# Patient Record
Sex: Male | Born: 1940 | Race: White | Hispanic: No | Marital: Married | Smoking: Former smoker
Health system: Southern US, Community
[De-identification: ages and names within clinical notes are randomized; demographics above are authoritative.]

## PROBLEM LIST (undated history)

## (undated) DIAGNOSIS — R569 Unspecified convulsions: Secondary | ICD-10-CM

## (undated) DIAGNOSIS — K635 Polyp of colon: Secondary | ICD-10-CM

## (undated) DIAGNOSIS — H409 Unspecified glaucoma: Secondary | ICD-10-CM

## (undated) DIAGNOSIS — M1990 Unspecified osteoarthritis, unspecified site: Secondary | ICD-10-CM

## (undated) DIAGNOSIS — T7840XA Allergy, unspecified, initial encounter: Secondary | ICD-10-CM

## (undated) DIAGNOSIS — C801 Malignant (primary) neoplasm, unspecified: Secondary | ICD-10-CM

## (undated) DIAGNOSIS — I10 Essential (primary) hypertension: Secondary | ICD-10-CM

## (undated) DIAGNOSIS — H269 Unspecified cataract: Secondary | ICD-10-CM

## (undated) DIAGNOSIS — K219 Gastro-esophageal reflux disease without esophagitis: Secondary | ICD-10-CM

## (undated) DIAGNOSIS — E785 Hyperlipidemia, unspecified: Secondary | ICD-10-CM

## (undated) DIAGNOSIS — M189 Osteoarthritis of first carpometacarpal joint, unspecified: Secondary | ICD-10-CM

## (undated) HISTORY — DX: Hyperlipidemia, unspecified: E78.5

## (undated) HISTORY — PX: COLONOSCOPY: SHX174

## (undated) HISTORY — DX: Unspecified glaucoma: H40.9

## (undated) HISTORY — PX: CHOLECYSTECTOMY: SHX55

## (undated) HISTORY — DX: Unspecified convulsions: R56.9

## (undated) HISTORY — DX: Polyp of colon: K63.5

## (undated) HISTORY — DX: Essential (primary) hypertension: I10

## (undated) HISTORY — DX: Unspecified osteoarthritis, unspecified site: M19.90

## (undated) HISTORY — DX: Gastro-esophageal reflux disease without esophagitis: K21.9

## (undated) HISTORY — PX: POLYPECTOMY: SHX149

## (undated) HISTORY — DX: Unspecified cataract: H26.9

## (undated) HISTORY — DX: Allergy, unspecified, initial encounter: T78.40XA

---

## 1999-10-30 ENCOUNTER — Ambulatory Visit (HOSPITAL_BASED_OUTPATIENT_CLINIC_OR_DEPARTMENT_OTHER): Admission: RE | Admit: 1999-10-30 | Discharge: 1999-10-30 | Payer: Self-pay | Admitting: Otolaryngology

## 2001-02-08 ENCOUNTER — Ambulatory Visit (HOSPITAL_COMMUNITY): Admission: RE | Admit: 2001-02-08 | Discharge: 2001-02-08 | Payer: Self-pay | Admitting: Gastroenterology

## 2001-03-16 HISTORY — PX: KNEE ARTHROSCOPY: SUR90

## 2011-11-17 ENCOUNTER — Encounter: Payer: Self-pay | Admitting: Gastroenterology

## 2012-01-05 ENCOUNTER — Ambulatory Visit (AMBULATORY_SURGERY_CENTER): Payer: Medicare Other | Admitting: *Deleted

## 2012-01-05 VITALS — Ht 69.0 in | Wt 180.0 lb

## 2012-01-05 DIAGNOSIS — Z1211 Encounter for screening for malignant neoplasm of colon: Secondary | ICD-10-CM

## 2012-01-05 MED ORDER — MOVIPREP 100 G PO SOLR: ORAL | Status: DC

## 2012-01-19 ENCOUNTER — Encounter: Payer: Self-pay | Admitting: Gastroenterology

## 2012-01-19 ENCOUNTER — Ambulatory Visit (AMBULATORY_SURGERY_CENTER): Payer: Medicare Other | Admitting: Gastroenterology

## 2012-01-19 VITALS — BP 138/72 | HR 54 | Temp 96.8°F | Resp 14 | Ht 69.0 in | Wt 180.0 lb

## 2012-01-19 DIAGNOSIS — Z1211 Encounter for screening for malignant neoplasm of colon: Secondary | ICD-10-CM

## 2012-01-19 DIAGNOSIS — Z86010 Personal history of colonic polyps: Secondary | ICD-10-CM

## 2012-01-19 MED ORDER — SODIUM CHLORIDE 0.9 % IV SOLN: 500.0000 mL | INTRAVENOUS | Status: DC

## 2012-01-19 NOTE — Op Note (Signed)
[facility] [address] [place], [address]   COLONOSCOPY PROCEDURE REPORT  PATIENT: Brian, Castillo.  MR#: [id number] BIRTHDATE: [birth year] , 71  yrs. old GENDER: Male ENDOSCOPIST: Meryl Dare, MD, [facility] REFERRED GN:FAOZ Timothy Lasso, M.D. 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 MEDICATIONS: MAC sedation, administered by CRNA and propofol (Diprivan) 120mg  IV DESCRIPTION OF PROCEDURE:   After the risks benefits and alternatives of the procedure were thoroughly explained, informed consent was obtained.  A digital rectal exam revealed no abnormalities of the rectum.   The LB [id number]  endoscope was introduced through the anus and advanced to the cecum, which was identified by both the appendix and ileocecal valve. No adverse events experienced.   The quality of the prep was good, using MoviPrep  The instrument was then slowly withdrawn as the colon was fully examined.   COLON FINDINGS: A normal appearing cecum, ileocecal valve, and appendiceal orifice were identified.  The ascending, hepatic flexure, transverse, splenic flexure, descending, sigmoid colon and rectum appeared unremarkable.  No polyps or cancers were seen. Retroflexed views revealed small internal hemorrhoids. The time to cecum=3 minutes 01 seconds.  Withdrawal time=11 minutes 30 seconds. The scope was withdrawn and the procedure completed.  COMPLICATIONS: There were no complications.  ENDOSCOPIC IMPRESSION: 1.  Normal colon 2.  Small internal hemorrhoids  RECOMMENDATIONS: 1.  Repeat Colonoscopy in 5 years.   eSigned:  Meryl Dare, MD, [facility] 01/19/2012 10:15 AM

## 2012-01-19 NOTE — Progress Notes (Signed)
Patient did not experience any of the following events: a burn prior to discharge; a fall within the facility; wrong site/side/patient/procedure/implant event; or a hospital transfer or hospital admission upon discharge from the facility. (G8907) Patient did not have preoperative order for IV antibiotic SSI prophylaxis. (G8918)  

## 2012-01-19 NOTE — Progress Notes (Signed)
NAC noted-VSS-Toleratted procedure well. Arrousable.

## 2012-01-19 NOTE — Patient Instructions (Addendum)
YOU HAD AN ENDOSCOPIC PROCEDURE TODAY AT THE [facility]: Refer to the procedure report that was given to you for any specific questions about what was found during the examination.  If the procedure report does not answer your questions, please call your gastroenterologist to clarify.  If you requested that your care partner not be given the details of your procedure findings, then the procedure report has been included in a sealed envelope for you to review at your convenience later.  YOU SHOULD EXPECT: Some feelings of bloating in the abdomen. Passage of more gas than usual.  Walking can help get rid of the air that was put into your GI tract during the procedure and reduce the bloating. If you had a lower endoscopy (such as a colonoscopy or flexible sigmoidoscopy) you may notice spotting of blood in your stool or on the toilet paper. If you underwent a bowel prep for your procedure, then you may not have a normal bowel movement for a few days.  DIET: Your first meal following the procedure should be a light meal and then it is ok to progress to your normal diet.  A half-sandwich or bowl of soup is an example of a good first meal.  Heavy or fried foods are harder to digest and may make you feel nauseous or bloated.  Likewise meals heavy in dairy and vegetables can cause extra gas to form and this can also increase the bloating.  Drink plenty of fluids but you should avoid alcoholic beverages for 24 hours.  ACTIVITY: Your care partner should take you home directly after the procedure.  You should plan to take it easy, moving slowly for the rest of the day.  You can resume normal activity the day after the procedure however you should NOT DRIVE or use heavy machinery for 24 hours (because of the sedation medicines used during the test).    SYMPTOMS TO REPORT IMMEDIATELY: A gastroenterologist can be reached at any hour.  During normal business hours, 8:30 AM to 5:00 PM Monday through Friday,  call [phone].  After hours and on weekends, please call the GI answering service at [phone] who will take a message and have the physician on call contact you.   Following lower endoscopy (colonoscopy or flexible sigmoidoscopy):  Excessive amounts of blood in the stool  Significant tenderness or worsening of abdominal pains  Swelling of the abdomen that is new, acute  Fever of 100F or higher  Following upper endoscopy (EGD)  Vomiting of blood or coffee ground material  New chest pain or pain under the shoulder blades  Painful or persistently difficult swallowing  New shortness of breath  Fever of 100F or higher  Black, tarry-looking stools  FOLLOW UP: If any biopsies were taken you will be contacted by phone or by letter within the next 1-3 weeks.  Call your gastroenterologist if you have not heard about the biopsies in 3 weeks.  Our staff will call the home number listed on your records the next business day following your procedure to check on you and address any questions or concerns that you may have at that time regarding the information given to you following your procedure. This is a courtesy call and so if there is no answer at the home number and we have not heard from you through the emergency physician on call, we will assume that you have returned to your regular daily activities without incident.  SIGNATURES/CONFIDENTIALITY: You and/or your care   partner have signed paperwork which will be entered into your electronic medical record.  These signatures attest to the fact that that the information above on your After Visit Summary has been reviewed and is understood.  Full responsibility of the confidentiality of this discharge information lies with you and/or your care-partner.  

## 2012-01-20 ENCOUNTER — Telehealth: Payer: Self-pay | Admitting: *Deleted

## 2012-01-20 NOTE — Telephone Encounter (Signed)
  Follow up Call-  Call back number 01/19/2012  Post procedure Call Back phone  # [phone]  Permission to leave phone message Yes     Patient questions:  Do you have a fever, pain , or abdominal swelling? no Pain Score  0 *  Have you tolerated food without any problems? yes  Have you been able to return to your normal activities? yes  Do you have any questions about your discharge instructions: Diet   no Medications  no Follow up visit  no  Do you have questions or concerns about your Care? no  Actions: * If pain score is 4 or above: No action needed, pain <4.

## 2014-08-22 ENCOUNTER — Encounter (HOSPITAL_BASED_OUTPATIENT_CLINIC_OR_DEPARTMENT_OTHER)
Admission: RE | Admit: 2014-08-22 | Discharge: 2014-08-22 | Disposition: A | Discharge status: Home / Self Care | Payer: Medicare Other | Source: Ambulatory Visit | Attending: Orthopedic Surgery | Admitting: Orthopedic Surgery

## 2014-08-22 ENCOUNTER — Encounter (HOSPITAL_BASED_OUTPATIENT_CLINIC_OR_DEPARTMENT_OTHER): Payer: Self-pay | Admitting: *Deleted

## 2014-08-22 DIAGNOSIS — M19049 Primary osteoarthritis, unspecified hand: Secondary | ICD-10-CM | POA: Insufficient documentation

## 2014-08-22 DIAGNOSIS — Z01818 Encounter for other preprocedural examination: Secondary | ICD-10-CM | POA: Insufficient documentation

## 2014-08-22 LAB — BASIC METABOLIC PANEL
ANION GAP: 4 — AB (ref 5–15)
BUN: 17 mg/dL (ref 6–20)
CHLORIDE: 105 mmol/L (ref 101–111)
CO2: 28 mmol/L (ref 22–32)
Calcium: 8.8 mg/dL — ABNORMAL LOW (ref 8.9–10.3)
Creatinine, Ser: 0.83 mg/dL (ref 0.61–1.24)
GFR calc Af Amer: >60 mL/min (ref >60)
GFR calc non Af Amer: >60 mL/min (ref >60)
GLUCOSE: 93 mg/dL (ref 65–99)
Potassium: 4.5 mmol/L (ref 3.5–5.1)
SODIUM: 137 mmol/L (ref 135–145)

## 2014-08-24 ENCOUNTER — Ambulatory Visit (HOSPITAL_BASED_OUTPATIENT_CLINIC_OR_DEPARTMENT_OTHER): Payer: Medicare Other | Admitting: Anesthesiology

## 2014-08-24 ENCOUNTER — Ambulatory Visit (HOSPITAL_BASED_OUTPATIENT_CLINIC_OR_DEPARTMENT_OTHER)
Admission: RE | Admit: 2014-08-24 | Discharge: 2014-08-25 | Disposition: A | Discharge status: Home / Self Care | Payer: Medicare Other | Source: Ambulatory Visit | Attending: Orthopedic Surgery | Admitting: Orthopedic Surgery

## 2014-08-24 ENCOUNTER — Encounter (HOSPITAL_BASED_OUTPATIENT_CLINIC_OR_DEPARTMENT_OTHER)
Admission: RE | Disposition: A | Discharge status: Home / Self Care | Payer: Self-pay | Source: Ambulatory Visit | Attending: Orthopedic Surgery

## 2014-08-24 ENCOUNTER — Encounter (HOSPITAL_BASED_OUTPATIENT_CLINIC_OR_DEPARTMENT_OTHER): Payer: Self-pay

## 2014-08-24 DIAGNOSIS — Z79899 Other long term (current) drug therapy: Secondary | ICD-10-CM | POA: Insufficient documentation

## 2014-08-24 DIAGNOSIS — M1811 Unilateral primary osteoarthritis of first carpometacarpal joint, right hand: Secondary | ICD-10-CM | POA: Diagnosis not present

## 2014-08-24 DIAGNOSIS — E785 Hyperlipidemia, unspecified: Secondary | ICD-10-CM | POA: Insufficient documentation

## 2014-08-24 DIAGNOSIS — I10 Essential (primary) hypertension: Secondary | ICD-10-CM | POA: Insufficient documentation

## 2014-08-24 DIAGNOSIS — Z888 Allergy status to other drugs, medicaments and biological substances status: Secondary | ICD-10-CM | POA: Insufficient documentation

## 2014-08-24 DIAGNOSIS — H409 Unspecified glaucoma: Secondary | ICD-10-CM | POA: Diagnosis not present

## 2014-08-24 DIAGNOSIS — Z7982 Long term (current) use of aspirin: Secondary | ICD-10-CM | POA: Insufficient documentation

## 2014-08-24 DIAGNOSIS — Z87891 Personal history of nicotine dependence: Secondary | ICD-10-CM | POA: Insufficient documentation

## 2014-08-24 DIAGNOSIS — M1810 Unilateral primary osteoarthritis of first carpometacarpal joint, unspecified hand: Secondary | ICD-10-CM | POA: Diagnosis present

## 2014-08-24 HISTORY — DX: Osteoarthritis of first carpometacarpal joint, unspecified: M18.9

## 2014-08-24 HISTORY — PX: CARPOMETACARPEL SUSPENSION PLASTY: SHX5005

## 2014-08-24 LAB — POCT HEMOGLOBIN-HEMACUE: HEMOGLOBIN: 14.1 g/dL (ref 13.0–17.0)

## 2014-08-24 SURGERY — CARPOMETACARPEL (CMC) SUSPENSION PLASTY
Anesthesia: Regional | Site: Thumb | Laterality: Right

## 2014-08-24 MED ORDER — CEFAZOLIN SODIUM-DEXTROSE 2-3 GM-% IV SOLR
2.0000 g | Freq: Three times a day (TID) | INTRAVENOUS | Status: DC
  Administered 2014-08-24: 2 g via INTRAVENOUS

## 2014-08-24 MED ORDER — OXYCODONE HCL 5 MG PO TABS: 10.0000 mg | ORAL_TABLET | ORAL | Status: DC | PRN

## 2014-08-24 MED ORDER — FENTANYL CITRATE (PF) 100 MCG/2ML IJ SOLN
INTRAMUSCULAR | Status: AC
  Filled 2014-08-24: qty 2

## 2014-08-24 MED ORDER — ALPRAZOLAM 0.5 MG PO TABS
0.5000 mg | ORAL_TABLET | Freq: Four times a day (QID) | ORAL | Status: DC | PRN
  Filled 2014-08-24: qty 2

## 2014-08-24 MED ORDER — LACTATED RINGERS IV SOLN
INTRAVENOUS | Status: DC
  Administered 2014-08-24 (×2): via INTRAVENOUS

## 2014-08-24 MED ORDER — METHOCARBAMOL 500 MG PO TABS
500.0000 mg | ORAL_TABLET | Freq: Four times a day (QID) | ORAL | Status: DC | PRN
  Administered 2014-08-25: 500 mg via ORAL
  Filled 2014-08-24: qty 1

## 2014-08-24 MED ORDER — VITAMIN C 500 MG PO TABS: 1000.0000 mg | ORAL_TABLET | Freq: Every day | ORAL | Status: DC

## 2014-08-24 MED ORDER — MIDAZOLAM HCL 2 MG/2ML IJ SOLN
INTRAMUSCULAR | Status: AC
  Filled 2014-08-24: qty 2

## 2014-08-24 MED ORDER — MORPHINE SULFATE 2 MG/ML IJ SOLN
1.0000 mg | INTRAMUSCULAR | Status: DC | PRN
  Administered 2014-08-25 (×2): 1 mg via INTRAVENOUS
  Filled 2014-08-24: qty 1

## 2014-08-24 MED ORDER — CEFAZOLIN SODIUM 1-5 GM-% IV SOLN
1.0000 g | Freq: Three times a day (TID) | INTRAVENOUS | Status: DC
  Administered 2014-08-24 – 2014-08-25 (×2): 1 g via INTRAVENOUS
  Filled 2014-08-24 (×2): qty 50

## 2014-08-24 MED ORDER — GLYCOPYRROLATE 0.2 MG/ML IJ SOLN: 0.2000 mg | Freq: Once | INTRAMUSCULAR | Status: DC | PRN

## 2014-08-24 MED ORDER — MIDAZOLAM HCL 2 MG/2ML IJ SOLN
1.0000 mg | INTRAMUSCULAR | Status: DC | PRN
  Administered 2014-08-24: 2 mg via INTRAVENOUS

## 2014-08-24 MED ORDER — PROPOFOL 10 MG/ML IV BOLUS
INTRAVENOUS | Status: AC
Start: 2014-08-24 — End: 2014-08-24
  Filled 2014-08-24: qty 20

## 2014-08-24 MED ORDER — FENTANYL CITRATE (PF) 100 MCG/2ML IJ SOLN
50.0000 ug | INTRAMUSCULAR | Status: DC | PRN
  Administered 2014-08-24: 100 ug via INTRAVENOUS
  Administered 2014-08-24: 25 ug via INTRAVENOUS

## 2014-08-24 MED ORDER — BUPIVACAINE HCL (PF) 0.25 % IJ SOLN
INTRAMUSCULAR | Status: AC
Start: 2014-08-24 — End: 2014-08-24
  Filled 2014-08-24: qty 90

## 2014-08-24 MED ORDER — METHOCARBAMOL 1000 MG/10ML IJ SOLN: 500.0000 mg | Freq: Four times a day (QID) | INTRAVENOUS | Status: DC | PRN

## 2014-08-24 MED ORDER — CEFAZOLIN SODIUM-DEXTROSE 2-3 GM-% IV SOLR
INTRAVENOUS | Status: AC
Start: 2014-08-24 — End: 2014-08-24
  Filled 2014-08-24: qty 50

## 2014-08-24 MED ORDER — LACTATED RINGERS IV SOLN
INTRAVENOUS | Status: DC
  Administered 2014-08-24 (×2): via INTRAVENOUS

## 2014-08-24 MED ORDER — PHENYTOIN SODIUM EXTENDED 100 MG PO CAPS: 200.0000 mg | ORAL_CAPSULE | Freq: Two times a day (BID) | ORAL | Status: DC

## 2014-08-24 MED ORDER — OXYCODONE HCL 5 MG PO TABS
5.0000 mg | ORAL_TABLET | ORAL | Status: DC | PRN
  Administered 2014-08-25: 5 mg via ORAL
  Administered 2014-08-25: 10 mg via ORAL
  Administered 2014-08-25: 5 mg via ORAL
  Filled 2014-08-24 (×2): qty 2
  Filled 2014-08-24: qty 1

## 2014-08-24 MED ORDER — PROPOFOL INFUSION 10 MG/ML OPTIME
INTRAVENOUS | Status: DC | PRN
  Administered 2014-08-24: 75 ug/kg/min via INTRAVENOUS

## 2014-08-24 MED ORDER — FAMOTIDINE 20 MG PO TABS: 20.0000 mg | ORAL_TABLET | Freq: Two times a day (BID) | ORAL | Status: DC | PRN

## 2014-08-24 MED ORDER — BUPIVACAINE-EPINEPHRINE (PF) 0.5% -1:200000 IJ SOLN
INTRAMUSCULAR | Status: DC | PRN
  Administered 2014-08-24: 30 mL via PERINEURAL

## 2014-08-24 MED ORDER — METHOCARBAMOL 500 MG PO TABS: 500.0000 mg | ORAL_TABLET | Freq: Four times a day (QID) | ORAL | Status: DC

## 2014-08-24 MED ORDER — LIDOCAINE HCL (CARDIAC) 20 MG/ML IV SOLN
INTRAVENOUS | Status: DC | PRN
  Administered 2014-08-24: 50 mg via INTRAVENOUS

## 2014-08-24 MED ORDER — LATANOPROST 0.005 % OP SOLN: 1.0000 [drp] | Freq: Every day | OPHTHALMIC | Status: DC

## 2014-08-24 MED ORDER — FENTANYL CITRATE (PF) 100 MCG/2ML IJ SOLN
INTRAMUSCULAR | Status: AC
Start: 2014-08-24 — End: 2014-08-24
  Filled 2014-08-24: qty 4

## 2014-08-24 SURGICAL SUPPLY — 72 items
ANCH SUT SWLK 15.8X3.5 (Anchor) ×1 IMPLANT
ANCHOR SUTBIO SWIVELK 3.5X15.8 (Anchor) ×1 IMPLANT
BANDAGE ELASTIC 3 VELCRO ST LF (GAUZE/BANDAGES/DRESSINGS) ×2 IMPLANT
BIT DRILL CANN 2.7X625 NONSTRL (BIT) ×1 IMPLANT
BIT DRILL CANN 3.5X160 QC (BIT) ×1 IMPLANT
BLADE CLIPPER SURG (BLADE) ×2 IMPLANT
BLADE SURG 15 STRL LF DISP TIS (BLADE) ×3 IMPLANT
BLADE SURG 15 STRL SS (BLADE) ×6
BNDG CONFORM 3 STRL LF (GAUZE/BANDAGES/DRESSINGS) ×2 IMPLANT
BNDG GAUZE ELAST 4 BULKY (GAUZE/BANDAGES/DRESSINGS) ×2 IMPLANT
BRUSH SCRUB EZ PLAIN DRY (MISCELLANEOUS) ×2 IMPLANT
CANISTER SUCT 1200ML W/VALVE (MISCELLANEOUS) ×2 IMPLANT
CORDS BIPOLAR (ELECTRODE) ×2 IMPLANT
COVER BACK TABLE 60X90IN (DRAPES) ×2 IMPLANT
COVER MAYO STAND STRL (DRAPES) ×2 IMPLANT
CUFF TOURNIQUET SINGLE 18IN (TOURNIQUET CUFF) ×1 IMPLANT
DECANTER SPIKE VIAL GLASS SM (MISCELLANEOUS) IMPLANT
DRAIN TLS ROUND 10FR (DRAIN) IMPLANT
DRAPE EXTREMITY T 121X128X90 (DRAPE) ×2 IMPLANT
DRAPE OEC MINIVIEW 54X84 (DRAPES) IMPLANT
DRAPE SURG 17X23 STRL (DRAPES) ×2 IMPLANT
DRSG EMULSION OIL 3X3 NADH (GAUZE/BANDAGES/DRESSINGS) ×2 IMPLANT
FIBERLOOP 2 0 (SUTURE) ×2 IMPLANT
GAUZE SPONGE 4X4 12PLY STRL (GAUZE/BANDAGES/DRESSINGS) ×2 IMPLANT
GAUZE SPONGE 4X4 16PLY XRAY LF (GAUZE/BANDAGES/DRESSINGS) IMPLANT
GLOVE BIO SURGEON STRL SZ 6.5 (GLOVE) ×2 IMPLANT
GLOVE BIOGEL M STRL SZ7.5 (GLOVE) ×2 IMPLANT
GLOVE BIOGEL PI IND STRL 7.0 (GLOVE) IMPLANT
GLOVE BIOGEL PI INDICATOR 7.0 (GLOVE) ×3
GLOVE SS BIOGEL STRL SZ 8 (GLOVE) ×1 IMPLANT
GLOVE SUPERSENSE BIOGEL SZ 8 (GLOVE) ×1
GOWN STRL REUS W/ TWL LRG LVL3 (GOWN DISPOSABLE) ×1 IMPLANT
GOWN STRL REUS W/ TWL XL LVL3 (GOWN DISPOSABLE) ×1 IMPLANT
GOWN STRL REUS W/TWL LRG LVL3 (GOWN DISPOSABLE) ×4
GOWN STRL REUS W/TWL XL LVL3 (GOWN DISPOSABLE) ×2
GUIDEWIRE THREADED 150MM (WIRE) ×2 IMPLANT
NDL HYPO 25X1 1.5 SAFETY (NEEDLE) ×1 IMPLANT
NEEDLE HYPO 22GX1.5 SAFETY (NEEDLE) IMPLANT
NEEDLE HYPO 25X1 1.5 SAFETY (NEEDLE) ×2 IMPLANT
NS IRRIG 1000ML POUR BTL (IV SOLUTION) ×2 IMPLANT
PACK BASIN DAY SURGERY FS (CUSTOM PROCEDURE TRAY) ×2 IMPLANT
PAD CAST 3X4 CTTN HI CHSV (CAST SUPPLIES) ×2 IMPLANT
PADDING CAST ABS 3INX4YD NS (CAST SUPPLIES) ×1
PADDING CAST ABS 4INX4YD NS (CAST SUPPLIES) ×1
PADDING CAST ABS COTTON 3X4 (CAST SUPPLIES) ×1 IMPLANT
PADDING CAST ABS COTTON 4X4 ST (CAST SUPPLIES) ×1 IMPLANT
PADDING CAST COTTON 3X4 STRL (CAST SUPPLIES) ×4
PASSER SUT SWANSON 36MM LOOP (INSTRUMENTS) ×1 IMPLANT
SHEET MEDIUM DRAPE 40X70 STRL (DRAPES) ×1 IMPLANT
SPLINT FIBERGLASS 3X35 (CAST SUPPLIES) ×1 IMPLANT
SPLINT PLASTER CAST XFAST 3X15 (CAST SUPPLIES) IMPLANT
SPLINT PLASTER XTRA FASTSET 3X (CAST SUPPLIES)
SPONGE SURGIFOAM ABS GEL 12-7 (HEMOSTASIS) IMPLANT
STOCKINETTE 4X48 STRL (DRAPES) ×2 IMPLANT
STOCKINETTE SYNTHETIC 3 UNSTER (CAST SUPPLIES) ×2 IMPLANT
SUCTION FRAZIER TIP 10 FR DISP (SUCTIONS) ×2 IMPLANT
SUT BONE WAX W31G (SUTURE) IMPLANT
SUT FIBERWIRE 3-0 18 TAPR NDL (SUTURE) ×4
SUT FIBERWIRE 4-0 18 TAPR NDL (SUTURE)
SUT PROLENE 4 0 PS 2 18 (SUTURE) ×4 IMPLANT
SUT VIC AB 4-0 P-3 18XBRD (SUTURE) IMPLANT
SUT VIC AB 4-0 P3 18 (SUTURE)
SUTURE FIBERWR 3-0 18 TAPR NDL (SUTURE) ×2 IMPLANT
SUTURE FIBERWR 4-0 18 TAPR NDL (SUTURE) IMPLANT
SYR BULB 3OZ (MISCELLANEOUS) ×2 IMPLANT
SYR CONTROL 10ML LL (SYRINGE) ×3 IMPLANT
TAPE SURG TRANSPORE 1 IN (GAUZE/BANDAGES/DRESSINGS) ×1 IMPLANT
TAPE SURGICAL TRANSPORE 1 IN (GAUZE/BANDAGES/DRESSINGS) ×1
TOWEL OR 17X24 6PK STRL BLUE (TOWEL DISPOSABLE) ×4 IMPLANT
TOWEL OR NON WOVEN STRL DISP B (DISPOSABLE) ×2 IMPLANT
TUBE CONNECTING 20X1/4 (TUBING) ×2 IMPLANT
UNDERPAD 30X30 (UNDERPADS AND DIAPERS) ×2 IMPLANT

## 2014-08-24 NOTE — Anesthesia Postprocedure Evaluation (Signed)
Anesthesia Post Note  Patient: Brian Castillo  Procedure(s) Performed: Procedure(s) (LRB): RIGHT CMC ARTHOPLASTY WITH DOUBLE TENDON TRANSFER AND REPAIR  RECONSTRUCTION  (Right)  Anesthesia type: regional  Patient location: PACU  Post pain: Pain level controlled  Post assessment: Patient's Cardiovascular Status Stable  Last Vitals:  Filed Vitals:   08/24/14 1030  BP: 138/64  Pulse: 52  Temp: 36.2 C  Resp: 14    Post vital signs: Reviewed and stable  Level of consciousness: awake  Complications: No apparent anesthesia complications

## 2014-08-24 NOTE — Op Note (Signed)
See dictation #[id number] Amedeo Plenty MD

## 2014-08-24 NOTE — Discharge Instructions (Signed)
Keep bandage clean and dry.  Call for any problems.  No smoking.  Criteria for driving a car: you should be off your pain medicine for 7-8 hours, able to drive one handed(confident), thinking clearly and feeling able in your judgement to drive. Continue elevation as it will decrease swelling.  If instructed by MD move your fingers within the confines of the bandage/splint.  Use ice if instructed by your MD. Call immediately for any sudden loss of feeling in your hand/arm or change in functional abilities of the extremity.We recommend that you to take vitamin C 1000 mg a day to promote healing. We also recommend that if you require  pain medicine that you take a stool softener to prevent constipation as most pain medicines will have constipation side effects. We recommend either Peri-Colace or Senokot and recommend that you also consider adding MiraLAX to prevent the constipation affects from pain medicine if you are required to use them. These medicines are over the counter and maybe purchased at a local pharmacy. A cup of yogurt and a probiotic can also be helpful during the recovery process as the medicines can disrupt your intestinal environment.

## 2014-08-24 NOTE — Progress Notes (Signed)
Assisted Dr. Ossey with right, ultrasound guided, supraclavicular block. Side rails up, monitors on throughout procedure. See vital signs in flow sheet. Tolerated Procedure well. 

## 2014-08-24 NOTE — Transfer of Care (Signed)
Immediate Anesthesia Transfer of Care Note  Patient: Brian Castillo  Procedure(s) Performed: Procedure(s): RIGHT CMC ARTHOPLASTY WITH DOUBLE TENDON TRANSFER AND REPAIR  RECONSTRUCTION  (Right)  Patient Location: PACU  Anesthesia Type:MAC combined with regional for post-op pain  Level of Consciousness: awake, alert  and oriented  Airway & Oxygen Therapy: Patient Spontanous Breathing and Patient connected to face mask oxygen  Post-op Assessment: Report given to RN and Post -op Vital signs reviewed and stable  Post vital signs: Reviewed and stable  Last Vitals:  Filed Vitals:   08/24/14 0735  BP: 120/54  Pulse: 57  Temp:   Resp: 15    Complications: No apparent anesthesia complications

## 2014-08-24 NOTE — H&P (Signed)
Brian Castillo is an 74 y.o. male.   Chief Complaint: right thumb pain HPI: Patient presents for right thumb reconstruction.  Patient notes no other complaints today.  Medical history is reviewed.  He is been seen consented and all questions answered. The graft he denies neck back chest or abdominal pain.    Past Medical History  Diagnosis Date  . Seizures     last seizure was in 1988  . Hypertension   . Hyperlipidemia   . Glaucoma   . Arthritis   . 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 Cholecystectomy    . Knee arthroscopy  2003    bilateral    History reviewed. No pertinent family history. Social History:  reports that he has quit smoking. He has never used smokeless tobacco. He reports that he drinks about 8.4 oz of alcohol per week. He reports that he does not use illicit drugs.  Allergies:  Allergies  Allergen Reactions  . Lisinopril Hives    Medications Prior to Admission  Medication Sig Dispense Refill  . acetaminophen (TYLENOL) 325 MG tablet Take 650 mg by mouth every 6 (six) hours as needed for moderate pain.    [name] amLODipine (NORVASC) 5 MG tablet Take 1 tablet by mouth Daily.    [name] aspirin 81 MG tablet Take 81 mg by mouth daily.    . calcium carbonate (OS-CAL) 600 MG TABS Take 600 mg by mouth daily.    [name] latanoprost (XALATAN) 0.005 % ophthalmic solution Place 1 drop into both eyes at bedtime.    . metoprolol succinate (TOPROL-XL) 25 MG 24 hr tablet Take 1 tablet by mouth Daily.    . phenytoin (DILANTIN) 200 MG ER capsule Take 200 mg by mouth 2 (two) times daily.    . pravastatin (PRAVACHOL) 80 MG tablet Take 1 tablet by mouth Daily.      Results for orders placed or performed during the hospital encounter of 08/24/14 (from the past 48 hour(s))  Basic metabolic panel     Status: Abnormal   Collection Time: 08/22/14 11:30 AM  Result Value Ref Range   Sodium 137 135 - 145 mmol/L   Potassium  4.5 3.5 - 5.1 mmol/L   Chloride 105 101 - 111 mmol/L   CO2 28 22 - 32 mmol/L   Glucose, Bld 93 65 - 99 mg/dL   BUN 17 6 - 20 mg/dL   Creatinine, Ser 0.83 0.61 - 1.24 mg/dL   Calcium 8.8 (L) 8.9 - 10.3 mg/dL   GFR calc non Af Amer >60 >60 mL/min   GFR calc Af Amer >60 >60 mL/min    Comment: (NOTE) The eGFR has been calculated using the CKD EPI equation. This calculation has not been validated in all clinical situations. eGFR's persistently <60 mL/min signify possible Chronic Kidney Disease.    Anion gap 4 (L) 5 - 15   No results found.  Review of Systems  Respiratory: Negative.   Gastrointestinal: Negative.   Genitourinary: Negative.   Neurological: Negative.   Psychiatric/Behavioral: Negative.     Blood pressure 122/51, pulse 57, temperature 97.8 F (36.6 C), temperature source Oral, resp. rate 15, height 5' 9"  (1.753 m), weight 78.472 kg (173 lb), SpO2 98 %. Physical Exam right thumb basal thumb joint arthritis with advanced degenerative change and loss of motion. He has chronic pain and deformity here with dorsolateral escape The patient is alert and  oriented in no acute distress. The patient complains of pain in the affected upper extremity.  The patient is noted to have a normal HEENT exam. Lung fields show equal chest expansion and no shortness of breath. Abdomen exam is nontender without distention. Lower extremity examination does not show any fracture dislocation or blood clot symptoms. Pelvis is stable and the neck and back are stable and nontender. Assessment/Plan We will plan for right thumb basilar thumb reconstruction with double tendon transfer and tenodesis as well as repair is necessary. I discussed with him risk and benefits. We are planning surgery for your upper extremity. The risk and benefits of surgery to include risk of bleeding, infection, anesthesia,  damage to normal structures and failure of the surgery to accomplish its intended goals of relieving  symptoms and restoring function have been discussed in detail. With this in mind we plan to proceed. I have specifically discussed with the patient the pre-and postoperative regime and the dos and don'ts and risk and benefits in great detail. Risk and benefits of surgery also include risk of dystrophy(CRPS), chronic nerve pain, failure of the healing process to go onto completion and other inherent risks of surgery The relavent the pathophysiology of the disease/injury process, as well as the alternatives for treatment and postoperative course of action has been discussed in great detail with the patient who desires to proceed.  We will do everything in our power to help you (the patient) restore function to the upper extremity. It is a pleasure to see this patient today.  Paulene Floor 08/24/2014, 7:38 AM

## 2014-08-24 NOTE — Anesthesia Procedure Notes (Signed)
Anesthesia Regional Block:  Supraclavicular block  Pre-Anesthetic Checklist: ,, timeout performed, Correct Patient, Correct Site, Correct Laterality, Correct Procedure, Correct Position, site marked, Risks and benefits discussed,  Surgical consent,  Pre-op evaluation,  At surgeon's request and post-op pain management  Laterality: Right  Prep: chloraprep       Needles:  Injection technique: Single-shot  Needle Type: Echogenic Stimulator Needle     Needle Length: 9cm 9 cm Needle Gauge: 21 and 21 G    Additional Needles:  Procedures: ultrasound guided (picture in chart) and nerve stimulator Supraclavicular block  Nerve Stimulator or Paresthesia:  Response: 0.4 mA,   Additional Responses:   Narrative:  Start time: 08/24/2014 7:20 AM End time: 08/24/2014 7:30 AM Injection made incrementally with aspirations every 5 mL.  Performed by: Personally  Anesthesiologist: Lillia Abed  Additional Notes: Monitors applied. Patient sedated. Sterile prep and drape,hand hygiene and sterile gloves were used. Relevant anatomy identified.Needle position confirmed.Local anesthetic injected incrementally after negative aspiration. Local anesthetic spread visualized around nerve(s). Vascular puncture avoided. No complications. Image printed for medical record.The patient tolerated the procedure well.

## 2014-08-24 NOTE — Anesthesia Preprocedure Evaluation (Signed)
Anesthesia Evaluation  Patient identified by MRN, date of birth, ID band Patient awake    Reviewed: Allergy & Precautions, NPO status , Patient's Chart, lab work & pertinent test results  Airway Mallampati: I  TM Distance: >3 FB Neck ROM: Full    Dental   Pulmonary former smoker,    Pulmonary exam normal       Cardiovascular hypertension, Pt. on medications Normal cardiovascular exam    Neuro/Psych    GI/Hepatic   Endo/Other    Renal/GU      Musculoskeletal   Abdominal   Peds  Hematology   Anesthesia Other Findings   Reproductive/Obstetrics                             Anesthesia Physical Anesthesia Plan  ASA: II  Anesthesia Plan: Regional   Post-op Pain Management:    Induction: Intravenous  Airway Management Planned: Simple Face Mask  Additional Equipment:   Intra-op Plan:   Post-operative Plan:   Informed Consent: I have reviewed the patients History and Physical, chart, labs and discussed the procedure including the risks, benefits and alternatives for the proposed anesthesia with the patient or authorized representative who has indicated his/her understanding and acceptance.     Plan Discussed with: CRNA and Surgeon  Anesthesia Plan Comments:         Anesthesia Quick Evaluation

## 2014-08-25 DIAGNOSIS — M1811 Unilateral primary osteoarthritis of first carpometacarpal joint, right hand: Secondary | ICD-10-CM | POA: Diagnosis not present

## 2014-08-25 NOTE — Discharge Summary (Signed)
  Patient has been seen and examined. Patient has pain appropriate to his injury/process. Patient denies new complaints at this present time. I have discussed the care pathway with nursing staff. Patient is appropriate and alert.  We reviewed vital signs and intake output which are stable.  The upper extremity is neurovascularly intact. Refill is normal. There is no signs of compartment syndrome. There is no signs of dystrophy. There is normal sensation.  I have spent a  great deal of time discussing range of motion edema control and other techniques to decrease edema and promote flexion extension of the fingers. Patient understands the importance of elevation range of motion massage and other measures to lessen pain and prevent swelling.  We have also discussed immobilization to appropriate areas involved.  We have discussed with the patient shoulder range of motion to prevent adhesive capsulitis.  The remainder of the examination is normal today without complicating feature.    Patient will be discharged home. Will plan to see the patient back in the office as per discharge instructions (please see discharge instructions).  Patient had an uneventful hospital course. At the time of discharge patient is stable awake alert and oriented in no acute distress. Regular diet will be continued and has been tolerated. Patient will notify should have problems occur. There is no signs of DVT infection or other complication at this juncture.  All questions have been incurred and answered.  Please see discharge med list   final diagnosis status post right thumb basilar joint reconstruction   Notnamed Croucher MD

## 2014-08-27 NOTE — Op Note (Signed)
NAMESHARROD, Brian Castillo NO.:  [id number]  MEDICAL RECORD NO.:  [id number]  LOCATION:                               FACILITY:  [facility]  PHYSICIAN:  Brian Castillo. 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  POSTOPERATIVE DIAGNOSIS:  Right thumb carpometacarpal degenerative joint disease with failure of conservative management and end-stage collapse.  PROCEDURES: 1. Right thumb arthroplasty (removal of the trapezium at the basilar     thumb joint secondary to chronic long-standing carpometacarpal     arthritis), right basilar thumb joint. 2. Abductor pollicis longus, 6/2EZ proper portion tendon transfer to     the first metacarpal, flexor carpi radialis and back to itself and     the first metacarpal secured with FiberWire and a Bio-Tenodesis     screw (Zancolli tendon transfer). 3. Abductor pollicis longus, digastric portion tendon transfer to the     flexor carpi radialis, abductor pollicis longus proper and back     upon themselves with multiple figure-of-eight throws (Welby tendon     transfer) right basilar thumb joint. 4. Abductor pollicis longus tenodesis (shortening of wrist extensor at     wrist forearm level to prevent dorsolateral escape), right     wrist/basilar thumb level.  SURGEON:  Brian Castillo. Brian Castillo, M.D.  ASSISTANT:  None.  COMPLICATIONS:  None.  ANESTHESIA:  Block anesthesia with IV sedation.  TOURNIQUET TIME:  Less than an hour.  INDICATIONS:  Brian Castillo presents with the above-mentioned diagnosis. I have counseled him in regard to risks and benefits of surgery including risk of infection, bleeding, anesthesia, damage to normal structures, and failure of surgery to accomplish its intended  goals of relieving symptoms and restoring function.  With this in mind, he desires to proceed.  OPERATION IN DETAIL:  The patient was seen by myself and Anesthesia, taken to the operative theater and underwent a smooth induction of IV sedation.  Preoperative antibiotics in form of Ancef was given 2 g.  Pre and postop check was complete.  Time-out called.  Correct extremity to be operated on was identified.  Following this, he was prepped and draped in usual sterile fashion.  Betadine scrub and paint.  Arm was elevated, tourniquet was insufflated to 250 mmHg and a dorsal radial incision was made about the thumb, identified the superficial radial nerve, swept this out of harm's way.  I identified the radial artery and carefully retracted it and kept it in mind at all times during the case. EPB and APL sheaths were incised.  Capsule was incised.  Piecemeal excision of the trapezium ensued followed by tenolysis, tenosynovectomy of the FCR tendon.  Following this, drill hole dorsal to palmar was made to the metacarpal, exiting intra-articularly in line with the palmar beak ligament.  The patient tolerated this well and there were no complicating features.  This completed the arthroplasty portion of the procedure.  I should note that there were some mild changes against the trapezoid  and scaphoid joint and I did place a drill hole for chondroplasty in this region.  The patient tolerated this well.  I did not choose to perform a partial resection of the trapezoid as there was still some significant cartilaginous integrity on the trapezoid.  At this time, I then turned attention towards the distal dorsal third of the forearm, made a counter incision, dissected down, and harvested an APL digastric portion and 1/3rd proper portion of the APL.  These were retrieved distally.  Fiber loop was placed around the APL proper, it was then placed against the metacarpal through the drill hole dorsal  to palmar around the FCR 2.5 three times and then back to itself and into the metacarpal drill hole, again secured with Bio-Tenodesis screw and FiberWire of the 3.0 variety.  This completed the Zancolli tendon transfer.  Following this, I performed APL digastric portion tendon transfer to the FCR back upon the APL proper and with multiple figure-of-eight loops, weaved the tendon apparatus.  I inset this with a FiberWire stitch of the 3.0 variety and this completed the Welby tendon transfer.  Following this, we performed APL tenodesis.  This was a shortening of wrist extensor to wrist forearm level to prevent dorsolateral escape which the patient tolerated quite nicely.  Once this was complete, we irrigated copiously and I performed a complex capsular closure.  The thumb looked excellently suspended.  I was quite pleased this and the findings.  There were no complicating features.  Tourniquet was deflated.  Irrigation applied during multiple points and times during the procedure and all looked quite well.  He was closed with Prolene. Hemostasis was excellent.  Dressed with Adaptic, Xeroform, and our standard postop thumb spica splint for Zancolli arthroplasty type procedure.  He will be monitored overnight, discharged home in the morning.  Should any problems occur, I will be immediately available.  These notes have been discussed.  We will rehab him according to standard basilar thumb joint replacement protocol.     Brian Castillo. Brian Castillo, M.D.   ______________________________ Brian Castillo. Brian Castillo, M.D.    [facility]  D:  08/24/2014  T:  08/24/2014  Job:  [id number]

## 2014-08-28 ENCOUNTER — Encounter (HOSPITAL_BASED_OUTPATIENT_CLINIC_OR_DEPARTMENT_OTHER): Payer: Self-pay | Admitting: Orthopedic Surgery

## 2015-01-28 ENCOUNTER — Encounter (HOSPITAL_COMMUNITY): Payer: Self-pay

## 2015-01-28 ENCOUNTER — Emergency Department (HOSPITAL_COMMUNITY)
Admission: EM | Admit: 2015-01-28 | Discharge: 2015-01-28 | Disposition: A | Discharge status: Home / Self Care | Payer: Medicare Other | Source: Home / Self Care

## 2015-01-28 ENCOUNTER — Emergency Department (INDEPENDENT_AMBULATORY_CARE_PROVIDER_SITE_OTHER): Payer: Medicare Other

## 2015-01-28 DIAGNOSIS — S91209A Unspecified open wound of unspecified toe(s) with damage to nail, initial encounter: Secondary | ICD-10-CM | POA: Diagnosis not present

## 2015-01-28 MED ORDER — LIDOCAINE HCL 2 % IJ SOLN
INTRAMUSCULAR | Status: AC
  Filled 2015-01-28: qty 20

## 2015-01-28 NOTE — Discharge Instructions (Signed)
Fingernail or Toenail Removal Fingernail or toenail removal is a surgical procedure to take off a nail from your finger or your toe. You may need to have a fingernail or toenail removed if it has an abnormal shape (deformity) or if it is severely injured. A fingernail or toenail may also be removed due to a bacterial infection, a severe ingrown toenail, or a fungal infection that has failed treatment with antifungal medicines. LET [facility] CARE PROVIDER KNOW ABOUT:  Any allergies you have.  All medicines you are taking, including vitamins, herbs, eye drops, creams, and over-the-counter medicines.  Previous problems you or members of your family have had with the use of anesthetics.  Any blood disorders you have.  Previous surgeries you have had.  Any medical conditions you may have. RISKS AND COMPLICATIONS Generally, this is a safe procedure. However, problems may occur, including:  Pain.  Bleeding.  Infection.  Regrowth of a deformed nail. BEFORE THE PROCEDURE  Ask your health care provider about changing or stopping your regular medicines. This is especially important if you are taking diabetes medicines or blood thinners.  Follow instructions from your health care provider about eating or drinking restrictions.  Plan to have someone take you home after the procedure. PROCEDURE  An IV tube will be inserted into one of your veins.  You will be given one or more of the following:  A medicine that helps you relax (sedative).  A medicine that numbs the area (local anesthetic).  After your toe or finger is numb, your health care provider will insert a blunt instrument under your nail to lift it up.  In some cases, your health care provider may also make a cut (incision) in your nail.  After your nail is lifted away from your toe or finger, your health care provider will detach it from your nail bed.  A germ-killing bandage (antiseptic dressing) will be put on your toe  or finger. The procedure may vary among health care providers and hospitals. AFTER THE PROCEDURE  Your blood pressure, heart rate, breathing rate, and blood oxygen level will be monitored often until the medicines you were given have worn off.  It is common to have some pain after nail removal. You will be given pain medicine as needed.  You may be given a prescription for pain medicine and antibiotic medicine.  If you had a toenail removed, you will be given a surgical shoe to wear while you recover.  If you had a fingernail removed, you may be given a finger splint to wear while you recover.   This information is not intended to replace advice given to you by your health care provider. Make sure you discuss any questions you have with your health care provider.   Document Released: 11/29/2002 Document Revised: 07/17/2014 Document Reviewed: 02/28/2014 Elsevier Interactive Patient Education [employer].

## 2015-01-28 NOTE — ED Notes (Signed)
Advised to keep foot elevated as much as poss, keep nail bed clean and covered as long as still moist or draining ,OTC med of choice for pain, begin soaks warm soapy/salt water in 2 days TID . Call or return if problems. Covered w non adherent dressing, sterile wrap

## 2015-01-28 NOTE — ED Provider Notes (Addendum)
CSN: [id number]     Arrival date & time 01/28/15  1323 History   None    Chief Complaint  Patient presents with  . Nail Problem   (Consider location/radiation/quality/duration/timing/severity/associated sxs/prior Treatment) HPI History obtained from patient:   LOCATION: Left great toe SEVERITY: Minimal pain DURATION: About one week ago CONTEXT: Dropped 2x4 wood board onto foot QUALITY: MODIFYING FACTORS: Was seen by primary care provider today and advised him to come to urgent care for toenail removal as they could not give him appointment with podiatry ASSOCIATED SYMPTOMS: None TIMING: Constant OCCUPATION: Retired IT  Past Medical History  Diagnosis Date  . Seizures ([place])     last seizure was in 1988  . Hypertension   . Hyperlipidemia   . Glaucoma   . Arthritis   . 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 Cholecystectomy    . Knee arthroscopy  2003    bilateral  . Carpometacarpel suspension plasty Right 08/24/2014    Procedure: RIGHT CMC ARTHOPLASTY WITH DOUBLE TENDON TRANSFER AND REPAIR  RECONSTRUCTION ;  Surgeon: Roseanne Kaufman, MD;  Location: [place];  Service: Orthopedics;  Laterality: Right;   History reviewed. No pertinent family history. Social History  Substance Use Topics  . Smoking status: Former Research scientist (life sciences)  . Smokeless tobacco: Never Used  . Alcohol Use: 8.4 oz/week    14 Glasses of wine per week     Comment: social    Review of Systems ROS +'ve left great toe injury  Denies: HEADACHE, NAUSEA, ABDOMINAL PAIN, CHEST PAIN, CONGESTION, DYSURIA, SHORTNESS OF BREATH  Allergies  Lisinopril  Home Medications   Prior to Admission medications   Medication Sig Start Date End Date Taking? Authorizing Provider  acetaminophen (TYLENOL) 325 MG tablet Take 650 mg by mouth every 6 (six) hours as needed for moderate pain.    Historical Provider, MD  amLODipine (NORVASC) 5 MG tablet Take  1 tablet by mouth Daily. 11/28/11   Historical Provider, MD  aspirin 81 MG tablet Take 81 mg by mouth daily.    Historical Provider, MD  calcium carbonate (OS-CAL) 600 MG TABS Take 600 mg by mouth daily.    Historical Provider, MD  latanoprost (XALATAN) 0.005 % ophthalmic solution Place 1 drop into both eyes at bedtime.    Historical Provider, MD  methocarbamol (ROBAXIN) 500 MG tablet Take 1 tablet (500 mg total) by mouth 4 (four) times daily. 08/24/14   Roseanne Kaufman, MD  metoprolol succinate (TOPROL-XL) 25 MG 24 hr tablet Take 1 tablet by mouth Daily. 11/28/11   Historical Provider, MD  oxyCODONE (OXY IR/ROXICODONE) 5 MG immediate release tablet Take 2 tablets (10 mg total) by mouth every 4 (four) hours as needed for severe pain. 08/24/14   Roseanne Kaufman, MD  phenytoin (DILANTIN) 200 MG ER capsule Take 200 mg by mouth 2 (two) times daily.    Historical Provider, MD  pravastatin (PRAVACHOL) 80 MG tablet Take 1 tablet by mouth Daily. 11/28/11   Historical Provider, MD   Meds Ordered and Administered this Visit  Medications - No data to display  BP 177/67 mmHg  Pulse 59  Temp(Src) 98.1 F (36.7 C) (Oral)  Resp 20  SpO2 98% No data found.   Physical Exam  Constitutional: He is oriented to person, place, and time. He appears well-developed and well-nourished.  HENT:  Head: Normocephalic and atraumatic.  Pulmonary/Chest: Effort normal.  Musculoskeletal: He exhibits  tenderness.  Neurological: He is alert and oriented to person, place, and time.  Skin: Skin is warm and dry.  Psychiatric: He has a normal mood and affect. His behavior is normal. Judgment and thought content normal.  Nursing note and vitals reviewed.   ED Course  .Nail Removal Date/Time: 01/28/2015 5:13 PM Performed by: Konrad Felix Authorized by: Linde Gillis C Consent: Verbal consent obtained. Risks and benefits: risks, benefits and alternatives were discussed Consent given by: patient Patient identity  confirmed: verbally with patient and arm band Time out: Immediately prior to procedure a "time out" was called to verify the correct patient, procedure, equipment, support staff and site/side marked as required. Anesthesia: local infiltration Local anesthetic: lidocaine 2% without epinephrine and bupivacaine 0.5% without epinephrine Anesthetic total: 3 ml Patient sedated: no Preparation: skin prepped with alcohol Amount removed: complete Wedge excision of skin of nail fold: no Nail bed sutured: no Nail matrix removed: none Removed nail replaced and anchored: no Dressing: antibiotic ointment Patient tolerance: Patient tolerated the procedure well with no immediate complications   (including critical care time)  Labs Review Labs Reviewed - No data to display  Imaging Review Dg Toe Great Left  01/28/2015  CLINICAL DATA:  Initial encounter for Pt dropped a 2x4 on top of his great toe about 2 weeks ago, toe is still red and swollen, pain EXAM: LEFT GREAT TOE COMPARISON:  None. FINDINGS: Mild soft tissue swelling, without acute fracture or dislocation. IMPRESSION: No acute osseous abnormality. Electronically Signed   By: Abigail Miyamoto M.D.   On: 01/28/2015 15:51     Visual Acuity Review  Right Eye Distance:   Left Eye Distance:   Bilateral Distance:    Right Eye Near:   Left Eye Near:    Bilateral Near:         MDM   1. Nail avulsion of toe, initial encounter    Review of x-ray with patient no fracture noted. Toenail was removed under sterile conditions without difficulty. Instructions were Provided to the patient discharged home in stable condition I also explained to patient and there is no indication for antibiotics at this time.  THIS NOTE WAS GENERATED USING A VOICE RECOGNITION SOFTWARE PROGRAM. ALL REASONABLE EFFORTS  WERE MADE TO PROOFREAD THIS DOCUMENT FOR ACCURACY.     Konrad Felix, PA 01/28/15 [place], [place] 02/11/15 1321

## 2015-01-28 NOTE — ED Notes (Signed)
States he dropped board on great toe left foot 1 week ago, now nail discolored and draining

## 2015-12-26 ENCOUNTER — Other Ambulatory Visit: Payer: Self-pay | Admitting: Orthopedic Surgery

## 2016-03-06 ENCOUNTER — Encounter (HOSPITAL_COMMUNITY): Payer: Self-pay | Admitting: Emergency Medicine

## 2016-03-06 ENCOUNTER — Ambulatory Visit (HOSPITAL_COMMUNITY)
Admission: EM | Admit: 2016-03-06 | Discharge: 2016-03-06 | Disposition: A | Discharge status: Home / Self Care | Payer: Medicare Other | Attending: Family Medicine | Admitting: Family Medicine

## 2016-03-06 DIAGNOSIS — N139 Obstructive and reflux uropathy, unspecified: Secondary | ICD-10-CM

## 2016-03-06 LAB — POCT URINALYSIS DIP (DEVICE)
BILIRUBIN URINE: NEGATIVE
GLUCOSE, UA: NEGATIVE mg/dL
Hgb urine dipstick: NEGATIVE
Ketones, ur: NEGATIVE mg/dL
LEUKOCYTES UA: NEGATIVE
NITRITE: NEGATIVE
Protein, ur: NEGATIVE mg/dL
Specific Gravity, Urine: 1.015 (ref 1.005–1.030)
Urobilinogen, UA: 0.2 mg/dL (ref 0.0–1.0)
pH: 6 (ref 5.0–8.0)

## 2016-03-06 MED ORDER — TAMSULOSIN HCL 0.4 MG PO CAPS: 0.4000 mg | ORAL_CAPSULE | Freq: Every day | ORAL | 1 refills | Status: DC

## 2016-03-06 MED ORDER — CIPROFLOXACIN HCL 500 MG PO TABS: 500.0000 mg | ORAL_TABLET | Freq: Two times a day (BID) | ORAL | 0 refills | Status: DC

## 2016-03-06 NOTE — ED Provider Notes (Signed)
[name]    CSN: [id number] Arrival date & time: 03/06/16  1116     History   Chief Complaint Chief Complaint  Patient presents with  . Urinary Tract Infection    HPI Brian Castillo is a 75 y.o. male.   PT spoke to his Dr. This morning about possible urinary retention and UTI. PT reports burning with urination for 2 days associated right flank pain. PT reports he is urinating less than normal and it is harder to expell urine. Dr. Virgina Jock wanted him to have a bladder scan.  Patient has seen Dr. Alinda Money in the past for urological problems, bladder infection.   Fax notes: Dr Louis Meckel  Attn: Mickel Baas  [phone] For appt 3 pm today        Past Medical History:  Diagnosis Date  . Arthritis   . Glaucoma   . Hyperlipidemia   . Hypertension   . 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 Seizures ([place])    last seizure was in 1988    Patient Active Problem List   Diagnosis Date Noted  . 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/24/2014    Past Surgical History:  Procedure Laterality Date  . CARPOMETACARPEL SUSPENSION PLASTY Right 08/24/2014   Procedure: RIGHT CMC ARTHOPLASTY WITH DOUBLE TENDON TRANSFER AND REPAIR  RECONSTRUCTION ;  Surgeon: Roseanne Kaufman, MD;  Location: [place];  Service: Orthopedics;  Laterality: Right;  . CHOLECYSTECTOMY    . KNEE ARTHROSCOPY  2003   bilateral       Home Medications    Prior to Admission medications   Medication Sig Start Date End Date Taking? Authorizing Provider  acetaminophen (TYLENOL) 325 MG tablet Take 650 mg by mouth every 6 (six) hours as needed for moderate pain.    Historical Provider, MD  amLODipine (NORVASC) 5 MG tablet Take 1 tablet by mouth Daily. 11/28/11   Historical Provider, MD  aspirin 81 MG tablet Take 81 mg by mouth daily.    Historical Provider, MD  calcium carbonate (OS-CAL) 600 MG TABS Take 600 mg by mouth daily.    Historical Provider, MD  ciprofloxacin (CIPRO) 500 MG  tablet Take 1 tablet (500 mg total) by mouth 2 (two) times daily. 03/06/16   Robyn Haber, MD  latanoprost (XALATAN) 0.005 % ophthalmic solution Place 1 drop into both eyes at bedtime.    Historical Provider, MD  methocarbamol (ROBAXIN) 500 MG tablet Take 1 tablet (500 mg total) by mouth 4 (four) times daily. 08/24/14   Roseanne Kaufman, MD  metoprolol succinate (TOPROL-XL) 25 MG 24 hr tablet Take 1 tablet by mouth Daily. 11/28/11   Historical Provider, MD  oxyCODONE (OXY IR/ROXICODONE) 5 MG immediate release tablet Take 2 tablets (10 mg total) by mouth every 4 (four) hours as needed for severe pain. 08/24/14   Roseanne Kaufman, MD  phenytoin (DILANTIN) 200 MG ER capsule Take 200 mg by mouth 2 (two) times daily.    Historical Provider, MD  pravastatin (PRAVACHOL) 80 MG tablet Take 1 tablet by mouth Daily. 11/28/11   Historical Provider, MD  tamsulosin (FLOMAX) 0.4 MG CAPS capsule Take 1 capsule (0.4 mg total) by mouth daily. 03/06/16   Robyn Haber, MD    Family History No family history on file.  Social History Social History  Substance Use Topics  . Smoking status: Former Research scientist (life sciences)  . Smokeless tobacco: Never Used     Comment: quit 1984  . Alcohol use 8.4 oz/week  14 Glasses of wine per week     Allergies   Lisinopril   Review of Systems Review of Systems  Constitutional: Negative.   HENT: Negative.   Respiratory: Negative.   Cardiovascular: Negative.   Gastrointestinal: Positive for abdominal pain.  Genitourinary: Positive for decreased urine volume and dysuria.  Musculoskeletal: Negative.   Neurological: Negative.      Physical Exam Triage Vital Signs ED Triage Vitals  Enc Vitals Group     BP 03/06/16 1225 147/71     Pulse Rate 03/06/16 1225 (!) 52     Resp 03/06/16 1225 16     Temp 03/06/16 1225 98.1 F (36.7 C)     Temp Source 03/06/16 1225 Oral     SpO2 03/06/16 1225 100 %     Weight 03/06/16 1225 178 lb (80.7 kg)     Height 03/06/16 1225 5\' 9"  (1.753 m)      Head Circumference --      Peak Flow --      Pain Score 03/06/16 1227 4     Pain Loc --      Pain Edu? --      Excl. in [place]? --    No data found.   Updated Vital Signs BP 147/71   Pulse (!) 52   Temp 98.1 F (36.7 C) (Oral)   Resp 16   Ht 5\' 9"  (1.753 m)   Wt 178 lb (80.7 kg)   SpO2 100%   BMI 26.29 kg/m    Physical Exam  Constitutional: He appears well-developed and well-nourished.  HENT:  Right Ear: External ear normal.  Left Ear: External ear normal.  Mouth/Throat: Oropharynx is clear and moist.  Eyes: Conjunctivae and EOM are normal. Pupils are equal, round, and reactive to light.  Neck: Normal range of motion. Neck supple.  Abdominal: Soft. There is tenderness. There is guarding.  Nursing note and vitals reviewed.    UC Treatments / Results  Labs (all labs ordered are listed, but only abnormal results are displayed) Labs Reviewed - No data to display  EKG  EKG Interpretation None       Radiology No results found.  Procedures Procedures (including critical care time)  Medications Ordered in UC Medications - No data to display   Initial Impression / Assessment and Plan / UC Course  I have reviewed the triage vital signs and the nursing notes.  Pertinent labs & imaging results that were available during my care of the patient were reviewed by me and considered in my medical decision making (see chart for details).  Clinical Course     Final Clinical Impressions(s) / UC Diagnoses   Final diagnoses:  Urinary obstruction    New Prescriptions New Prescriptions   CIPROFLOXACIN (CIPRO) 500 MG TABLET    Take 1 tablet (500 mg total) by mouth 2 (two) times daily.   TAMSULOSIN (FLOMAX) 0.4 MG CAPS CAPSULE    Take 1 capsule (0.4 mg total) by mouth daily.     Robyn Haber, MD 03/06/16 1250

## 2016-03-06 NOTE — ED Triage Notes (Signed)
PT spoke to his Dr. This morning about possible urinary retention and UTI. PT reports burning with urination for 2 days. PT also reports right flank pain. PT reports he is urinating less than normal and it is harder to expell urine. PT's MD wanted him to have a bladder scan

## 2016-03-06 NOTE — Discharge Instructions (Signed)
You have an appointment today at 3 PM with Dr. Louis Meckel at [facility] urology next [facility] on [address].

## 2016-03-06 NOTE — ED Notes (Signed)
EDP at bedside  

## 2017-01-27 ENCOUNTER — Encounter: Payer: Self-pay | Admitting: Gastroenterology

## 2017-03-18 ENCOUNTER — Ambulatory Visit (AMBULATORY_SURGERY_CENTER): Payer: Self-pay | Admitting: *Deleted

## 2017-03-18 ENCOUNTER — Other Ambulatory Visit: Payer: Self-pay

## 2017-03-18 VITALS — Ht 68.0 in | Wt 178.0 lb

## 2017-03-18 DIAGNOSIS — Z86010 Personal history of colonic polyps: Secondary | ICD-10-CM

## 2017-03-18 MED ORDER — NA SULFATE-K SULFATE-MG SULF 17.5-3.13-1.6 GM/177ML PO SOLN
1.0000 | Freq: Once | ORAL | 0 refills | Status: DC
Start: 2017-03-18 — End: 2017-03-18

## 2017-03-18 MED ORDER — PEG-KCL-NACL-NASULF-NA ASC-C 140 G PO SOLR: 1.0000 | ORAL | 0 refills | Status: DC

## 2017-03-18 MED ORDER — NA SULFATE-K SULFATE-MG SULF 17.5-3.13-1.6 GM/177ML PO SOLN: 1.0000 | Freq: Once | ORAL | 0 refills | Status: AC

## 2017-03-18 NOTE — Progress Notes (Signed)
No egg or soy allergy known to patient  No issues with past sedation with any surgeries  or procedures, no intubation problems  No diet pills per patient No home 02 use per patient  No blood thinners per patient  Pt denies issues with constipation  No A fib or A flutter  EMMI video sent to pt's e mail - pt declined  [facility] - [facility] is [facility] pt ok with that and placed order for suprep

## 2017-03-24 ENCOUNTER — Ambulatory Visit (AMBULATORY_SURGERY_CENTER): Payer: Medicare Other | Admitting: Gastroenterology

## 2017-03-24 ENCOUNTER — Encounter: Payer: Self-pay | Admitting: Gastroenterology

## 2017-03-24 ENCOUNTER — Other Ambulatory Visit: Payer: Self-pay

## 2017-03-24 VITALS — BP 154/77 | HR 53 | Temp 96.8°F | Resp 16 | Ht 69.0 in | Wt 176.0 lb

## 2017-03-24 DIAGNOSIS — Z86010 Personal history of colonic polyps: Secondary | ICD-10-CM | POA: Diagnosis not present

## 2017-03-24 DIAGNOSIS — D122 Benign neoplasm of ascending colon: Secondary | ICD-10-CM | POA: Diagnosis not present

## 2017-03-24 MED ORDER — SODIUM CHLORIDE 0.9 % IV SOLN: 500.0000 mL | INTRAVENOUS | Status: DC

## 2017-03-24 NOTE — Progress Notes (Signed)
Report to PACU, RN, vss, BBS= Clear.  

## 2017-03-24 NOTE — Op Note (Signed)
[place] Patient Name: Brian Castillo Procedure Date: 03/24/2017 9:07 AM MRN: [id number] Endoscopist: Ladene Artist , MD Age: 77 Referring MD:  Date of Birth: 16-Aug-1940 Gender: Male Account #: [id number] Procedure:                Colonoscopy Indications:              Surveillance: Personal history of adenomatous                            polyps on last colonoscopy 5 years ago Medicines:                Monitored Anesthesia Care Procedure:                Pre-Anesthesia Assessment:                           - Prior to the procedure, a History and Physical                            was performed, and patient medications and                            allergies were reviewed. The patient's tolerance of                            previous anesthesia was also reviewed. The risks                            and benefits of the procedure and the sedation                            options and risks were discussed with the patient.                            All questions were answered, and informed consent                            was obtained. Prior Anticoagulants: The patient has                            taken no previous anticoagulant or antiplatelet                            agents. ASA Grade Assessment: II - A patient with                            mild systemic disease. After reviewing the risks                            and benefits, the patient was deemed in                            satisfactory condition to undergo the procedure.  After obtaining informed consent, the colonoscope                            was passed under direct vision. Throughout the                            procedure, the patient's blood pressure, pulse, and                            oxygen saturations were monitored continuously. The                            Colonoscope was introduced through the anus and                            advanced to the the cecum,  identified by                            appendiceal orifice and ileocecal valve. The                            ileocecal valve, appendiceal orifice, and rectum                            were photographed. The quality of the bowel                            preparation was excellent. The colonoscopy was                            performed without difficulty. The patient tolerated                            the procedure well. Scope In: 9:15:29 AM Scope Out: 9:28:57 AM Scope Withdrawal Time: 0 hours 9 minutes 43 seconds  Total Procedure Duration: 0 hours 13 minutes 28 seconds  Findings:                 The perianal and digital rectal examinations were                            normal.                           A 4 mm polyp was found in the ascending colon. The                            polyp was sessile. The polyp was removed with a                            cold biopsy forceps. Resection and retrieval were                            complete.  Internal hemorrhoids were found during                            retroflexion. The hemorrhoids were small and Grade                            I (internal hemorrhoids that do not prolapse).                           The exam was otherwise without abnormality on                            direct and retroflexion views. Complications:            No immediate complications. Estimated blood loss:                            None. Estimated Blood Loss:     Estimated blood loss: none. Impression:               - One 4 mm polyp in the ascending colon, removed                            with a cold biopsy forceps. Resected and retrieved.                           - Internal hemorrhoids.                           - The examination was otherwise normal on direct                            and retroflexion views. Recommendation:           - Patient has a contact number available for                            emergencies. The  signs and symptoms of potential                            delayed complications were discussed with the                            patient. Return to normal activities tomorrow.                            Written discharge instructions were provided to the                            patient.                           - Resume previous diet.                           - Continue present medications.                           -  Await pathology results.                           - No repeat colonoscopy due to age. Ladene Artist, MD 03/24/2017 9:36:21 AM This report has been signed electronically.

## 2017-03-24 NOTE — Progress Notes (Signed)
Pt. Arrived to recovery abdomen firm to touch,encouraged pt. To expel air,large amount of air expel during stay in recovery and abdomen changed to soft,upon sitting pt. Up he stated" it feels better but still hurts some" ambulated pt. To bathroom with spouse to allow to expel more air. Dr. Fuller Plan made aware. Pt. Expel more air and pt. Stated "I feel better I don't have much more to come out ,informed doctor of pt.  Statement order to d/c.

## 2017-03-24 NOTE — Progress Notes (Signed)
Called to room to assist during endoscopic procedure.  Patient ID and intended procedure confirmed with present staff. Received instructions for my participation in the procedure from the performing physician.  

## 2017-03-24 NOTE — Patient Instructions (Signed)
YOU HAD AN ENDOSCOPIC PROCEDURE TODAY AT THE [facility]:   Refer to the procedure report that was given to you for any specific questions about what was found during the examination.  If the procedure report does not answer your questions, please call your gastroenterologist to clarify.  If you requested that your care partner not be given the details of your procedure findings, then the procedure report has been included in a sealed envelope for you to review at your convenience later.  YOU SHOULD EXPECT: Some feelings of bloating in the abdomen. Passage of more gas than usual.  Walking can help get rid of the air that was put into your GI tract during the procedure and reduce the bloating. If you had a lower endoscopy (such as a colonoscopy or flexible sigmoidoscopy) you may notice spotting of blood in your stool or on the toilet paper. If you underwent a bowel prep for your procedure, you may not have a normal bowel movement for a few days.  Please Note:  You might notice some irritation and congestion in your nose or some drainage.  This is from the oxygen used during your procedure.  There is no need for concern and it should clear up in a day or so.  SYMPTOMS TO REPORT IMMEDIATELY:   Following lower endoscopy (colonoscopy or flexible sigmoidoscopy):  Excessive amounts of blood in the stool  Significant tenderness or worsening of abdominal pains  Swelling of the abdomen that is new, acute  Fever of 100F or higher    For urgent or emergent issues, a gastroenterologist can be reached at any hour by calling [phone].   DIET:  We do recommend a small meal at first, but then you may proceed to your regular diet.  Drink plenty of fluids but you should avoid alcoholic beverages for 24 hours.  ACTIVITY:  You should plan to take it easy for the rest of today and you should NOT DRIVE or use heavy machinery until tomorrow (because of the sedation medicines used during the test).     FOLLOW UP: Our staff will call the number listed on your records the next business day following your procedure to check on you and address any questions or concerns that you may have regarding the information given to you following your procedure. If we do not reach you, we will leave a message.  However, if you are feeling well and you are not experiencing any problems, there is no need to return our call.  We will assume that you have returned to your regular daily activities without incident.  If any biopsies were taken you will be contacted by phone or by letter within the next 1-3 weeks.  Please call us at [phone] if you have not heard about the biopsies in 3 weeks.    SIGNATURES/CONFIDENTIALITY: You and/or your care partner have signed paperwork which will be entered into your electronic medical record.  These signatures attest to the fact that that the information above on your After Visit Summary has been reviewed and is understood.  Full responsibility of the confidentiality of this discharge information lies with you and/or your care-partner.   Resume medications. Information given on polyps and hemorrhoids. 

## 2017-03-24 NOTE — Progress Notes (Signed)
Pt's states no medical or surgical changes since previsit or office visit. 

## 2017-03-25 ENCOUNTER — Telehealth: Payer: Self-pay | Admitting: *Deleted

## 2017-03-25 NOTE — Telephone Encounter (Signed)
  Follow up Call-  Call back number 03/24/2017  Post procedure Call Back phone  # [phone]  Permission to leave phone message Yes  Some recent data might be hidden     Patient questions:  Do you have a fever, pain , or abdominal swelling? No. Pain Score  0 *  Have you tolerated food without any problems? Yes.    Have you been able to return to your normal activities? Yes.    Do you have any questions about your discharge instructions: Diet   No. Medications  No. Follow up visit  No.  Do you have questions or concerns about your Care? No.  Actions: * If pain score is 4 or above: No action needed, pain <4.

## 2017-04-06 ENCOUNTER — Encounter: Payer: Self-pay | Admitting: Gastroenterology

## 2019-03-25 ENCOUNTER — Ambulatory Visit: Payer: Medicare Other | Attending: Internal Medicine

## 2019-03-25 DIAGNOSIS — Z23 Encounter for immunization: Secondary | ICD-10-CM

## 2019-03-25 NOTE — Progress Notes (Signed)
   Covid-19 Vaccination Clinic  Name:  Brian Castillo    MRN: [id number] DOB: 04-16-1940  03/25/2019  [name] was observed post Covid-19 immunization for 30 minutes based on pre-vaccination screening without incidence. He was provided with Vaccine Information Sheet and instruction to access the V-Safe system.   [name] was instructed to call 911 with any severe reactions post vaccine: [name] Difficulty breathing  . Swelling of your face and throat  . A fast heartbeat  . A bad rash all over your body  . Dizziness and weakness    Immunizations Administered    Name Date Dose VIS Date Route   Pfizer COVID-19 Vaccine 03/25/2019  1:18 PM 0.3 mL 02/24/2019 Intramuscular   Manufacturer: [employer]   Lot: [id number]   [place]: [id number]

## 2019-04-15 ENCOUNTER — Ambulatory Visit: Payer: Medicare Other | Attending: Internal Medicine

## 2019-04-15 DIAGNOSIS — Z23 Encounter for immunization: Secondary | ICD-10-CM | POA: Insufficient documentation

## 2019-04-15 NOTE — Progress Notes (Signed)
   Covid-19 Vaccination Clinic  Name:  IHAB WOLTZ    MRN: [id number] DOB: 1941/01/25  04/15/2019  Mr. Tschirhart was observed post Covid-19 immunization for 15 minutes without incidence. He was provided with Vaccine Information Sheet and instruction to access the V-Safe system.   Mr. Bushy was instructed to call 911 with any severe reactions post vaccine: [name] Difficulty breathing  . Swelling of your face and throat  . A fast heartbeat  . A bad rash all over your body  . Dizziness and weakness    Immunizations Administered    Name Date Dose VIS Date Route   Pfizer COVID-19 Vaccine 04/15/2019 10:52 AM 0.3 mL 02/24/2019 Intramuscular   Manufacturer: [place]   Lot: [id number]   [place]: [id number]

## 2019-12-12 ENCOUNTER — Other Ambulatory Visit (HOSPITAL_COMMUNITY): Payer: Self-pay | Admitting: Nurse Practitioner

## 2019-12-12 ENCOUNTER — Encounter: Payer: Self-pay | Admitting: Nurse Practitioner

## 2019-12-12 DIAGNOSIS — U071 COVID-19: Secondary | ICD-10-CM

## 2019-12-12 NOTE — Progress Notes (Signed)
I connected by phone with Sharen Hint on 12/12/2019 at 8:49 PM to discuss the potential use of an new treatment for mild to moderate COVID-19 viral infection in non-hospitalized patients.  This patient is a 79 y.o. male that meets the FDA criteria for Emergency Use Authorization of casirivimab\imdevimab.  Has a (+) direct SARS-CoV-2 viral test result  Has mild or moderate COVID-19   Is ? 81 years of age and weighs ? 40 kg  Is NOT hospitalized due to COVID-19  Is NOT requiring oxygen therapy or requiring an increase in baseline oxygen flow rate due to COVID-19  Is within 10 days of symptom onset  Has at least one of the high risk factor(s) for progression to severe COVID-19 and/or hospitalization as defined in EUA.  Specific high risk criteria : Older age (>/= 81 yo) and Cardiovascular disease or hypertension   Onset 9/25.    I have spoken and communicated the following to the patient or parent/caregiver:  1. FDA has authorized the emergency use of casirivimab\imdevimab for the treatment of mild to moderate COVID-19 in adults and pediatric patients with positive results of direct SARS-CoV-2 viral testing who are 58 years of age and older weighing at least 40 kg, and who are at high risk for progressing to severe COVID-19 and/or hospitalization.  2. The significant known and potential risks and benefits of casirivimab\imdevimab, and the extent to which such potential risks and benefits are unknown.  3. Information on available alternative treatments and the risks and benefits of those alternatives, including clinical trials.  4. Patients treated with casirivimab\imdevimab should continue to self-isolate and use infection control measures (e.g., wear mask, isolate, social distance, avoid sharing personal items, clean and disinfect "high touch" surfaces, and frequent handwashing) according to CDC guidelines.   5. The patient or parent/caregiver has the option to accept or refuse  casirivimab\imdevimab .  After reviewing this information with the patient, the patient has agreed to receive one of the available covid 19 monoclonal antibodies and will be provided an appropriate fact sheet prior to infusion.Beckey Rutter, [place], AGNP-C [phone] ([place])

## 2019-12-13 ENCOUNTER — Ambulatory Visit (HOSPITAL_COMMUNITY)
Admission: RE | Admit: 2019-12-13 | Discharge: 2019-12-13 | Disposition: A | Discharge status: Home / Self Care | Payer: Medicare Other | Source: Ambulatory Visit | Attending: Pulmonary Disease | Admitting: Pulmonary Disease

## 2019-12-13 ENCOUNTER — Encounter (HOSPITAL_COMMUNITY): Payer: Self-pay

## 2019-12-13 DIAGNOSIS — U071 COVID-19: Secondary | ICD-10-CM | POA: Diagnosis present

## 2019-12-13 DIAGNOSIS — Z23 Encounter for immunization: Secondary | ICD-10-CM | POA: Diagnosis not present

## 2019-12-13 MED ORDER — SODIUM CHLORIDE 0.9 % IV SOLN: INTRAVENOUS | Status: DC | PRN

## 2019-12-13 MED ORDER — DIPHENHYDRAMINE HCL 50 MG/ML IJ SOLN: 50.0000 mg | Freq: Once | INTRAMUSCULAR | Status: DC | PRN

## 2019-12-13 MED ORDER — SODIUM CHLORIDE 0.9 % IV SOLN
1200.0000 mg | Freq: Once | INTRAVENOUS | Status: AC
  Administered 2019-12-13: 1200 mg via INTRAVENOUS

## 2019-12-13 MED ORDER — ALBUTEROL SULFATE HFA 108 (90 BASE) MCG/ACT IN AERS: 2.0000 | INHALATION_SPRAY | Freq: Once | RESPIRATORY_TRACT | Status: DC | PRN

## 2019-12-13 MED ORDER — EPINEPHRINE 0.3 MG/0.3ML IJ SOAJ: 0.3000 mg | Freq: Once | INTRAMUSCULAR | Status: DC | PRN

## 2019-12-13 MED ORDER — FAMOTIDINE IN NACL 20-0.9 MG/50ML-% IV SOLN: 20.0000 mg | Freq: Once | INTRAVENOUS | Status: DC | PRN

## 2019-12-13 MED ORDER — METHYLPREDNISOLONE SODIUM SUCC 125 MG IJ SOLR: 125.0000 mg | Freq: Once | INTRAMUSCULAR | Status: DC | PRN

## 2019-12-13 NOTE — Progress Notes (Addendum)
Patient ID: Brian Castillo, male   DOB: 07-Jun-1940, 79 y.o.   MRN: [id number]   Diagnosis: [id number]  Physician: MD Joya Gaskins Procedure: Covid Infusion Clinic Med: casirivimab\imdevimab infusion - Provided patient with casirivimab\imdevimab fact sheet for patients, parents and caregivers prior to infusion.  Complications: No immediate complications noted.  Discharge: Discharged home   Lynne Logan 12/13/2019

## 2019-12-13 NOTE — Discharge Instructions (Signed)

## 2020-01-30 ENCOUNTER — Ambulatory Visit
Admission: RE | Admit: 2020-01-30 | Discharge: 2020-01-30 | Disposition: A | Discharge status: Home / Self Care | Payer: Medicare Other | Source: Ambulatory Visit | Attending: Internal Medicine | Admitting: Internal Medicine

## 2020-01-30 ENCOUNTER — Other Ambulatory Visit: Payer: Self-pay | Admitting: Internal Medicine

## 2020-01-30 DIAGNOSIS — R590 Localized enlarged lymph nodes: Secondary | ICD-10-CM

## 2020-01-30 MED ORDER — IOPAMIDOL (ISOVUE-300) INJECTION 61%
75.0000 mL | Freq: Once | INTRAVENOUS | Status: AC | PRN
  Administered 2020-01-30: 75 mL via INTRAVENOUS

## 2020-02-01 ENCOUNTER — Other Ambulatory Visit (HOSPITAL_COMMUNITY): Payer: Self-pay | Admitting: Internal Medicine

## 2020-02-01 ENCOUNTER — Encounter (HOSPITAL_COMMUNITY): Payer: Self-pay | Admitting: Radiology

## 2020-02-01 DIAGNOSIS — C8591 Non-Hodgkin lymphoma, unspecified, lymph nodes of head, face, and neck: Secondary | ICD-10-CM

## 2020-02-01 NOTE — Progress Notes (Signed)
Loic J. Conrad Zajkowski" Male, 79 y.o., 24-May-1940 MRN:  [id number] Phone:  [phone] (H) PCP:  Shon Baton, MD Coverage:  [facility] ([facility] 2) 02/06/2020 at 1:00 PM  Message Received: 2 days ago Donn Pierini D     Previous Messages   ----- Message -----  From: Markus Daft, MD  Sent: 01/30/2020  1:20 PM EST  To: Lenore Cordia   Dr. Virgina Jock will be ordering an US guided neck lymph node biopsy. I have reviewed the case and it is approved. Please schedule ASAP.   Thanks,  Markus Daft

## 2020-02-05 ENCOUNTER — Other Ambulatory Visit: Payer: Self-pay | Admitting: Radiology

## 2020-02-06 ENCOUNTER — Other Ambulatory Visit: Payer: Self-pay

## 2020-02-06 ENCOUNTER — Ambulatory Visit (HOSPITAL_COMMUNITY)
Admission: RE | Admit: 2020-02-06 | Discharge: 2020-02-06 | Disposition: A | Discharge status: Home / Self Care | Payer: Medicare Other | Source: Ambulatory Visit | Attending: Internal Medicine | Admitting: Internal Medicine

## 2020-02-06 DIAGNOSIS — Z7982 Long term (current) use of aspirin: Secondary | ICD-10-CM | POA: Insufficient documentation

## 2020-02-06 DIAGNOSIS — C8591 Non-Hodgkin lymphoma, unspecified, lymph nodes of head, face, and neck: Secondary | ICD-10-CM

## 2020-02-06 DIAGNOSIS — Z79899 Other long term (current) drug therapy: Secondary | ICD-10-CM | POA: Insufficient documentation

## 2020-02-06 DIAGNOSIS — E785 Hyperlipidemia, unspecified: Secondary | ICD-10-CM | POA: Diagnosis not present

## 2020-02-06 DIAGNOSIS — C8511 Unspecified B-cell lymphoma, lymph nodes of head, face, and neck: Secondary | ICD-10-CM | POA: Insufficient documentation

## 2020-02-06 DIAGNOSIS — Z87891 Personal history of nicotine dependence: Secondary | ICD-10-CM | POA: Insufficient documentation

## 2020-02-06 DIAGNOSIS — Z888 Allergy status to other drugs, medicaments and biological substances status: Secondary | ICD-10-CM | POA: Insufficient documentation

## 2020-02-06 DIAGNOSIS — R590 Localized enlarged lymph nodes: Secondary | ICD-10-CM | POA: Diagnosis present

## 2020-02-06 DIAGNOSIS — I10 Essential (primary) hypertension: Secondary | ICD-10-CM | POA: Insufficient documentation

## 2020-02-06 LAB — CBC
HCT: 42.1 % (ref 39.0–52.0)
Hemoglobin: 14 g/dL (ref 13.0–17.0)
MCH: 32.4 pg (ref 26.0–34.0)
MCHC: 33.3 g/dL (ref 30.0–36.0)
MCV: 97.5 fL (ref 80.0–100.0)
Platelets: 223 10*3/uL (ref 150–400)
RBC: 4.32 MIL/uL (ref 4.22–5.81)
RDW: 12.5 % (ref 11.5–15.5)
WBC: 7.8 10*3/uL (ref 4.0–10.5)
nRBC: 0 % (ref 0.0–0.2)

## 2020-02-06 LAB — PROTIME-INR
INR: 1 (ref 0.8–1.2)
Prothrombin Time: 12.6 seconds (ref 11.4–15.2)

## 2020-02-06 MED ORDER — MIDAZOLAM HCL 2 MG/2ML IJ SOLN
INTRAMUSCULAR | Status: AC | PRN
  Administered 2020-02-06 (×2): 0.5 mg via INTRAVENOUS

## 2020-02-06 MED ORDER — MIDAZOLAM HCL 2 MG/2ML IJ SOLN
INTRAMUSCULAR | Status: AC
  Filled 2020-02-06: qty 2

## 2020-02-06 MED ORDER — FENTANYL CITRATE (PF) 100 MCG/2ML IJ SOLN
INTRAMUSCULAR | Status: AC | PRN
  Administered 2020-02-06: 25 ug via INTRAVENOUS

## 2020-02-06 MED ORDER — FENTANYL CITRATE (PF) 100 MCG/2ML IJ SOLN
INTRAMUSCULAR | Status: DC
Start: 2020-02-06 — End: 2020-02-07
  Filled 2020-02-06: qty 2

## 2020-02-06 MED ORDER — LIDOCAINE HCL (PF) 1 % IJ SOLN
INTRAMUSCULAR | Status: AC
  Filled 2020-02-06: qty 5

## 2020-02-06 MED ORDER — SODIUM CHLORIDE 0.9 % IV SOLN: INTRAVENOUS | Status: DC

## 2020-02-06 NOTE — H&P (Signed)
Chief Complaint: Submandibular lymphadenopathy  Referring Physician(s): Russo,John  Supervising Physician: Markus Daft  Patient Status: [facility] - Out-pt  History of Present Illness: Brian Castillo is a 79 y.o. male 79 y.o. male outpatient. History of HTN, HLD.  Found to have submandibular adenopathy while at the dentists office. Team is requesting a submandibular biopsy for further evaluation.  Patient denies any issues with airway, shob.or pain. Return precautions and treatment recommendations and follow-up discussed with the patient who is agreeable with the plan.   Past Medical History:  Diagnosis Date  . Allergy    mild  . Arthritis   . Cataract    forming  . Colon polyps   . GERD (gastroesophageal reflux disease)    15 -20 years ago -none currently  . Glaucoma   . Hyperlipidemia   . Hypertension   . 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 Seizures ([place])    last seizure was in 1988    Past Surgical History:  Procedure Laterality Date  . CARPOMETACARPEL SUSPENSION PLASTY Right 08/24/2014   Procedure: RIGHT CMC ARTHOPLASTY WITH DOUBLE TENDON TRANSFER AND REPAIR  RECONSTRUCTION ;  Surgeon: Roseanne Kaufman, MD;  Location: [facility];  Service: Orthopedics;  Laterality: Right;  . CHOLECYSTECTOMY    . COLONOSCOPY    . KNEE ARTHROSCOPY  2003   bilateral  . POLYPECTOMY      Allergies: Lisinopril  Medications: Prior to Admission medications   Medication Sig Start Date End Date Taking? Authorizing Provider  amLODipine (NORVASC) 5 MG tablet Take 5 mg by mouth Daily.  11/28/11  Yes [provider]  aspirin 81 MG tablet Take 81 mg by mouth daily.   Yes [provider]  Calcium Carb-Cholecalciferol (CALCIUM 600 + D PO) Take 1 tablet by mouth daily.   Yes [provider]  cholecalciferol (VITAMIN D) 1000 units tablet Take 1,000 Units by mouth daily.   Yes [provider]  Latanoprostene Bunod (VYZULTA) 0.024 %  SOLN Place 1 drop into both eyes at bedtime.   Yes [provider]  metoprolol succinate (TOPROL-XL) 25 MG 24 hr tablet Take 25 mg by mouth Daily.  11/28/11  Yes [provider]  phenytoin (DILANTIN) 100 MG ER capsule Take 200 mg by mouth 2 (two) times daily.   Yes [provider]  pravastatin (PRAVACHOL) 80 MG tablet Take 80 mg by mouth Daily.  11/28/11  Yes [provider]  timolol (BETIMOL) 0.5 % ophthalmic solution Place 1 drop into both eyes 2 (two) times daily.   Yes [provider]     Family History  Problem Relation Age of Onset  . Colon cancer Neg Hx   . Colon polyps Neg Hx     Social History   Socioeconomic History  . Marital status: Married    Spouse name: Not on file  . Number of children: Not on file  . Years of education: Not on file  . Highest education level: Not on file  Occupational History  . Not on file  Tobacco Use  . Smoking status: Former Research scientist (life sciences)  . Smokeless tobacco: Never Used  . Tobacco comment: quit 1984  Substance and Sexual Activity  . Alcohol use: Yes    Alcohol/week: 14.0 standard drinks    Types: 14 Glasses of wine per week  . Drug use: No  . Sexual activity: Not on file  Other Topics Concern  . Not on file  Social History Narrative  .  Not on file   Social Determinants of Health   Financial Resource Strain:   . Difficulty of Paying Living Expenses: Not on file  Food Insecurity:   . Worried About Charity fundraiser in the Last Year: Not on file  . Ran Out of Food in the Last Year: Not on file  Transportation Needs:   . Lack of Transportation (Medical): Not on file  . Lack of Transportation (Non-Medical): Not on file  Physical Activity:   . Days of Exercise per Week: Not on file  . Minutes of Exercise per Session: Not on file  Stress:   . Feeling of Stress : Not on file  Social Connections:   . Frequency of Communication with Friends and Family: Not on file  . Frequency of Social  Gatherings with Friends and Family: Not on file  . Attends Religious Services: Not on file  . Active Member of Clubs or Organizations: Not on file  . Attends Archivist Meetings: Not on file  . Marital Status: Not on file     Review of Systems: A 12 point ROS discussed and pertinent positives are indicated in the HPI above.  All other systems are negative.  Review of Systems  Constitutional: Negative for fever.  HENT: Negative for congestion.   Respiratory: Negative for cough and shortness of breath.   Cardiovascular: Negative for chest pain.  Gastrointestinal: Negative for abdominal pain.  Neurological: Negative for headaches.  Psychiatric/Behavioral: Negative for behavioral problems and confusion.    Vital Signs: BP (!) 157/56   Pulse (!) 54   Temp 97.9 F (36.6 C) (Oral)   Ht 5\' 8"  (1.727 m)   Wt 172 lb (78 kg)   SpO2 100%   BMI 26.15 kg/m   Physical Exam Vitals and nursing note reviewed.  Constitutional:      Appearance: He is well-developed.  HENT:     Head: Normocephalic.  Neck:     Comments: Right sided swelling. Cardiovascular:     Rate and Rhythm: Normal rate and regular rhythm.     Heart sounds: Normal heart sounds.  Pulmonary:     Effort: Pulmonary effort is normal.     Breath sounds: Normal breath sounds.  Musculoskeletal:        General: Normal range of motion.     Cervical back: Normal range of motion.  Skin:    General: Skin is dry.  Neurological:     Mental Status: He is alert and oriented to person, place, and time.     Imaging: CT SOFT TISSUE NECK W CONTRAST  Result Date: 01/30/2020 CLINICAL DATA:  Submandibular adenopathy noted at dentist appointment last week EXAM: CT NECK WITH CONTRAST TECHNIQUE: Multidetector CT imaging of the neck was performed using the standard protocol following the bolus administration of intravenous contrast. Creatinine was obtained on site at [name] at [address] Results:  Creatinine 0.9 mg/dL. CONTRAST:  96mL ISOVUE-300 IOPAMIDOL (ISOVUE-300) INJECTION 61% COMPARISON:  None. FINDINGS: Pharynx and larynx: No evidence of mass or swelling. Palatine tonsilliths. No thickening of Waldeyer's ring Salivary glands: No inflammation, mass, or stone. Thyroid: Subcentimeter right thyroid nodule. No followup recommended (ref: J Am Coll Radiol. 2015 Feb;12(2): 143-50). Lymph nodes: Homogeneously rounded and enlarged lymph nodes in the bilateral neck - right submandibular, bilateral jugular, and bilateral posterior triangle. The largest node is in the region of palpable marker in the right submandibular space, measuring 2.6 cm long axis. Vascular: Moderate atheromatous changes in the  neck. Limited intracranial: Avidly enhancing mass which appears dural based in the low right posterior fossa, close but not clearly a originating from the right vertebral artery or PICA, 9 mm in diameter. Visualized orbits: Negative Mastoids and visualized paranasal sinuses: Clear Skeleton: Ordinary degenerative changes. No acute or aggressive finding Upper chest: Negative Other: These results will be called to the ordering clinician or representative by the Radiologist Assistant, and communication documented in the PACS or [employer]. IMPRESSION: 1. Numerous enlarged lymph nodes, especially in the right submandibular space - which is the palpable abnormality. Lymphoma/lymphoproliferative process is favored in the absence of known granulomatous disease. Consider biopsy. 2. 9 mm mass in the low posterior fossa. 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 Especially given location, recommend imaging follow-up. Electronically Signed   By: Monte Fantasia M.D.   On: 01/30/2020 11:49    Labs:  CBC: No results for input(s): WBC, HGB, HCT, PLT in the last 8760 hours.  COAGS: No results for input(s): INR, APTT in the last  8760 hours.  BMP: No results for input(s): NA, K, CL, CO2, GLUCOSE, BUN, CALCIUM, CREATININE, GFRNONAA, GFRAA in the last 8760 hours.  Invalid input(s): CMP   Assessment and Plan:  79 y.o. male outpatient. History of HTN, HLD.  Found to have submandibular adenopathy while at the dentists office. Team is requesting a submandibular biopsy for further evaluation. No recent labs. All medications are within acceptable parameters. NKDA. Patient npo since.   Risks and benefits of submandibular was discussed with the patient and/or patient's family including, but not limited to bleeding, infection, damage to adjacent structures or low yield requiring additional tests.  All of the questions were answered and there is agreement to proceed.  Consent signed and in chart. Thank you for this interesting consult.  I greatly enjoyed meeting Brian Castillo and look forward to participating in their care.  A copy of this report was sent to the requesting provider on this date.  Electronically Signed: Jacqualine Mau, NP 02/06/2020, 11:26 AM   I spent a total of  30 Minutes   in face to face in clinical consultation, greater than 50% of which was counseling/coordinating care for submandibular biopsy

## 2020-02-06 NOTE — Procedures (Signed)
Interventional Radiology Procedure:   Indications:  Cervical lymphadenopathy  Procedure: US guided biopsy of right submandibular lymph node  Findings: Enlarged right submandibular node, 6 cores obtained.  Complications: None     EBL: less than 10 ml  Plan: Discharge to home    [place]. Anselm Pancoast, MD  Pager: [phone]

## 2020-02-06 NOTE — Discharge Instructions (Signed)

## 2020-02-09 LAB — SURGICAL PATHOLOGY

## 2020-02-12 ENCOUNTER — Inpatient Hospital Stay: Payer: Medicare Other

## 2020-02-12 ENCOUNTER — Inpatient Hospital Stay: Payer: Medicare Other | Attending: Hematology and Oncology | Admitting: Hematology and Oncology

## 2020-02-12 ENCOUNTER — Telehealth: Payer: Self-pay | Admitting: Hematology and Oncology

## 2020-02-12 ENCOUNTER — Encounter: Payer: Self-pay | Admitting: Hematology and Oncology

## 2020-02-12 ENCOUNTER — Other Ambulatory Visit: Payer: Self-pay

## 2020-02-12 VITALS — BP 137/66 | HR 53 | Temp 97.5°F | Resp 17 | Ht 68.0 in | Wt 174.9 lb

## 2020-02-12 DIAGNOSIS — C8511 Unspecified B-cell lymphoma, lymph nodes of head, face, and neck: Secondary | ICD-10-CM | POA: Insufficient documentation

## 2020-02-12 DIAGNOSIS — Z7189 Other specified counseling: Secondary | ICD-10-CM

## 2020-02-12 DIAGNOSIS — M1990 Unspecified osteoarthritis, unspecified site: Secondary | ICD-10-CM | POA: Diagnosis not present

## 2020-02-12 DIAGNOSIS — E041 Nontoxic single thyroid nodule: Secondary | ICD-10-CM | POA: Diagnosis not present

## 2020-02-12 DIAGNOSIS — Z79899 Other long term (current) drug therapy: Secondary | ICD-10-CM | POA: Insufficient documentation

## 2020-02-12 DIAGNOSIS — Z86010 Personal history of colonic polyps: Secondary | ICD-10-CM | POA: Diagnosis not present

## 2020-02-12 DIAGNOSIS — Z7982 Long term (current) use of aspirin: Secondary | ICD-10-CM | POA: Diagnosis not present

## 2020-02-12 DIAGNOSIS — K219 Gastro-esophageal reflux disease without esophagitis: Secondary | ICD-10-CM | POA: Diagnosis not present

## 2020-02-12 DIAGNOSIS — C8251 Diffuse follicle center lymphoma, lymph nodes of head, face, and neck: Secondary | ICD-10-CM | POA: Diagnosis not present

## 2020-02-12 DIAGNOSIS — Z87891 Personal history of nicotine dependence: Secondary | ICD-10-CM | POA: Insufficient documentation

## 2020-02-12 DIAGNOSIS — E785 Hyperlipidemia, unspecified: Secondary | ICD-10-CM | POA: Insufficient documentation

## 2020-02-12 DIAGNOSIS — R634 Abnormal weight loss: Secondary | ICD-10-CM | POA: Diagnosis not present

## 2020-02-12 DIAGNOSIS — I10 Essential (primary) hypertension: Secondary | ICD-10-CM | POA: Diagnosis not present

## 2020-02-12 LAB — CBC WITH DIFFERENTIAL (CANCER CENTER ONLY)
Abs Immature Granulocytes: 0.02 10*3/uL (ref 0.00–0.07)
Basophils Absolute: 0 10*3/uL (ref 0.0–0.1)
Basophils Relative: 1 %
Eosinophils Absolute: 0.3 10*3/uL (ref 0.0–0.5)
Eosinophils Relative: 5 %
HCT: 38.3 % — ABNORMAL LOW (ref 39.0–52.0)
Hemoglobin: 13.1 g/dL (ref 13.0–17.0)
Immature Granulocytes: 0 %
Lymphocytes Relative: 21 %
Lymphs Abs: 1.3 10*3/uL (ref 0.7–4.0)
MCH: 31.9 pg (ref 26.0–34.0)
MCHC: 34.2 g/dL (ref 30.0–36.0)
MCV: 93.2 fL (ref 80.0–100.0)
Monocytes Absolute: 0.3 10*3/uL (ref 0.1–1.0)
Monocytes Relative: 5 %
Neutro Abs: 4.2 10*3/uL (ref 1.7–7.7)
Neutrophils Relative %: 68 %
Platelet Count: 164 10*3/uL (ref 150–400)
RBC: 4.11 MIL/uL — ABNORMAL LOW (ref 4.22–5.81)
RDW: 12.7 % (ref 11.5–15.5)
WBC Count: 6.3 10*3/uL (ref 4.0–10.5)
nRBC: 0 % (ref 0.0–0.2)

## 2020-02-12 LAB — CMP (CANCER CENTER ONLY)
ALT: 17 U/L (ref 0–44)
AST: 18 U/L (ref 15–41)
Albumin: 3.9 g/dL (ref 3.5–5.0)
Alkaline Phosphatase: 114 U/L (ref 38–126)
Anion gap: 10 (ref 5–15)
BUN: 21 mg/dL (ref 8–23)
CO2: 25 mmol/L (ref 22–32)
Calcium: 9.1 mg/dL (ref 8.9–10.3)
Chloride: 105 mmol/L (ref 98–111)
Creatinine: 0.84 mg/dL (ref 0.61–1.24)
GFR, Estimated: >60 mL/min (ref >60)
Glucose, Bld: 107 mg/dL — ABNORMAL HIGH (ref 70–99)
Potassium: 4.2 mmol/L (ref 3.5–5.1)
Sodium: 140 mmol/L (ref 135–145)
Total Bilirubin: 0.3 mg/dL (ref 0.3–1.2)
Total Protein: 7.1 g/dL (ref 6.5–8.1)

## 2020-02-12 LAB — URIC ACID: Uric Acid, Serum: 6.4 mg/dL (ref 3.7–8.6)

## 2020-02-12 LAB — HEPATITIS B CORE ANTIBODY, TOTAL: Hep B Core Total Ab: NONREACTIVE

## 2020-02-12 LAB — LACTATE DEHYDROGENASE: LDH: 210 U/L — ABNORMAL HIGH (ref 98–192)

## 2020-02-12 LAB — HEPATITIS C ANTIBODY: HCV Ab: NONREACTIVE

## 2020-02-12 LAB — HEPATITIS B SURFACE ANTIGEN: Hepatitis B Surface Ag: NONREACTIVE

## 2020-02-12 LAB — HEPATITIS B SURFACE ANTIBODY,QUALITATIVE: Hep B S Ab: REACTIVE — AB

## 2020-02-12 NOTE — Progress Notes (Signed)
[place] [phone]   Fax:([phone]) [place] NOTE  Patient Care Team: Shon Baton, MD as PCP - General (Internal Medicine)  Hematological/Oncological History # Non Hodgkin B Cell Lymphoma, Favor Follicular lymphoma 1) 86/76/7209: CT neck shows numerous enlarged lymph nodes, especially in the right submandibular space - which is the palpable abnormality. 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 Lorenso Courier   CHIEF COMPLAINTS/PURPOSE OF CONSULTATION:  "Non-Hodgkin Lymphoma "  HISTORY OF PRESENTING ILLNESS:  Brian Castillo 79 y.o. 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  On review of the previous records Brian Castillo developed an enlarged lymph node in his neck which failed to respond to antibiotic therapy as prescribed by his primary care provider.  After third resolve on 01/30/2020 the patient underwent a CT scan of the neck which showed numerous enlarged lymph nodes especially the right submandibular space.  On 02/06/2020 the patient underwent ultrasound guided core biopsy of the cervical lymph node which revealed non-Hodgkin B-cell lymphoma, most consistent with follicular lymphoma.  Due to concern for this finding the patient was referred to hematology for further evaluation and management.  On exam today Brian Castillo reports that the lymph node in his neck was first discovered approximately 2.5 weeks ago while at the dentist.  He reports that the dentist felt noted and told him to report to his PCP.  His PCP then prescribed him amoxicillin and when it failed to resolve scheduled a CT scan details of which are listed above.  He reports that he did not notice a lymph node and so was brought to his attention by dentistry.  He notes that he has been  losing weight unintentionally, approximately 6 pounds since September and October.  He notes that he was doing a lot of moving and packing in order to move into an assisted living facility called [facility].  On further discussion he notes that he has a history of seizures but last had a seizure in 1988.  He is currently well controlled Dilantin.  He also had surgery bilaterally on his thumbs and has gallbladder removed in 1970.  His family history is relatively unremarkable as he is unaware of his father side and his mother only had heart disease.  He notes that his half-sister is obese and that he has to nonbiological children.  He was a previous smoker and smoked in the 1950s until approximately 1984.  He currently drinks about 2 glasses of wine per day.  When asked about other symptoms he notes that he is otherwise been well with good levels of energy and denies any fevers, chills, sweats, nausea, vomiting or diarrhea.  A full 10 point ROS is listed below.  MEDICAL HISTORY:  Past Medical History:  Diagnosis Date  . Allergy    mild  . Arthritis   . Cataract    forming  . Colon polyps   . GERD (gastroesophageal reflux disease)    15 -20 years ago -none currently  . Glaucoma   . Hyperlipidemia   . Hypertension   . 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 Seizures ([place])    last seizure was in 1988    SURGICAL HISTORY: Past Surgical History:  Procedure Laterality Date  . CARPOMETACARPEL SUSPENSION PLASTY Right 08/24/2014   Procedure:  RIGHT CMC ARTHOPLASTY WITH DOUBLE TENDON TRANSFER AND REPAIR  RECONSTRUCTION ;  Surgeon: Roseanne Kaufman, MD;  Location: [facility];  Service: Orthopedics;  Laterality: Right;  . CHOLECYSTECTOMY    . COLONOSCOPY    . KNEE ARTHROSCOPY  2003   bilateral  . POLYPECTOMY      SOCIAL HISTORY: Social History   Socioeconomic History  . Marital status: Married    Spouse name: Not on file  . Number of children: Not on file  . Years of  education: Not on file  . Highest education level: Not on file  Occupational History  . Not on file  Tobacco Use  . Smoking status: Former Research scientist (life sciences)  . Smokeless tobacco: Never Used  . Tobacco comment: quit 1984  Substance and Sexual Activity  . Alcohol use: Yes    Alcohol/week: 14.0 standard drinks    Types: 14 Glasses of wine per week  . Drug use: No  . Sexual activity: Not on file  Other Topics Concern  . Not on file  Social History Narrative  . Not on file   Social Determinants of Health   Financial Resource Strain:   . Difficulty of Paying Living Expenses: Not on file  Food Insecurity:   . Worried About Charity fundraiser in the Last Year: Not on file  . Ran Out of Food in the Last Year: Not on file  Transportation Needs:   . Lack of Transportation (Medical): Not on file  . Lack of Transportation (Non-Medical): Not on file  Physical Activity:   . Days of Exercise per Week: Not on file  . Minutes of Exercise per Session: Not on file  Stress:   . Feeling of Stress : Not on file  Social Connections:   . Frequency of Communication with Friends and Family: Not on file  . Frequency of Social Gatherings with Friends and Family: Not on file  . Attends Religious Services: Not on file  . Active Member of Clubs or Organizations: Not on file  . Attends Archivist Meetings: Not on file  . Marital Status: Not on file  Intimate Partner Violence:   . Fear of Current or Ex-Partner: Not on file  . Emotionally Abused: Not on file  . Physically Abused: Not on file  . Sexually Abused: Not on file    FAMILY HISTORY: Family History  Problem Relation Age of Onset  . Colon cancer Neg Hx   . Colon polyps Neg Hx     ALLERGIES:  is allergic to lisinopril.  MEDICATIONS:  Current Outpatient Medications  Medication Sig Dispense Refill  . amLODipine (NORVASC) 5 MG tablet Take 5 mg by mouth Daily.     [name] aspirin 81 MG tablet Take 81 mg by mouth daily.    . Calcium  Carb-Cholecalciferol (CALCIUM 600 + D PO) Take 1 tablet by mouth daily.    . cholecalciferol (VITAMIN D) 1000 units tablet Take 1,000 Units by mouth daily.    . Latanoprostene Bunod (VYZULTA) 0.024 % SOLN Place 1 drop into both eyes at bedtime.    . metoprolol succinate (TOPROL-XL) 25 MG 24 hr tablet Take 25 mg by mouth Daily.     . phenytoin (DILANTIN) 100 MG ER capsule Take 200 mg by mouth 2 (two) times daily.    . pravastatin (PRAVACHOL) 80 MG tablet Take 80 mg by mouth Daily.     . timolol (BETIMOL) 0.5 % ophthalmic solution Place 1 drop into both eyes 2 (two)  times daily.     No current facility-administered medications for this visit.    REVIEW OF SYSTEMS:   Constitutional: ( - ) fevers, ( - )  chills , ( - ) night sweats Eyes: ( - ) blurriness of vision, ( - ) double vision, ( - ) watery eyes Ears, nose, mouth, throat, and face: ( - ) mucositis, ( - ) sore throat Respiratory: ( - ) cough, ( - ) dyspnea, ( - ) wheezes Cardiovascular: ( - ) palpitation, ( - ) chest discomfort, ( - ) lower extremity swelling Gastrointestinal:  ( - ) nausea, ( - ) heartburn, ( - ) change in bowel habits Skin: ( - ) abnormal skin rashes Lymphatics: ( - ) new lymphadenopathy, ( - ) easy bruising Neurological: ( - ) numbness, ( - ) tingling, ( - ) new weaknesses Behavioral/Psych: ( - ) mood change, ( - ) new changes  All other systems were reviewed with the patient and are negative.  PHYSICAL EXAMINATION: ECOG PERFORMANCE STATUS: 0 - Asymptomatic  Vitals:   02/12/20 1259  BP: 137/66  Pulse: (!) 53  Resp: 17  Temp: (!) 97.5 F (36.4 C)  SpO2: 100%   Filed Weights   02/12/20 1259  Weight: 174 lb 14.4 oz (79.3 kg)    GENERAL: well appearing elderly Caucasian male in NAD  SKIN: skin color, texture, turgor are normal, no rashes or significant lesions EYES: conjunctiva are pink and non-injected, sclera clear NECK: supple, non-tender LYMPH:  Single large right sided cervical lymph node.  Otherwise no palpable lymphadenopathy in the cervical, axillary or supraclavicular lymph nodes.  LUNGS: clear to auscultation and percussion with normal breathing effort HEART: regular rate & rhythm and no murmurs and no lower extremity edema ABDOMEN: soft, non-tender, non-distended, normal bowel sounds. No HSM.  Musculoskeletal: no cyanosis of digits and no clubbing  PSYCH: alert & oriented x 3, fluent speech NEURO: no focal motor/sensory deficits  LABORATORY DATA:  I have reviewed the data as listed CBC Latest Ref Rng & Units 02/12/2020 02/06/2020 08/24/2014  WBC 4.0 - 10.5 K/uL 6.3 7.8 -  Hemoglobin 13.0 - 17.0 g/dL 13.1 14.0 14.1  Hematocrit 39 - 52 % 38.3(L) 42.1 -  Platelets 150 - 400 K/uL 164 223 -    CMP Latest Ref Rng & Units 02/12/2020 08/22/2014  Glucose 70 - 99 mg/dL 107(H) 93  BUN 8 - 23 mg/dL 21 17  Creatinine 0.61 - 1.24 mg/dL 0.84 0.83  Sodium 135 - 145 mmol/L 140 137  Potassium 3.5 - 5.1 mmol/L 4.2 4.5  Chloride 98 - 111 mmol/L 105 105  CO2 22 - 32 mmol/L 25 28  Calcium 8.9 - 10.3 mg/dL 9.1 8.8(L)  Total Protein 6.5 - 8.1 g/dL 7.1 -  Total Bilirubin 0.3 - 1.2 mg/dL 0.3 -  Alkaline Phos 38 - 126 U/L 114 -  AST 15 - 41 U/L 18 -  ALT 0 - 44 U/L 17 -     PATHOLOGY: SURGICAL PATHOLOGY  CASE: [id number]  PATIENT: Brian Castillo  Surgical Pathology Report   Clinical History: enlarged cervical lymph nodes, cervical  lymphadenopathy, concern for lymphoproliferative process (cm)   FINAL MICROSCOPIC DIAGNOSIS:   A. LYMPH NODE, RIGHT SUBMANDIBULAR, NEEDLE CORE BIOPSY:  -Non-Hodgkin B-cell lymphoma  -See comment   COMMENT:   The sections show small needle core biopsy fragments of lymph nodal  tissue displaying effacement of the architecture predominantly by a  vaguely nodular lymphoproliferative process. The nodular areas appear  to represent atypical appearing lymphoid follicles characterized by lack  of mantle zones or polarity and a homogenous composition  of lymphocytes  with relative abundance of medium to large lymphoid cells displaying  vesicular chromatin and small nucleoli. Coalescence of atypical  follicles is seen imparting an apparently diffuse pattern in some areas.  Flow cytometric analysis was performed [facility] [phone]) andshows a  monoclonal, kappa-restricted B-cell population expressing B-cell  antigens including CD20 associated with CD10 expression. In addition,  immunohistochemical stains for CD10, CD20, CD79a, CD21, BCL-2, BCL6,  CD3, CD5, cyclin D1, and Ki-67 were performed with appropriate controls.  There is predominance of B cells as seen with CD20 and CD79a in the form  of nodular and focally diffuse areas. This is associated with CD10,  BCL6 and BCL2 positivity. CD21 highlights numerous variably sized  irregular or disrupted follicular dendritic networks. No significant  cyclin D1 positivity is identified. Ki-67 generally shows increased  expression ranging from 20% to more than 50% especially in some  follicular areas. There is an admixed T-cell population to a lesser  extent as seen with CD3 and CD5 and there is no apparent co-expression  of CD5 in B-cell areas. The overall findings are consistent with  non-Hodgkin B-cell lymphoma, follicular type. Despite small biopsy  material, thechanges favor a high-grade process. Clinical correlation  is recommended. The results were discussed with Dr. Brigitte Pulse on 02/09/2020.   GROSS DESCRIPTION:   Received in saline are 6 cores of soft white tissue measuring 0.7 to 1.3  cm in length and each less than 0.1 cm in diameter. A portion of the  specimen is placed in RPMI for flow cytometry. The remainder is  entirely submitted in 1 cassette. [facility] 02/06/2020)   Final Diagnosis performed by Susanne Greenhouse, MD.  Electronically signed  02/09/2020  Technical component performed at [facility]  RADIOGRAPHIC STUDIES: I have personally reviewed the  radiological images as listed and agreed with the findings in the report: enlarlged right cervical lymph nodes, largest approximately 1 inch in diameter.   CT SOFT TISSUE NECK W CONTRAST  Result Date: 01/30/2020 CLINICAL DATA:  Submandibular adenopathy noted at dentist appointment last week EXAM: CT NECK WITH CONTRAST TECHNIQUE: Multidetector CT imaging of the neck was performed using the standard protocol following the bolus administration of intravenous contrast. Creatinine was obtained on site at [place] at [address] Results: Creatinine 0.9 mg/dL. CONTRAST:  11m ISOVUE-300 IOPAMIDOL (ISOVUE-300) INJECTION 61% COMPARISON:  None. FINDINGS: Pharynx and larynx: No evidence of mass or swelling. Palatine tonsilliths. No thickening of Waldeyer's ring Salivary glands: No inflammation, mass, or stone. Thyroid: Subcentimeter right thyroid nodule. No followup recommended (ref: J Am Coll Radiol. 2015 Feb;12(2): 143-50). Lymph nodes: Homogeneously rounded and enlarged lymph nodes in the bilateral neck - right submandibular, bilateral jugular, and bilateral posterior triangle. The largest node is in the region of palpable marker in the right submandibular space, measuring 2.6 cm long axis. Vascular: Moderate atheromatous changes in the neck. Limited intracranial: Avidly enhancing mass which appears dural based in the low right posterior fossa, close but not clearly a originating from the right vertebral artery or PICA, 9 mm in diameter. Visualized orbits: Negative Mastoids and visualized paranasal sinuses: Clear Skeleton: Ordinary degenerative changes. No acute or aggressive finding Upper chest: Negative Other: These results will be called to the ordering clinician or representative by the Radiologist Assistant, and communication documented in the PACS or [employer] IMPRESSION: 1. Numerous enlarged lymph nodes,  especially in the right submandibular space - which is the palpable abnormality.  Lymphoma/lymphoproliferative process is favored in the absence of known granulomatous disease. Consider biopsy. 2. 9 mm mass in the low posterior fossa. 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 Especially given location, recommend imaging follow-up. Electronically Signed   By: Monte Fantasia M.D.   On: 01/30/2020 11:49   [place] CORE BIOPSY (LYMPH NODES)  Result Date: 02/06/2020 INDICATION: 79 year old with enlarged cervical lymph nodes and concern for lymphoproliferative process. Tissue diagnosis is needed. EXAM: ULTRASOUND-GUIDED LYMPH NODE BIOPSY MEDICATIONS: None. ANESTHESIA/SEDATION: Moderate (conscious) sedation was employed during this procedure. A total of Versed 1.0 mg and Fentanyl 25 mcg was administered intravenously. Moderate Sedation Time: 14 minutes. The patient's level of consciousness and vital signs were monitored continuously by radiology nursing throughout the procedure under my direct supervision. FLUOROSCOPY TIME:  None COMPLICATIONS: None immediate. PROCEDURE: Informed written consent was obtained from the patient after a thorough discussion of the procedural risks, benefits and alternatives. All questions were addressed. A timeout was performed prior to the initiation of the procedure. Ultrasound was used to identify enlarged right submandibular lymph nodes. Largest right submandibular lymph node was targeted for biopsy. The right submandibular region was prepped with chlorhexidine and sterile field was created. Skin was anesthetized with 1% lidocaine. A small incision was made. Using ultrasound guidance, 18 gauge core biopsy needle was directed into the lymph node and core biopsy was obtained. Total of 6 core biopsies were obtained. Specimens placed in saline. Bandage placed over the puncture site. FINDINGS: Enlarged rounded lymph node in the right submandibular region measuring up to 2.2 cm. This  corresponds with the recent CT findings. Core biopsy needle confirmed within the lymph node. Adequate specimens obtained and placed in saline. IMPRESSION: Successful ultrasound-guided core biopsy of the large right submandibular lymph node. Electronically Signed   By: Markus Daft M.D.   On: 02/06/2020 17:03    ASSESSMENT & PLAN Brian Castillo 79 y.o. 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  After review the labs, review the records, schedule the patient the findings most consistent with a B-cell lymphoma, most likely follicular lymphoma.  The biopsy obtained was a core biopsy and therefore the pathology report could not say with certainty of the subtype of lymphoma, though based on the tumor markers and pathological review follicular lymphoma appears most likely.  In order to complete the patient staging we will order a PET CT scan and that would help determine the course of action moving forward which could include ISRT versus chemotherapy/immunotherapy.  Labs today will include baseline CMP, CBC hepatitis C and B work-up, as well as LDH and beta-2 microglobulin for staging/prognostic purposes.  We will have the patient return in approximately 3 to 4 weeks time with interval PET CT scan.  # Non Hodgkin B Cell Lymphoma, Favor Follicular lymphoma. Staging in Process --will order a PET CT scan to determine the extent of disease for appropriate staging. --baseline labs today to include Hep B and C serologies --will order LDH and beta 2 microglobulin for prognostic evaluation --baseline CMP, CBC, uric acid --discussed the nature of lymphoma and the possible treatment options we may be consideringing including radiation and chemotherapy/immunotherapy --RTC in approximately 3-4 weeks time to review PET CT scan.   Orders Placed This Encounter  Procedures  .  NM PET Image Initial  (PI) Skull Base To Thigh    Standing Status:   Future    Standing Expiration Date:   02/11/2021    Order Specific Question:   If indicated for the ordered procedure, I authorize the administration of a radiopharmaceutical per Radiology protocol    Answer:   Yes    Order Specific Question:   Preferred imaging location?    Answer:   [facility]  . CBC with Differential (Cancer Center Only)    Standing Status:   Future    Number of Occurrences:   1    Standing Expiration Date:   02/11/2021  . CMP ([facility] only)    Standing Status:   Future    Number of Occurrences:   1    Standing Expiration Date:   02/11/2021  . Lactate dehydrogenase (LDH)    Standing Status:   Future    Number of Occurrences:   1    Standing Expiration Date:   02/11/2021  . Hepatitis B surface antigen    Standing Status:   Future    Number of Occurrences:   1    Standing Expiration Date:   02/11/2021  . Hepatitis B surface antibody    Standing Status:   Future    Number of Occurrences:   1    Standing Expiration Date:   02/11/2021  . Hepatitis B core antibody, total    Standing Status:   Future    Number of Occurrences:   1    Standing Expiration Date:   02/11/2021  . Beta 2 microglobulin, serum    Standing Status:   Future    Number of Occurrences:   1    Standing Expiration Date:   02/11/2021  . Uric acid    Standing Status:   Future    Number of Occurrences:   1    Standing Expiration Date:   02/11/2021  . Hepatitis C antibody    Standing Status:   Future    Number of Occurrences:   1    Standing Expiration Date:   02/11/2021    All questions were answered. The patient knows to call the clinic with any problems, questions or concerns.  A total of more than 60 minutes were spent on this encounter and over half of that time was spent on counseling and coordination of care as outlined above.   Ledell Peoples, MD Department of Hematology/Oncology [place] at [facility] Phone: [phone] Pager: [phone] Email: [email or web address]  02/12/2020 4:05 PM

## 2020-02-12 NOTE — Telephone Encounter (Signed)
Received a STAT referral from Dr. Virgina Jock at [facility] for [name]. [name] has been cld and scheduled to see Dr. Lorenso Courier today at 1pm. Pt aware to arrive 30 minutes early.

## 2020-02-13 ENCOUNTER — Telehealth: Payer: Self-pay | Admitting: Hematology and Oncology

## 2020-02-13 LAB — BETA 2 MICROGLOBULIN, SERUM: Beta-2 Microglobulin: 1.8 mg/L (ref 0.6–2.4)

## 2020-02-13 NOTE — Telephone Encounter (Signed)
Scheduled per los. Called and spoke with patient. Confirmed appt 

## 2020-02-14 ENCOUNTER — Telehealth: Payer: Self-pay | Admitting: Hematology and Oncology

## 2020-02-14 NOTE — Telephone Encounter (Signed)
Moved 12/20 appt to later time to accommodate provider schedule. Called and left msg. Mailed new printout

## 2020-02-22 ENCOUNTER — Ambulatory Visit (HOSPITAL_COMMUNITY)
Admission: RE | Admit: 2020-02-22 | Discharge: 2020-02-22 | Disposition: A | Discharge status: Home / Self Care | Payer: Medicare Other | Source: Ambulatory Visit | Attending: Hematology and Oncology | Admitting: Hematology and Oncology

## 2020-02-22 ENCOUNTER — Other Ambulatory Visit: Payer: Self-pay

## 2020-02-22 DIAGNOSIS — R590 Localized enlarged lymph nodes: Secondary | ICD-10-CM | POA: Insufficient documentation

## 2020-02-22 DIAGNOSIS — I700 Atherosclerosis of aorta: Secondary | ICD-10-CM | POA: Diagnosis not present

## 2020-02-22 DIAGNOSIS — C8251 Diffuse follicle center lymphoma, lymph nodes of head, face, and neck: Secondary | ICD-10-CM

## 2020-02-22 DIAGNOSIS — I2510 Atherosclerotic heart disease of native coronary artery without angina pectoris: Secondary | ICD-10-CM | POA: Diagnosis not present

## 2020-02-22 LAB — GLUCOSE, CAPILLARY: Glucose-Capillary: 108 mg/dL — ABNORMAL HIGH (ref 70–99)

## 2020-02-22 MED ORDER — FLUDEOXYGLUCOSE F - 18 (FDG) INJECTION
8.6800 | Freq: Once | INTRAVENOUS | Status: AC
  Administered 2020-02-22: 8.68 via INTRAVENOUS

## 2020-02-23 ENCOUNTER — Telehealth: Payer: Self-pay | Admitting: Hematology and Oncology

## 2020-02-23 NOTE — Telephone Encounter (Signed)
Rescheduled appt per 12/10 sch msg-  Pt is aware of appt date and time

## 2020-02-26 ENCOUNTER — Inpatient Hospital Stay: Payer: Medicare Other | Attending: Hematology and Oncology | Admitting: Hematology and Oncology

## 2020-02-26 ENCOUNTER — Other Ambulatory Visit: Payer: Self-pay

## 2020-02-26 ENCOUNTER — Encounter: Payer: Self-pay | Admitting: Hematology and Oncology

## 2020-02-26 VITALS — BP 144/59 | HR 57 | Temp 96.5°F | Resp 17 | Ht 68.0 in | Wt 178.6 lb

## 2020-02-26 DIAGNOSIS — Z8669 Personal history of other diseases of the nervous system and sense organs: Secondary | ICD-10-CM | POA: Diagnosis not present

## 2020-02-26 DIAGNOSIS — Z87891 Personal history of nicotine dependence: Secondary | ICD-10-CM | POA: Diagnosis not present

## 2020-02-26 DIAGNOSIS — M129 Arthropathy, unspecified: Secondary | ICD-10-CM | POA: Insufficient documentation

## 2020-02-26 DIAGNOSIS — Z800 Family history of malignant neoplasm of digestive organs: Secondary | ICD-10-CM | POA: Insufficient documentation

## 2020-02-26 DIAGNOSIS — Z86010 Personal history of colonic polyps: Secondary | ICD-10-CM | POA: Diagnosis not present

## 2020-02-26 DIAGNOSIS — Z7189 Other specified counseling: Secondary | ICD-10-CM

## 2020-02-26 DIAGNOSIS — Z7982 Long term (current) use of aspirin: Secondary | ICD-10-CM | POA: Diagnosis not present

## 2020-02-26 DIAGNOSIS — M5136 Other intervertebral disc degeneration, lumbar region: Secondary | ICD-10-CM | POA: Insufficient documentation

## 2020-02-26 DIAGNOSIS — C8511 Unspecified B-cell lymphoma, lymph nodes of head, face, and neck: Secondary | ICD-10-CM | POA: Diagnosis present

## 2020-02-26 DIAGNOSIS — E785 Hyperlipidemia, unspecified: Secondary | ICD-10-CM | POA: Diagnosis not present

## 2020-02-26 DIAGNOSIS — Z79899 Other long term (current) drug therapy: Secondary | ICD-10-CM | POA: Diagnosis not present

## 2020-02-26 DIAGNOSIS — I2510 Atherosclerotic heart disease of native coronary artery without angina pectoris: Secondary | ICD-10-CM | POA: Diagnosis not present

## 2020-02-26 DIAGNOSIS — C8251 Diffuse follicle center lymphoma, lymph nodes of head, face, and neck: Secondary | ICD-10-CM | POA: Diagnosis not present

## 2020-02-26 DIAGNOSIS — K219 Gastro-esophageal reflux disease without esophagitis: Secondary | ICD-10-CM | POA: Insufficient documentation

## 2020-02-26 DIAGNOSIS — M1990 Unspecified osteoarthritis, unspecified site: Secondary | ICD-10-CM | POA: Insufficient documentation

## 2020-02-26 DIAGNOSIS — I10 Essential (primary) hypertension: Secondary | ICD-10-CM | POA: Diagnosis not present

## 2020-02-26 NOTE — Progress Notes (Signed)
Brian Castillo [phone]   [phone]  PROGRESS NOTE  Patient Care Team: Shon Baton, MD as PCP - General (Internal Medicine)  Hematological/Oncological History #Follicular lymphoma Stage III 1) 01/30/2020: CT neck shows numerous enlarged lymph nodes, especially in the right submandibular space - which is the palpable abnormality. 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 Lorenso Courier  4) 02/22/2020: PET CT scan showed Deauville 4 and Deauville 5 adenopathy bilaterally in the neck. There is also a Deauville 4 left supraclavicular lymph node and a Deauville 4 right gastric lymph node  Interval History:  Brian Castillo 79 y.o. 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 The patient's last visit was on 02/12/2020 at which time he establish care. In the interim since the last visit he had a PET CT scan which we discussed today.  On exam today Brian Castillo notes that he is stable from prior. He notes the mass in his neck is approximately the same size.  He denies have any fevers, chills, sweats, nausea, vomiting or diarrhea.  A full 10 point ROS is listed below.  The bulk of our discussion today focused on the results of the PET CT scan and the treatment options moving forward.  The details of this discussion are listed below.  MEDICAL HISTORY:  Past Medical History:  Diagnosis Date  . Allergy    mild  . Arthritis   . Cataract    forming  . Colon polyps   . GERD (gastroesophageal reflux disease)    15 -20 years ago -none currently  . Glaucoma   . Hyperlipidemia   . Hypertension   . 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 Seizures ([place])    last seizure was in 1988    SURGICAL HISTORY: Past Surgical History:  Procedure Laterality Date  . CARPOMETACARPEL SUSPENSION PLASTY Right 08/24/2014   Procedure:  RIGHT CMC ARTHOPLASTY WITH DOUBLE TENDON TRANSFER AND REPAIR  RECONSTRUCTION ;  Surgeon: Roseanne Kaufman, MD;  Location: [facility];  Service: Orthopedics;  Laterality: Right;  . CHOLECYSTECTOMY    . COLONOSCOPY    . KNEE ARTHROSCOPY  2003   bilateral  . POLYPECTOMY      SOCIAL HISTORY: Social History   Socioeconomic History  . Marital status: Married    Spouse name: Not on file  . Number of children: Not on file  . Years of education: Not on file  . Highest education level: Not on file  Occupational History  . Not on file  Tobacco Use  . Smoking status: Former Research scientist (life sciences)  . Smokeless tobacco: Never Used  . Tobacco comment: quit 1984  Substance and Sexual Activity  . Alcohol use: Yes    Alcohol/week: 14.0 standard drinks    Types: 14 Glasses of wine per week  . Drug use: No  . Sexual activity: Not on file  Other Topics Concern  . Not on file  Social History Narrative  . Not on file   Social Determinants of Health   Financial Resource Strain: Not on file  Food Insecurity: Not on file  Transportation Needs: Not on file  Physical Activity: Not on file  Stress: Not on file  Social Connections: Not on file  Intimate Partner Violence: Not on file    FAMILY HISTORY: Family History  Problem Relation Age of Onset  .  Colon cancer Neg Hx   . Colon polyps Neg Hx     ALLERGIES:  is allergic to lisinopril.  MEDICATIONS:  Current Outpatient Medications  Medication Sig Dispense Refill  . amLODipine (NORVASC) 5 MG tablet Take 5 mg by mouth Daily.     [name] aspirin 81 MG tablet Take 81 mg by mouth daily.    . Calcium Carb-Cholecalciferol (CALCIUM 600 + D PO) Take 1 tablet by mouth daily.    . cholecalciferol (VITAMIN D) 1000 units tablet Take 1,000 Units by mouth daily.    . Latanoprostene Bunod (VYZULTA) 0.024 % SOLN Place 1 drop into both eyes at bedtime.    . metoprolol succinate (TOPROL-XL) 25 MG 24 hr tablet Take 25 mg by mouth Daily.     . phenytoin (DILANTIN)  100 MG ER capsule Take 200 mg by mouth 2 (two) times daily.    . pravastatin (PRAVACHOL) 80 MG tablet Take 80 mg by mouth Daily.     . timolol (BETIMOL) 0.5 % ophthalmic solution Place 1 drop into both eyes 2 (two) times daily.     No current facility-administered medications for this visit.    REVIEW OF SYSTEMS:   Constitutional: ( - ) fevers, ( - )  chills , ( - ) night sweats Eyes: ( - ) blurriness of vision, ( - ) double vision, ( - ) watery eyes Ears, nose, mouth, throat, and face: ( - ) mucositis, ( - ) sore throat Respiratory: ( - ) cough, ( - ) dyspnea, ( - ) wheezes Cardiovascular: ( - ) palpitation, ( - ) chest discomfort, ( - ) lower extremity swelling Gastrointestinal:  ( - ) nausea, ( - ) heartburn, ( - ) change in bowel habits Skin: ( - ) abnormal skin rashes Lymphatics: ( - ) new lymphadenopathy, ( - ) easy bruising Neurological: ( - ) numbness, ( - ) tingling, ( - ) new weaknesses Behavioral/Psych: ( - ) mood change, ( - ) new changes  All other systems were reviewed with the patient and are negative.  PHYSICAL EXAMINATION: ECOG PERFORMANCE STATUS: 1 - Symptomatic but completely ambulatory  Vitals:   02/26/20 1603  BP: (!) 144/59  Pulse: (!) 57  Resp: 17  Temp: (!) 96.5 F (35.8 C)  SpO2: 100%   Filed Weights   02/26/20 1603  Weight: 178 lb 9.6 oz (81 kg)    GENERAL: well appearing elderly Caucasian male in NAD  SKIN: skin color, texture, turgor are normal, no rashes or significant lesions EYES: conjunctiva are pink and non-injected, sclera clear NECK: supple, non-tender LYMPH:  Single large right sided cervical lymph node. Otherwise no palpable lymphadenopathy in the cervical, axillary or supraclavicular lymph nodes.  LUNGS: clear to auscultation and percussion with normal breathing effort HEART: regular rate & rhythm and no murmurs and no lower extremity edema Musculoskeletal: no cyanosis of digits and no clubbing  PSYCH: alert & oriented x 3, fluent  speech NEURO: no focal motor/sensory deficits  LABORATORY DATA:  I have reviewed the data as listed CBC Latest Ref Rng & Units 02/12/2020 02/06/2020 08/24/2014  WBC 4.0 - 10.5 K/uL 6.3 7.8 -  Hemoglobin 13.0 - 17.0 g/dL 13.1 14.0 14.1  Hematocrit 39.0 - 52.0 % 38.3(L) 42.1 -  Platelets 150 - 400 K/uL 164 223 -    CMP Latest Ref Rng & Units 02/12/2020 08/22/2014  Glucose 70 - 99 mg/dL 107(H) 93  BUN 8 - 23 mg/dL 21 17  Creatinine 0.61 - 1.24  mg/dL 0.84 0.83  Sodium 135 - 145 mmol/L 140 137  Potassium 3.5 - 5.1 mmol/L 4.2 4.5  Chloride 98 - 111 mmol/L 105 105  CO2 22 - 32 mmol/L 25 28  Calcium 8.9 - 10.3 mg/dL 9.1 8.8(L)  Total Protein 6.5 - 8.1 g/dL 7.1 -  Total Bilirubin 0.3 - 1.2 mg/dL 0.3 -  Alkaline Phos 38 - 126 U/L 114 -  AST 15 - 41 U/L 18 -  ALT 0 - 44 U/L 17 -   RADIOGRAPHIC STUDIES: I have personally reviewed the radiological images as listed and agreed with the findings in the report: CT scan of neck shows bilateral lymph nodes with largest node on right side of neck. PET CT scan shows same bilateral cervical lymphadenopathy with gastric lymph node nearby.   CT SOFT TISSUE NECK W CONTRAST  Result Date: 01/30/2020 CLINICAL DATA:  Submandibular adenopathy noted at dentist appointment last week EXAM: CT NECK WITH CONTRAST TECHNIQUE: Multidetector CT imaging of the neck was performed using the standard protocol following the bolus administration of intravenous contrast. Creatinine was obtained on site at [facility] at [address] Results: Creatinine 0.9 mg/dL. CONTRAST:  31mL ISOVUE-300 IOPAMIDOL (ISOVUE-300) INJECTION 61% COMPARISON:  None. FINDINGS: Pharynx and larynx: No evidence of mass or swelling. Palatine tonsilliths. No thickening of Waldeyer's ring Salivary glands: No inflammation, mass, or stone. Thyroid: Subcentimeter right thyroid nodule. No followup recommended (ref: J Am Coll Radiol. 2015 Feb;12(2): 143-50). Lymph nodes: Homogeneously rounded and  enlarged lymph nodes in the bilateral neck - right submandibular, bilateral jugular, and bilateral posterior triangle. The largest node is in the region of palpable marker in the right submandibular space, measuring 2.6 cm long axis. Vascular: Moderate atheromatous changes in the neck. Limited intracranial: Avidly enhancing mass which appears dural based in the low right posterior fossa, close but not clearly a originating from the right vertebral artery or PICA, 9 mm in diameter. Visualized orbits: Negative Mastoids and visualized paranasal sinuses: Clear Skeleton: Ordinary degenerative changes. No acute or aggressive finding Upper chest: Negative Other: These results will be called to the ordering clinician or representative by the Radiologist Assistant, and communication documented in the PACS or [employer]. IMPRESSION: 1. Numerous enlarged lymph nodes, especially in the right submandibular space - which is the palpable abnormality. Lymphoma/lymphoproliferative process is favored in the absence of known granulomatous disease. Consider biopsy. 2. 9 mm mass in the low posterior fossa. 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 Especially given location, recommend imaging follow-up. Electronically Signed   By: Monte Fantasia M.D.   On: 01/30/2020 11:49   NM PET Image Initial (PI) Skull Base To Thigh  Result Date: 02/23/2020 CLINICAL DATA:  Initial treatment strategy for lymphoma. EXAM: NUCLEAR MEDICINE PET SKULL BASE TO THIGH TECHNIQUE: 8.7 mCi F-18 FDG was injected intravenously. Full-ring PET imaging was performed from the skull base to thigh after the radiotracer. CT data was obtained and used for attenuation correction and anatomic localization. Fasting blood glucose: 108 mg/dl COMPARISON:  CT neck 01/30/2020 FINDINGS: Mediastinal blood pool activity: SUV max 2.3 Liver activity: SUV max 4.2 NECK: Bilateral  hypermetabolic adenopathy in the neck including right level Ib lymph node as well as bilateral level IIa, level IIb, left level III, left level IV, and left level V lymph nodes. The dominant right level Ib lymph node measures 2.3 cm in short axis on image 35 of series 4 and has  a maximum SUV of 14.2 comment over L5. A left level II lymph node on image 34 series 4 measures 0.9 cm in short axis and has a maximum SUV of 10.3, Deauville 5. An index left level IV lymph node measures 0.7 cm in short axis on image 46 of series 4 with maximum SUV 6.8, Deauville 4. Sh relatively symmetric palatine tonsillar activity with associated punctate calcifications, maximum SUV 5.7 on the left and 4.6 on the right. Incidental CT findings: Bilateral common carotid atherosclerotic calcification. The small suspected meningioma seen right lower posterior fossa on prior contrast enhanced CT is not well appreciated on today's PET-CT. CHEST: Left supraclavicular node 1.2 cm in short axis on image 49 series 4, maximum SUV 8.1, Deauville 4. Incidental CT findings: Coronary, aortic arch, and branch vessel atherosclerotic vascular disease. Mild peripheral bandlike opacities at the lung bases, left greater than right, with some nodular components on the left but without substantial hypermetabolic activity. A left lower lobe nodule on image 44 of series 8 measures 0.7 by 0.5 cm but is not appreciably hypermetabolic. Tree-in-bud nodularity in the right lower lobe on image 42-423 of series 8, likely from atypical infectious bronchiolitis. ABDOMEN/PELVIS: 0.8 cm right gastric lymph node on image 106 of series 4 has maximum SUV of 4.4, Deauville 4. Unusual vertical extension of very high activity in the right kidney upper pole with maximum SUV of 38.1, most likely explanation would be a calyceal diverticulum containing excreted FDG given the very high activity; this could be confirmed with renal protocol CT or MRI that includes delayed phase imaging.  Incidental CT findings: Aortoiliac atherosclerotic vascular disease. Cholecystectomy. Suspected peripelvic renal cysts. Low-grade mesenteric stranding could be from sclerosing mesenteritis or lymphoma. Densely calcified ileocolic and right lower mesenteric lymph nodes are not hypermetabolic and likely from remote inflammation. Mild prostatomegaly. SKELETON: No significant abnormal hypermetabolic activity in this region. Incidental CT findings: Grade 1 degenerative anterolisthesis at L4-5. Mild cervical, thoracic, and lumbar spondylosis. IMPRESSION: 1. Deauville 4 and Deauville 5 adenopathy bilaterally in the neck. There is also a Deauville 4 left supraclavicular lymph node and a Deauville 4 right gastric lymph node. 2. Stranding in the central mesentery could be from sclerosing mesenteritis but can also be encountered in the setting of lymphoma. 3. Very high activity extending vertically in the upper pole of the right kidney, the most likely cause would be a calyceal diverticulum containing excreted FDG. This could be confirmed with renal protocol CT or MRI if clinically warranted. 4. Densely calcified lymph nodes in the right ileocolic and central pelvic mesenteric region, probably from remote inflammation. These are not hypermetabolic. 5. Other imaging findings of potential clinical significance: Aortic Atherosclerosis (ICD10-I70.0). Coronary atherosclerosis atypical infectious bronchiolitis in the right lower lobe. Electronically Signed   By: Van Clines M.D.   On: 02/23/2020 09:33   [place] CORE BIOPSY (LYMPH NODES)  Result Date: 02/06/2020 INDICATION: 79 year old with enlarged cervical lymph nodes and concern for lymphoproliferative process. Tissue diagnosis is needed. EXAM: ULTRASOUND-GUIDED LYMPH NODE BIOPSY MEDICATIONS: None. ANESTHESIA/SEDATION: Moderate (conscious) sedation was employed during this procedure. A total of Versed 1.0 mg and Fentanyl 25 mcg was administered intravenously. Moderate  Sedation Time: 14 minutes. The patient's level of consciousness and vital signs were monitored continuously by radiology nursing throughout the procedure under my direct supervision. FLUOROSCOPY TIME:  None COMPLICATIONS: None immediate. PROCEDURE: Informed written consent was obtained from the patient after a thorough discussion of the procedural risks, benefits and alternatives. All questions were addressed. A  timeout was performed prior to the initiation of the procedure. Ultrasound was used to identify enlarged right submandibular lymph nodes. Largest right submandibular lymph node was targeted for biopsy. The right submandibular region was prepped with chlorhexidine and sterile field was created. Skin was anesthetized with 1% lidocaine. A small incision was made. Using ultrasound guidance, 18 gauge core biopsy needle was directed into the lymph node and core biopsy was obtained. Total of 6 core biopsies were obtained. Specimens placed in saline. Bandage placed over the puncture site. FINDINGS: Enlarged rounded lymph node in the right submandibular region measuring up to 2.2 cm. This corresponds with the recent CT findings. Core biopsy needle confirmed within the lymph node. Adequate specimens obtained and placed in saline. IMPRESSION: Successful ultrasound-guided core biopsy of the large right submandibular lymph node. Electronically Signed   By: Markus Daft M.D.   On: 02/06/2020 17:03    ASSESSMENT & PLAN Brian Castillo 79 y.o. 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 The patient's last visit was on 02/12/2020 at which time he establish care.  After review the labs, the records, discussion with the patient the findings are most consistent with a stage III follicular lymphoma based on the PET CT scan.  There are lymph nodes on both sides of diaphragm and therefore stage III would be the most appropriate designation.  Despite this stage III the  patient does not have any high risk features such as bulky disease, B symptoms, cytopenias, or other signs or symptoms that would require [place] to proceed with treatment.  The appropriate course at this time would be for close observation.  I would like to have the patient return to our clinic in 3 months time in order to evaluate with a repeat CT scan to determine the rate of growth and decide if treatment is appropriate.  Today our discussion focused on the nature of follicular lymphoma and the treatment options moving forward.  The observations at this time would be observation, immunotherapy, or combination of chemotherapy and immunotherapy.  Given the lack of symptoms, cytopenias, and bulky disease I do believe would be appropriate to proceed with observation alone.  We discussed rituximab monotherapy with weekly dosing x4 doses followed by every 2 months dosing x4 as a treatment option as well as bendamustine and rituximab on days 1 and 2 of a 28-day cycle x6.  The patient voiced his understanding of these options and was agreeable to proceeding with observation at this time.  #Follicular lymphoma, Stage III --PET CT findings are consistent with a Stage III follicular lymphoma with no clear indication to treat.  --recommend having the patient return in 3 months time with a repeat CT scan to evaluate the rate of growth of these lymph nodes and assure there is rapid increase in size or spread --if treatment is required consider monotherapy rituximab vs R-Bendamustine. --RTC in 3 months with repeat CT Scan.    No orders of the defined types were placed in this encounter.  All questions were answered. The patient knows to call the clinic with any problems, questions or concerns.  A total of more than 30 minutes were spent on this encounter and over half of that time was spent on counseling and coordination of care as outlined above.   Ledell Peoples, MD Department of Hematology/Oncology [place] at [facility] Phone: [phone] Pager: [phone] Email: [email or web address]  02/26/2020 5:20 PM

## 2020-02-27 ENCOUNTER — Telehealth: Payer: Self-pay | Admitting: Hematology and Oncology

## 2020-02-27 NOTE — Telephone Encounter (Signed)
Scheduled per los. Called and spoke with patient. Confirmed appt 

## 2020-03-04 ENCOUNTER — Inpatient Hospital Stay: Payer: Medicare Other | Admitting: Hematology and Oncology

## 2020-03-04 ENCOUNTER — Ambulatory Visit: Payer: Medicare Other | Admitting: Hematology and Oncology

## 2020-04-24 ENCOUNTER — Telehealth: Payer: Self-pay | Admitting: *Deleted

## 2020-04-24 ENCOUNTER — Inpatient Hospital Stay: Payer: Medicare Other

## 2020-04-24 ENCOUNTER — Inpatient Hospital Stay: Payer: Medicare Other | Attending: Hematology and Oncology | Admitting: Hematology and Oncology

## 2020-04-24 ENCOUNTER — Other Ambulatory Visit: Payer: Self-pay | Admitting: *Deleted

## 2020-04-24 ENCOUNTER — Other Ambulatory Visit: Payer: Self-pay

## 2020-04-24 VITALS — BP 135/63 | HR 67 | Temp 97.1°F | Resp 16 | Ht 68.0 in | Wt 178.8 lb

## 2020-04-24 DIAGNOSIS — K219 Gastro-esophageal reflux disease without esophagitis: Secondary | ICD-10-CM | POA: Insufficient documentation

## 2020-04-24 DIAGNOSIS — Z7982 Long term (current) use of aspirin: Secondary | ICD-10-CM | POA: Insufficient documentation

## 2020-04-24 DIAGNOSIS — C8511 Unspecified B-cell lymphoma, lymph nodes of head, face, and neck: Secondary | ICD-10-CM | POA: Insufficient documentation

## 2020-04-24 DIAGNOSIS — I10 Essential (primary) hypertension: Secondary | ICD-10-CM | POA: Diagnosis not present

## 2020-04-24 DIAGNOSIS — Z79899 Other long term (current) drug therapy: Secondary | ICD-10-CM | POA: Diagnosis not present

## 2020-04-24 DIAGNOSIS — E785 Hyperlipidemia, unspecified: Secondary | ICD-10-CM | POA: Insufficient documentation

## 2020-04-24 DIAGNOSIS — C8251 Diffuse follicle center lymphoma, lymph nodes of head, face, and neck: Secondary | ICD-10-CM

## 2020-04-24 DIAGNOSIS — Z7189 Other specified counseling: Secondary | ICD-10-CM | POA: Diagnosis not present

## 2020-04-24 DIAGNOSIS — M1990 Unspecified osteoarthritis, unspecified site: Secondary | ICD-10-CM | POA: Diagnosis not present

## 2020-04-24 DIAGNOSIS — M129 Arthropathy, unspecified: Secondary | ICD-10-CM | POA: Insufficient documentation

## 2020-04-24 DIAGNOSIS — Z86010 Personal history of colonic polyps: Secondary | ICD-10-CM | POA: Insufficient documentation

## 2020-04-24 LAB — CBC WITH DIFFERENTIAL (CANCER CENTER ONLY)
Abs Immature Granulocytes: 0.02 10*3/uL (ref 0.00–0.07)
Basophils Absolute: 0.1 10*3/uL (ref 0.0–0.1)
Basophils Relative: 1 %
Eosinophils Absolute: 0.3 10*3/uL (ref 0.0–0.5)
Eosinophils Relative: 3 %
HCT: 39.8 % (ref 39.0–52.0)
Hemoglobin: 13.5 g/dL (ref 13.0–17.0)
Immature Granulocytes: 0 %
Lymphocytes Relative: 16 %
Lymphs Abs: 1.5 10*3/uL (ref 0.7–4.0)
MCH: 32.3 pg (ref 26.0–34.0)
MCHC: 33.9 g/dL (ref 30.0–36.0)
MCV: 95.2 fL (ref 80.0–100.0)
Monocytes Absolute: 0.6 10*3/uL (ref 0.1–1.0)
Monocytes Relative: 7 %
Neutro Abs: 7 10*3/uL (ref 1.7–7.7)
Neutrophils Relative %: 73 %
Platelet Count: 161 10*3/uL (ref 150–400)
RBC: 4.18 MIL/uL — ABNORMAL LOW (ref 4.22–5.81)
RDW: 13.1 % (ref 11.5–15.5)
WBC Count: 9.5 10*3/uL (ref 4.0–10.5)
nRBC: 0 % (ref 0.0–0.2)

## 2020-04-24 LAB — CMP (CANCER CENTER ONLY)
ALT: 16 U/L (ref 0–44)
AST: 17 U/L (ref 15–41)
Albumin: 4.2 g/dL (ref 3.5–5.0)
Alkaline Phosphatase: 119 U/L (ref 38–126)
Anion gap: 7 (ref 5–15)
BUN: 21 mg/dL (ref 8–23)
CO2: 26 mmol/L (ref 22–32)
Calcium: 8.9 mg/dL (ref 8.9–10.3)
Chloride: 106 mmol/L (ref 98–111)
Creatinine: 0.81 mg/dL (ref 0.61–1.24)
GFR, Estimated: >60 mL/min (ref >60)
Glucose, Bld: 118 mg/dL — ABNORMAL HIGH (ref 70–99)
Potassium: 4.5 mmol/L (ref 3.5–5.1)
Sodium: 139 mmol/L (ref 135–145)
Total Bilirubin: 0.3 mg/dL (ref 0.3–1.2)
Total Protein: 7.2 g/dL (ref 6.5–8.1)

## 2020-04-24 LAB — LACTATE DEHYDROGENASE: LDH: 190 U/L (ref 98–192)

## 2020-04-24 NOTE — Telephone Encounter (Signed)
Received call from pt this morning. He states the swelling under his chin has doubled in size over the last 3-4 days.  It is causing pain when he swallows and pressure in his ears.  He denies fever,  Chills, night sweats, nasal congestion. Discussed with Dr. Lorenso Courier. Informed patient that we can see him today for labs@ 3:30 pm and then Dr. Lorenso Courier @ 4pm. Pt is very agreeable to this plan. High priority scheduling message sent. Orders for labs placed.

## 2020-04-27 ENCOUNTER — Encounter: Payer: Self-pay | Admitting: Hematology and Oncology

## 2020-04-27 NOTE — Progress Notes (Signed)
[place] [phone]   [phone]  PROGRESS NOTE  Patient Care Team: Shon Baton, MD as PCP - General (Internal Medicine)  Hematological/Oncological History #Follicular lymphoma Stage III 1) 01/30/2020: CT neck shows numerous enlarged lymph nodes, especially in the right submandibular space - which is the palpable abnormality. 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 Lorenso Courier  4) 02/22/2020: PET CT scan showed Deauville 4 and Deauville 5 adenopathy bilaterally in the neck. There is also a Deauville 4 left supraclavicular lymph node and a Deauville 4 right gastric lymph node 5) 04/24/2020: presents with rapid enlargement of his right submandibular lymph node.   Interval History:  Brian Castillo 80 y.o. 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 The patient's last visit was on 02/26/2020. In the interim since the last visit he has had rapid expansion of his submandibular lymph node.  He presents now for an urgent visit there is previously unscheduled due to this enlarging lymph node.  On exam today Brian Castillo notes over the last week his submandibular lymph node has nearly doubled in size and is becoming tender.  He notes that it is tender to palpation and does have some overlying erythema.  He is having some mild difficulty when swallowing, but is not having any obstruction of his breathing.  He has not been having any issues with fevers, chills, sweats, nausea, vomiting or diarrhea.  None of his other lymph nodes appear enlarged at this time.  A full 10 point ROS is listed below.  MEDICAL HISTORY:  Past Medical History:  Diagnosis Date  . Allergy    mild  . Arthritis   . Cataract    forming  . Colon polyps   . GERD (gastroesophageal reflux disease)    15 -20 years ago -none currently  . Glaucoma   .  Hyperlipidemia   . Hypertension   . 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 Seizures ([place])    last seizure was in 1988    SURGICAL HISTORY: Past Surgical History:  Procedure Laterality Date  . CARPOMETACARPEL SUSPENSION PLASTY Right 08/24/2014   Procedure: RIGHT CMC ARTHOPLASTY WITH DOUBLE TENDON TRANSFER AND REPAIR  RECONSTRUCTION ;  Surgeon: Roseanne Kaufman, MD;  Location: [facility];  Service: Orthopedics;  Laterality: Right;  . CHOLECYSTECTOMY    . COLONOSCOPY    . KNEE ARTHROSCOPY  2003   bilateral  . POLYPECTOMY      SOCIAL HISTORY: Social History   Socioeconomic History  . Marital status: Married    Spouse name: Not on file  . Number of children: Not on file  . Years of education: Not on file  . Highest education level: Not on file  Occupational History  . Not on file  Tobacco Use  . Smoking status: Former Research scientist (life sciences)  . Smokeless tobacco: Never Used  . Tobacco comment: quit 1984  Substance and Sexual Activity  . Alcohol use: Yes    Alcohol/week: 14.0 standard drinks    Types: 14 Glasses of wine per week  . Drug use: No  . Sexual activity: Not on file  Other Topics Concern  . Not on file  Social History Narrative  . Not on file   Social Determinants of Health   Financial Resource Strain: Not on file  Food Insecurity: Not on file  Transportation Needs:  Not on file  Physical Activity: Not on file  Stress: Not on file  Social Connections: Not on file  Intimate Partner Violence: Not on file    FAMILY HISTORY: Family History  Problem Relation Age of Onset  . Colon cancer Neg Hx   . Colon polyps Neg Hx     ALLERGIES:  is allergic to lisinopril.  MEDICATIONS:  Current Outpatient Medications  Medication Sig Dispense Refill  . amLODipine (NORVASC) 5 MG tablet Take 5 mg by mouth Daily.     [name] aspirin 81 MG tablet Take 81 mg by mouth daily.    . Calcium Carb-Cholecalciferol (CALCIUM 600 + D PO) Take 1 tablet by mouth daily.    .  cholecalciferol (VITAMIN D) 1000 units tablet Take 1,000 Units by mouth daily.    . Latanoprostene Bunod (VYZULTA) 0.024 % SOLN Place 1 drop into both eyes at bedtime.    . metoprolol succinate (TOPROL-XL) 25 MG 24 hr tablet Take 25 mg by mouth Daily.     . phenytoin (DILANTIN) 100 MG ER capsule Take 200 mg by mouth 2 (two) times daily.    . pravastatin (PRAVACHOL) 80 MG tablet Take 80 mg by mouth Daily.     . timolol (BETIMOL) 0.5 % ophthalmic solution Place 1 drop into both eyes 2 (two) times daily.     No current facility-administered medications for this visit.    REVIEW OF SYSTEMS:   Constitutional: ( - ) fevers, ( - )  chills , ( - ) night sweats Eyes: ( - ) blurriness of vision, ( - ) double vision, ( - ) watery eyes Ears, nose, mouth, throat, and face: ( - ) mucositis, ( - ) sore throat Respiratory: ( - ) cough, ( - ) dyspnea, ( - ) wheezes Cardiovascular: ( - ) palpitation, ( - ) chest discomfort, ( - ) lower extremity swelling Gastrointestinal:  ( - ) nausea, ( - ) heartburn, ( - ) change in bowel habits Skin: ( - ) abnormal skin rashes Lymphatics: ( - ) new lymphadenopathy, ( - ) easy bruising Neurological: ( - ) numbness, ( - ) tingling, ( - ) new weaknesses Behavioral/Psych: ( - ) mood change, ( - ) new changes  All other systems were reviewed with the patient and are negative.  PHYSICAL EXAMINATION: ECOG PERFORMANCE STATUS: 1 - Symptomatic but completely ambulatory  Vitals:   04/24/20 1541  BP: 135/63  Pulse: 67  Resp: 16  Temp: (!) 97.1 F (36.2 C)  SpO2: 100%   Filed Weights   04/24/20 1541  Weight: 178 lb 12.8 oz (81.1 kg)    GENERAL: well appearing elderly Caucasian male in NAD  SKIN: skin color, texture, turgor are normal, no rashes or significant lesions EYES: conjunctiva are pink and non-injected, sclera clear NECK: supple, non-tender LYMPH:  Single large right sided cervical lymph node, now considerably more enlarged. Otherwise no palpable  lymphadenopathy in the cervical, axillary or supraclavicular lymph nodes.  LUNGS: clear to auscultation and percussion with normal breathing effort HEART: regular rate & rhythm and no murmurs and no lower extremity edema Musculoskeletal: no cyanosis of digits and no clubbing  PSYCH: alert & oriented x 3, fluent speech NEURO: no focal motor/sensory deficits  LABORATORY DATA:  I have reviewed the data as listed CBC Latest Ref Rng & Units 04/24/2020 02/12/2020 02/06/2020  WBC 4.0 - 10.5 K/uL 9.5 6.3 7.8  Hemoglobin 13.0 - 17.0 g/dL 13.5 13.1 14.0  Hematocrit 39.0 - 52.0 %  39.8 38.3(L) 42.1  Platelets 150 - 400 K/uL 161 164 223    CMP Latest Ref Rng & Units 04/24/2020 02/12/2020 08/22/2014  Glucose 70 - 99 mg/dL 118(H) 107(H) 93  BUN 8 - 23 mg/dL 21 21 17   Creatinine 0.61 - 1.24 mg/dL 0.81 0.84 0.83  Sodium 135 - 145 mmol/L 139 140 137  Potassium 3.5 - 5.1 mmol/L 4.5 4.2 4.5  Chloride 98 - 111 mmol/L 106 105 105  CO2 22 - 32 mmol/L 26 25 28   Calcium 8.9 - 10.3 mg/dL 8.9 9.1 8.8(L)  Total Protein 6.5 - 8.1 g/dL 7.2 7.1 -  Total Bilirubin 0.3 - 1.2 mg/dL 0.3 0.3 -  Alkaline Phos 38 - 126 U/L 119 114 -  AST 15 - 41 U/L 17 18 -  ALT 0 - 44 U/L 16 17 -   RADIOGRAPHIC STUDIES: I have personally reviewed the radiological images as listed and agreed with the findings in the report: CT scan of neck shows bilateral lymph nodes with largest node on right side of neck. PET CT scan shows same bilateral cervical lymphadenopathy with gastric lymph node nearby.   No results found.  ASSESSMENT & PLAN Brian Castillo 80 y.o. 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 The patient's last visit was on 02/12/2020 at which time he establish care.  After review the labs, the records, discussion with the patient the findings are most consistent with a stage III follicular lymphoma based on the PET CT scan.  There are lymph nodes on both sides of  diaphragm and therefore stage III would be the most appropriate designation.  Despite this stage III the patient does not have any high risk features such as bulky disease, B symptoms, cytopenias, or other signs or symptoms that would require [place] to proceed with treatment.  The appropriate course at this time would be for close observation.  I would like to have the patient return to our clinic in 3 months time in order to evaluate with a repeat CT scan to determine the rate of growth and decide if treatment is appropriate.  Previously we discussed the nature of follicular lymphoma and the treatment options moving forward.  The observations at this time would be observation, immunotherapy, or combination of chemotherapy and immunotherapy.  Given the lack of symptoms, cytopenias, and bulky disease I do believe would be appropriate to proceed with observation alone.  We discussed rituximab monotherapy with weekly dosing x4 doses followed by every 2 months dosing x4 as a treatment option as well as bendamustine and rituximab on days 1 and 2 of a 28-day cycle x6.  The patient voiced his understanding of these options and was agreeable to proceeding with observation at this time.  During today's visit the patient is found to have a rapidly enlarging right submandibular lymph node.  Due to concern for this enlarging lymph node we will order a CT scan of the chest abdomen pelvis to assess for progression elsewhere.  We may need to have an excisional biopsy of that submandibular lymph node to assure that it has not converted into a more aggressive form of lymphoma.  #Follicular lymphoma, Stage III --prior PET CT findings are consistent with a Stage III follicular lymphoma with no clear indication to treat.  --Patient presents today with a single enlarging submandibular lymph node.  We will order a CT scan of the neck in order to assess for progression of this lesion.  Additionally we will get his regularly scheduled  follow-up CT chest abdomen pelvis ASAP as well --Pending the results of the CT scan we will consider referral to ENT for excision in order to assure this is not converted into a more aggressive lymphoma.  We will also need to consider systemic therapy. --if treatment is required consider monotherapy rituximab vs R-Bendamustine. --RTC in 3 months with repeat CT Scan.    No orders of the defined types were placed in this encounter.  All questions were answered. The patient knows to call the clinic with any problems, questions or concerns.  A total of more than 30 minutes were spent on this encounter and over half of that time was spent on counseling and coordination of care as outlined above.   Ledell Peoples, MD Department of Hematology/Oncology [place] at [facility] Phone: [phone] Pager: [phone] Email: [email or web address]  04/27/2020 4:08 PM

## 2020-04-29 ENCOUNTER — Other Ambulatory Visit: Payer: Self-pay

## 2020-04-29 ENCOUNTER — Ambulatory Visit (HOSPITAL_COMMUNITY)
Admission: RE | Admit: 2020-04-29 | Discharge: 2020-04-29 | Disposition: A | Discharge status: Home / Self Care | Payer: Medicare Other | Source: Ambulatory Visit | Attending: Hematology and Oncology | Admitting: Hematology and Oncology

## 2020-04-29 DIAGNOSIS — R918 Other nonspecific abnormal finding of lung field: Secondary | ICD-10-CM | POA: Diagnosis not present

## 2020-04-29 DIAGNOSIS — R590 Localized enlarged lymph nodes: Secondary | ICD-10-CM | POA: Insufficient documentation

## 2020-04-29 DIAGNOSIS — I6522 Occlusion and stenosis of left carotid artery: Secondary | ICD-10-CM | POA: Diagnosis not present

## 2020-04-29 DIAGNOSIS — C8251 Diffuse follicle center lymphoma, lymph nodes of head, face, and neck: Secondary | ICD-10-CM

## 2020-04-29 DIAGNOSIS — I700 Atherosclerosis of aorta: Secondary | ICD-10-CM | POA: Diagnosis not present

## 2020-04-29 MED ORDER — IOHEXOL 300 MG/ML  SOLN
100.0000 mL | Freq: Once | INTRAMUSCULAR | Status: AC | PRN
  Administered 2020-04-29: 100 mL via INTRAVENOUS

## 2020-04-30 ENCOUNTER — Telehealth: Payer: Self-pay | Admitting: Hematology and Oncology

## 2020-04-30 NOTE — Telephone Encounter (Signed)
Called Mr. Marchuk to discuss the results of his CT scan of the chest and the neck.  Spoke with the patient's wife instead.  Reported to her that the findings show that only the right cervical lymph node is enlarged and that there is a necrotic center with some fluid noted.  Spoke with ENT regarding his case and they wanted to see the patient in clinic for consideration of aspiration and partial excision of this lymph node.  I think this is a good idea moving forward and will make the connect to ENT on [address] with [facility].  Ledell Peoples, MD Department of Hematology/Oncology [place] at [facility] Phone: [phone] Pager: [phone] Email: [email or web address]

## 2020-05-01 ENCOUNTER — Other Ambulatory Visit: Payer: Self-pay | Admitting: *Deleted

## 2020-05-01 DIAGNOSIS — C8251 Diffuse follicle center lymphoma, lymph nodes of head, face, and neck: Secondary | ICD-10-CM

## 2020-05-03 ENCOUNTER — Telehealth: Payer: Self-pay | Admitting: *Deleted

## 2020-05-03 ENCOUNTER — Encounter: Payer: Self-pay | Admitting: *Deleted

## 2020-05-03 NOTE — Telephone Encounter (Signed)
[name] patient regarding his referral to Dr. Redmond Baseman, ENT. Spoke with patient and he states that he has an appt on 05/07/20 @ 1:45 pm

## 2020-05-09 ENCOUNTER — Telehealth: Payer: Self-pay | Admitting: *Deleted

## 2020-05-09 ENCOUNTER — Encounter: Payer: Self-pay | Admitting: *Deleted

## 2020-05-09 NOTE — Telephone Encounter (Signed)
[name] patient to see how his appt with Dr. Redmond Baseman went.  Spoke with  Arbie Cookey, pt's wife. She states Dr. Redmond Baseman was not able to obtain any fluid from the enlarged lymph node in his right neck.  Pt is now scheduled for an excisional lymph node biopsy on 05/20/20  Dr. Lorenso Courier made aware.

## 2020-05-20 ENCOUNTER — Other Ambulatory Visit (HOSPITAL_COMMUNITY)
Admission: RE | Admit: 2020-05-20 | Discharge: 2020-05-20 | Disposition: A | Discharge status: Home / Self Care | Payer: Medicare Other | Source: Ambulatory Visit | Attending: Otolaryngology | Admitting: Otolaryngology

## 2020-05-21 ENCOUNTER — Telehealth: Payer: Self-pay

## 2020-05-21 NOTE — Telephone Encounter (Signed)
Patient called office stating he had a biopsy completed on 05/20/20 and is scheduled for a follow-up appointment with Dr. Redmond Baseman on 05/27/20 at 4pm, the same time he is scheduled to be seen by Dr. Lorenso Courier.   Patient wants to know if he needs to reschedule his appointments on 05/27/20 with Dr. Lorenso Courier.

## 2020-05-22 ENCOUNTER — Telehealth: Payer: Self-pay | Admitting: *Deleted

## 2020-05-22 ENCOUNTER — Telehealth: Payer: Self-pay | Admitting: Hematology and Oncology

## 2020-05-22 LAB — SURGICAL PATHOLOGY

## 2020-05-22 NOTE — Telephone Encounter (Signed)
Rescheduled upcoming appointment per 3/9 schedule message. Patient is aware of changes.

## 2020-05-22 NOTE — Telephone Encounter (Signed)
[name] patient regarding appt change request. Pt has a follow up appt with Dr. Redmond Baseman at the same time of Dr. Libby Maw appt on 05/27/20.  Call made to inform patient of new appt day and time on 05/29/20. He voiced understanding. He states he is doing well after ENT surgery on 05/20/20

## 2020-05-27 ENCOUNTER — Inpatient Hospital Stay: Payer: Medicare Other

## 2020-05-27 ENCOUNTER — Inpatient Hospital Stay: Payer: Medicare Other | Admitting: Hematology and Oncology

## 2020-05-29 ENCOUNTER — Inpatient Hospital Stay: Payer: Medicare Other | Attending: Hematology and Oncology

## 2020-05-29 ENCOUNTER — Inpatient Hospital Stay: Payer: Medicare Other | Admitting: Hematology and Oncology

## 2020-05-29 ENCOUNTER — Other Ambulatory Visit: Payer: Self-pay | Admitting: Hematology and Oncology

## 2020-05-29 ENCOUNTER — Encounter: Payer: Self-pay | Admitting: Hematology and Oncology

## 2020-05-29 ENCOUNTER — Other Ambulatory Visit: Payer: Self-pay

## 2020-05-29 VITALS — BP 134/64 | HR 52 | Temp 96.5°F | Resp 18 | Ht 68.0 in | Wt 177.5 lb

## 2020-05-29 DIAGNOSIS — Z87891 Personal history of nicotine dependence: Secondary | ICD-10-CM | POA: Diagnosis not present

## 2020-05-29 DIAGNOSIS — Z86010 Personal history of colonic polyps: Secondary | ICD-10-CM | POA: Insufficient documentation

## 2020-05-29 DIAGNOSIS — C8511 Unspecified B-cell lymphoma, lymph nodes of head, face, and neck: Secondary | ICD-10-CM | POA: Diagnosis present

## 2020-05-29 DIAGNOSIS — C8251 Diffuse follicle center lymphoma, lymph nodes of head, face, and neck: Secondary | ICD-10-CM | POA: Diagnosis not present

## 2020-05-29 DIAGNOSIS — M129 Arthropathy, unspecified: Secondary | ICD-10-CM | POA: Diagnosis not present

## 2020-05-29 DIAGNOSIS — E785 Hyperlipidemia, unspecified: Secondary | ICD-10-CM | POA: Insufficient documentation

## 2020-05-29 DIAGNOSIS — I10 Essential (primary) hypertension: Secondary | ICD-10-CM | POA: Diagnosis not present

## 2020-05-29 DIAGNOSIS — M1990 Unspecified osteoarthritis, unspecified site: Secondary | ICD-10-CM | POA: Insufficient documentation

## 2020-05-29 DIAGNOSIS — K219 Gastro-esophageal reflux disease without esophagitis: Secondary | ICD-10-CM | POA: Diagnosis not present

## 2020-05-29 DIAGNOSIS — Z79899 Other long term (current) drug therapy: Secondary | ICD-10-CM | POA: Diagnosis not present

## 2020-05-29 LAB — CMP (CANCER CENTER ONLY)
ALT: 16 U/L (ref 0–44)
AST: 17 U/L (ref 15–41)
Albumin: 4 g/dL (ref 3.5–5.0)
Alkaline Phosphatase: 121 U/L (ref 38–126)
Anion gap: 6 (ref 5–15)
BUN: 21 mg/dL (ref 8–23)
CO2: 26 mmol/L (ref 22–32)
Calcium: 8.8 mg/dL — ABNORMAL LOW (ref 8.9–10.3)
Chloride: 104 mmol/L (ref 98–111)
Creatinine: 0.79 mg/dL (ref 0.61–1.24)
GFR, Estimated: >60 mL/min (ref >60)
Glucose, Bld: 106 mg/dL — ABNORMAL HIGH (ref 70–99)
Potassium: 4.8 mmol/L (ref 3.5–5.1)
Sodium: 136 mmol/L (ref 135–145)
Total Bilirubin: 0.4 mg/dL (ref 0.3–1.2)
Total Protein: 7 g/dL (ref 6.5–8.1)

## 2020-05-29 LAB — CBC WITH DIFFERENTIAL (CANCER CENTER ONLY)
Abs Immature Granulocytes: 0.01 10*3/uL (ref 0.00–0.07)
Basophils Absolute: 0 10*3/uL (ref 0.0–0.1)
Basophils Relative: 1 %
Eosinophils Absolute: 0.3 10*3/uL (ref 0.0–0.5)
Eosinophils Relative: 4 %
HCT: 38.3 % — ABNORMAL LOW (ref 39.0–52.0)
Hemoglobin: 13.4 g/dL (ref 13.0–17.0)
Immature Granulocytes: 0 %
Lymphocytes Relative: 19 %
Lymphs Abs: 1.3 10*3/uL (ref 0.7–4.0)
MCH: 32.7 pg (ref 26.0–34.0)
MCHC: 35 g/dL (ref 30.0–36.0)
MCV: 93.4 fL (ref 80.0–100.0)
Monocytes Absolute: 0.5 10*3/uL (ref 0.1–1.0)
Monocytes Relative: 7 %
Neutro Abs: 4.4 10*3/uL (ref 1.7–7.7)
Neutrophils Relative %: 69 %
Platelet Count: 148 10*3/uL — ABNORMAL LOW (ref 150–400)
RBC: 4.1 MIL/uL — ABNORMAL LOW (ref 4.22–5.81)
RDW: 12.6 % (ref 11.5–15.5)
WBC Count: 6.5 10*3/uL (ref 4.0–10.5)
nRBC: 0 % (ref 0.0–0.2)

## 2020-05-29 LAB — LACTATE DEHYDROGENASE: LDH: 179 U/L (ref 98–192)

## 2020-05-29 MED ORDER — ACYCLOVIR 400 MG PO TABS: 400.0000 mg | ORAL_TABLET | Freq: Two times a day (BID) | ORAL | 5 refills | Status: DC

## 2020-05-29 MED ORDER — ONDANSETRON HCL 8 MG PO TABS: 8.0000 mg | ORAL_TABLET | Freq: Three times a day (TID) | ORAL | 0 refills | Status: DC | PRN

## 2020-05-29 MED ORDER — DEXAMETHASONE 4 MG PO TABS: 8.0000 mg | ORAL_TABLET | ORAL | 5 refills | Status: DC

## 2020-05-29 MED ORDER — PROCHLORPERAZINE MALEATE 10 MG PO TABS: 10.0000 mg | ORAL_TABLET | Freq: Four times a day (QID) | ORAL | 0 refills | Status: DC | PRN

## 2020-05-29 MED ORDER — ALLOPURINOL 300 MG PO TABS: 300.0000 mg | ORAL_TABLET | Freq: Every day | ORAL | 1 refills | Status: DC

## 2020-05-29 NOTE — Progress Notes (Signed)
START ON PATHWAY REGIMEN - Lymphoma and CLL     A cycle is every 28 days:     Rituximab-xxxx      Bendamustine   **Always confirm dose/schedule in your pharmacy ordering system**  Patient Characteristics: Follicular Lymphoma, Grades 1, 2, and 3A, First Line, Stage III / IV, Symptomatic or Bulky Disease Disease Type: Follicular Lymphoma, Grade 1, 2, or 3A Disease Type: Not Applicable Disease Type: Not Applicable Line of Therapy: First Line Disease Characteristics: Symptomatic or Bulky Disease Intent of Therapy: Curative Intent, Discussed with Patient 

## 2020-05-29 NOTE — Progress Notes (Signed)
[facility] [phone]   [phone]  PROGRESS NOTE  Patient Care Team: Shon Baton, MD as PCP - General (Internal Medicine)  Hematological/Oncological History #Follicular lymphoma Stage III 1) 01/30/2020: CT neck shows numerous enlarged lymph nodes, especially in the right submandibular space - which is the palpable abnormality. 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 Lorenso Courier  4) 02/22/2020: PET CT scan showed Deauville 4 and Deauville 5 adenopathy bilaterally in the neck. There is also a Deauville 4 left supraclavicular lymph node and a Deauville 4 right gastric lymph node 5) 04/24/2020: presents with rapid enlargement of his right submandibular lymph node.  6) 05/20/2020: partial excision of right submandibular lymph node with attempted fluid drainage. Pathology consistent with follicular lymphoma, no clear evidence of transformation.   Interval History:  DAILY CRATE 80 y.o. 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 The patient's last visit was on 04/24/2020. In the interim since the last visit he has had a surgical excision of his rapidly enlarging submandibular lymph node.   On exam today Mr. Rivere notes he has been well in the interim since her last visit.  He is accompanied by his wife today.  He reports that the excision with a biopsy was obtained has healed quite well.  He has had no drainage or tenderness.  There is some erythema and he does feel the lump rubbing on the color of his shirt.  He notes that it is warm and does occasionally itch but does not interfere with his day-to-day life.  He has not been having any issues with fevers, chills, sweats, nausea, vomiting or diarrhea.  None of his other lymph nodes appear enlarged at this time.  A full 10 point ROS is listed below.  MEDICAL HISTORY:  Past  Medical History:  Diagnosis Date  . Allergy    mild  . Arthritis   . Cataract    forming  . Colon polyps   . GERD (gastroesophageal reflux disease)    15 -20 years ago -none currently  . Glaucoma   . Hyperlipidemia   . Hypertension   . 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 Seizures ([place])    last seizure was in 1988    SURGICAL HISTORY: Past Surgical History:  Procedure Laterality Date  . CARPOMETACARPEL SUSPENSION PLASTY Right 08/24/2014   Procedure: RIGHT CMC ARTHOPLASTY WITH DOUBLE TENDON TRANSFER AND REPAIR  RECONSTRUCTION ;  Surgeon: Roseanne Kaufman, MD;  Location: [facility];  Service: Orthopedics;  Laterality: Right;  . CHOLECYSTECTOMY    . COLONOSCOPY    . KNEE ARTHROSCOPY  2003   bilateral  . POLYPECTOMY      SOCIAL HISTORY: Social History   Socioeconomic History  . Marital status: Married    Spouse name: Not on file  . Number of children: Not on file  . Years of education: Not on file  . Highest education level: Not on file  Occupational History  . Not on file  Tobacco Use  . Smoking status: Former Research scientist (life sciences)  . Smokeless tobacco: Never Used  . Tobacco comment: quit 1984  Substance and Sexual Activity  . Alcohol use: Yes    Alcohol/week: 14.0 standard drinks    Types: 14 Glasses of wine per week  . Drug use: No  . Sexual activity: Not on file  Other  Topics Concern  . Not on file  Social History Narrative  . Not on file   Social Determinants of Health   Financial Resource Strain: Not on file  Food Insecurity: Not on file  Transportation Needs: Not on file  Physical Activity: Not on file  Stress: Not on file  Social Connections: Not on file  Intimate Partner Violence: Not on file    FAMILY HISTORY: Family History  Problem Relation Age of Onset  . Colon cancer Neg Hx   . Colon polyps Neg Hx     ALLERGIES:  is allergic to brimonidine tartrate-timolol, lisinopril, and netarsudil dimesylate.  MEDICATIONS:  Current  Outpatient Medications  Medication Sig Dispense Refill  . cephALEXin (KEFLEX) 500 MG capsule Take by mouth.    [name] amLODipine (NORVASC) 5 MG tablet Take 5 mg by mouth Daily.     [name] aspirin 81 MG tablet Take 81 mg by mouth daily.    . Calcium Carb-Cholecalciferol (CALCIUM 600 + D PO) Take 1 tablet by mouth daily.    . cholecalciferol (VITAMIN D) 1000 units tablet Take 1,000 Units by mouth daily.    . Latanoprostene Bunod (VYZULTA) 0.024 % SOLN Place 1 drop into both eyes at bedtime.    . metoprolol succinate (TOPROL-XL) 25 MG 24 hr tablet Take 25 mg by mouth Daily.     . phenytoin (DILANTIN) 100 MG ER capsule Take 200 mg by mouth 2 (two) times daily.    . pravastatin (PRAVACHOL) 80 MG tablet Take 80 mg by mouth Daily.     . timolol (BETIMOL) 0.5 % ophthalmic solution Place 1 drop into both eyes 2 (two) times daily.     No current facility-administered medications for this visit.    REVIEW OF SYSTEMS:   Constitutional: ( - ) fevers, ( - )  chills , ( - ) night sweats Eyes: ( - ) blurriness of vision, ( - ) double vision, ( - ) watery eyes Ears, nose, mouth, throat, and face: ( - ) mucositis, ( - ) sore throat Respiratory: ( - ) cough, ( - ) dyspnea, ( - ) wheezes Cardiovascular: ( - ) palpitation, ( - ) chest discomfort, ( - ) lower extremity swelling Gastrointestinal:  ( - ) nausea, ( - ) heartburn, ( - ) change in bowel habits Skin: ( - ) abnormal skin rashes Lymphatics: ( - ) new lymphadenopathy, ( - ) easy bruising Neurological: ( - ) numbness, ( - ) tingling, ( - ) new weaknesses Behavioral/Psych: ( - ) mood change, ( - ) new changes  All other systems were reviewed with the patient and are negative.  PHYSICAL EXAMINATION: ECOG PERFORMANCE STATUS: 1 - Symptomatic but completely ambulatory  Vitals:   05/29/20 1347  BP: 134/64  Pulse: (!) 52  Resp: 18  Temp: (!) 96.5 F (35.8 C)  SpO2: 99%   Filed Weights   05/29/20 1347  Weight: 177 lb 8 oz (80.5 kg)    GENERAL: well  appearing elderly Caucasian male in NAD  SKIN: skin color, texture, turgor are normal, no rashes or significant lesions EYES: conjunctiva are pink and non-injected, sclera clear NECK: supple, non-tender LYMPH:  Single large right sided cervical lymph node, continuing to enlarge. Otherwise no marked palpable lymphadenopathy in the cervical, axillary or supraclavicular lymph nodes.  LUNGS: clear to auscultation and percussion with normal breathing effort HEART: regular rate & rhythm and no murmurs and no lower extremity edema Musculoskeletal: no cyanosis of digits and no clubbing  PSYCH: alert & oriented x 3, fluent speech NEURO: no focal motor/sensory deficits  LABORATORY DATA:  I have reviewed the data as listed CBC Latest Ref Rng & Units 05/29/2020 04/24/2020 02/12/2020  WBC 4.0 - 10.5 K/uL 6.5 9.5 6.3  Hemoglobin 13.0 - 17.0 g/dL 13.4 13.5 13.1  Hematocrit 39.0 - 52.0 % 38.3(L) 39.8 38.3(L)  Platelets 150 - 400 K/uL 148(L) 161 164    CMP Latest Ref Rng & Units 04/24/2020 02/12/2020 08/22/2014  Glucose 70 - 99 mg/dL 118(H) 107(H) 93  BUN 8 - 23 mg/dL 21 21 17   Creatinine 0.61 - 1.24 mg/dL 0.81 0.84 0.83  Sodium 135 - 145 mmol/L 139 140 137  Potassium 3.5 - 5.1 mmol/L 4.5 4.2 4.5  Chloride 98 - 111 mmol/L 106 105 105  CO2 22 - 32 mmol/L 26 25 28   Calcium 8.9 - 10.3 mg/dL 8.9 9.1 8.8(L)  Total Protein 6.5 - 8.1 g/dL 7.2 7.1 -  Total Bilirubin 0.3 - 1.2 mg/dL 0.3 0.3 -  Alkaline Phos 38 - 126 U/L 119 114 -  AST 15 - 41 U/L 17 18 -  ALT 0 - 44 U/L 16 17 -   RADIOGRAPHIC STUDIES: I have personally reviewed the radiological images as listed and agreed with the findings in the report: stable lymphadenopathy with exception of the rapidly enlarging submandibular lymph node.   CT Soft Tissue Neck W Contrast  Result Date: 04/30/2020 CLINICAL DATA:  Follicular lymphoma, follow-up EXAM: CT NECK WITH CONTRAST TECHNIQUE: Multidetector CT imaging of the neck was performed using the standard  protocol following the bolus administration of intravenous contrast. CONTRAST:  147mL OMNIPAQUE IOHEXOL 300 MG/ML  SOLN COMPARISON:  01/30/2020 FINDINGS: Pharynx and larynx: No new mass or swelling. Numerous palatine tonsillar calcifications again seen. Salivary glands: Mass effect on the right submandibular gland by enlarged node. Parotid and submandibular glands are otherwise unremarkable. Thyroid: Stable subcentimeter right thyroid nodule. No follow-up recommended. Lymph nodes: Previously enlarged right submandibular node has significantly increased in size now measuring 3.9 x 3.4 cm (previously 2.6 x 2 cm). Majority of the node now demonstrates low-attenuation with soft tissue attenuation at the periphery. Mild infiltration of adjacent subcutaneous fat anteroinferiorly. Bilateral cervical and supraclavicular nodes are again identified. These are primarily nonenlarged with a rounded configuration. Overall nodes are similar in size with some nodes demonstrating slight increases or decreases. A right level 3 node on series 2, image 70 measures 7 mm (previously 12 mm). A left level 5 node on series 2, image 65 measures 8 mm (previously 7 mm). A left level 3 node on image 73 measures 11 x 10 mm (previously 11 x 9 mm). Vascular: Major neck vessels are patent. There is mixed plaque at the left greater than right ICA origins. On the left, there is potentially hemodynamically significant stenosis. Limited intracranial: Stable dural-based enhancement along the clivus on the right measuring 9 mm. This likely reflects a meningioma. Otherwise no abnormal enhancement. Visualized orbits: Unremarkable. Mastoids and visualized paranasal sinuses: Mild mucosal thickening. Mastoid air cells are clear. Skeleton: Stable appearance of degenerative changes of the cervical spine. Upper chest: Dictated separately. Other: None. IMPRESSION: Significant increase in size of right submandibular node with new central cystic change or necrosis.  Considerations include spontaneous necrosis and superimposed infection given infiltration of adjacent fat inferiorly. Remainder of cervical and supraclavicular adenopathy is mixed with stability or slight increases/decreases in size. Mixed plaque at the left ICA origin with potential hemodynamically significant stenosis. Stable subcentimeter dural based enhancement  along the right aspect of the clivus likely reflecting a meningioma. Electronically Signed   By: Macy Mis M.D.   On: 04/30/2020 09:31   CT CHEST ABDOMEN PELVIS W CONTRAST  Result Date: 04/30/2020 CLINICAL DATA:  Follicular lymphoma restaging EXAM: CT CHEST, ABDOMEN, AND PELVIS WITH CONTRAST TECHNIQUE: Multidetector CT imaging of the chest, abdomen and pelvis was performed following the standard protocol during bolus administration of intravenous contrast. CONTRAST:  162mL OMNIPAQUE IOHEXOL 300 MG/ML  SOLN COMPARISON:  PET-CT February 22, 2020 FINDINGS: CT CHEST FINDINGS Cardiovascular: Normal size heart. No pericardial effusion. Coronary artery calcifications. Aortic atherosclerosis. No thoracic aortic aneurysm. No central pulmonary embolus. Mediastinum/Nodes: Unchanged size of the index supraclavicular lymph node which measures 1.4 x 1.1 cm, when remeasured for consistency (series 2, image 7). No new or enlarging thoracic lymph nodes. Trachea esophagus are relatively unremarkable. 1.0 cm hypoattenuating right thyroid nodule (series 2, image 51). Not clinically significant; no follow-up imaging recommended (ref: J Am Coll Radiol. 2015 Feb;12(2): 143-50). Lungs/Pleura: There are scattered small bilateral pulmonary nodules which appear similar to prior for instance left lower lobe pulmonary nodule measures 6 x 5 mm previously 7 x 5 mm (series 2, image 37), another left lower lobe pulmonary nodule measures 3 mm, unchanged (series 2, image 37), a right lower lobe pulmonary nodule measures 5 mm, not definitely seen on prior imaging (series 2, image  33). The scattered nodules are superimposed on a background of tree in bud opacities in the lower lobes and dependent portions of the upper lobes. Similar mild left greater than right basilar bandlike opacities. Mild diffuse bronchial wall thickening. Musculoskeletal: Thoracic spondylosis. CT ABDOMEN PELVIS FINDINGS Hepatobiliary: No suspicious hepatic lesions. Cholecystectomy. No biliary ductal dilatation. Pancreas: Unremarkable Spleen: Normal size spleen. Adrenals/Urinary Tract: Bilateral adrenal glands unremarkable. No hydronephrosis. No enhancing renal lesions. Urinary bladder is unremarkable for degree of distension. Stomach/Bowel: Stomach is within normal limits. Terminal ileum and appendix appears normal. No evidence of bowel wall thickening, distention, or inflammatory changes. Vascular/Lymphatic: Similar mesenteric stranding and nodularity. No significant change in the prominent upper abdominal lymph nodes the indexed right gastric lymph node measures 1.1 x 0.8 cm, unchanged (series 2, image 54). No new or enlarging abdominal or pelvic lymph nodes. Reproductive: Mild prostatomegaly. Other: Calcified nodularity/lymph nodes in the right lower quadrant, likely sequela prior inflammation. Musculoskeletal: Spondylosis. Grade 1 degenerative anterolisthesis of L4 on L5. There are few scattered tiny stellate high density sclerotic lesions involving the right ilium, right acetabulum and L2 vertebral body (coronal image 79, 82 and 104), most consistent with bone islands. The age no acute osseous abnormality. IMPRESSION: 1. No significant change in size of the supraclavicular and upper abdominal lymph nodes. No new or enlarging thoracic, abdominal or pelvic lymph nodes. 2. Similar appearance of mesenteric stranding and nodularity, which is nonspecific and may represent sclerosing mesenteritis versus lymphoma, recommend continued attention on follow-up imaging. 3. Tree in bud nodular opacities in the lower lobes and  dependent portions of the upper lobes likely atypical infectious bronchiolitis. 4. Scattered bilateral pulmonary nodules the largest of which measures 6 mm, including a new 5 mm right lower lobe pulmonary nodule, favor infectious or inflammatory etiology. However, attention on short interval follow-up dedicated chest CT in 3 months could be useful to determine stability/resolution. 5. Aortic atherosclerosis (ICD10-I70.0) Electronically Signed   By: Dahlia Bailiff MD   On: 04/30/2020 09:26    ASSESSMENT & PLAN Mate J Marchena 80 y.o. male with medical history significant for Stage III  follicular lymphoma presents for a follow up visit. The patient's last visit was on 02/12/2020 at which time he establish care.  After review the labs, the records, discussion with the patient the findings are most consistent with a stage III follicular lymphoma based on the PET CT scan.  There are lymph nodes on both sides of diaphragm and therefore stage III would be the most appropriate designation.    Previously we discussed the nature of follicular lymphoma and the treatment options moving forward.  The options at this time would be observation, immunotherapy, or combination of chemotherapy and immunotherapy.  Given the lack of symptoms, cytopenias, and bulky disease at the time of presentation we decided to proceed with observation alone. Unfortunately due to rapid progression we need to consider treatment moving forward.  Today we discussed rituximab monotherapy with weekly dosing x4 doses followed by every 2 months dosing x4 as a treatment option as well as bendamustine and rituximab on days 1 and 2 of a 28-day cycle x6.  We discussed the side effects, risks and benefits, and possible outcomes of treatment.  The patient voiced his understanding of these options and wished to proceed with R-Benda.   Over the last month the patient was found to have a rapidly enlarging right submandibular lymph node.  Due to concern for  this enlarging lymph node we ordered a CT scan of the chest abdomen pelvis to assess for progression elsewhere, but that was the only lymph node that was progressing. Biopsy with ENT showed no progression into a DLBCL or other aggressive variant.  #Follicular lymphoma, Stage III --prior PET CT findings are consistent with a Stage III follicular lymphoma. Due to rapidly enlarging submandibular lymph node will proceed with treatment.  --imaging shows stable disease save for a single enlarging submandibular lymph node.  Biopsy has not shown transformation into a more aggressive lymphoma.  --after discussion with the patient, he is agreeable to starting Bendamustine/Rituximab as first line treatment.  --can consider radiation therapy to single lymph node if systemic therapy fails to decrease size.  --RTC in 3 months with repeat CT Scan.    #Supportive Care --chemotherapy education to be scheduled  --zofran 8mg  q8H PRN and compazine 10mg  PO q6H for nausea --acyclovir 400mg  PO BID for VCZ prophylaxis -- allopurinol 300mg  PO daily for TLS prophylaxis -- no port required for this regimen.  -- no pain medication required at this time.    No orders of the defined types were placed in this encounter.  All questions were answered. The patient knows to call the clinic with any problems, questions or concerns.  A total of more than 30 minutes were spent on this encounter and over half of that time was spent on counseling and coordination of care as outlined above.   Ledell Peoples, MD Department of Hematology/Oncology [place] [facility] Phone: [phone] Pager: [phone] Email: [email or web address]  05/29/2020 1:51 PM

## 2020-06-05 ENCOUNTER — Inpatient Hospital Stay: Payer: Medicare Other

## 2020-06-05 ENCOUNTER — Other Ambulatory Visit: Payer: Self-pay

## 2020-06-06 ENCOUNTER — Telehealth: Payer: Self-pay | Admitting: Hematology and Oncology

## 2020-06-06 ENCOUNTER — Telehealth: Payer: Self-pay | Admitting: *Deleted

## 2020-06-06 NOTE — Progress Notes (Signed)
Pharmacist Chemotherapy Monitoring - Initial Assessment    Anticipated start date: 06/12/20   Regimen:  . Are orders appropriate based on the patient's diagnosis, regimen, and cycle? Yes . Does the plan date match the patient's scheduled date? Yes . Is the sequencing of drugs appropriate? Yes . Are the premedications appropriate for the patient's regimen? Yes . Prior Authorization for treatment is: Approved o If applicable, is the correct biosimilar selected based on the patient's insurance? yes  Organ Function and Labs: [name] Are dose adjustments needed based on the patient's renal function, hepatic function, or hematologic function? No . Are appropriate labs ordered prior to the start of patient's treatment? Yes . Other organ system assessment, if indicated: N/A . The following baseline labs, if indicated, have been ordered: rituximab: baseline Hepatitis B labs  Dose Assessment: . Are the drug doses appropriate? Yes . Are the following correct: o Drug concentrations Yes o IV fluid compatible with drug Yes o Administration routes Yes o Timing of therapy Yes . If applicable, does the patient have documented access for treatment and/or plans for port-a-cath placement? no . If applicable, have lifetime cumulative doses been properly documented and assessed? not applicable Lifetime Dose Tracking  No doses have been documented on this patient for the following tracked chemicals: Doxorubicin, Epirubicin, Idarubicin, Daunorubicin, Mitoxantrone, Bleomycin, Oxaliplatin, Carboplatin, Liposomal Doxorubicin  o   Toxicity Monitoring/Prevention: . The patient has the following take home antiemetics prescribed: Ondansetron, Prochlorperazine and Dexamethasone . The patient has the following take home medications prescribed: Tumor Lysis Syndrome prophylaxis and VZV prophylaxis . Medication allergies and previous infusion related reactions, if applicable, have been reviewed and addressed. Yes . The  patient's current medication list has been assessed for drug-drug interactions with their chemotherapy regimen. Dilantin + Allopurinol significant drug-drug interactions were identified on review.  I have informed Dr. Lorenso Courier.  Pt may need close monitoring of Dilantin levels while on Allopurinol.  Order Review: . Are the treatment plan orders signed? No . Is the patient scheduled to see a provider prior to their treatment? Yes  I verify that I have reviewed each item in the above checklist and answered each question accordingly.  Tora Kindred, RPH, 06/06/2020  2:17 PM

## 2020-06-06 NOTE — Telephone Encounter (Signed)
Left message with follow-up appointments per 3/16 los. Gave option to call back to reschedule if needed. 

## 2020-06-06 NOTE — Telephone Encounter (Signed)
Received call from patient to clarify upcoming appts. Reviewed appts with him. He voiced understanding

## 2020-06-11 ENCOUNTER — Encounter: Payer: Self-pay | Admitting: Hematology and Oncology

## 2020-06-11 NOTE — Progress Notes (Signed)
Pt returned my call so I informed him of the [employer], went over what it covers and gave him the income requirement.  Pt stated he exceeds that amount so he doesn't qualify for the grant at this time.  I will request the registration staff give him my card for any questions or concerns he may have in the future.

## 2020-06-11 NOTE — Progress Notes (Signed)
Called pt to introduce myself as his Financial Resource Specialist and to discuss the [employer] grant.  Unfortunately there aren't any foundations offering copay assistance for his Dx and the type of ins he has.  I left a msg requesting he return my call if he's interested in applying for the grant. °

## 2020-06-11 NOTE — Progress Notes (Signed)
[place] [phone]   [phone]  PROGRESS NOTE  Patient Care Team: Shon Baton, MD as PCP - General (Internal Medicine)  Hematological/Oncological History #Follicular lymphoma Stage III 1) 01/30/2020: CT neck shows numerous enlarged lymph nodes, especially in the right submandibular space - which is the palpable abnormality. 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 Lorenso Courier  4) 02/22/2020: PET CT scan showed Deauville 4 and Deauville 5 adenopathy bilaterally in the neck. There is also a Deauville 4 left supraclavicular lymph node and a Deauville 4 right gastric lymph node 5) 04/24/2020: presents with rapid enlargement of his right submandibular lymph node.  6) 05/20/2020: partial excision of right submandibular lymph node with attempted fluid drainage. Pathology consistent with follicular lymphoma, no clear evidence of transformation.  7) 06/12/2020: Cycle 1 Day 1 of R-Benda.   Interval History:  Brian Castillo 80 y.o. 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 The patient's last visit was on 05/29/2020. In the interim since the last visit he has completed chemotherapy education and is prepared to start treatment.   On exam today Brian Castillo notes he has seen some increase in the size of the lymph node in his neck.  He notes that his little uncomfortable still but otherwise stable.  He notes he had a light breakfast and is otherwise been well in the interim since her last visit.  He is willing and able to proceed with treatment at this time.  He has not been having any issues with fevers, chills, sweats, nausea, vomiting or diarrhea.  None of his other lymph nodes appear enlarged at this time.  A full 10 point ROS is listed below.  MEDICAL HISTORY:  Past Medical History:  Diagnosis Date  . Allergy    mild  .  Arthritis   . Cataract    forming  . Colon polyps   . GERD (gastroesophageal reflux disease)    15 -20 years ago -none currently  . Glaucoma   . Hyperlipidemia   . Hypertension   . 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 Seizures ([place])    last seizure was in 1988    SURGICAL HISTORY: Past Surgical History:  Procedure Laterality Date  . CARPOMETACARPEL SUSPENSION PLASTY Right 08/24/2014   Procedure: RIGHT CMC ARTHOPLASTY WITH DOUBLE TENDON TRANSFER AND REPAIR  RECONSTRUCTION ;  Surgeon: Roseanne Kaufman, MD;  Location: [facility];  Service: Orthopedics;  Laterality: Right;  . CHOLECYSTECTOMY    . COLONOSCOPY    . KNEE ARTHROSCOPY  2003   bilateral  . POLYPECTOMY      SOCIAL HISTORY: Social History   Socioeconomic History  . Marital status: Married    Spouse name: Not on file  . Number of children: Not on file  . Years of education: Not on file  . Highest education level: Not on file  Occupational History  . Not on file  Tobacco Use  . Smoking status: Former Research scientist (life sciences)  . Smokeless tobacco: Never Used  . Tobacco comment: quit 1984  Substance and Sexual Activity  . Alcohol use: Yes    Alcohol/week: 14.0 standard drinks    Types: 14 Glasses of wine per week  . Drug use: No  . Sexual activity: Not on file  Other Topics Concern  . Not on file  Social History Narrative  .  Not on file   Social Determinants of Health   Financial Resource Strain: Not on file  Food Insecurity: Not on file  Transportation Needs: Not on file  Physical Activity: Not on file  Stress: Not on file  Social Connections: Not on file  Intimate Partner Violence: Not on file    FAMILY HISTORY: Family History  Problem Relation Age of Onset  . Colon cancer Neg Hx   . Colon polyps Neg Hx     ALLERGIES:  is allergic to brimonidine tartrate-timolol, lisinopril, and netarsudil dimesylate.  MEDICATIONS:  Current Outpatient Medications  Medication Sig Dispense Refill  .  acyclovir (ZOVIRAX) 400 MG tablet Take 1 tablet (400 mg total) by mouth 2 (two) times daily. 60 tablet 5  . allopurinol (ZYLOPRIM) 300 MG tablet Take 1 tablet (300 mg total) by mouth daily. 90 tablet 1  . amLODipine (NORVASC) 5 MG tablet Take 5 mg by mouth Daily.     [name] aspirin 81 MG tablet Take 81 mg by mouth daily.    . Calcium Carb-Cholecalciferol (CALCIUM 600 + D PO) Take 1 tablet by mouth daily.    . cholecalciferol (VITAMIN D) 1000 units tablet Take 1,000 Units by mouth daily.    [name] dexamethasone (DECADRON) 4 MG tablet Take 2 tablets (8 mg total) by mouth as directed. Take 2 pills (8mg ) twice daily on Days 3 and 4 of treatment 8 tablet 5  . Latanoprostene Bunod (VYZULTA) 0.024 % SOLN Place 1 drop into both eyes at bedtime.    . metoprolol succinate (TOPROL-XL) 25 MG 24 hr tablet Take 25 mg by mouth Daily.     . ondansetron (ZOFRAN) 8 MG tablet Take 1 tablet (8 mg total) by mouth every 8 (eight) hours as needed for nausea or vomiting. 30 tablet 0  . phenytoin (DILANTIN) 100 MG ER capsule Take 200 mg by mouth 2 (two) times daily.    . pravastatin (PRAVACHOL) 80 MG tablet Take 80 mg by mouth Daily.     . prochlorperazine (COMPAZINE) 10 MG tablet Take 1 tablet (10 mg total) by mouth every 6 (six) hours as needed for nausea or vomiting. 30 tablet 0  . timolol (BETIMOL) 0.5 % ophthalmic solution Place 1 drop into both eyes 2 (two) times daily.     No current facility-administered medications for this visit.   Facility-Administered Medications Ordered in Other Visits  Medication Dose Route Frequency Provider Last Rate Last Admin  . bendamustine (BENDEKA) 175 mg in sodium chloride 0.9 % 50 mL (3.0702 mg/mL) chemo infusion  90 mg/m2 (Treatment Plan Recorded) Intravenous Once Orson Slick, MD      . riTUXimab-pvvr (RUXIENCE) 700 mg in sodium chloride 0.9 % 250 mL (2.1875 mg/mL) infusion  375 mg/m2 (Treatment Plan Recorded) Intravenous Once Orson Slick, MD        REVIEW OF SYSTEMS:    Constitutional: ( - ) fevers, ( - )  chills , ( - ) night sweats Eyes: ( - ) blurriness of vision, ( - ) double vision, ( - ) watery eyes Ears, nose, mouth, throat, and face: ( - ) mucositis, ( - ) sore throat Respiratory: ( - ) cough, ( - ) dyspnea, ( - ) wheezes Cardiovascular: ( - ) palpitation, ( - ) chest discomfort, ( - ) lower extremity swelling Gastrointestinal:  ( - ) nausea, ( - ) heartburn, ( - ) change in bowel habits Skin: ( - ) abnormal skin rashes Lymphatics: ( - ) new  lymphadenopathy, ( - ) easy bruising Neurological: ( - ) numbness, ( - ) tingling, ( - ) new weaknesses Behavioral/Psych: ( - ) mood change, ( - ) new changes  All other systems were reviewed with the patient and are negative.  PHYSICAL EXAMINATION: ECOG PERFORMANCE STATUS: 1 - Symptomatic but completely ambulatory  There were no vitals filed for this visit. There were no vitals filed for this visit.  GENERAL: well appearing elderly Caucasian male in NAD  SKIN: skin color, texture, turgor are normal, no rashes or significant lesions EYES: conjunctiva are pink and non-injected, sclera clear NECK: supple, non-tender LYMPH:  Single large right sided cervical lymph node, continuing to enlarge. Otherwise no marked palpable lymphadenopathy in the cervical, axillary or supraclavicular lymph nodes.  LUNGS: clear to auscultation and percussion with normal breathing effort HEART: regular rate & rhythm and no murmurs and no lower extremity edema Musculoskeletal: no cyanosis of digits and no clubbing  PSYCH: alert & oriented x 3, fluent speech NEURO: no focal motor/sensory deficits  LABORATORY DATA:  I have reviewed the data as listed CBC Latest Ref Rng & Units 06/12/2020 05/29/2020 04/24/2020  WBC 4.0 - 10.5 K/uL 6.2 6.5 9.5  Hemoglobin 13.0 - 17.0 g/dL 13.3 13.4 13.5  Hematocrit 39.0 - 52.0 % 39.0 38.3(L) 39.8  Platelets 150 - 400 K/uL 156 148(L) 161    CMP Latest Ref Rng & Units 06/12/2020 05/29/2020 04/24/2020   Glucose 70 - 99 mg/dL 100(H) 106(H) 118(H)  BUN 8 - 23 mg/dL 18 21 21   Creatinine 0.61 - 1.24 mg/dL 0.78 0.79 0.81  Sodium 135 - 145 mmol/L 142 136 139  Potassium 3.5 - 5.1 mmol/L 4.1 4.8 4.5  Chloride 98 - 111 mmol/L 108 104 106  CO2 22 - 32 mmol/L 24 26 26   Calcium 8.9 - 10.3 mg/dL 8.6(L) 8.8(L) 8.9  Total Protein 6.5 - 8.1 g/dL 6.7 7.0 7.2  Total Bilirubin 0.3 - 1.2 mg/dL 0.3 0.4 0.3  Alkaline Phos 38 - 126 U/L 114 121 119  AST 15 - 41 U/L 14(L) 17 17  ALT 0 - 44 U/L 13 16 16    RADIOGRAPHIC STUDIES: I have personally reviewed the radiological images as listed and agreed with the findings in the report: stable lymphadenopathy with exception of the rapidly enlarging submandibular lymph node.   No results found.  ASSESSMENT & PLAN Brian Castillo 80 y.o. 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 The patient's last visit was on 02/12/2020 at which time he establish care.  After review the labs, the records, discussion with the patient the findings are most consistent with a stage III follicular lymphoma based on the PET CT scan.  There are lymph nodes on both sides of diaphragm and therefore stage III would be the most appropriate designation.    Previously we discussed the nature of follicular lymphoma and the treatment options moving forward.  The options at this time would be observation, immunotherapy, or combination of chemotherapy and immunotherapy.  Given the lack of symptoms, cytopenias, and bulky disease at the time of presentation we decided to proceed with observation alone. Unfortunately due to rapid progression we need to consider treatment moving forward.  Previously we discussed rituximab monotherapy with weekly dosing x4 doses followed by every 2 months dosing x4 as a treatment option as well as bendamustine and rituximab on days 1 and 2 of a 28-day cycle x6.  We discussed the side effects, risks  and benefits, and  possible outcomes of treatment.  The patient voiced his understanding of these options and wished to proceed with R-Benda.   Over the last month the patient was found to have a rapidly enlarging right submandibular lymph node.  Due to concern for this enlarging lymph node we ordered a CT scan of the chest abdomen pelvis to assess for progression elsewhere, but that was the only lymph node that was progressing. Biopsy with ENT showed no progression into a DLBCL or other aggressive variant.  #Follicular lymphoma, Stage III --prior PET CT findings are consistent with a Stage III follicular lymphoma. Due to rapidly enlarging submandibular lymph node will proceed with treatment.  --imaging shows stable disease save for a single enlarging submandibular lymph node.  Biopsy has not shown transformation into a more aggressive lymphoma.  --after discussion with the patient, he is agreeable to starting Bendamustine/Rituximab as first line treatment.  --today is Cycle 1 Day 1 of R-Benda chemotherapy --RTC in 2 weeks time with interval weekly lab visits.  #Medication Interaction --Allopurinol increases the levels of Dilantin. --We will work with the patient's primary care provider Dr. Shon Baton in order to come up with a plan in order to monitor his Dilantin levels and adjust accordingly.  #Supportive Care --chemotherapy education completed --zofran 8mg  q8H PRN and compazine 10mg  PO q6H for nausea --acyclovir 400mg  PO BID for VCZ prophylaxis -- allopurinol 300mg  PO daily for TLS prophylaxis -- no port required for this regimen.  -- no pain medication required at this time.    No orders of the defined types were placed in this encounter.  All questions were answered. The patient knows to call the clinic with any problems, questions or concerns.  A total of more than 30 minutes were spent on this encounter and over half of that time was spent on counseling and coordination of care as outlined above.    Ledell Peoples, MD Department of Hematology/Oncology [place] [facility] Phone: [phone] Pager: [phone] Email: [email or web address]  06/12/2020 9:33 AM

## 2020-06-12 ENCOUNTER — Inpatient Hospital Stay: Payer: Medicare Other

## 2020-06-12 ENCOUNTER — Encounter: Payer: Self-pay | Admitting: Hematology and Oncology

## 2020-06-12 ENCOUNTER — Other Ambulatory Visit: Payer: Medicare Other

## 2020-06-12 ENCOUNTER — Other Ambulatory Visit: Payer: Self-pay

## 2020-06-12 ENCOUNTER — Inpatient Hospital Stay: Payer: Medicare Other | Admitting: Hematology and Oncology

## 2020-06-12 ENCOUNTER — Other Ambulatory Visit: Payer: Self-pay | Admitting: Hematology and Oncology

## 2020-06-12 ENCOUNTER — Other Ambulatory Visit: Payer: Self-pay | Admitting: *Deleted

## 2020-06-12 VITALS — BP 120/52 | HR 58 | Temp 98.1°F | Resp 16 | Wt 178.5 lb

## 2020-06-12 DIAGNOSIS — Z5111 Encounter for antineoplastic chemotherapy: Secondary | ICD-10-CM | POA: Diagnosis not present

## 2020-06-12 DIAGNOSIS — Z5112 Encounter for antineoplastic immunotherapy: Secondary | ICD-10-CM

## 2020-06-12 DIAGNOSIS — C8511 Unspecified B-cell lymphoma, lymph nodes of head, face, and neck: Secondary | ICD-10-CM | POA: Diagnosis not present

## 2020-06-12 DIAGNOSIS — C8251 Diffuse follicle center lymphoma, lymph nodes of head, face, and neck: Secondary | ICD-10-CM

## 2020-06-12 LAB — CBC WITH DIFFERENTIAL (CANCER CENTER ONLY)
Abs Immature Granulocytes: 0.01 10*3/uL (ref 0.00–0.07)
Basophils Absolute: 0 10*3/uL (ref 0.0–0.1)
Basophils Relative: 1 %
Eosinophils Absolute: 0.3 10*3/uL (ref 0.0–0.5)
Eosinophils Relative: 6 %
HCT: 39 % (ref 39.0–52.0)
Hemoglobin: 13.3 g/dL (ref 13.0–17.0)
Immature Granulocytes: 0 %
Lymphocytes Relative: 24 %
Lymphs Abs: 1.5 10*3/uL (ref 0.7–4.0)
MCH: 31.7 pg (ref 26.0–34.0)
MCHC: 34.1 g/dL (ref 30.0–36.0)
MCV: 93.1 fL (ref 80.0–100.0)
Monocytes Absolute: 0.6 10*3/uL (ref 0.1–1.0)
Monocytes Relative: 9 %
Neutro Abs: 3.8 10*3/uL (ref 1.7–7.7)
Neutrophils Relative %: 60 %
Platelet Count: 156 10*3/uL (ref 150–400)
RBC: 4.19 MIL/uL — ABNORMAL LOW (ref 4.22–5.81)
RDW: 12.5 % (ref 11.5–15.5)
WBC Count: 6.2 10*3/uL (ref 4.0–10.5)
nRBC: 0 % (ref 0.0–0.2)

## 2020-06-12 LAB — CMP (CANCER CENTER ONLY)
ALT: 13 U/L (ref 0–44)
AST: 14 U/L — ABNORMAL LOW (ref 15–41)
Albumin: 3.9 g/dL (ref 3.5–5.0)
Alkaline Phosphatase: 114 U/L (ref 38–126)
Anion gap: 10 (ref 5–15)
BUN: 18 mg/dL (ref 8–23)
CO2: 24 mmol/L (ref 22–32)
Calcium: 8.6 mg/dL — ABNORMAL LOW (ref 8.9–10.3)
Chloride: 108 mmol/L (ref 98–111)
Creatinine: 0.78 mg/dL (ref 0.61–1.24)
GFR, Estimated: >60 mL/min (ref >60)
Glucose, Bld: 100 mg/dL — ABNORMAL HIGH (ref 70–99)
Potassium: 4.1 mmol/L (ref 3.5–5.1)
Sodium: 142 mmol/L (ref 135–145)
Total Bilirubin: 0.3 mg/dL (ref 0.3–1.2)
Total Protein: 6.7 g/dL (ref 6.5–8.1)

## 2020-06-12 LAB — LACTATE DEHYDROGENASE: LDH: 176 U/L (ref 98–192)

## 2020-06-12 LAB — HEPATITIS B CORE ANTIBODY, TOTAL: Hep B Core Total Ab: NONREACTIVE

## 2020-06-12 LAB — HEPATITIS B SURFACE ANTIGEN: Hepatitis B Surface Ag: NONREACTIVE

## 2020-06-12 LAB — URIC ACID: Uric Acid, Serum: 5.7 mg/dL (ref 3.7–8.6)

## 2020-06-12 MED ORDER — SODIUM CHLORIDE 0.9 % IV SOLN
Freq: Once | INTRAVENOUS | Status: AC
Start: 2020-06-12 — End: 2020-06-12
  Filled 2020-06-12: qty 250

## 2020-06-12 MED ORDER — DIPHENHYDRAMINE HCL 25 MG PO CAPS
ORAL_CAPSULE | ORAL | Status: AC
  Filled 2020-06-12: qty 2

## 2020-06-12 MED ORDER — ACETAMINOPHEN 325 MG PO TABS
ORAL_TABLET | ORAL | Status: AC
  Filled 2020-06-12: qty 2

## 2020-06-12 MED ORDER — SODIUM CHLORIDE 0.9 % IV SOLN
10.0000 mg | Freq: Once | INTRAVENOUS | Status: AC
  Administered 2020-06-12: 10 mg via INTRAVENOUS
  Filled 2020-06-12: qty 10

## 2020-06-12 MED ORDER — PALONOSETRON HCL INJECTION 0.25 MG/5ML
0.2500 mg | Freq: Once | INTRAVENOUS | Status: AC
  Administered 2020-06-12: 0.25 mg via INTRAVENOUS

## 2020-06-12 MED ORDER — SODIUM CHLORIDE 0.9 % IV SOLN
90.0000 mg/m2 | Freq: Once | INTRAVENOUS | Status: AC
  Administered 2020-06-12: 175 mg via INTRAVENOUS
  Filled 2020-06-12: qty 7

## 2020-06-12 MED ORDER — DIPHENHYDRAMINE HCL 25 MG PO CAPS
50.0000 mg | ORAL_CAPSULE | Freq: Once | ORAL | Status: AC
  Administered 2020-06-12: 50 mg via ORAL

## 2020-06-12 MED ORDER — SODIUM CHLORIDE 0.9 % IV SOLN
375.0000 mg/m2 | Freq: Once | INTRAVENOUS | Status: AC
  Administered 2020-06-12: 700 mg via INTRAVENOUS
  Filled 2020-06-12: qty 50

## 2020-06-12 MED ORDER — ACETAMINOPHEN 325 MG PO TABS
650.0000 mg | ORAL_TABLET | Freq: Once | ORAL | Status: AC
  Administered 2020-06-12: 650 mg via ORAL

## 2020-06-12 MED ORDER — PALONOSETRON HCL INJECTION 0.25 MG/5ML
INTRAVENOUS | Status: AC
  Filled 2020-06-12: qty 5

## 2020-06-12 NOTE — Patient Instructions (Signed)
Rituximab Injection What is this medicine? RITUXIMAB (ri TUX i mab) is a monoclonal antibody. It is used to treat certain types of cancer like non-Hodgkin lymphoma and chronic lymphocytic leukemia. It is also used to treat rheumatoid arthritis, granulomatosis with polyangiitis, microscopic polyangiitis, and pemphigus vulgaris. This medicine may be used for other purposes; ask your health care provider or pharmacist if you have questions. COMMON BRAND NAME(S): RIABNI, Rituxan, RUXIENCE What should I tell my health care provider before I take this medicine? They need to know if you have any of these conditions:  chest pain  heart disease  infection especially a viral infection such as chickenpox, cold sores, hepatitis B, or herpes  immune system problems  irregular heartbeat or rhythm  kidney disease  low blood counts (white cells, platelets, or red cells)  lung disease  recent or upcoming vaccine  an unusual or allergic reaction to rituximab, other medicines, foods, dyes, or preservatives  pregnant or trying to get pregnant  breast-feeding How should I use this medicine? This medicine is injected into a vein. It is given by a health care provider in a hospital or clinic setting. A special MedGuide will be given to you before each treatment. Be sure to read this information carefully each time. Talk to your health care provider about the use of this medicine in children. While this drug may be prescribed for children as young as 2 years for selected conditions, precautions do apply. Overdosage: If you think you have taken too much of this medicine contact a poison control center or emergency room at once. NOTE: This medicine is only for you. Do not share this medicine with others. What if I miss a dose? Keep appointments for follow-up doses. It is important not to miss your dose. Call your health care provider if you are unable to keep an appointment. What may interact with this  medicine? Do not take this medicine with any of the following medicines:  live vaccines This medicine may also interact with the following medicines:  cisplatin This list may not describe all possible interactions. Give your health care provider a list of all the medicines, herbs, non-prescription drugs, or dietary supplements you use. Also tell them if you smoke, drink alcohol, or use illegal drugs. Some items may interact with your medicine. What should I watch for while using this medicine? Your condition will be monitored carefully while you are receiving this medicine. You may need blood work done while you are taking this medicine. This medicine can cause serious infusion reactions. To reduce the risk your health care provider may give you other medicines to take before receiving this one. Be sure to follow the directions from your health care provider. This medicine may increase your risk of getting an infection. Call your health care provider for advice if you get a fever, chills, sore throat, or other symptoms of a cold or flu. Do not treat yourself. Try to avoid being around people who are sick. Call your health care provider if you are around anyone with measles, chickenpox, or if you develop sores or blisters that do not heal properly. Avoid taking medicines that contain aspirin, acetaminophen, ibuprofen, naproxen, or ketoprofen unless instructed by your health care provider. These medicines may hide a fever. This medicine may cause serious skin reactions. They can happen weeks to months after starting the medicine. Contact your health care provider right away if you notice fevers or flu-like symptoms with a rash. The rash may be red   or purple and then turn into blisters or peeling of the skin. Or, you might notice a red rash with swelling of the face, lips or lymph nodes in your neck or under your arms. In some patients, this medicine may cause a serious brain infection that may cause  death. If you have any problems seeing, thinking, speaking, walking, or standing, tell your healthcare professional right away. If you cannot reach your healthcare professional, urgently seek other source of medical care. Do not become pregnant while taking this medicine or for at least 12 months after stopping it. Women should inform their health care provider if they wish to become pregnant or think they might be pregnant. There is potential for serious harm to an unborn child. Talk to your health care provider for more information. Women should use a reliable form of birth control while taking this medicine and for 12 months after stopping it. Do not breast-feed while taking this medicine or for at least 6 months after stopping it. What side effects may I notice from receiving this medicine? Side effects that you should report to your health care provider as soon as possible:  allergic reactions (skin rash, itching or hives; swelling of the face, lips, or tongue)  diarrhea  edema (sudden weight gain; swelling of the ankles, feet, hands or other unusual swelling; trouble breathing)  fast, irregular heartbeat  heart attack (trouble breathing; pain or tightness in the chest, neck, back or arms; unusually weak or tired)  infection (fever, chills, cough, sore throat, pain or trouble passing urine)  kidney injury (trouble passing urine or change in the amount of urine)  liver injury (dark yellow or brown urine; general ill feeling or flu-like symptoms; loss of appetite, right upper belly pain; unusually weak or tired, yellowing of the eyes or skin)  low blood pressure (dizziness; feeling faint or lightheaded, falls; unusually weak or tired)  low red blood cell counts (trouble breathing; feeling faint; lightheaded, falls; unusually weak or tired)  mouth sores  redness, blistering, peeling, or loosening of the skin, including inside the mouth  stomach pain  unusual bruising or  bleeding  wheezing (trouble breathing with loud or whistling sounds)  vomiting Side effects that usually do not require medical attention (report to your health care provider if they continue or are bothersome):  headache  joint pain  muscle cramps, pain  nausea This list may not describe all possible side effects. Call your doctor for medical advice about side effects. You may report side effects to FDA at 1-800-FDA-1088. Where should I keep my medicine? This medicine is given in a hospital or clinic. It will not be stored at home. NOTE: This sheet is a summary. It may not cover all possible information. If you have questions about this medicine, talk to your doctor, pharmacist, or health care provider.  2021 [employer] Standard (2019-12-14 21:35:50) Bendamustine Injection What is this medicine? BENDAMUSTINE (BEN da MUS teen) is a chemotherapy drug. It is used to treat chronic lymphocytic leukemia and non-Hodgkin lymphoma. This medicine may be used for other purposes; ask your health care provider or pharmacist if you have questions. COMMON BRAND NAME(S): Kristine Royal, Treanda What should I tell my health care provider before I take this medicine? They need to know if you have any of these conditions:  infection (especially a virus infection such as chickenpox, cold sores, or herpes)  kidney disease  liver disease  an unusual or allergic reaction to bendamustine, mannitol, other medicines, foods, dyes,  or preservatives  pregnant or trying to get pregnant  breast-feeding How should I use this medicine? This medicine is for infusion into a vein. It is given by a health care professional in a hospital or clinic setting. Talk to your pediatrician regarding the use of this medicine in children. Special care may be needed. Overdosage: If you think you have taken too much of this medicine contact a poison control center or emergency room at once. NOTE: This medicine is  only for you. Do not share this medicine with others. What if I miss a dose? It is important not to miss your dose. Call your doctor or health care professional if you are unable to keep an appointment. What may interact with this medicine? Do not take this medicine with any of the following medications:  clozapine This medicine may also interact with the following medications:  atazanavir  cimetidine  ciprofloxacin  enoxacin  fluvoxamine  medicines for seizures like carbamazepine and phenobarbital  mexiletine  rifampin  tacrine  thiabendazole  zileuton This list may not describe all possible interactions. Give your health care provider a list of all the medicines, herbs, non-prescription drugs, or dietary supplements you use. Also tell them if you smoke, drink alcohol, or use illegal drugs. Some items may interact with your medicine. What should I watch for while using this medicine? This drug may make you feel generally unwell. This is not uncommon, as chemotherapy can affect healthy cells as well as cancer cells. Report any side effects. Continue your course of treatment even though you feel ill unless your doctor tells you to stop. You may need blood work done while you are taking this medicine. Call your doctor or healthcare provider for advice if you get a fever, chills or sore throat, or other symptoms of a cold or flu. Do not treat yourself. This drug decreases your body's ability to fight infections. Try to avoid being around people who are sick. This medicine may cause serious skin reactions. They can happen weeks to months after starting the medicine. Contact your healthcare provider right away if you notice fevers or flu-like symptoms with a rash. The rash may be red or purple and then turn into blisters or peeling of the skin. Or, you might notice a red rash with swelling of the face, lips or lymph nodes in your neck or under your arms. In some patients, this medicine  may cause a serious brain infection that may cause death. If you have any problems seeing, thinking, speaking, walking, or standing, tell your health care provider right away. If you cannot reach your health care provider, urgently seek other source of medical care. This medicine may increase your risk to bruise or bleed. Call your doctor or healthcare provider if you notice any unusual bleeding. Talk to your doctor about your risk of cancer. You may be more at risk for certain types of cancers if you take this medicine. This medicine may increase your risk of skin cancer. Check your skin for changes to moles or for new growths while taking this medicine. Call your health care provider if you notice any of these skin changes. Do not become pregnant while taking this medicine or for at least 6 months after stopping it. Women should inform their doctor if they wish to become pregnant or think they might be pregnant. Men should not father a child while taking this medicine and for at least 3 months after stopping it. There is a potential for  serious side effects to an unborn child. Talk to your healthcare provider or pharmacist for more information. Do not breast-feed an infant while taking this medicine or for at least 1 week after stopping it. This medicine may make it more difficult to father a child. You should talk with your doctor or healthcare provider if you are concerned about your fertility. What side effects may I notice from receiving this medicine? Side effects that you should report to your doctor or health care professional as soon as possible:  allergic reactions like skin rash, itching or hives, swelling of the face, lips, or tongue  low blood counts - this medicine may decrease the number of white blood cells, red blood cells and platelets. You may be at increased risk for infections and bleeding.  rash, fever, and swollen lymph nodes  redness, blistering, peeling, or loosening of the  skin, including inside the mouth  signs of infection like fever or chills, cough, sore throat, pain or difficulty passing urine  signs of decreased platelets or bleeding like bruising, pinpoint red spots on the skin, black, tarry stools, blood in the urine  signs of decreased red blood cells like being unusually weak or tired, fainting spells, lightheadedness  signs and symptoms of kidney injury like trouble passing urine or change in the amount of urine  signs and symptoms of liver injury like dark yellow or brown urine; general ill feeling or flu-like symptoms; light-colored stools; loss of appetite; nausea; right upper belly pain; unusually weak or tired; yellowing of the eyes or skin Side effects that usually do not require medical attention (report to your doctor or health care professional if they continue or are bothersome):  constipation  decreased appetite  diarrhea  headache  mouth sores  nausea, vomiting  tiredness This list may not describe all possible side effects. Call your doctor for medical advice about side effects. You may report side effects to FDA at 1-800-FDA-1088. Where should I keep my medicine? This drug is given in a hospital or clinic and will not be stored at home. NOTE: This sheet is a summary. It may not cover all possible information. If you have questions about this medicine, talk to your doctor, pharmacist, or health care provider.  2021 [employer]/Gold Standard (2019-08-28 12:11:43)

## 2020-06-12 NOTE — Progress Notes (Signed)
Ok to treat with HR of 51 per Narda Rutherford, MD

## 2020-06-13 ENCOUNTER — Inpatient Hospital Stay: Payer: Medicare Other

## 2020-06-13 VITALS — BP 124/46 | HR 65 | Temp 98.8°F | Resp 16

## 2020-06-13 DIAGNOSIS — C8511 Unspecified B-cell lymphoma, lymph nodes of head, face, and neck: Secondary | ICD-10-CM | POA: Diagnosis not present

## 2020-06-13 DIAGNOSIS — C8251 Diffuse follicle center lymphoma, lymph nodes of head, face, and neck: Secondary | ICD-10-CM

## 2020-06-13 MED ORDER — SODIUM CHLORIDE 0.9 % IV SOLN
10.0000 mg | Freq: Once | INTRAVENOUS | Status: AC
  Administered 2020-06-13: 10 mg via INTRAVENOUS
  Filled 2020-06-13: qty 10

## 2020-06-13 MED ORDER — BENDAMUSTINE HCL CHEMO INJECTION 100 MG/4ML
90.0000 mg/m2 | Freq: Once | INTRAVENOUS | Status: AC
  Administered 2020-06-13: 175 mg via INTRAVENOUS
  Filled 2020-06-13: qty 7

## 2020-06-13 MED ORDER — SODIUM CHLORIDE 0.9 % IV SOLN
Freq: Once | INTRAVENOUS | Status: AC
  Filled 2020-06-13: qty 250

## 2020-06-13 NOTE — Patient Instructions (Signed)
Bendamustine Injection What is this medicine? BENDAMUSTINE (BEN da MUS teen) is a chemotherapy drug. It is used to treat chronic lymphocytic leukemia and non-Hodgkin lymphoma. This medicine may be used for other purposes; ask your health care provider or pharmacist if you have questions. COMMON BRAND NAME(S): Kristine Royal, Treanda What should I tell my health care provider before I take this medicine? They need to know if you have any of these conditions:  infection (especially a virus infection such as chickenpox, cold sores, or herpes)  kidney disease  liver disease  an unusual or allergic reaction to bendamustine, mannitol, other medicines, foods, dyes, or preservatives  pregnant or trying to get pregnant  breast-feeding How should I use this medicine? This medicine is for infusion into a vein. It is given by a health care professional in a hospital or clinic setting. Talk to your pediatrician regarding the use of this medicine in children. Special care may be needed. Overdosage: If you think you have taken too much of this medicine contact a poison control center or emergency room at once. NOTE: This medicine is only for you. Do not share this medicine with others. What if I miss a dose? It is important not to miss your dose. Call your doctor or health care professional if you are unable to keep an appointment. What may interact with this medicine? Do not take this medicine with any of the following medications:  clozapine This medicine may also interact with the following medications:  atazanavir  cimetidine  ciprofloxacin  enoxacin  fluvoxamine  medicines for seizures like carbamazepine and phenobarbital  mexiletine  rifampin  tacrine  thiabendazole  zileuton This list may not describe all possible interactions. Give your health care provider a list of all the medicines, herbs, non-prescription drugs, or dietary supplements you use. Also tell them if  you smoke, drink alcohol, or use illegal drugs. Some items may interact with your medicine. What should I watch for while using this medicine? This drug may make you feel generally unwell. This is not uncommon, as chemotherapy can affect healthy cells as well as cancer cells. Report any side effects. Continue your course of treatment even though you feel ill unless your doctor tells you to stop. You may need blood work done while you are taking this medicine. Call your doctor or healthcare provider for advice if you get a fever, chills or sore throat, or other symptoms of a cold or flu. Do not treat yourself. This drug decreases your body's ability to fight infections. Try to avoid being around people who are sick. This medicine may cause serious skin reactions. They can happen weeks to months after starting the medicine. Contact your healthcare provider right away if you notice fevers or flu-like symptoms with a rash. The rash may be red or purple and then turn into blisters or peeling of the skin. Or, you might notice a red rash with swelling of the face, lips or lymph nodes in your neck or under your arms. In some patients, this medicine may cause a serious brain infection that may cause death. If you have any problems seeing, thinking, speaking, walking, or standing, tell your health care provider right away. If you cannot reach your health care provider, urgently seek other source of medical care. This medicine may increase your risk to bruise or bleed. Call your doctor or healthcare provider if you notice any unusual bleeding. Talk to your doctor about your risk of cancer. You may be more at  risk for certain types of cancers if you take this medicine. This medicine may increase your risk of skin cancer. Check your skin for changes to moles or for new growths while taking this medicine. Call your health care provider if you notice any of these skin changes. Do not become pregnant while taking this  medicine or for at least 6 months after stopping it. Women should inform their doctor if they wish to become pregnant or think they might be pregnant. Men should not father a child while taking this medicine and for at least 3 months after stopping it. There is a potential for serious side effects to an unborn child. Talk to your healthcare provider or pharmacist for more information. Do not breast-feed an infant while taking this medicine or for at least 1 week after stopping it. This medicine may make it more difficult to father a child. You should talk with your doctor or healthcare provider if you are concerned about your fertility. What side effects may I notice from receiving this medicine? Side effects that you should report to your doctor or health care professional as soon as possible:  allergic reactions like skin rash, itching or hives, swelling of the face, lips, or tongue  low blood counts - this medicine may decrease the number of white blood cells, red blood cells and platelets. You may be at increased risk for infections and bleeding.  rash, fever, and swollen lymph nodes  redness, blistering, peeling, or loosening of the skin, including inside the mouth  signs of infection like fever or chills, cough, sore throat, pain or difficulty passing urine  signs of decreased platelets or bleeding like bruising, pinpoint red spots on the skin, black, tarry stools, blood in the urine  signs of decreased red blood cells like being unusually weak or tired, fainting spells, lightheadedness  signs and symptoms of kidney injury like trouble passing urine or change in the amount of urine  signs and symptoms of liver injury like dark yellow or brown urine; general ill feeling or flu-like symptoms; light-colored stools; loss of appetite; nausea; right upper belly pain; unusually weak or tired; yellowing of the eyes or skin Side effects that usually do not require medical attention (report to your  doctor or health care professional if they continue or are bothersome):  constipation  decreased appetite  diarrhea  headache  mouth sores  nausea, vomiting  tiredness This list may not describe all possible side effects. Call your doctor for medical advice about side effects. You may report side effects to FDA at 1-800-FDA-1088. Where should I keep my medicine? This drug is given in a hospital or clinic and will not be stored at home. NOTE: This sheet is a summary. It may not cover all possible information. If you have questions about this medicine, talk to your doctor, pharmacist, or health care provider.  2021 [employer] Standard (2019-08-28 12:11:43)

## 2020-06-14 ENCOUNTER — Telehealth: Payer: Self-pay | Admitting: Hematology and Oncology

## 2020-06-14 LAB — PHENYTOIN LEVEL, FREE AND TOTAL: Phenytoin, Total: 10 ug/mL (ref 10.0–20.0)

## 2020-06-14 NOTE — Telephone Encounter (Signed)
Scheduled per los. Called and spoke with patient. Confirmed appt 

## 2020-06-19 ENCOUNTER — Inpatient Hospital Stay: Payer: Medicare Other | Attending: Hematology and Oncology

## 2020-06-19 ENCOUNTER — Other Ambulatory Visit: Payer: Self-pay

## 2020-06-19 DIAGNOSIS — Z79899 Other long term (current) drug therapy: Secondary | ICD-10-CM | POA: Diagnosis not present

## 2020-06-19 DIAGNOSIS — C8251 Diffuse follicle center lymphoma, lymph nodes of head, face, and neck: Secondary | ICD-10-CM

## 2020-06-19 DIAGNOSIS — Z86010 Personal history of colonic polyps: Secondary | ICD-10-CM | POA: Insufficient documentation

## 2020-06-19 DIAGNOSIS — Z8719 Personal history of other diseases of the digestive system: Secondary | ICD-10-CM | POA: Diagnosis not present

## 2020-06-19 DIAGNOSIS — G40909 Epilepsy, unspecified, not intractable, without status epilepticus: Secondary | ICD-10-CM | POA: Diagnosis not present

## 2020-06-19 DIAGNOSIS — R5383 Other fatigue: Secondary | ICD-10-CM | POA: Insufficient documentation

## 2020-06-19 DIAGNOSIS — E785 Hyperlipidemia, unspecified: Secondary | ICD-10-CM | POA: Diagnosis not present

## 2020-06-19 DIAGNOSIS — M129 Arthropathy, unspecified: Secondary | ICD-10-CM | POA: Insufficient documentation

## 2020-06-19 DIAGNOSIS — M1990 Unspecified osteoarthritis, unspecified site: Secondary | ICD-10-CM | POA: Insufficient documentation

## 2020-06-19 DIAGNOSIS — Z7982 Long term (current) use of aspirin: Secondary | ICD-10-CM | POA: Diagnosis not present

## 2020-06-19 DIAGNOSIS — Z9221 Personal history of antineoplastic chemotherapy: Secondary | ICD-10-CM | POA: Insufficient documentation

## 2020-06-19 DIAGNOSIS — Z87891 Personal history of nicotine dependence: Secondary | ICD-10-CM | POA: Insufficient documentation

## 2020-06-19 DIAGNOSIS — C8511 Unspecified B-cell lymphoma, lymph nodes of head, face, and neck: Secondary | ICD-10-CM | POA: Insufficient documentation

## 2020-06-19 DIAGNOSIS — I10 Essential (primary) hypertension: Secondary | ICD-10-CM | POA: Diagnosis not present

## 2020-06-19 LAB — CMP (CANCER CENTER ONLY)
ALT: 14 U/L (ref 0–44)
AST: 14 U/L — ABNORMAL LOW (ref 15–41)
Albumin: 3.9 g/dL (ref 3.5–5.0)
Alkaline Phosphatase: 107 U/L (ref 38–126)
Anion gap: 11 (ref 5–15)
BUN: 19 mg/dL (ref 8–23)
CO2: 25 mmol/L (ref 22–32)
Calcium: 8.6 mg/dL — ABNORMAL LOW (ref 8.9–10.3)
Chloride: 105 mmol/L (ref 98–111)
Creatinine: 0.85 mg/dL (ref 0.61–1.24)
GFR, Estimated: >60 mL/min (ref >60)
Glucose, Bld: 109 mg/dL — ABNORMAL HIGH (ref 70–99)
Potassium: 4.3 mmol/L (ref 3.5–5.1)
Sodium: 141 mmol/L (ref 135–145)
Total Bilirubin: 0.4 mg/dL (ref 0.3–1.2)
Total Protein: 7 g/dL (ref 6.5–8.1)

## 2020-06-19 LAB — CBC WITH DIFFERENTIAL (CANCER CENTER ONLY)
Abs Immature Granulocytes: 0.05 10*3/uL (ref 0.00–0.07)
Basophils Absolute: 0 10*3/uL (ref 0.0–0.1)
Basophils Relative: 1 %
Eosinophils Absolute: 0.4 10*3/uL (ref 0.0–0.5)
Eosinophils Relative: 5 %
HCT: 37.1 % — ABNORMAL LOW (ref 39.0–52.0)
Hemoglobin: 13.1 g/dL (ref 13.0–17.0)
Immature Granulocytes: 1 %
Lymphocytes Relative: 2 %
Lymphs Abs: 0.2 10*3/uL — ABNORMAL LOW (ref 0.7–4.0)
MCH: 32.5 pg (ref 26.0–34.0)
MCHC: 35.3 g/dL (ref 30.0–36.0)
MCV: 92.1 fL (ref 80.0–100.0)
Monocytes Absolute: 0.6 10*3/uL (ref 0.1–1.0)
Monocytes Relative: 9 %
Neutro Abs: 6 10*3/uL (ref 1.7–7.7)
Neutrophils Relative %: 82 %
Platelet Count: 156 10*3/uL (ref 150–400)
RBC: 4.03 MIL/uL — ABNORMAL LOW (ref 4.22–5.81)
RDW: 12.3 % (ref 11.5–15.5)
WBC Count: 7.3 10*3/uL (ref 4.0–10.5)
nRBC: 0 % (ref 0.0–0.2)

## 2020-06-19 LAB — URIC ACID: Uric Acid, Serum: 4.9 mg/dL (ref 3.7–8.6)

## 2020-06-19 LAB — LACTATE DEHYDROGENASE: LDH: 165 U/L (ref 98–192)

## 2020-06-20 ENCOUNTER — Telehealth: Payer: Self-pay | Admitting: *Deleted

## 2020-06-20 LAB — PHENYTOIN LEVEL, FREE AND TOTAL
Phenytoin, Free: 0.7 ug/mL — ABNORMAL LOW (ref 1.0–2.0)
Phenytoin, Total: 10.7 ug/mL (ref 10.0–20.0)

## 2020-06-20 NOTE — Telephone Encounter (Signed)
[name] patient regarding labs from yesterday afternoon. Spoke with him and advised that his labs, including dilantin levels were are normal. He voiced understanding. He states he feels well after his first chemo and that his enlarged lymph node in his neck has significantly decreased in size-about half the size it was prior to starting chemo  He states it is no longer tender. He states he feels well overall.  He is aware of his upcoming appts.

## 2020-06-25 ENCOUNTER — Emergency Department (HOSPITAL_BASED_OUTPATIENT_CLINIC_OR_DEPARTMENT_OTHER): Payer: Medicare Other

## 2020-06-25 ENCOUNTER — Other Ambulatory Visit: Payer: Self-pay

## 2020-06-25 ENCOUNTER — Encounter (HOSPITAL_BASED_OUTPATIENT_CLINIC_OR_DEPARTMENT_OTHER): Payer: Self-pay | Admitting: Emergency Medicine

## 2020-06-25 ENCOUNTER — Emergency Department (HOSPITAL_BASED_OUTPATIENT_CLINIC_OR_DEPARTMENT_OTHER)
Admission: EM | Admit: 2020-06-25 | Discharge: 2020-06-25 | Disposition: A | Discharge status: Home / Self Care | Payer: Medicare Other | Attending: Emergency Medicine | Admitting: Emergency Medicine

## 2020-06-25 DIAGNOSIS — C9110 Chronic lymphocytic leukemia of B-cell type not having achieved remission: Secondary | ICD-10-CM

## 2020-06-25 DIAGNOSIS — Z20822 Contact with and (suspected) exposure to covid-19: Secondary | ICD-10-CM | POA: Insufficient documentation

## 2020-06-25 DIAGNOSIS — R5383 Other fatigue: Secondary | ICD-10-CM

## 2020-06-25 DIAGNOSIS — C9111 Chronic lymphocytic leukemia of B-cell type in remission: Secondary | ICD-10-CM | POA: Insufficient documentation

## 2020-06-25 DIAGNOSIS — R590 Localized enlarged lymph nodes: Secondary | ICD-10-CM | POA: Diagnosis not present

## 2020-06-25 DIAGNOSIS — R319 Hematuria, unspecified: Secondary | ICD-10-CM | POA: Diagnosis not present

## 2020-06-25 DIAGNOSIS — R509 Fever, unspecified: Secondary | ICD-10-CM

## 2020-06-25 DIAGNOSIS — I10 Essential (primary) hypertension: Secondary | ICD-10-CM | POA: Insufficient documentation

## 2020-06-25 DIAGNOSIS — Z79899 Other long term (current) drug therapy: Secondary | ICD-10-CM | POA: Insufficient documentation

## 2020-06-25 DIAGNOSIS — Z87891 Personal history of nicotine dependence: Secondary | ICD-10-CM | POA: Insufficient documentation

## 2020-06-25 DIAGNOSIS — Z7982 Long term (current) use of aspirin: Secondary | ICD-10-CM | POA: Diagnosis not present

## 2020-06-25 LAB — URINALYSIS, ROUTINE W REFLEX MICROSCOPIC
Bilirubin Urine: NEGATIVE
Glucose, UA: NEGATIVE mg/dL
Ketones, ur: NEGATIVE mg/dL
Leukocytes,Ua: NEGATIVE
Nitrite: NEGATIVE
Specific Gravity, Urine: 1.018 (ref 1.005–1.030)
pH: 5.5 (ref 5.0–8.0)

## 2020-06-25 LAB — COMPREHENSIVE METABOLIC PANEL
ALT: 13 U/L (ref 0–44)
AST: 14 U/L — ABNORMAL LOW (ref 15–41)
Albumin: 4.1 g/dL (ref 3.5–5.0)
Alkaline Phosphatase: 93 U/L (ref 38–126)
Anion gap: 8 (ref 5–15)
BUN: 12 mg/dL (ref 8–23)
CO2: 27 mmol/L (ref 22–32)
Calcium: 9.1 mg/dL (ref 8.9–10.3)
Chloride: 101 mmol/L (ref 98–111)
Creatinine, Ser: 0.81 mg/dL (ref 0.61–1.24)
GFR, Estimated: >60 mL/min (ref >60)
Glucose, Bld: 129 mg/dL — ABNORMAL HIGH (ref 70–99)
Potassium: 4 mmol/L (ref 3.5–5.1)
Sodium: 136 mmol/L (ref 135–145)
Total Bilirubin: 0.4 mg/dL (ref 0.3–1.2)
Total Protein: 7 g/dL (ref 6.5–8.1)

## 2020-06-25 LAB — CBC WITH DIFFERENTIAL/PLATELET
Abs Immature Granulocytes: 0.02 10*3/uL (ref 0.00–0.07)
Basophils Absolute: 0 10*3/uL (ref 0.0–0.1)
Basophils Relative: 0 %
Eosinophils Absolute: 0.3 10*3/uL (ref 0.0–0.5)
Eosinophils Relative: 4 %
HCT: 38.4 % — ABNORMAL LOW (ref 39.0–52.0)
Hemoglobin: 13.3 g/dL (ref 13.0–17.0)
Immature Granulocytes: 0 %
Lymphocytes Relative: 3 %
Lymphs Abs: 0.2 10*3/uL — ABNORMAL LOW (ref 0.7–4.0)
MCH: 32 pg (ref 26.0–34.0)
MCHC: 34.6 g/dL (ref 30.0–36.0)
MCV: 92.3 fL (ref 80.0–100.0)
Monocytes Absolute: 0.5 10*3/uL (ref 0.1–1.0)
Monocytes Relative: 6 %
Neutro Abs: 6.3 10*3/uL (ref 1.7–7.7)
Neutrophils Relative %: 87 %
Platelets: 146 10*3/uL — ABNORMAL LOW (ref 150–400)
RBC: 4.16 MIL/uL — ABNORMAL LOW (ref 4.22–5.81)
RDW: 12.6 % (ref 11.5–15.5)
WBC: 7.4 10*3/uL (ref 4.0–10.5)
nRBC: 0 % (ref 0.0–0.2)

## 2020-06-25 LAB — RESP PANEL BY RT-PCR (FLU A&B, COVID) ARPGX2
Influenza A by PCR: NEGATIVE
Influenza B by PCR: NEGATIVE
SARS Coronavirus 2 by RT PCR: NEGATIVE

## 2020-06-25 LAB — LACTIC ACID, PLASMA: Lactic Acid, Venous: 1.3 mmol/L (ref 0.5–1.9)

## 2020-06-25 MED ORDER — ACETAMINOPHEN 325 MG PO TABS
650.0000 mg | ORAL_TABLET | Freq: Once | ORAL | Status: AC
  Administered 2020-06-25: 650 mg via ORAL
  Filled 2020-06-25: qty 2

## 2020-06-25 NOTE — Discharge Instructions (Signed)
You were seen in the emergency department for a fever and generalized fatigue for 1 day.  You had lab work,urinalysis and chest x-ray that did not show any significant abnormalities.  Your blood cultures are pending at time of discharge.  Please contact Dr. Libby Maw office tomorrow for close follow-up.  Return to the emergency department if any worsening fevers or new or concerning symptoms.

## 2020-06-25 NOTE — ED Triage Notes (Addendum)
Cancer pt  Non hog lymph , Fever started today last tylenol at 12 noon demies dysuria and cough

## 2020-06-25 NOTE — ED Provider Notes (Signed)
Brian Castillo Provider Note   CSN: [id number] Arrival date & time: 06/25/20  1726     History Chief Complaint  Patient presents with  . Fever    Brian Castillo is a 80 y.o. male.  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  Oncologist is Dr. Lorenso Courier.  Today he woke up and felt fatigued.  Had a low-grade temperature.  T-max has gone up to 101.0.  No other localizing symptoms including no cough shortness of breath abdominal pain vomiting diarrhea or dysuria.  No rashes.  No sick contacts.  He is Covid vaccinated and boosted.  The history is provided by the patient.  Fever Max temp prior to arrival:  101 Temp source:  Oral Severity:  Moderate Onset quality:  Gradual Duration:  12 hours Timing:  Intermittent Progression:  Unchanged Chronicity:  New Relieved by:  None tried Worsened by:  Nothing Ineffective treatments:  None tried Associated symptoms: no chest pain, no chills, no congestion, no cough, no diarrhea, no dysuria, no headaches, no myalgias, no nausea, no rash, no rhinorrhea, no sore throat and no vomiting   Risk factors: no sick contacts        Past Medical History:  Diagnosis Date  . Allergy    mild  . Arthritis   . Cataract    forming  . Colon polyps   . GERD (gastroesophageal reflux disease)    15 -20 years ago -none currently  . Glaucoma   . Hyperlipidemia   . Hypertension   . 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 Seizures ([place])    last seizure was in 1988    Patient Active Problem List   Diagnosis Date Noted  . 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/24/2014    Past Surgical History:  Procedure Laterality Date  . CARPOMETACARPEL SUSPENSION PLASTY Right 08/24/2014   Procedure: RIGHT CMC ARTHOPLASTY WITH DOUBLE TENDON TRANSFER AND REPAIR  RECONSTRUCTION ;  Surgeon: Roseanne Kaufman, MD;   Location: [facility];  Service: Orthopedics;  Laterality: Right;  . CHOLECYSTECTOMY    . COLONOSCOPY    . KNEE ARTHROSCOPY  2003   bilateral  . POLYPECTOMY         Family History  Problem Relation Age of Onset  . Colon cancer Neg Hx   . Colon polyps Neg Hx     Social History   Tobacco Use  . Smoking status: Former Research scientist (life sciences)  . Smokeless tobacco: Never Used  . Tobacco comment: quit 1984  Substance Use Topics  . Alcohol use: Yes    Alcohol/week: 14.0 standard drinks    Types: 14 Glasses of wine per week  . Drug use: No    Home Medications Prior to Admission medications   Medication Sig Start Date End Date Taking? Authorizing Provider  acyclovir (ZOVIRAX) 400 MG tablet Take 1 tablet (400 mg total) by mouth 2 (two) times daily. 05/29/20   Orson Slick, MD  allopurinol (ZYLOPRIM) 300 MG tablet Take 1 tablet (300 mg total) by mouth daily. 05/29/20   Orson Slick, MD  amLODipine (NORVASC) 5 MG tablet Take 5 mg by mouth Daily.  11/28/11   [provider]  aspirin 81 MG tablet Take 81 mg by mouth daily.    [provider]  Calcium Carb-Cholecalciferol (CALCIUM 600 + D PO)  Take 1 tablet by mouth daily.    [provider]  cholecalciferol (VITAMIN D) 1000 units tablet Take 1,000 Units by mouth daily.    [provider]  dexamethasone (DECADRON) 4 MG tablet Take 2 tablets (8 mg total) by mouth as directed. Take 2 pills (8mg ) twice daily on Days 3 and 4 of treatment 05/29/20   Orson Slick, MD  Latanoprostene Bunod (VYZULTA) 0.024 % SOLN Place 1 drop into both eyes at bedtime.    [provider]  metoprolol succinate (TOPROL-XL) 25 MG 24 hr tablet Take 25 mg by mouth Daily.  11/28/11   [provider]  ondansetron (ZOFRAN) 8 MG tablet Take 1 tablet (8 mg total) by mouth every 8 (eight) hours as needed for nausea or vomiting. 05/29/20   Orson Slick, MD  phenytoin (DILANTIN) 100 MG ER capsule Take 200 mg by  mouth 2 (two) times daily.    [provider]  pravastatin (PRAVACHOL) 80 MG tablet Take 80 mg by mouth Daily.  11/28/11   [provider]  prochlorperazine (COMPAZINE) 10 MG tablet Take 1 tablet (10 mg total) by mouth every 6 (six) hours as needed for nausea or vomiting. 05/29/20   Orson Slick, MD  timolol (BETIMOL) 0.5 % ophthalmic solution Place 1 drop into both eyes 2 (two) times daily.    [provider]    Allergies    Brimonidine tartrate-timolol, Lisinopril, and Netarsudil dimesylate  Review of Systems   Review of Systems  Constitutional: Positive for fatigue and fever. Negative for chills.  HENT: Negative for congestion, rhinorrhea and sore throat.   Eyes: Negative for visual disturbance.  Respiratory: Negative for cough and shortness of breath.   Cardiovascular: Negative for chest pain.  Gastrointestinal: Negative for abdominal pain, diarrhea, nausea and vomiting.  Genitourinary: Negative for dysuria.  Musculoskeletal: Negative for myalgias.  Skin: Negative for rash.  Neurological: Negative for headaches.    Physical Exam Updated Vital Signs BP 131/70 (BP Location: Right Arm)   Pulse 79   Temp 100.3 F (37.9 C) (Oral)   Resp 18   Ht 5\' 8"  (1.727 m)   Wt 77.1 kg   SpO2 97%   BMI 25.85 kg/m   Physical Exam Vitals and nursing note reviewed.  Constitutional:      Appearance: Normal appearance. He is well-developed.  HENT:     Head: Normocephalic and atraumatic.  Eyes:     Conjunctiva/sclera: Conjunctivae normal.  Cardiovascular:     Rate and Rhythm: Normal rate and regular rhythm.     Heart sounds: No murmur heard.   Pulmonary:     Effort: Pulmonary effort is normal. No respiratory distress.     Breath sounds: Normal breath sounds.  Abdominal:     Palpations: Abdomen is soft.     Tenderness: There is no abdominal tenderness.  Musculoskeletal:        General: No deformity or signs of injury. Normal range of motion.      Cervical back: Neck supple.  Lymphadenopathy:     Cervical: Cervical adenopathy present.  Skin:    General: Skin is warm and dry.  Neurological:     General: No focal deficit present.     Mental Status: He is alert.     ED Results / Procedures / Treatments   Labs (all labs ordered are listed, but only abnormal results are displayed) Labs Reviewed  COMPREHENSIVE METABOLIC PANEL - Abnormal; Notable for the following components:  Result Value   Glucose, Bld 129 (*)    AST 14 (*)    All other components within normal limits  CBC WITH DIFFERENTIAL/PLATELET - Abnormal; Notable for the following components:   RBC 4.16 (*)    HCT 38.4 (*)    Platelets 146 (*)    Lymphs Abs 0.2 (*)    All other components within normal limits  URINALYSIS, ROUTINE W REFLEX MICROSCOPIC - Abnormal; Notable for the following components:   Hgb urine dipstick LARGE (*)    Protein, ur TRACE (*)    All other components within normal limits  CULTURE, BLOOD (ROUTINE X 2)  CULTURE, BLOOD (ROUTINE X 2)  RESP PANEL BY RT-PCR (FLU A&B, COVID) ARPGX2  URINE CULTURE  LACTIC ACID, PLASMA    EKG None  Radiology DG Chest Port 1 View  Result Date: 06/25/2020 CLINICAL DATA:  Fever EXAM: PORTABLE CHEST 1 VIEW COMPARISON:  None. FINDINGS: The heart size and mediastinal contours are within normal limits. Aortic atherosclerosis. Both lungs are clear. The visualized skeletal structures are unremarkable. IMPRESSION: No active disease. Electronically Signed   By: Donavan Foil M.D.   On: 06/25/2020 18:31    Procedures Procedures   Medications Ordered in ED Medications  acetaminophen (TYLENOL) tablet 650 mg (650 mg Oral Given 06/25/20 1846)    ED Course  I have reviewed the triage vital signs and the nursing notes.  Pertinent labs & imaging results that were available during my care of the patient were reviewed by me and considered in my medical decision making (see chart for details).  Clinical Course as of  06/26/20 0955  Tue Jun 25, 2020  1837 Chest x-ray interpreted by me as no acute infiltrates. [MB]  2037 Reviewed results with patient and wife.  He does have some microscopic hematuria but does not endorse any symptoms.  Not immune compromise currently so no indications for antibiotics.  Recommended close follow-up with Dr. Lorenso Courier and return if any worsening symptoms. [MB]    Clinical Course User Index [MB] Hayden Rasmussen, MD   MDM Rules/Calculators/A&P                         This patient complains of fever and fatigue; this involves an extensive number of treatment Options and is a complaint that carries with it a high risk of complications and Morbidity. The differential includes infection including pneumonia, UTI, bacteremia, sepsis  I ordered, reviewed and interpreted labs, which included CBC with normal white count normal hemoglobin, chemistries fairly unremarkable, normal LFTs, lactate normal, Covid and flu testing negative, urinalysis with some hematuria no symptoms I ordered medication Tylenol I ordered imaging studies which included chest x-ray and I independently    visualized and interpreted imaging which showed no acute infiltrates Additional history obtained from patient's wife Previous records obtained and reviewed in epic, no recent admissions  After the interventions stated above, I reevaluated the patient and found patient to be minimally symptomatic and hemodynamically stable.  No clear infectious source.  Although immune compromised not neutropenic.  No indications for empiric antibiotic.  Recommend close follow-up with oncology and follow-up with cultures.  Return instructions discussed   Final Clinical Impression(s) / ED Diagnoses Final diagnoses:  Fever, unspecified fever cause  Fatigue, unspecified type  CLL (chronic lymphocytic leukemia) (HCC)  Hematuria, unspecified type    Rx / DC Orders ED Discharge Orders    None       Hayden Rasmussen,  MD 06/26/20 9847

## 2020-06-26 ENCOUNTER — Inpatient Hospital Stay: Payer: Medicare Other

## 2020-06-26 ENCOUNTER — Telehealth: Payer: Self-pay | Admitting: *Deleted

## 2020-06-26 NOTE — Telephone Encounter (Signed)
Received call from patient. He states he had gone to the ED yesterday 2 [facility] due to fever of 101 and increased fatigue.  He was ultimately sent back home but was told to follow up with his oncologist. Advised that Dr. Lorenso Courier was made aware of his trip to the ED. Advised that he did the right thing by going there. Reviewed his labs with him and advised that they looked good, no evidence of infection. Advised that we will still see him as scheduled on Friday, 06/28/20.  He voices understanding and knows to call with any further questions or concerns, fever etc.

## 2020-06-27 LAB — URINE CULTURE: Culture: <10000 — AB

## 2020-06-28 ENCOUNTER — Other Ambulatory Visit: Payer: Self-pay

## 2020-06-28 ENCOUNTER — Inpatient Hospital Stay: Payer: Medicare Other | Admitting: Hematology and Oncology

## 2020-06-28 ENCOUNTER — Inpatient Hospital Stay: Payer: Medicare Other

## 2020-06-28 ENCOUNTER — Other Ambulatory Visit (HOSPITAL_COMMUNITY): Payer: Self-pay

## 2020-06-28 VITALS — BP 127/74 | HR 68 | Temp 97.7°F | Resp 18 | Ht 68.0 in | Wt 173.5 lb

## 2020-06-28 DIAGNOSIS — C8251 Diffuse follicle center lymphoma, lymph nodes of head, face, and neck: Secondary | ICD-10-CM

## 2020-06-28 DIAGNOSIS — C8511 Unspecified B-cell lymphoma, lymph nodes of head, face, and neck: Secondary | ICD-10-CM | POA: Diagnosis not present

## 2020-06-28 LAB — CMP (CANCER CENTER ONLY)
ALT: 13 U/L (ref 0–44)
AST: 15 U/L (ref 15–41)
Albumin: 3.7 g/dL (ref 3.5–5.0)
Alkaline Phosphatase: 107 U/L (ref 38–126)
Anion gap: 12 (ref 5–15)
BUN: 16 mg/dL (ref 8–23)
CO2: 24 mmol/L (ref 22–32)
Calcium: 8.9 mg/dL (ref 8.9–10.3)
Chloride: 103 mmol/L (ref 98–111)
Creatinine: 0.82 mg/dL (ref 0.61–1.24)
GFR, Estimated: >60 mL/min (ref >60)
Glucose, Bld: 134 mg/dL — ABNORMAL HIGH (ref 70–99)
Potassium: 3.9 mmol/L (ref 3.5–5.1)
Sodium: 139 mmol/L (ref 135–145)
Total Bilirubin: 0.3 mg/dL (ref 0.3–1.2)
Total Protein: 7.7 g/dL (ref 6.5–8.1)

## 2020-06-28 LAB — CBC WITH DIFFERENTIAL (CANCER CENTER ONLY)
Abs Immature Granulocytes: 0.02 10*3/uL (ref 0.00–0.07)
Basophils Absolute: 0.1 10*3/uL (ref 0.0–0.1)
Basophils Relative: 1 %
Eosinophils Absolute: 0.4 10*3/uL (ref 0.0–0.5)
Eosinophils Relative: 6 %
HCT: 36.7 % — ABNORMAL LOW (ref 39.0–52.0)
Hemoglobin: 12.9 g/dL — ABNORMAL LOW (ref 13.0–17.0)
Immature Granulocytes: 0 %
Lymphocytes Relative: 7 %
Lymphs Abs: 0.5 10*3/uL — ABNORMAL LOW (ref 0.7–4.0)
MCH: 32.6 pg (ref 26.0–34.0)
MCHC: 35.1 g/dL (ref 30.0–36.0)
MCV: 92.7 fL (ref 80.0–100.0)
Monocytes Absolute: 0.6 10*3/uL (ref 0.1–1.0)
Monocytes Relative: 8 %
Neutro Abs: 6 10*3/uL (ref 1.7–7.7)
Neutrophils Relative %: 78 %
Platelet Count: 153 10*3/uL (ref 150–400)
RBC: 3.96 MIL/uL — ABNORMAL LOW (ref 4.22–5.81)
RDW: 12.9 % (ref 11.5–15.5)
WBC Count: 7.6 10*3/uL (ref 4.0–10.5)
nRBC: 0 % (ref 0.0–0.2)

## 2020-06-28 LAB — LACTATE DEHYDROGENASE: LDH: 217 U/L — ABNORMAL HIGH (ref 98–192)

## 2020-06-28 LAB — URIC ACID: Uric Acid, Serum: 4.3 mg/dL (ref 3.7–8.6)

## 2020-06-28 NOTE — Progress Notes (Signed)
[facility] [phone]   [phone]  PROGRESS NOTE  Patient Care Team: Shon Baton, MD as PCP - General (Internal Medicine)  Hematological/Oncological History #Follicular lymphoma Stage III 1) 01/30/2020: CT neck shows numerous enlarged lymph nodes, especially in the right submandibular space - which is the palpable abnormality. 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 Lorenso Courier  4) 02/22/2020: PET CT scan showed Deauville 4 and Deauville 5 adenopathy bilaterally in the neck. There is also a Deauville 4 left supraclavicular lymph node and a Deauville 4 right gastric lymph node 5) 04/24/2020: presents with rapid enlargement of his right submandibular lymph node.  6) 05/20/2020: partial excision of right submandibular lymph node with attempted fluid drainage. Pathology consistent with follicular lymphoma, no clear evidence of transformation.  7) 06/12/2020: Cycle 1 Day 1 of R-Benda.   Interval History:  Brian Castillo 80 y.o. 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 The patient's last visit was on 06/12/2020 at the time he started treatment with rituximab and bendamustine. In the interim since the last visit the patient is started cycle 1 day 1 of R. Oneal Grout and now presents for an interval 2-week visit.  On exam today Brian Castillo notes he has had marked decrease in the size of his right sided cervical lymph node.  He reports that is down at least 50% since the start of treatment.  Unfortunately he has experienced a good amount of fatigue throughout the treatment with his chemotherapy and notes that he was pretty wiped out over the last 2 weeks.  He reports other than that he did have a fever of approximately 27 F for which he presented to the emergency department.  A full infectious work-up at that time revealed no clear  etiology and he was discharged home.  Since that time he has had no further episodes of fever.  Unfortunately he has also decreased in weight by about 5 pounds down to 173 from 178 in February.  Other than that he reports that he feels good and is willing and able to proceed with treatment with a next cycle is due in 2 weeks.  He has not been having any issues with fevers, chills, sweats, nausea, vomiting or diarrhea.  None of his other lymph nodes appear enlarged at this time.  A full 10 point ROS is listed below.  MEDICAL HISTORY:  Past Medical History:  Diagnosis Date  . Allergy    mild  . Arthritis   . Cataract    forming  . Colon polyps   . GERD (gastroesophageal reflux disease)    15 -20 years ago -none currently  . Glaucoma   . Hyperlipidemia   . Hypertension   . 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 Seizures ([place])    last seizure was in 1988    SURGICAL HISTORY: Past Surgical History:  Procedure Laterality Date  . CARPOMETACARPEL SUSPENSION PLASTY Right 08/24/2014   Procedure: RIGHT CMC ARTHOPLASTY WITH DOUBLE TENDON TRANSFER AND REPAIR  RECONSTRUCTION ;  Surgeon: Roseanne Kaufman, MD;  Location: [facility];  Service: Orthopedics;  Laterality: Right;  . CHOLECYSTECTOMY    . COLONOSCOPY    . KNEE ARTHROSCOPY  2003   bilateral  . POLYPECTOMY      SOCIAL HISTORY: Social History   Socioeconomic History  . Marital status: Married  Spouse name: Not on file  . Number of children: Not on file  . Years of education: Not on file  . Highest education level: Not on file  Occupational History  . Not on file  Tobacco Use  . Smoking status: Former Research scientist (life sciences)  . Smokeless tobacco: Never Used  . Tobacco comment: quit 1984  Substance and Sexual Activity  . Alcohol use: Yes    Alcohol/week: 14.0 standard drinks    Types: 14 Glasses of wine per week  . Drug use: No  . Sexual activity: Not on file  Other Topics Concern  . Not on file  Social History  Narrative  . Not on file   Social Determinants of Health   Financial Resource Strain: Not on file  Food Insecurity: Not on file  Transportation Needs: Not on file  Physical Activity: Not on file  Stress: Not on file  Social Connections: Not on file  Intimate Partner Violence: Not on file    FAMILY HISTORY: Family History  Problem Relation Age of Onset  . Colon cancer Neg Hx   . Colon polyps Neg Hx     ALLERGIES:  is allergic to brimonidine tartrate-timolol, lisinopril, and netarsudil dimesylate.  MEDICATIONS:  Current Outpatient Medications  Medication Sig Dispense Refill  . acyclovir (ZOVIRAX) 400 MG tablet Take 1 tablet (400 mg total) by mouth 2 (two) times daily. 60 tablet 5  . allopurinol (ZYLOPRIM) 300 MG tablet Take 1 tablet (300 mg total) by mouth daily. 90 tablet 1  . amLODipine (NORVASC) 5 MG tablet Take 5 mg by mouth Daily.     [name] aspirin 81 MG tablet Take 81 mg by mouth daily.    . Calcium Carb-Cholecalciferol (CALCIUM 600 + D PO) Take 1 tablet by mouth daily.    . cholecalciferol (VITAMIN D) 1000 units tablet Take 1,000 Units by mouth daily.    [name] dexamethasone (DECADRON) 4 MG tablet Take 2 tablets (8 mg total) by mouth as directed. Take 2 pills (8mg ) twice daily on Days 3 and 4 of treatment 8 tablet 5  . Latanoprostene Bunod (VYZULTA) 0.024 % SOLN Place 1 drop into both eyes at bedtime.    . metoprolol succinate (TOPROL-XL) 25 MG 24 hr tablet Take 25 mg by mouth Daily.     . ondansetron (ZOFRAN) 8 MG tablet Take 1 tablet (8 mg total) by mouth every 8 (eight) hours as needed for nausea or vomiting. 30 tablet 0  . phenytoin (DILANTIN) 100 MG ER capsule Take 200 mg by mouth 2 (two) times daily.    . pravastatin (PRAVACHOL) 80 MG tablet Take 80 mg by mouth Daily.     . prochlorperazine (COMPAZINE) 10 MG tablet Take 1 tablet (10 mg total) by mouth every 6 (six) hours as needed for nausea or vomiting. 30 tablet 0  . timolol (BETIMOL) 0.5 % ophthalmic solution Place 1 drop  into both eyes 2 (two) times daily.     No current facility-administered medications for this visit.    REVIEW OF SYSTEMS:   Constitutional: ( - ) fevers, ( - )  chills , ( - ) night sweats Eyes: ( - ) blurriness of vision, ( - ) double vision, ( - ) watery eyes Ears, nose, mouth, throat, and face: ( - ) mucositis, ( - ) sore throat Respiratory: ( - ) cough, ( - ) dyspnea, ( - ) wheezes Cardiovascular: ( - ) palpitation, ( - ) chest discomfort, ( - ) lower extremity swelling Gastrointestinal:  ( - )  nausea, ( - ) heartburn, ( - ) change in bowel habits Skin: ( - ) abnormal skin rashes Lymphatics: ( - ) new lymphadenopathy, ( - ) easy bruising Neurological: ( - ) numbness, ( - ) tingling, ( - ) new weaknesses Behavioral/Psych: ( - ) mood change, ( - ) new changes  All other systems were reviewed with the patient and are negative.  PHYSICAL EXAMINATION: ECOG PERFORMANCE STATUS: 1 - Symptomatic but completely ambulatory  Vitals:   06/28/20 1521  BP: 127/74  Pulse: 68  Resp: 18  Temp: 97.7 F (36.5 C)  SpO2: 100%   Filed Weights   06/28/20 1521  Weight: 173 lb 8 oz (78.7 kg)    GENERAL: well appearing elderly Caucasian male in NAD  SKIN: skin color, texture, turgor are normal, no rashes or significant lesions EYES: conjunctiva are pink and non-injected, sclera clear NECK: supple, non-tender LYMPH:  Single large right sided cervical lymph node, continuing to enlarge. Otherwise no marked palpable lymphadenopathy in the cervical, axillary or supraclavicular lymph nodes.  LUNGS: clear to auscultation and percussion with normal breathing effort HEART: regular rate & rhythm and no murmurs and no lower extremity edema Musculoskeletal: no cyanosis of digits and no clubbing  PSYCH: alert & oriented x 3, fluent speech NEURO: no focal motor/sensory deficits  LABORATORY DATA:  I have reviewed the data as listed CBC Latest Ref Rng & Units 06/28/2020 06/25/2020 06/19/2020  WBC 4.0 - 10.5  K/uL 7.6 7.4 7.3  Hemoglobin 13.0 - 17.0 g/dL 12.9(L) 13.3 13.1  Hematocrit 39.0 - 52.0 % 36.7(L) 38.4(L) 37.1(L)  Platelets 150 - 400 K/uL 153 146(L) 156    CMP Latest Ref Rng & Units 06/28/2020 06/25/2020 06/19/2020  Glucose 70 - 99 mg/dL 134(H) 129(H) 109(H)  BUN 8 - 23 mg/dL 16 12 19   Creatinine 0.61 - 1.24 mg/dL 0.82 0.81 0.85  Sodium 135 - 145 mmol/L 139 136 141  Potassium 3.5 - 5.1 mmol/L 3.9 4.0 4.3  Chloride 98 - 111 mmol/L 103 101 105  CO2 22 - 32 mmol/L 24 27 25   Calcium 8.9 - 10.3 mg/dL 8.9 9.1 8.6(L)  Total Protein 6.5 - 8.1 g/dL 7.7 7.0 7.0  Total Bilirubin 0.3 - 1.2 mg/dL 0.3 0.4 0.4  Alkaline Phos 38 - 126 U/L 107 93 107  AST 15 - 41 U/L 15 14(L) 14(L)  ALT 0 - 44 U/L 13 13 14    RADIOGRAPHIC STUDIES: I have personally reviewed the radiological images as listed and agreed with the findings in the report: stable lymphadenopathy with exception of the rapidly enlarging submandibular lymph node.   DG Chest Port 1 View  Result Date: 06/25/2020 CLINICAL DATA:  Fever EXAM: PORTABLE CHEST 1 VIEW COMPARISON:  None. FINDINGS: The heart size and mediastinal contours are within normal limits. Aortic atherosclerosis. Both lungs are clear. The visualized skeletal structures are unremarkable. IMPRESSION: No active disease. Electronically Signed   By: Donavan Foil M.D.   On: 06/25/2020 18:31    ASSESSMENT & PLAN Brian Castillo 80 y.o. 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 The patient's last visit was on 02/12/2020 at which time he establish care.  After review the labs, the records, discussion with the patient the findings are most consistent with a stage III follicular lymphoma based on the PET CT scan.  There are lymph nodes on both sides of diaphragm and therefore stage III would be the most appropriate designation.  Previously we discussed the nature of follicular lymphoma and the treatment options moving forward.   The options at this time would be observation, immunotherapy, or combination of chemotherapy and immunotherapy.  Given the lack of symptoms, cytopenias, and bulky disease at the time of presentation we decided to proceed with observation alone. Unfortunately due to rapid progression we need to consider treatment moving forward.  Additionally we discussed rituximab monotherapy with weekly dosing x4 doses followed by every 2 months dosing x4 as a treatment option as well as bendamustine and rituximab on days 1 and 2 of a 28-day cycle x6.  We discussed the side effects, risks and benefits, and possible outcomes of treatment.  The patient voiced his understanding of these options and wished to proceed with R-Benda.   Over the last month the patient was found to have a rapidly enlarging right submandibular lymph node.  Due to concern for this enlarging lymph node we ordered a CT scan of the chest abdomen pelvis to assess for progression elsewhere, but that was the only lymph node that was progressing. Biopsy with ENT showed no progression into a DLBCL or other aggressive variant.  #Follicular lymphoma, Stage III --prior PET CT findings are consistent with a Stage III follicular lymphoma. Due to rapidly enlarging submandibular lymph node will proceed with treatment.  --imaging shows stable disease save for a single enlarging submandibular lymph node.  Biopsy has not shown transformation into a more aggressive lymphoma.  --after discussion with the patient, he is agreeable to starting Bendamustine/Rituximab as first line treatment.  --started Cycle 1 Day 1 of R-Benda chemotherapy on 06/12/2020 --RTC in 2 weeks time for Cycle 2 with interval weekly lab visits.  #Medication Interaction --Allopurinol increases the levels of Dilantin. --We will work with the patient's primary care provider Dr. Shon Baton to monitor his Dilantin levels and adjust accordingly. For now we are following this weekly.   #Fever,  resolved --Single episode of fever up to 101 F occurred on 06/25/2020. --Patient was not neutropenic at that time and was evaluated emergency department with infectious work-up including chest x-ray, blood cultures, and urine culture.  Date at that time showed no evidence of infectious disease. --No further fevers noted since that time.  We will continue to monitor.  #Supportive Care --chemotherapy education completed --zofran 8mg  q8H PRN and compazine 10mg  PO q6H for nausea --acyclovir 400mg  PO BID for VCZ prophylaxis -- allopurinol 300mg  PO daily for TLS prophylaxis -- no port required for this regimen.  -- no pain medication required at this time.    No orders of the defined types were placed in this encounter.  All questions were answered. The patient knows to call the clinic with any problems, questions or concerns.  A total of more than 30 minutes were spent on this encounter and over half of that time was spent on counseling and coordination of care as outlined above.   Ledell Peoples, MD Department of Hematology/Oncology [facility] Phone: [phone] Pager: [phone] Email: [email or web address]  06/28/2020 3:47 PM

## 2020-06-29 ENCOUNTER — Encounter: Payer: Self-pay | Admitting: Hematology and Oncology

## 2020-06-30 LAB — CULTURE, BLOOD (ROUTINE X 2)
Culture: NO GROWTH
Culture: NO GROWTH
Special Requests: ADEQUATE
Special Requests: ADEQUATE

## 2020-07-01 ENCOUNTER — Telehealth: Payer: Self-pay | Admitting: Hematology and Oncology

## 2020-07-01 LAB — PHENYTOIN LEVEL, FREE AND TOTAL
Phenytoin, Free: NOT DETECTED ug/mL (ref 1.0–2.0)
Phenytoin, Total: 5.6 ug/mL — ABNORMAL LOW (ref 10.0–20.0)

## 2020-07-01 NOTE — Telephone Encounter (Signed)
Left message with follow-up appointments per 4/15 los. Gave option to call back to reschedule if needed.

## 2020-07-03 ENCOUNTER — Other Ambulatory Visit: Payer: Self-pay

## 2020-07-03 ENCOUNTER — Inpatient Hospital Stay: Payer: Medicare Other

## 2020-07-03 DIAGNOSIS — C8251 Diffuse follicle center lymphoma, lymph nodes of head, face, and neck: Secondary | ICD-10-CM

## 2020-07-03 DIAGNOSIS — C8511 Unspecified B-cell lymphoma, lymph nodes of head, face, and neck: Secondary | ICD-10-CM | POA: Diagnosis not present

## 2020-07-03 LAB — CBC WITH DIFFERENTIAL (CANCER CENTER ONLY)
Abs Immature Granulocytes: 0.01 10*3/uL (ref 0.00–0.07)
Basophils Absolute: 0.1 10*3/uL (ref 0.0–0.1)
Basophils Relative: 2 %
Eosinophils Absolute: 0.4 10*3/uL (ref 0.0–0.5)
Eosinophils Relative: 7 %
HCT: 37.4 % — ABNORMAL LOW (ref 39.0–52.0)
Hemoglobin: 12.9 g/dL — ABNORMAL LOW (ref 13.0–17.0)
Immature Granulocytes: 0 %
Lymphocytes Relative: 9 %
Lymphs Abs: 0.5 10*3/uL — ABNORMAL LOW (ref 0.7–4.0)
MCH: 32.1 pg (ref 26.0–34.0)
MCHC: 34.5 g/dL (ref 30.0–36.0)
MCV: 93 fL (ref 80.0–100.0)
Monocytes Absolute: 0.7 10*3/uL (ref 0.1–1.0)
Monocytes Relative: 12 %
Neutro Abs: 3.9 10*3/uL (ref 1.7–7.7)
Neutrophils Relative %: 70 %
Platelet Count: 189 10*3/uL (ref 150–400)
RBC: 4.02 MIL/uL — ABNORMAL LOW (ref 4.22–5.81)
RDW: 12.9 % (ref 11.5–15.5)
WBC Count: 5.6 10*3/uL (ref 4.0–10.5)
nRBC: 0 % (ref 0.0–0.2)

## 2020-07-03 LAB — CMP (CANCER CENTER ONLY)
ALT: 23 U/L (ref 0–44)
AST: 22 U/L (ref 15–41)
Albumin: 3.8 g/dL (ref 3.5–5.0)
Alkaline Phosphatase: 108 U/L (ref 38–126)
Anion gap: 9 (ref 5–15)
BUN: 17 mg/dL (ref 8–23)
CO2: 25 mmol/L (ref 22–32)
Calcium: 9.4 mg/dL (ref 8.9–10.3)
Chloride: 107 mmol/L (ref 98–111)
Creatinine: 0.83 mg/dL (ref 0.61–1.24)
GFR, Estimated: >60 mL/min (ref >60)
Glucose, Bld: 106 mg/dL — ABNORMAL HIGH (ref 70–99)
Potassium: 4.6 mmol/L (ref 3.5–5.1)
Sodium: 141 mmol/L (ref 135–145)
Total Bilirubin: 0.3 mg/dL (ref 0.3–1.2)
Total Protein: 7.8 g/dL (ref 6.5–8.1)

## 2020-07-03 LAB — URIC ACID: Uric Acid, Serum: 4 mg/dL (ref 3.7–8.6)

## 2020-07-03 LAB — LACTATE DEHYDROGENASE: LDH: 219 U/L — ABNORMAL HIGH (ref 98–192)

## 2020-07-04 LAB — PHENYTOIN LEVEL, FREE AND TOTAL
Phenytoin, Free: NOT DETECTED ug/mL (ref 1.0–2.0)
Phenytoin, Total: 7.2 ug/mL — ABNORMAL LOW (ref 10.0–20.0)

## 2020-07-10 ENCOUNTER — Inpatient Hospital Stay: Payer: Medicare Other

## 2020-07-10 ENCOUNTER — Other Ambulatory Visit: Payer: Self-pay

## 2020-07-10 ENCOUNTER — Inpatient Hospital Stay: Payer: Medicare Other | Admitting: Hematology and Oncology

## 2020-07-10 ENCOUNTER — Inpatient Hospital Stay: Payer: Medicare Other | Admitting: Dietician

## 2020-07-10 ENCOUNTER — Inpatient Hospital Stay: Payer: Medicare Other | Admitting: Physician Assistant

## 2020-07-10 VITALS — BP 137/76 | HR 63 | Temp 98.1°F | Resp 18

## 2020-07-10 VITALS — BP 122/61 | HR 69 | Temp 97.8°F | Resp 17 | Ht 68.0 in | Wt 176.5 lb

## 2020-07-10 DIAGNOSIS — C8251 Diffuse follicle center lymphoma, lymph nodes of head, face, and neck: Secondary | ICD-10-CM

## 2020-07-10 DIAGNOSIS — C8511 Unspecified B-cell lymphoma, lymph nodes of head, face, and neck: Secondary | ICD-10-CM | POA: Diagnosis not present

## 2020-07-10 LAB — CBC WITH DIFFERENTIAL (CANCER CENTER ONLY)
Abs Immature Granulocytes: 0.01 10*3/uL (ref 0.00–0.07)
Basophils Absolute: 0.1 10*3/uL (ref 0.0–0.1)
Basophils Relative: 1 %
Eosinophils Absolute: 0.3 10*3/uL (ref 0.0–0.5)
Eosinophils Relative: 6 %
HCT: 36.3 % — ABNORMAL LOW (ref 39.0–52.0)
Hemoglobin: 12.7 g/dL — ABNORMAL LOW (ref 13.0–17.0)
Immature Granulocytes: 0 %
Lymphocytes Relative: 9 %
Lymphs Abs: 0.5 10*3/uL — ABNORMAL LOW (ref 0.7–4.0)
MCH: 32.6 pg (ref 26.0–34.0)
MCHC: 35 g/dL (ref 30.0–36.0)
MCV: 93.1 fL (ref 80.0–100.0)
Monocytes Absolute: 0.4 10*3/uL (ref 0.1–1.0)
Monocytes Relative: 8 %
Neutro Abs: 3.8 10*3/uL (ref 1.7–7.7)
Neutrophils Relative %: 76 %
Platelet Count: 130 10*3/uL — ABNORMAL LOW (ref 150–400)
RBC: 3.9 MIL/uL — ABNORMAL LOW (ref 4.22–5.81)
RDW: 13 % (ref 11.5–15.5)
WBC Count: 5.1 10*3/uL (ref 4.0–10.5)
nRBC: 0 % (ref 0.0–0.2)

## 2020-07-10 LAB — CMP (CANCER CENTER ONLY)
ALT: 12 U/L (ref 0–44)
AST: 15 U/L (ref 15–41)
Albumin: 3.8 g/dL (ref 3.5–5.0)
Alkaline Phosphatase: 106 U/L (ref 38–126)
Anion gap: 10 (ref 5–15)
BUN: 16 mg/dL (ref 8–23)
CO2: 25 mmol/L (ref 22–32)
Calcium: 9 mg/dL (ref 8.9–10.3)
Chloride: 107 mmol/L (ref 98–111)
Creatinine: 0.75 mg/dL (ref 0.61–1.24)
GFR, Estimated: >60 mL/min (ref >60)
Glucose, Bld: 93 mg/dL (ref 70–99)
Potassium: 3.9 mmol/L (ref 3.5–5.1)
Sodium: 142 mmol/L (ref 135–145)
Total Bilirubin: 0.3 mg/dL (ref 0.3–1.2)
Total Protein: 7.5 g/dL (ref 6.5–8.1)

## 2020-07-10 LAB — LACTATE DEHYDROGENASE: LDH: 201 U/L — ABNORMAL HIGH (ref 98–192)

## 2020-07-10 LAB — URIC ACID: Uric Acid, Serum: 4.4 mg/dL (ref 3.7–8.6)

## 2020-07-10 MED ORDER — SODIUM CHLORIDE 0.9 % IV SOLN
Freq: Once | INTRAVENOUS | Status: AC
Start: 2020-07-10 — End: 2020-07-10
  Filled 2020-07-10: qty 250

## 2020-07-10 MED ORDER — DEXAMETHASONE SODIUM PHOSPHATE 100 MG/10ML IJ SOLN
10.0000 mg | Freq: Once | INTRAMUSCULAR | Status: AC
  Administered 2020-07-10: 10 mg via INTRAVENOUS
  Filled 2020-07-10: qty 10

## 2020-07-10 MED ORDER — SODIUM CHLORIDE 0.9 % IV SOLN
375.0000 mg/m2 | Freq: Once | INTRAVENOUS | Status: AC
  Administered 2020-07-10: 700 mg via INTRAVENOUS
  Filled 2020-07-10: qty 20

## 2020-07-10 MED ORDER — DIPHENHYDRAMINE HCL 25 MG PO CAPS
50.0000 mg | ORAL_CAPSULE | Freq: Once | ORAL | Status: AC
Start: 2020-07-10 — End: 2020-07-10
  Administered 2020-07-10: 50 mg via ORAL

## 2020-07-10 MED ORDER — PALONOSETRON HCL INJECTION 0.25 MG/5ML
0.2500 mg | Freq: Once | INTRAVENOUS | Status: AC
  Administered 2020-07-10: 0.25 mg via INTRAVENOUS

## 2020-07-10 MED ORDER — ACETAMINOPHEN 325 MG PO TABS
650.0000 mg | ORAL_TABLET | Freq: Once | ORAL | Status: AC
Start: 2020-07-10 — End: 2020-07-10
  Administered 2020-07-10: 650 mg via ORAL

## 2020-07-10 MED ORDER — PALONOSETRON HCL INJECTION 0.25 MG/5ML
INTRAVENOUS | Status: AC
  Filled 2020-07-10: qty 5

## 2020-07-10 MED ORDER — SODIUM CHLORIDE 0.9 % IV SOLN
90.0000 mg/m2 | Freq: Once | INTRAVENOUS | Status: AC
  Administered 2020-07-10: 175 mg via INTRAVENOUS
  Filled 2020-07-10: qty 7

## 2020-07-10 MED ORDER — ACETAMINOPHEN 325 MG PO TABS
ORAL_TABLET | ORAL | Status: AC
  Filled 2020-07-10: qty 2

## 2020-07-10 MED ORDER — RITUXIMAB-PVVR CHEMO 500 MG/50ML IV SOLN: 375.0000 mg/m2 | Freq: Once | INTRAVENOUS | Status: DC

## 2020-07-10 MED ORDER — DIPHENHYDRAMINE HCL 25 MG PO CAPS
ORAL_CAPSULE | ORAL | Status: AC
  Filled 2020-07-10: qty 2

## 2020-07-10 MED ORDER — SODIUM CHLORIDE 0.9 % IV SOLN
375.0000 mg/m2 | Freq: Once | INTRAVENOUS | Status: DC
  Filled 2020-07-10: qty 70

## 2020-07-10 NOTE — Progress Notes (Signed)
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 He is receiving R-Benda. Patient followed by Dr. Lorenso Courier  Past medical history of HLD, HTN  Met with patient during infusion. He reports good appetite and eating "too well" since moving into [facility]. He is eating 3 meals and occasionally snacks. Patient usually has scrambled eggs, toast, fruit cup, 1/2 banana, 2 cups of coffee with cream, and milk for breakfast, assorted sandwiches for lunch with fruit and salmon, shrimp, beef, pork, sometimes pizza for dinner, with starch, vegetable, and roll. He enjoys ice cream for dessert and occasionally snacks on assorted nuts. He drinks 6-7 glasses of water daily, occasionally has a glass of red wine. He likes milk chocolate flavor Ensure, but does not drink regularly. Patient denies nausea, vomiting, diarrhea, constipation. He reports decreased taste of foods and fatigue the 24-48 hours following treatments.   Medications: Ca 600+D, Decadron, Dilantin   Labs: reviewed   Anthropometrics: Weight 176.5 lbs today increased from 173.5 ln 4/15 and 170 lb on 4/12  Height: 5'8" Weight: 80.1 kg UBW: 171-178 lb (per pt) BMI: 26.84    NUTRITION DIAGNOSIS: Food and nutrition knowledge deficit related to cancer and associated treatments as evidenced by no prior need for nutrition related information   INTERVENTION:  Educated on the importance of maintaining nutrition/weights during treatment Encouraged adding high protein snacks in between meals, handout provided Discussed drinking high calorie, high protein supplement with poor appetite Ensure coupons provided RD contact information given    MONITORING, EVALUATION, GOAL: weight trends, intake   Next Visit: Wednesday May 25 in infusion

## 2020-07-10 NOTE — Progress Notes (Signed)
Rapid Infusion Rituximab Pharmacist Evaluation  Brian Castillo is a 80 y.o. male being treated with rituximab for lymphoma. This patient may be considered for RIR.   A pharmacist has verified the patient tolerated rituximab infusions per the [facility] standard infusion protocol without grade 3-4 infusion reactions. The treatment plan will be updated to reflect RIR if the patient qualifies per the checklist below:   Age > 4 years old Yes   Clinically significant cardiovascular disease No   Circulating lymphocyte count < 5000/uL prior to cycle two Yes  Lab Results  Component Value Date   LYMPHSABS 0.5 (L) 07/10/2020    Prior documented grade 3-4 infusion reaction to rituximab No   Prior documented grade 1-2 infusion reaction to rituximab (If YES, Pharmacist will confirm with Physician if patient is still a candidate for RIR) No   Previous rituximab infusion within the past 6 months Yes   Treatment Plan updated orders to reflect RIR Yes    Brian Castillo does meet the criteria for Rapid Infusion Rituximab. This patient is going to be switched to rapid infusion rituximab.    Kennith Center, Pharm.D., CPP 07/10/2020@12 :22 PM

## 2020-07-10 NOTE — Progress Notes (Signed)
Per Dr. Irene Limbo, keep as std rate for Cycle 2.  Pt had fever 2 weeks after cycle 1 BR, unknown origin. We will re-eval and consider rapid Ruxience w/ Cycle 3 if tolerated.  Kennith Center, Pharm.D., CPP 07/10/2020@12 :53 PM

## 2020-07-10 NOTE — Progress Notes (Signed)
[facility] [phone]   [phone]  PROGRESS NOTE  Patient Care Team: Shon Baton, MD as PCP - General (Internal Medicine)  Hematological/Oncological History #Follicular lymphoma Stage III 1) 01/30/2020: CT neck shows numerous enlarged lymph nodes, especially in the right submandibular space - which is the palpable abnormality. 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 Lorenso Courier  4) 02/22/2020: PET CT scan showed Deauville 4 and Deauville 5 adenopathy bilaterally in the neck. There is also a Deauville 4 left supraclavicular lymph node and a Deauville 4 right gastric lymph node 5) 04/24/2020: presents with rapid enlargement of his right submandibular lymph node.  6) 05/20/2020: partial excision of right submandibular lymph node with attempted fluid drainage. Pathology consistent with follicular lymphoma, no clear evidence of transformation.  7) 06/12/2020: Cycle 1 Day 1 of R-Benda. 8) 07/10/2020: Cycle 2 Day 1 of R-Benda.  Interval History:  Brian Castillo 80 y.o. 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 The patient's last visit was on 06/28/2020 after starting treatment with rituximab and bendamustine on 06/12/20.  On exam today Brian Castillo continues to do well and has tolerated the treatment. He reports improvement of energy levels and he continues to complete his ADLs on his own. He notes continued decrease in the size of his right sided cervical lymph node.  He denies any nausea, vomiting or abdominal pain. He has regular bowel movements without any hematochezia or melena. He denies any fevers, chills, night sweats, chest pain, shortness of breath or cough. None of his other lymph nodes appear enlarged at this time. He has no other complaints. A full 10 point ROS is listed below.  MEDICAL HISTORY:  Past Medical  History:  Diagnosis Date  . Allergy    mild  . Arthritis   . Cataract    forming  . Colon polyps   . GERD (gastroesophageal reflux disease)    15 -20 years ago -none currently  . Glaucoma   . Hyperlipidemia   . Hypertension   . 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 Seizures ([place])    last seizure was in 1988    SURGICAL HISTORY: Past Surgical History:  Procedure Laterality Date  . CARPOMETACARPEL SUSPENSION PLASTY Right 08/24/2014   Procedure: RIGHT CMC ARTHOPLASTY WITH DOUBLE TENDON TRANSFER AND REPAIR  RECONSTRUCTION ;  Surgeon: Roseanne Kaufman, MD;  Location: [facility];  Service: Orthopedics;  Laterality: Right;  . CHOLECYSTECTOMY    . COLONOSCOPY    . KNEE ARTHROSCOPY  2003   bilateral  . POLYPECTOMY      SOCIAL HISTORY: Social History   Socioeconomic History  . Marital status: Married    Spouse name: Not on file  . Number of children: Not on file  . Years of education: Not on file  . Highest education level: Not on file  Occupational History  . Not on file  Tobacco Use  . Smoking status: Former Research scientist (life sciences)  . Smokeless tobacco: Never Used  . Tobacco comment: quit 1984  Substance and Sexual Activity  . Alcohol use: Yes    Alcohol/week: 14.0 standard drinks    Types: 14 Glasses of wine per week  . Drug use: No  . Sexual activity: Not on file  Other Topics Concern  . Not on file  Social History Narrative  . Not on file  Social Determinants of Health   Financial Resource Strain: Not on file  Food Insecurity: Not on file  Transportation Needs: Not on file  Physical Activity: Not on file  Stress: Not on file  Social Connections: Not on file  Intimate Partner Violence: Not on file    FAMILY HISTORY: Family History  Problem Relation Age of Onset  . Colon cancer Neg Hx   . Colon polyps Neg Hx     ALLERGIES:  is allergic to brimonidine tartrate-timolol, lisinopril, and netarsudil dimesylate.  MEDICATIONS:  Current Outpatient  Medications  Medication Sig Dispense Refill  . acyclovir (ZOVIRAX) 400 MG tablet Take 1 tablet (400 mg total) by mouth 2 (two) times daily. 60 tablet 5  . allopurinol (ZYLOPRIM) 300 MG tablet Take 1 tablet (300 mg total) by mouth daily. 90 tablet 1  . amLODipine (NORVASC) 5 MG tablet Take 5 mg by mouth Daily.     [name] aspirin 81 MG tablet Take 81 mg by mouth daily.    . Calcium Carb-Cholecalciferol (CALCIUM 600 + D PO) Take 1 tablet by mouth daily.    . cholecalciferol (VITAMIN D) 1000 units tablet Take 1,000 Units by mouth daily.    [name] dexamethasone (DECADRON) 4 MG tablet Take 2 tablets (8 mg total) by mouth as directed. Take 2 pills (8mg ) twice daily on Days 3 and 4 of treatment 8 tablet 5  . Latanoprostene Bunod (VYZULTA) 0.024 % SOLN Place 1 drop into both eyes at bedtime.    . metoprolol succinate (TOPROL-XL) 25 MG 24 hr tablet Take 25 mg by mouth Daily.     . ondansetron (ZOFRAN) 8 MG tablet Take 1 tablet (8 mg total) by mouth every 8 (eight) hours as needed for nausea or vomiting. 30 tablet 0  . phenytoin (DILANTIN) 100 MG ER capsule Take 200 mg by mouth 2 (two) times daily.    . pravastatin (PRAVACHOL) 80 MG tablet Take 80 mg by mouth Daily.     . prochlorperazine (COMPAZINE) 10 MG tablet Take 1 tablet (10 mg total) by mouth every 6 (six) hours as needed for nausea or vomiting. 30 tablet 0  . timolol (BETIMOL) 0.5 % ophthalmic solution Place 1 drop into both eyes 2 (two) times daily.     No current facility-administered medications for this visit.    REVIEW OF SYSTEMS:   Constitutional: ( - ) fevers, ( - )  chills , ( - ) night sweats Eyes: ( - ) blurriness of vision, ( - ) double vision, ( - ) watery eyes Ears, nose, mouth, throat, and face: ( - ) mucositis, ( - ) sore throat Respiratory: ( - ) cough, ( - ) dyspnea, ( - ) wheezes Cardiovascular: ( - ) palpitation, ( - ) chest discomfort, ( - ) lower extremity swelling Gastrointestinal:  ( - ) nausea, ( - ) heartburn, ( - ) change in  bowel habits Skin: ( - ) abnormal skin rashes Lymphatics: ( - ) new lymphadenopathy, ( - ) easy bruising Neurological: ( - ) numbness, ( - ) tingling, ( - ) new weaknesses Behavioral/Psych: ( - ) mood change, ( - ) new changes  All other systems were reviewed with the patient and are negative.  PHYSICAL EXAMINATION: ECOG PERFORMANCE STATUS: 1 - Symptomatic but completely ambulatory  Vitals:   07/10/20 1020  BP: 122/61  Pulse: 69  Resp: 17  Temp: 97.8 F (36.6 C)  SpO2: 99%   Filed Weights   07/10/20 1020  Weight: 176  lb 8 oz (80.1 kg)    GENERAL: well appearing elderly Caucasian male in NAD  SKIN: skin color, texture, turgor are normal, no rashes or significant lesions EYES: conjunctiva are pink and non-injected, sclera clear NECK: supple, non-tender LYMPH:  Single palpable right sided submandibular lymph node. Otherwise no other marked palpable lymphadenopathy in the cervical, axillary or supraclavicular lymph nodes.  LUNGS: clear to auscultation and percussion with normal breathing effort HEART: regular rate & rhythm and no murmurs and no lower extremity edema Musculoskeletal: no cyanosis of digits and no clubbing  PSYCH: alert & oriented x 3, fluent speech NEURO: no focal motor/sensory deficits  LABORATORY DATA:  I have reviewed the data as listed CBC Latest Ref Rng & Units 07/10/2020 07/03/2020 06/28/2020  WBC 4.0 - 10.5 K/uL 5.1 5.6 7.6  Hemoglobin 13.0 - 17.0 g/dL 12.7(L) 12.9(L) 12.9(L)  Hematocrit 39.0 - 52.0 % 36.3(L) 37.4(L) 36.7(L)  Platelets 150 - 400 K/uL 130(L) 189 153    CMP Latest Ref Rng & Units 07/10/2020 07/03/2020 06/28/2020  Glucose 70 - 99 mg/dL 93 106(H) 134(H)  BUN 8 - 23 mg/dL 16 17 16   Creatinine 0.61 - 1.24 mg/dL 0.75 0.83 0.82  Sodium 135 - 145 mmol/L 142 141 139  Potassium 3.5 - 5.1 mmol/L 3.9 4.6 3.9  Chloride 98 - 111 mmol/L 107 107 103  CO2 22 - 32 mmol/L 25 25 24   Calcium 8.9 - 10.3 mg/dL 9.0 9.4 8.9  Total Protein 6.5 - 8.1 g/dL 7.5 7.8  7.7  Total Bilirubin 0.3 - 1.2 mg/dL 0.3 0.3 0.3  Alkaline Phos 38 - 126 U/L 106 108 107  AST 15 - 41 U/L 15 22 15   ALT 0 - 44 U/L 12 23 13    RADIOGRAPHIC STUDIES: I have personally reviewed the radiological images as listed and agreed with the findings in the report: stable lymphadenopathy with exception of the rapidly enlarging submandibular lymph node.   DG Chest Port 1 View  Result Date: 06/25/2020 CLINICAL DATA:  Fever EXAM: PORTABLE CHEST 1 VIEW COMPARISON:  None. FINDINGS: The heart size and mediastinal contours are within normal limits. Aortic atherosclerosis. Both lungs are clear. The visualized skeletal structures are unremarkable. IMPRESSION: No active disease. Electronically Signed   By: Donavan Foil M.D.   On: 06/25/2020 18:31    ASSESSMENT & PLAN Benney J Choinski 80 y.o. 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 The patient's last visit was on 02/12/2020 at which time he establish care.  After review the labs, the records, discussion with the patient the findings are most consistent with a stage III follicular lymphoma based on the PET CT scan.  There are lymph nodes on both sides of diaphragm and therefore stage III would be the most appropriate designation.    Previously we discussed the nature of follicular lymphoma and the treatment options moving forward.  The options at this time would be observation, immunotherapy, or combination of chemotherapy and immunotherapy.  Given the lack of symptoms, cytopenias, and bulky disease at the time of presentation we decided to proceed with observation alone. Unfortunately due to rapid progression we need to consider treatment moving forward.  Additionally we discussed rituximab monotherapy with weekly dosing x4 doses followed by every 2 months dosing x4 as a treatment option as well as bendamustine and rituximab on days 1 and 2 of a 28-day cycle x6.  We discussed the side  effects, risks and benefits, and possible outcomes  of treatment.  The patient voiced his understanding of these options and wished to proceed with R-Benda.   Over the last month the patient was found to have a rapidly enlarging right submandibular lymph node.  Due to concern for this enlarging lymph node we ordered a CT scan of the chest abdomen pelvis to assess for progression elsewhere, but that was the only lymph node that was progressing. Biopsy with ENT showed no progression into a DLBCL or other aggressive variant.  #Follicular lymphoma, Stage III --prior PET CT findings are consistent with a Stage III follicular lymphoma. Due to rapidly enlarging submandibular lymph node will proceed with treatment.  --imaging shows stable disease save for a single enlarging submandibular lymph node.  Biopsy has not shown transformation into a more aggressive lymphoma.  --after discussion with the patient, he is agreeable to starting Bendamustine/Rituximab as first line treatment.  --started Cycle 1 Day 1 of R-Benda chemotherapy on 06/12/2020 --Due for Cycle 2 Day of R-Benda. Labs were reviewed without any intervention. Patient will proceed with treatment as planned.  --RTC in 4 weeks for Cycle 3 with interval weekly lab visits.  #Medication Interaction --Allopurinol increases the levels of Dilantin. --We will work with the patient's primary care provider Dr. Shon Baton to monitor his Dilantin levels and adjust accordingly. For now we are following this weekly. Today's level is pending.   #Fever, resolved --Single episode of fever up to 101 F occurred on 06/25/2020. --Patient was not neutropenic at that time and was evaluated emergency department with infectious work-up including chest x-ray, blood cultures, and urine culture.  Date at that time showed no evidence of infectious disease. --No further fevers noted since that time.  We will continue to monitor.  #Supportive Care --chemotherapy education  completed --zofran 8mg  q8H PRN and compazine 10mg  PO q6H for nausea --acyclovir 400mg  PO BID for VCZ prophylaxis -- allopurinol 300mg  PO daily for TLS prophylaxis -- no port required for this regimen.  -- no pain medication required at this time.    No orders of the defined types were placed in this encounter.  All questions were answered. The patient knows to call the clinic with any problems, questions or concerns.  A total of more than 30 minutes were spent on this encounter and over half of that time was spent on counseling and coordination of care as outlined above.   Dede Query PA-C Department of Hematology/Oncology [place] at [facility] Phone: [phone]  07/10/2020 10:59 AM   Patient was seen with Dr. Irene Limbo.    ADDENDUM  .Patient was Personally and independently interviewed, examined and relevant elements of the history of present illness were reviewed in details and an assessment and plan was created. All elements of the patient's history of present illness , assessment and plan were discussed in details with Dede Query PA-C. The above documentation reflects our combined findings assessment and plan. No prohibitive toxicities from C1 of BR. Non infectious fever ? Tumor lysis vs alloupurinol. Would hold off on Rapid Rituxan this cycle. If no reactions or fevers can proceed to rapid Rituxan with C3. Patient questions answered in details. Allopurinol discontinued. Educated patient to f/u with PCP/neurology to monitor dilantin levels -which could drop off allopurinol and dose might need to be adjusted.  Sullivan Lone MD MS

## 2020-07-10 NOTE — Patient Instructions (Signed)
[facility]   Discharge Instructions: Thank you for choosing [facility] to provide your oncology and hematology care.   If you have a lab appointment with the [facility], please go directly to the [facility] and check in at the registration area.   Wear comfortable clothing and clothing appropriate for easy access to any Portacath or PICC line.   We strive to give you quality time with your provider. You may need to reschedule your appointment if you arrive late (15 or more minutes).  Arriving late affects you and other patients whose appointments are after yours.  Also, if you miss three or more appointments without notifying the office, you may be dismissed from the clinic at the provider's discretion.      For prescription refill requests, have your pharmacy contact our office and allow 72 hours for refills to be completed.    Today you received the following chemotherapy and/or immunotherapy agents: Rituximab (Ruxience) and Bendamustine (Bendeka)   To help prevent nausea and vomiting after your treatment, we encourage you to take your nausea medication as directed.  BELOW ARE SYMPTOMS THAT SHOULD BE REPORTED IMMEDIATELY: . *FEVER GREATER THAN 100.4 F (38 C) OR HIGHER . *CHILLS OR SWEATING . *NAUSEA AND VOMITING THAT IS NOT CONTROLLED WITH YOUR NAUSEA MEDICATION . *UNUSUAL SHORTNESS OF BREATH . *UNUSUAL BRUISING OR BLEEDING . *URINARY PROBLEMS (pain or burning when urinating, or frequent urination) . *BOWEL PROBLEMS (unusual diarrhea, constipation, pain near the anus) . TENDERNESS IN MOUTH AND THROAT WITH OR WITHOUT PRESENCE OF ULCERS (sore throat, sores in mouth, or a toothache) . UNUSUAL RASH, SWELLING OR PAIN  . UNUSUAL VAGINAL DISCHARGE OR ITCHING   Items with * indicate a potential emergency and should be followed up as soon as possible or go to the Emergency Department if any problems should occur.  Please show the  CHEMOTHERAPY ALERT CARD or IMMUNOTHERAPY ALERT CARD at check-in to the Emergency Department and triage nurse.  Should you have questions after your visit or need to cancel or reschedule your appointment, please contact [facility]  Dept: [phone]  and follow the prompts.  Office hours are 8:00 a.m. to 4:30 p.m. Monday - Friday. Please note that voicemails left after 4:00 p.m. may not be returned until the following business day.  We are closed weekends and major holidays. You have access to a nurse at all times for urgent questions. Please call the main number to the clinic Dept: [phone] and follow the prompts.   For any non-urgent questions, you may also contact your provider using MyChart. We now offer e-Visits for anyone 37 and older to request care online for non-urgent symptoms. For details visit [email or web address].   Also download the MyChart app! Go to the app store, search "MyChart", open the app, select [facility], and log in with your MyChart username and password.  Due to Covid, a mask is required upon entering the hospital/clinic. If you do not have a mask, one will be given to you upon arrival. For doctor visits, patients may have 1 support person aged 16 or older with them. For treatment visits, patients cannot have anyone with them due to current Covid guidelines and our immunocompromised population.

## 2020-07-11 ENCOUNTER — Inpatient Hospital Stay: Payer: Medicare Other

## 2020-07-11 ENCOUNTER — Other Ambulatory Visit: Payer: Self-pay

## 2020-07-11 VITALS — BP 123/54 | HR 59 | Temp 98.0°F | Resp 18

## 2020-07-11 DIAGNOSIS — C8251 Diffuse follicle center lymphoma, lymph nodes of head, face, and neck: Secondary | ICD-10-CM

## 2020-07-11 DIAGNOSIS — C8511 Unspecified B-cell lymphoma, lymph nodes of head, face, and neck: Secondary | ICD-10-CM | POA: Diagnosis not present

## 2020-07-11 LAB — PHENYTOIN LEVEL, FREE AND TOTAL
Phenytoin, Free: 0.9 ug/mL — ABNORMAL LOW (ref 1.0–2.0)
Phenytoin, Total: 11.1 ug/mL (ref 10.0–20.0)

## 2020-07-11 MED ORDER — SODIUM CHLORIDE 0.9 % IV SOLN
Freq: Once | INTRAVENOUS | Status: AC
  Filled 2020-07-11: qty 250

## 2020-07-11 MED ORDER — SODIUM CHLORIDE 0.9 % IV SOLN
90.0000 mg/m2 | Freq: Once | INTRAVENOUS | Status: AC
  Administered 2020-07-11: 175 mg via INTRAVENOUS
  Filled 2020-07-11: qty 7

## 2020-07-11 MED ORDER — SODIUM CHLORIDE 0.9 % IV SOLN
10.0000 mg | Freq: Once | INTRAVENOUS | Status: AC
Start: 2020-07-11 — End: 2020-07-11
  Administered 2020-07-11: 10 mg via INTRAVENOUS
  Filled 2020-07-11: qty 10

## 2020-07-11 NOTE — Patient Instructions (Signed)
[facility]  Discharge Instructions: Thank you for choosing [facility] to provide your oncology and hematology care.   If you have a lab appointment with the Cancer Center, please go directly to the Cancer Center and check in at the registration area.   Wear comfortable clothing and clothing appropriate for easy access to any Portacath or PICC line.   We strive to give you quality time with your provider. You may need to reschedule your appointment if you arrive late (15 or more minutes).  Arriving late affects you and other patients whose appointments are after yours.  Also, if you miss three or more appointments without notifying the office, you may be dismissed from the clinic at the provider's discretion.      For prescription refill requests, have your pharmacy contact our office and allow 72 hours for refills to be completed.    Today you received the following chemotherapy and/or immunotherapy agents: Bendeka   To help prevent nausea and vomiting after your treatment, we encourage you to take your nausea medication as directed.  BELOW ARE SYMPTOMS THAT SHOULD BE REPORTED IMMEDIATELY: *FEVER GREATER THAN 100.4 F (38 C) OR HIGHER *CHILLS OR SWEATING *NAUSEA AND VOMITING THAT IS NOT CONTROLLED WITH YOUR NAUSEA MEDICATION *UNUSUAL SHORTNESS OF BREATH *UNUSUAL BRUISING OR BLEEDING *URINARY PROBLEMS (pain or burning when urinating, or frequent urination) *BOWEL PROBLEMS (unusual diarrhea, constipation, pain near the anus) TENDERNESS IN MOUTH AND THROAT WITH OR WITHOUT PRESENCE OF ULCERS (sore throat, sores in mouth, or a toothache) UNUSUAL RASH, SWELLING OR PAIN  UNUSUAL VAGINAL DISCHARGE OR ITCHING   Items with * indicate a potential emergency and should be followed up as soon as possible or go to the Emergency Department if any problems should occur.  Please show the CHEMOTHERAPY ALERT CARD or IMMUNOTHERAPY ALERT CARD at check-in to the  Emergency Department and triage nurse.  Should you have questions after your visit or need to cancel or reschedule your appointment, please contact [facility]  Dept: [phone]  and follow the prompts.  Office hours are 8:00 a.m. to 4:30 p.m. Monday - Friday. Please note that voicemails left after 4:00 p.m. may not be returned until the following business day.  We are closed weekends and major holidays. You have access to a nurse at all times for urgent questions. Please call the main number to the clinic Dept: [phone] and follow the prompts.   For any non-urgent questions, you may also contact your provider using MyChart. We now offer e-Visits for anyone 18 and older to request care online for non-urgent symptoms. For details visit [email or web address].   Also download the MyChart app! Go to the app store, search "MyChart", open the app, select [facility], and log in with your MyChart username and password.  Due to Covid, a mask is required upon entering the hospital/clinic. If you do not have a mask, one will be given to you upon arrival. For doctor visits, patients may have 1 support person aged 18 or older with them. For treatment visits, patients cannot have anyone with them due to current Covid guidelines and our immunocompromised population.   

## 2020-07-12 ENCOUNTER — Telehealth: Payer: Self-pay | Admitting: *Deleted

## 2020-07-12 NOTE — Telephone Encounter (Signed)
[name] patient to see how he was doing after chemo yesterday. Spoke with him. He states he feels good today. Denies fever, chills  So far has tolerated 2nd day of chemo for this cycle.Brian Castillo He is aware to call with any fevers, chills, pain, N/V/D

## 2020-07-25 ENCOUNTER — Inpatient Hospital Stay: Payer: Medicare Other | Attending: Hematology and Oncology

## 2020-07-25 ENCOUNTER — Other Ambulatory Visit: Payer: Self-pay

## 2020-07-25 DIAGNOSIS — Z86010 Personal history of colonic polyps: Secondary | ICD-10-CM | POA: Diagnosis not present

## 2020-07-25 DIAGNOSIS — C8511 Unspecified B-cell lymphoma, lymph nodes of head, face, and neck: Secondary | ICD-10-CM | POA: Insufficient documentation

## 2020-07-25 DIAGNOSIS — K219 Gastro-esophageal reflux disease without esophagitis: Secondary | ICD-10-CM | POA: Diagnosis not present

## 2020-07-25 DIAGNOSIS — Z79899 Other long term (current) drug therapy: Secondary | ICD-10-CM | POA: Diagnosis not present

## 2020-07-25 DIAGNOSIS — E785 Hyperlipidemia, unspecified: Secondary | ICD-10-CM | POA: Diagnosis not present

## 2020-07-25 DIAGNOSIS — I10 Essential (primary) hypertension: Secondary | ICD-10-CM | POA: Insufficient documentation

## 2020-07-25 DIAGNOSIS — C8251 Diffuse follicle center lymphoma, lymph nodes of head, face, and neck: Secondary | ICD-10-CM

## 2020-07-25 DIAGNOSIS — M1990 Unspecified osteoarthritis, unspecified site: Secondary | ICD-10-CM | POA: Diagnosis not present

## 2020-07-25 DIAGNOSIS — Z5111 Encounter for antineoplastic chemotherapy: Secondary | ICD-10-CM | POA: Diagnosis not present

## 2020-07-25 DIAGNOSIS — Z87891 Personal history of nicotine dependence: Secondary | ICD-10-CM | POA: Diagnosis not present

## 2020-07-25 LAB — CBC WITH DIFFERENTIAL (CANCER CENTER ONLY)
Abs Immature Granulocytes: 0.02 10*3/uL (ref 0.00–0.07)
Basophils Absolute: 0 10*3/uL (ref 0.0–0.1)
Basophils Relative: 1 %
Eosinophils Absolute: 0.3 10*3/uL (ref 0.0–0.5)
Eosinophils Relative: 6 %
HCT: 33.1 % — ABNORMAL LOW (ref 39.0–52.0)
Hemoglobin: 11.6 g/dL — ABNORMAL LOW (ref 13.0–17.0)
Immature Granulocytes: 0 %
Lymphocytes Relative: 4 %
Lymphs Abs: 0.2 10*3/uL — ABNORMAL LOW (ref 0.7–4.0)
MCH: 33.1 pg (ref 26.0–34.0)
MCHC: 35 g/dL (ref 30.0–36.0)
MCV: 94.6 fL (ref 80.0–100.0)
Monocytes Absolute: 0.5 10*3/uL (ref 0.1–1.0)
Monocytes Relative: 10 %
Neutro Abs: 3.9 10*3/uL (ref 1.7–7.7)
Neutrophils Relative %: 79 %
Platelet Count: 192 10*3/uL (ref 150–400)
RBC: 3.5 MIL/uL — ABNORMAL LOW (ref 4.22–5.81)
RDW: 13.9 % (ref 11.5–15.5)
WBC Count: 5 10*3/uL (ref 4.0–10.5)
nRBC: 0 % (ref 0.0–0.2)

## 2020-07-25 LAB — URIC ACID: Uric Acid, Serum: 6.5 mg/dL (ref 3.7–8.6)

## 2020-07-25 LAB — CMP (CANCER CENTER ONLY)
ALT: 14 U/L (ref 0–44)
AST: 16 U/L (ref 15–41)
Albumin: 3.6 g/dL (ref 3.5–5.0)
Alkaline Phosphatase: 107 U/L (ref 38–126)
Anion gap: 8 (ref 5–15)
BUN: 16 mg/dL (ref 8–23)
CO2: 28 mmol/L (ref 22–32)
Calcium: 8.9 mg/dL (ref 8.9–10.3)
Chloride: 105 mmol/L (ref 98–111)
Creatinine: 0.81 mg/dL (ref 0.61–1.24)
GFR, Estimated: >60 mL/min (ref >60)
Glucose, Bld: 132 mg/dL — ABNORMAL HIGH (ref 70–99)
Potassium: 4.2 mmol/L (ref 3.5–5.1)
Sodium: 141 mmol/L (ref 135–145)
Total Bilirubin: 0.2 mg/dL — ABNORMAL LOW (ref 0.3–1.2)
Total Protein: 6.9 g/dL (ref 6.5–8.1)

## 2020-07-25 LAB — LACTATE DEHYDROGENASE: LDH: 171 U/L (ref 98–192)

## 2020-07-26 ENCOUNTER — Telehealth: Payer: Self-pay | Admitting: *Deleted

## 2020-07-26 LAB — PHENYTOIN LEVEL, FREE AND TOTAL
Phenytoin, Free: NOT DETECTED ug/mL (ref 1.0–2.0)
Phenytoin, Total: 7.3 ug/mL — ABNORMAL LOW (ref 10.0–20.0)

## 2020-07-26 NOTE — Telephone Encounter (Signed)
Received call from patient requesting his lab results. Reviewed lab results with him and advised that Dr. Lorenso Courier states he is doing well. Pt voiced understanding. He also says that he feels fatigued most of the time. Advised that fatigue is very common. Advised to pace himself, take rest breaks as needed. Encouraged him to get outs die as much as possible. He states he is not sleeping well, despite feeling so tired.  He used to sleep well at night.  Spoke with Dr. Lorenso Courier and he recommended pt start out with Melatonin 3 mg. Can use up to 6 mg 20 minutes before he goes to bed. Pt voiced understanding.

## 2020-07-30 ENCOUNTER — Telehealth: Payer: Self-pay | Admitting: Hematology and Oncology

## 2020-07-30 NOTE — Telephone Encounter (Signed)
Scheduled appts per 5/13 sch msg. Pt aware.  

## 2020-08-07 ENCOUNTER — Inpatient Hospital Stay: Payer: Medicare Other

## 2020-08-07 ENCOUNTER — Other Ambulatory Visit: Payer: Medicare Other

## 2020-08-07 ENCOUNTER — Inpatient Hospital Stay: Payer: Medicare Other | Admitting: Hematology and Oncology

## 2020-08-07 ENCOUNTER — Inpatient Hospital Stay: Payer: Medicare Other | Admitting: Dietician

## 2020-08-07 ENCOUNTER — Ambulatory Visit: Payer: Medicare Other | Admitting: Physician Assistant

## 2020-08-07 ENCOUNTER — Ambulatory Visit: Payer: Medicare Other

## 2020-08-07 ENCOUNTER — Other Ambulatory Visit: Payer: Self-pay

## 2020-08-07 VITALS — BP 121/56 | HR 59 | Temp 96.5°F | Resp 17 | Ht 68.0 in | Wt 175.4 lb

## 2020-08-07 VITALS — BP 124/53 | HR 58 | Temp 97.8°F | Resp 16

## 2020-08-07 DIAGNOSIS — C8251 Diffuse follicle center lymphoma, lymph nodes of head, face, and neck: Secondary | ICD-10-CM

## 2020-08-07 DIAGNOSIS — C8511 Unspecified B-cell lymphoma, lymph nodes of head, face, and neck: Secondary | ICD-10-CM | POA: Diagnosis not present

## 2020-08-07 LAB — CMP (CANCER CENTER ONLY)
ALT: 10 U/L (ref 0–44)
AST: 12 U/L — ABNORMAL LOW (ref 15–41)
Albumin: 3.7 g/dL (ref 3.5–5.0)
Alkaline Phosphatase: 110 U/L (ref 38–126)
Anion gap: 10 (ref 5–15)
BUN: 15 mg/dL (ref 8–23)
CO2: 25 mmol/L (ref 22–32)
Calcium: 9.2 mg/dL (ref 8.9–10.3)
Chloride: 106 mmol/L (ref 98–111)
Creatinine: 0.76 mg/dL (ref 0.61–1.24)
GFR, Estimated: >60 mL/min (ref >60)
Glucose, Bld: 98 mg/dL (ref 70–99)
Potassium: 4.2 mmol/L (ref 3.5–5.1)
Sodium: 141 mmol/L (ref 135–145)
Total Bilirubin: 0.3 mg/dL (ref 0.3–1.2)
Total Protein: 6.9 g/dL (ref 6.5–8.1)

## 2020-08-07 LAB — CBC WITH DIFFERENTIAL (CANCER CENTER ONLY)
Abs Immature Granulocytes: 0.02 10*3/uL (ref 0.00–0.07)
Basophils Absolute: 0.1 10*3/uL (ref 0.0–0.1)
Basophils Relative: 1 %
Eosinophils Absolute: 0.3 10*3/uL (ref 0.0–0.5)
Eosinophils Relative: 5 %
HCT: 33.2 % — ABNORMAL LOW (ref 39.0–52.0)
Hemoglobin: 12 g/dL — ABNORMAL LOW (ref 13.0–17.0)
Immature Granulocytes: 0 %
Lymphocytes Relative: 8 %
Lymphs Abs: 0.4 10*3/uL — ABNORMAL LOW (ref 0.7–4.0)
MCH: 33.1 pg (ref 26.0–34.0)
MCHC: 36.1 g/dL — ABNORMAL HIGH (ref 30.0–36.0)
MCV: 91.7 fL (ref 80.0–100.0)
Monocytes Absolute: 0.6 10*3/uL (ref 0.1–1.0)
Monocytes Relative: 11 %
Neutro Abs: 3.8 10*3/uL (ref 1.7–7.7)
Neutrophils Relative %: 75 %
Platelet Count: 160 10*3/uL (ref 150–400)
RBC: 3.62 MIL/uL — ABNORMAL LOW (ref 4.22–5.81)
RDW: 13.8 % (ref 11.5–15.5)
WBC Count: 5.1 10*3/uL (ref 4.0–10.5)
nRBC: 0 % (ref 0.0–0.2)

## 2020-08-07 LAB — LACTATE DEHYDROGENASE: LDH: 211 U/L — ABNORMAL HIGH (ref 98–192)

## 2020-08-07 LAB — URIC ACID: Uric Acid, Serum: 6.8 mg/dL (ref 3.7–8.6)

## 2020-08-07 MED ORDER — SODIUM CHLORIDE 0.9 % IV SOLN
90.0000 mg/m2 | Freq: Once | INTRAVENOUS | Status: AC
  Administered 2020-08-07: 175 mg via INTRAVENOUS
  Filled 2020-08-07: qty 7

## 2020-08-07 MED ORDER — ACETAMINOPHEN 325 MG PO TABS
650.0000 mg | ORAL_TABLET | Freq: Once | ORAL | Status: AC
  Administered 2020-08-07: 650 mg via ORAL

## 2020-08-07 MED ORDER — DIPHENHYDRAMINE HCL 25 MG PO CAPS
50.0000 mg | ORAL_CAPSULE | Freq: Once | ORAL | Status: AC
  Administered 2020-08-07: 50 mg via ORAL

## 2020-08-07 MED ORDER — ACETAMINOPHEN 325 MG PO TABS
ORAL_TABLET | ORAL | Status: AC
  Filled 2020-08-07: qty 2

## 2020-08-07 MED ORDER — SODIUM CHLORIDE 0.9 % IV SOLN
375.0000 mg/m2 | Freq: Once | INTRAVENOUS | Status: AC
  Administered 2020-08-07: 700 mg via INTRAVENOUS
  Filled 2020-08-07: qty 50

## 2020-08-07 MED ORDER — PALONOSETRON HCL INJECTION 0.25 MG/5ML
INTRAVENOUS | Status: AC
  Filled 2020-08-07: qty 5

## 2020-08-07 MED ORDER — SODIUM CHLORIDE 0.9 % IV SOLN
10.0000 mg | Freq: Once | INTRAVENOUS | Status: AC
  Administered 2020-08-07: 10 mg via INTRAVENOUS
  Filled 2020-08-07: qty 10

## 2020-08-07 MED ORDER — PALONOSETRON HCL INJECTION 0.25 MG/5ML
0.2500 mg | Freq: Once | INTRAVENOUS | Status: AC
  Administered 2020-08-07: 0.25 mg via INTRAVENOUS

## 2020-08-07 MED ORDER — DIPHENHYDRAMINE HCL 25 MG PO CAPS
ORAL_CAPSULE | ORAL | Status: AC
  Filled 2020-08-07: qty 2

## 2020-08-07 MED ORDER — SODIUM CHLORIDE 0.9 % IV SOLN
Freq: Once | INTRAVENOUS | Status: AC
  Filled 2020-08-07: qty 250

## 2020-08-07 NOTE — Progress Notes (Signed)
Nutrition Follow-up:  Patient with stage III follicular lymphoma. He is receiving R-Benda.   Met with patient in infusion. He reports good appetite and is eating well, denies nutrition impact symptoms. Patient reports an increase in size of right cervical lymph node over the past week. He denies impact to chewing or swallowing. Yesterday patient had 1/2 banana, 4 strawberries, toast, croissant, coffee, milk for breakfast, chicken quesadilla with tomato and bacon, tea for lunch, 1 slice of caprese pizza for dinner. Patient will occasionally eat snacks between meals, enjoys nuts and fresh strawberries. Patient had scrambled eggs this morning, he is getting tired of eating well done eggs and would like to have them over easy. Patient is not drinking nutrition supplement. He drinks 4-5 glasses of water each day. Patient usually drinks tea with lunch, sometimes dinner, drinks milk and coffee daily with breakfast.    Medications: reviewed  Labs: reviewed  Anthropometrics: Weight 175 lb 6.4 oz today stable   4/27 - 176.5 lb 4/15 - 173.5 lb 4/12 - 170 lb   NUTRITION DIAGNOSIS: Food and nutrition related knowledge deficit continues   INTERVENTION:  Reviewed importance of adequate nutrition to maintain strength/weights Reviewed food sources of protein Encouraged high calorie, high protein snacks in between meals, pt has handout Discussed food safety  Encouraged drinking Ensure Enlive or equivalent daily for added calories and protein Patient politely declined Ensure coupons today   MONITORING, EVALUATION, GOAL: weight trends, intake   NEXT VISIT: To be scheduled with treatment

## 2020-08-07 NOTE — Progress Notes (Signed)
[facility] [phone]   [phone]  PROGRESS NOTE  Patient Care Team: Shon Baton, MD as PCP - General (Internal Medicine)  Hematological/Oncological History #Follicular lymphoma Stage III 1) 01/30/2020: CT neck shows numerous enlarged lymph nodes, especially in the right submandibular space - which is the palpable abnormality. 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 Lorenso Courier  4) 02/22/2020: PET CT scan showed Deauville 4 and Deauville 5 adenopathy bilaterally in the neck. There is also a Deauville 4 left supraclavicular lymph node and a Deauville 4 right gastric lymph node 5) 04/24/2020: presents with rapid enlargement of his right submandibular lymph node.  6) 05/20/2020: partial excision of right submandibular lymph node with attempted fluid drainage. Pathology consistent with follicular lymphoma, no clear evidence of transformation.  7) 06/12/2020: Cycle 1 Day 1 of R-Benda. 8) 07/10/2020: Cycle 2 Day 1 of R-Benda. 9) 08/07/2020: Cycle 3 Day 1 of R-Benda. Noted to have enlargement of right cervical lymph node on exam.   Interval History:  Brian Castillo 80 y.o. 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 The patient's last visit was on 07/10/2020 with Dede Query. In the interim since the last visit he has completed Cycle 2 of treatment.  On exam today Brian Castillo unfortunately has had an increase in the size of his right cervical lymph node over the last week.  He notes that it did feel considerably smaller prior to last week but now it is enlarged and tender.  Reports that he has been tolerating chemotherapy otherwise well with no major side effects.  His weight has been stable.  He denies any nausea, vomiting or abdominal pain. He has regular bowel movements without any hematochezia or melena. He denies any fevers,  chills, night sweats, chest pain, shortness of breath or cough. None of his other lymph nodes appear enlarged at this time. He has no other complaints. A full 10 point ROS is listed below.  MEDICAL HISTORY:  Past Medical History:  Diagnosis Date  . Allergy    mild  . Arthritis   . Cataract    forming  . Colon polyps   . GERD (gastroesophageal reflux disease)    15 -20 years ago -none currently  . Glaucoma   . Hyperlipidemia   . Hypertension   . 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 Seizures ([place])    last seizure was in 1988    SURGICAL HISTORY: Past Surgical History:  Procedure Laterality Date  . CARPOMETACARPEL SUSPENSION PLASTY Right 08/24/2014   Procedure: RIGHT CMC ARTHOPLASTY WITH DOUBLE TENDON TRANSFER AND REPAIR  RECONSTRUCTION ;  Surgeon: Roseanne Kaufman, MD;  Location: [facility];  Service: Orthopedics;  Laterality: Right;  . CHOLECYSTECTOMY    . COLONOSCOPY    . KNEE ARTHROSCOPY  2003   bilateral  . POLYPECTOMY      SOCIAL HISTORY: Social History   Socioeconomic History  . Marital status: Married    Spouse name: Not on file  . Number of children: Not on file  . Years of education: Not on file  . Highest education level: Not on file  Occupational History  . Not on file  Tobacco Use  . Smoking status: Former Research scientist (life sciences)  . Smokeless tobacco: Never Used  . Tobacco comment: quit 1984  Substance and Sexual Activity  . Alcohol use: Yes  Alcohol/week: 14.0 standard drinks    Types: 14 Glasses of wine per week  . Drug use: No  . Sexual activity: Not on file  Other Topics Concern  . Not on file  Social History Narrative  . Not on file   Social Determinants of Health   Financial Resource Strain: Not on file  Food Insecurity: Not on file  Transportation Needs: Not on file  Physical Activity: Not on file  Stress: Not on file  Social Connections: Not on file  Intimate Partner Violence: Not on file    FAMILY HISTORY: Family  History  Problem Relation Age of Onset  . Colon cancer Neg Hx   . Colon polyps Neg Hx     ALLERGIES:  is allergic to brimonidine tartrate-timolol, lisinopril, and netarsudil dimesylate.  MEDICATIONS:  Current Outpatient Medications  Medication Sig Dispense Refill  . acyclovir (ZOVIRAX) 400 MG tablet Take 1 tablet (400 mg total) by mouth 2 (two) times daily. 60 tablet 5  . amLODipine (NORVASC) 5 MG tablet Take 5 mg by mouth Daily.     [name] aspirin 81 MG tablet Take 81 mg by mouth daily.    . Calcium Carb-Cholecalciferol (CALCIUM 600 + D PO) Take 1 tablet by mouth daily.    . cholecalciferol (VITAMIN D) 1000 units tablet Take 1,000 Units by mouth daily.    [name] dexamethasone (DECADRON) 4 MG tablet Take 2 tablets (8 mg total) by mouth as directed. Take 2 pills (8mg ) twice daily on Days 3 and 4 of treatment 8 tablet 5  . Latanoprostene Bunod (VYZULTA) 0.024 % SOLN Place 1 drop into both eyes at bedtime.    . metoprolol succinate (TOPROL-XL) 25 MG 24 hr tablet Take 25 mg by mouth Daily.     . ondansetron (ZOFRAN) 8 MG tablet Take 1 tablet (8 mg total) by mouth every 8 (eight) hours as needed for nausea or vomiting. 30 tablet 0  . phenytoin (DILANTIN) 100 MG ER capsule Take 200 mg by mouth 2 (two) times daily.    . pravastatin (PRAVACHOL) 80 MG tablet Take 80 mg by mouth Daily.     . prochlorperazine (COMPAZINE) 10 MG tablet Take 1 tablet (10 mg total) by mouth every 6 (six) hours as needed for nausea or vomiting. 30 tablet 0  . timolol (BETIMOL) 0.5 % ophthalmic solution Place 1 drop into both eyes 2 (two) times daily.     No current facility-administered medications for this visit.    REVIEW OF SYSTEMS:   Constitutional: ( - ) fevers, ( - )  chills , ( - ) night sweats Eyes: ( - ) blurriness of vision, ( - ) double vision, ( - ) watery eyes Ears, nose, mouth, throat, and face: ( - ) mucositis, ( - ) sore throat Respiratory: ( - ) cough, ( - ) dyspnea, ( - ) wheezes Cardiovascular: ( - )  palpitation, ( - ) chest discomfort, ( - ) lower extremity swelling Gastrointestinal:  ( - ) nausea, ( - ) heartburn, ( - ) change in bowel habits Skin: ( - ) abnormal skin rashes Lymphatics: ( - ) new lymphadenopathy, ( - ) easy bruising Neurological: ( - ) numbness, ( - ) tingling, ( - ) new weaknesses Behavioral/Psych: ( - ) mood change, ( - ) new changes  All other systems were reviewed with the patient and are negative.  PHYSICAL EXAMINATION: ECOG PERFORMANCE STATUS: 1 - Symptomatic but completely ambulatory  Vitals:   08/07/20 1059  BP: [name])  121/56  Pulse: (!) 59  Resp: 17  Temp: (!) 96.5 F (35.8 C)  SpO2: 99%   Filed Weights   08/07/20 1059  Weight: 175 lb 6.4 oz (79.6 kg)    GENERAL: well appearing elderly Caucasian male in NAD  SKIN: skin color, texture, turgor are normal, no rashes or significant lesions EYES: conjunctiva are pink and non-injected, sclera clear NECK: supple, non-tender LYMPH:  Single palpable right sided submandibular lymph node. Otherwise no other marked palpable lymphadenopathy in the cervical, axillary or supraclavicular lymph nodes.  LUNGS: clear to auscultation and percussion with normal breathing effort HEART: regular rate & rhythm and no murmurs and no lower extremity edema Musculoskeletal: no cyanosis of digits and no clubbing  PSYCH: alert & oriented x 3, fluent speech NEURO: no focal motor/sensory deficits  LABORATORY DATA:  I have reviewed the data as listed CBC Latest Ref Rng & Units 08/07/2020 07/25/2020 07/10/2020  WBC 4.0 - 10.5 K/uL 5.1 5.0 5.1  Hemoglobin 13.0 - 17.0 g/dL 12.0(L) 11.6(L) 12.7(L)  Hematocrit 39.0 - 52.0 % 33.2(L) 33.1(L) 36.3(L)  Platelets 150 - 400 K/uL 160 192 130(L)    CMP Latest Ref Rng & Units 08/07/2020 07/25/2020 07/10/2020  Glucose 70 - 99 mg/dL 98 132(H) 93  BUN 8 - 23 mg/dL 15 16 16   Creatinine 0.61 - 1.24 mg/dL 0.76 0.81 0.75  Sodium 135 - 145 mmol/L 141 141 142  Potassium 3.5 - 5.1 mmol/L 4.2 4.2 3.9   Chloride 98 - 111 mmol/L 106 105 107  CO2 22 - 32 mmol/L 25 28 25   Calcium 8.9 - 10.3 mg/dL 9.2 8.9 9.0  Total Protein 6.5 - 8.1 g/dL 6.9 6.9 7.5  Total Bilirubin 0.3 - 1.2 mg/dL 0.3 0.2(L) 0.3  Alkaline Phos 38 - 126 U/L 110 107 106  AST 15 - 41 U/L 12(L) 16 15  ALT 0 - 44 U/L 10 14 12    RADIOGRAPHIC STUDIES: I have personally reviewed the radiological images as listed and agreed with the findings in the report: stable lymphadenopathy with exception of the rapidly enlarging submandibular lymph node.   No results found.  ASSESSMENT & PLAN Brian Castillo 80 y.o. 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 The patient's last visit was on 02/12/2020 at which time he establish care.  After review the labs, the records, discussion with the patient the findings are most consistent with a stage III follicular lymphoma based on the PET CT scan.  There are lymph nodes on both sides of diaphragm and therefore stage III would be the most appropriate designation.    Previously we discussed the nature of follicular lymphoma and the treatment options moving forward.  The options at this time would be observation, immunotherapy, or combination of chemotherapy and immunotherapy.  Given the lack of symptoms, cytopenias, and bulky disease at the time of presentation we decided to proceed with observation alone. Unfortunately due to rapid progression we need to consider treatment moving forward.  Additionally we discussed rituximab monotherapy with weekly dosing x4 doses followed by every 2 months dosing x4 as a treatment option as well as bendamustine and rituximab on days 1 and 2 of a 28-day cycle x6.  We discussed the side effects, risks and benefits, and possible outcomes of treatment.  The patient voiced his understanding of these options and wished to proceed with R-Benda.   Over the period of one month the patient was found to have a rapidly  enlarging right submandibular lymph node.  Due to concern for this enlarging lymph node we ordered a CT scan of the chest abdomen pelvis to assess for progression elsewhere, but that was the only lymph node that was progressing. Biopsy with ENT showed no progression into a DLBCL or other aggressive variant.  #Follicular lymphoma, Stage III --right cervical lymph node is now enlarging. Consistent with progression. Will order new baseline PET CT scan and prepare patient for 2nd line therapy (planning R-miniCHOP, which would also be effective if patient is having transformation of the tumor). Proceed with Cycle 3 today to give [place] time to prepare for next chemotherapy regimen.  -- patient will require PET, Port, and TTE prior to start of R-miniCHOP. --prior PET CT findings are consistent with a Stage III follicular lymphoma. Due to rapidly enlarging submandibular lymph node we proceeded with treatment.  --imaging shows stable disease save for a single enlarging submandibular lymph node.  Biopsy has not shown transformation into a more aggressive lymphoma.  --after discussion with the patient, he is agreeable to starting Bendamustine/Rituximab as first line treatment.  --started Cycle 1 Day 1 of R-Benda chemotherapy on 06/12/2020 --Due for Cycle 3 Day of R-Benda. Labs were reviewed without any intervention. Patient will proceed with treatment as planned.  --RTC in 3-4 weeks to start R-miniCHOP  #Medication Interaction --Allopurinol increases the levels of Dilantin. --We will work with the patient's primary care provider Dr. Shon Baton to monitor his Dilantin levels and adjust accordingly. For now we are following this weekly. Today's level is pending.   #Fever, resolved --Single episode of fever up to 101 F occurred on 06/25/2020. --Patient was not neutropenic at that time and was evaluated emergency department with infectious work-up including chest x-ray, blood cultures, and urine culture.  Data at  that time showed no evidence of infectious disease. --No further fevers noted since that time.  We will continue to monitor.  #Supportive Care --chemotherapy education completed --zofran 8mg  q8H PRN and compazine 10mg  PO q6H for nausea --acyclovir 400mg  PO BID for VCZ prophylaxis -- allopurinol 300mg  PO daily for TLS prophylaxis -- port ordered for 2nd line treatment -- no pain medication required at this time.    Orders Placed This Encounter  Procedures  . IR IMAGING GUIDED PORT INSERTION    Standing Status:   Future    Standing Expiration Date:   08/07/2021    Order Specific Question:   Reason for Exam (SYMPTOM  OR DIAGNOSIS REQUIRED)    Answer:   needed for R-CHOP chemotherapy    Order Specific Question:   Preferred Imaging Location?    Answer:   [facility] (PS) Skull Base To Thigh    Standing Status:   Future    Standing Expiration Date:   08/07/2021    Order Specific Question:   If indicated for the ordered procedure, I authorize the administration of a radiopharmaceutical per Radiology protocol    Answer:   Yes    Order Specific Question:   Preferred imaging location?    Answer:   [facility]  . ECHOCARDIOGRAM COMPLETE    Standing Status:   Future    Standing Expiration Date:   08/07/2021    Scheduling Instructions:     Please schedule ASAP, needed prior to chemotherapy    Order Specific Question:   Where should this test be performed    Answer:   [facility]    Order Specific Question:  Perflutren DEFINITY (image enhancing agent) should be administered unless hypersensitivity or allergy exist    Answer:   Administer Perflutren    Order Specific Question:   Is a special reader required? (athlete or structural heart)    Answer:   No    Order Specific Question:   Does this study need to be read by the Structural team/Level 3 readers?    Answer:   No    Order Specific Question:   Reason for exam-Echo    Answer:   Chemo  Z09   All questions  were answered. The patient knows to call the clinic with any problems, questions or concerns.  A total of more than 30 minutes were spent on this encounter and over half of that time was spent on counseling and coordination of care as outlined above.   Ledell Peoples, MD Department of Hematology/Oncology [facility] Phone: [phone] Pager: [phone] Email: [email or web address]  08/07/2020 12:05 PM

## 2020-08-07 NOTE — Patient Instructions (Signed)
[facility]   Discharge Instructions: Thank you for choosing [facility] to provide your oncology and hematology care.   If you have a lab appointment with the [facility], please go directly to the [facility] and check in at the registration area.   Wear comfortable clothing and clothing appropriate for easy access to any Portacath or PICC line.   We strive to give you quality time with your provider. You may need to reschedule your appointment if you arrive late (15 or more minutes).  Arriving late affects you and other patients whose appointments are after yours.  Also, if you miss three or more appointments without notifying the office, you may be dismissed from the clinic at the provider's discretion.      For prescription refill requests, have your pharmacy contact our office and allow 72 hours for refills to be completed.    Today you received the following chemotherapy and/or immunotherapy agents: Rituximab (Ruxience) and Bendamustine (Bendeka)   To help prevent nausea and vomiting after your treatment, we encourage you to take your nausea medication as directed.  BELOW ARE SYMPTOMS THAT SHOULD BE REPORTED IMMEDIATELY: . *FEVER GREATER THAN 100.4 F (38 C) OR HIGHER . *CHILLS OR SWEATING . *NAUSEA AND VOMITING THAT IS NOT CONTROLLED WITH YOUR NAUSEA MEDICATION . *UNUSUAL SHORTNESS OF BREATH . *UNUSUAL BRUISING OR BLEEDING . *URINARY PROBLEMS (pain or burning when urinating, or frequent urination) . *BOWEL PROBLEMS (unusual diarrhea, constipation, pain near the anus) . TENDERNESS IN MOUTH AND THROAT WITH OR WITHOUT PRESENCE OF ULCERS (sore throat, sores in mouth, or a toothache) . UNUSUAL RASH, SWELLING OR PAIN  . UNUSUAL VAGINAL DISCHARGE OR ITCHING   Items with * indicate a potential emergency and should be followed up as soon as possible or go to the Emergency Department if any problems should occur.  Please show the  CHEMOTHERAPY ALERT CARD or IMMUNOTHERAPY ALERT CARD at check-in to the Emergency Department and triage nurse.  Should you have questions after your visit or need to cancel or reschedule your appointment, please contact [facility]  Dept: [phone]  and follow the prompts.  Office hours are 8:00 a.m. to 4:30 p.m. Monday - Friday. Please note that voicemails left after 4:00 p.m. may not be returned until the following business day.  We are closed weekends and major holidays. You have access to a nurse at all times for urgent questions. Please call the main number to the clinic Dept: [phone] and follow the prompts.   For any non-urgent questions, you may also contact your provider using MyChart. We now offer e-Visits for anyone 37 and older to request care online for non-urgent symptoms. For details visit [email or web address].   Also download the MyChart app! Go to the app store, search "MyChart", open the app, select [facility], and log in with your MyChart username and password.  Due to Covid, a mask is required upon entering the hospital/clinic. If you do not have a mask, one will be given to you upon arrival. For doctor visits, patients may have 1 support person aged 16 or older with them. For treatment visits, patients cannot have anyone with them due to current Covid guidelines and our immunocompromised population.

## 2020-08-08 ENCOUNTER — Telehealth: Payer: Self-pay | Admitting: *Deleted

## 2020-08-08 ENCOUNTER — Inpatient Hospital Stay: Payer: Medicare Other

## 2020-08-08 VITALS — BP 111/58 | HR 58 | Temp 98.0°F | Resp 16

## 2020-08-08 DIAGNOSIS — C8511 Unspecified B-cell lymphoma, lymph nodes of head, face, and neck: Secondary | ICD-10-CM | POA: Diagnosis not present

## 2020-08-08 DIAGNOSIS — C8251 Diffuse follicle center lymphoma, lymph nodes of head, face, and neck: Secondary | ICD-10-CM

## 2020-08-08 LAB — PHENYTOIN LEVEL, FREE AND TOTAL
Phenytoin, Free: 0.7 ug/mL — ABNORMAL LOW (ref 1.0–2.0)
Phenytoin, Total: 10.8 ug/mL (ref 10.0–20.0)

## 2020-08-08 MED ORDER — SODIUM CHLORIDE 0.9 % IV SOLN
10.0000 mg | Freq: Once | INTRAVENOUS | Status: AC
Start: 2020-08-08 — End: 2020-08-08
  Administered 2020-08-08: 10 mg via INTRAVENOUS
  Filled 2020-08-08: qty 10

## 2020-08-08 MED ORDER — SODIUM CHLORIDE 0.9 % IV SOLN
Freq: Once | INTRAVENOUS | Status: AC
Start: 2020-08-08 — End: 2020-08-08
  Filled 2020-08-08: qty 250

## 2020-08-08 MED ORDER — SODIUM CHLORIDE 0.9 % IV SOLN
90.0000 mg/m2 | Freq: Once | INTRAVENOUS | Status: AC
  Administered 2020-08-08: 175 mg via INTRAVENOUS
  Filled 2020-08-08: qty 7

## 2020-08-08 NOTE — Patient Instructions (Signed)
[facility]  Discharge Instructions: Thank you for choosing [facility] to provide your oncology and hematology care.   If you have a lab appointment with the [facility], please go directly to the [facility] and check in at the registration area.   Wear comfortable clothing and clothing appropriate for easy access to any Portacath or PICC line.   We strive to give you quality time with your provider. You may need to reschedule your appointment if you arrive late (15 or more minutes).  Arriving late affects you and other patients whose appointments are after yours.  Also, if you miss three or more appointments without notifying the office, you may be dismissed from the clinic at the provider's discretion.      For prescription refill requests, have your pharmacy contact our office and allow 72 hours for refills to be completed.    Today you received the following chemotherapy and/or immunotherapy agent: Bendamustine Verl Dicker)   To help prevent nausea and vomiting after your treatment, we encourage you to take your nausea medication as directed.  BELOW ARE SYMPTOMS THAT SHOULD BE REPORTED IMMEDIATELY: . *FEVER GREATER THAN 100.4 F (38 C) OR HIGHER . *CHILLS OR SWEATING . *NAUSEA AND VOMITING THAT IS NOT CONTROLLED WITH YOUR NAUSEA MEDICATION . *UNUSUAL SHORTNESS OF BREATH . *UNUSUAL BRUISING OR BLEEDING . *URINARY PROBLEMS (pain or burning when urinating, or frequent urination) . *BOWEL PROBLEMS (unusual diarrhea, constipation, pain near the anus) . TENDERNESS IN MOUTH AND THROAT WITH OR WITHOUT PRESENCE OF ULCERS (sore throat, sores in mouth, or a toothache) . UNUSUAL RASH, SWELLING OR PAIN  . UNUSUAL VAGINAL DISCHARGE OR ITCHING   Items with * indicate a potential emergency and should be followed up as soon as possible or go to the Emergency Department if any problems should occur.  Please show the CHEMOTHERAPY ALERT CARD or  IMMUNOTHERAPY ALERT CARD at check-in to the Emergency Department and triage nurse.  Should you have questions after your visit or need to cancel or reschedule your appointment, please contact [facility]  Dept: [phone]  and follow the prompts.  Office hours are 8:00 a.m. to 4:30 p.m. Monday - Friday. Please note that voicemails left after 4:00 p.m. may not be returned until the following business day.  We are closed weekends and major holidays. You have access to a nurse at all times for urgent questions. Please call the main number to the clinic Dept: [phone] and follow the prompts.   For any non-urgent questions, you may also contact your provider using MyChart. We now offer e-Visits for anyone 51 and older to request care online for non-urgent symptoms. For details visit [email or web address].   Also download the MyChart app! Go to the app store, search "MyChart", open the app, select [facility], and log in with your MyChart username and password.  Due to Covid, a mask is required upon entering the hospital/clinic. If you do not have a mask, one will be given to you upon arrival. For doctor visits, patients may have 1 support person aged 84 or older with them. For treatment visits, patients cannot have anyone with them due to current Covid guidelines and our immunocompromised population.

## 2020-08-08 NOTE — Telephone Encounter (Signed)
[name] patient regarding upcoming appts.  Spoke with him and informed him that his cardiac echo is now scheduled for 08/13/20 @ 8am and then his port placement is scheduled for 08/14/20 @1pm . The only thing left to get scheduled is his PET scan. Advised that he should be hearing about that soon.  Pt voiced understanding

## 2020-08-09 ENCOUNTER — Telehealth: Payer: Self-pay | Admitting: Hematology and Oncology

## 2020-08-09 NOTE — Telephone Encounter (Signed)
Scheduled per los. Called and spoke with patient. Confirmed appt 

## 2020-08-13 ENCOUNTER — Ambulatory Visit (HOSPITAL_COMMUNITY)
Admission: RE | Admit: 2020-08-13 | Discharge: 2020-08-13 | Disposition: A | Discharge status: Home / Self Care | Payer: Medicare Other | Source: Ambulatory Visit | Attending: Hematology and Oncology | Admitting: Hematology and Oncology

## 2020-08-13 ENCOUNTER — Other Ambulatory Visit: Payer: Self-pay | Admitting: Radiology

## 2020-08-13 ENCOUNTER — Other Ambulatory Visit: Payer: Self-pay

## 2020-08-13 DIAGNOSIS — Z0189 Encounter for other specified special examinations: Secondary | ICD-10-CM | POA: Diagnosis not present

## 2020-08-13 DIAGNOSIS — C8251 Diffuse follicle center lymphoma, lymph nodes of head, face, and neck: Secondary | ICD-10-CM | POA: Diagnosis present

## 2020-08-13 DIAGNOSIS — Z888 Allergy status to other drugs, medicaments and biological substances status: Secondary | ICD-10-CM | POA: Insufficient documentation

## 2020-08-13 DIAGNOSIS — Z7982 Long term (current) use of aspirin: Secondary | ICD-10-CM | POA: Insufficient documentation

## 2020-08-13 DIAGNOSIS — Z79899 Other long term (current) drug therapy: Secondary | ICD-10-CM | POA: Diagnosis not present

## 2020-08-13 LAB — ECHOCARDIOGRAM COMPLETE
Area-P 1/2: 2.8 cm2
S' Lateral: 3 cm

## 2020-08-13 NOTE — Progress Notes (Signed)
  Echocardiogram 2D Echocardiogram has been performed.  Brian Castillo 08/13/2020, 11:52 AM

## 2020-08-14 ENCOUNTER — Encounter (HOSPITAL_COMMUNITY): Payer: Self-pay

## 2020-08-14 ENCOUNTER — Ambulatory Visit (HOSPITAL_COMMUNITY)
Admission: RE | Admit: 2020-08-14 | Discharge: 2020-08-14 | Disposition: A | Discharge status: Home / Self Care | Payer: Medicare Other | Source: Ambulatory Visit | Attending: Hematology and Oncology | Admitting: Hematology and Oncology

## 2020-08-14 DIAGNOSIS — C8251 Diffuse follicle center lymphoma, lymph nodes of head, face, and neck: Secondary | ICD-10-CM | POA: Diagnosis not present

## 2020-08-14 HISTORY — PX: IR IMAGING GUIDED PORT INSERTION: IMG5740

## 2020-08-14 LAB — CBC WITH DIFFERENTIAL/PLATELET
Abs Immature Granulocytes: 0.05 10*3/uL (ref 0.00–0.07)
Basophils Absolute: 0 10*3/uL (ref 0.0–0.1)
Basophils Relative: 1 %
Eosinophils Absolute: 0.3 10*3/uL (ref 0.0–0.5)
Eosinophils Relative: 5 %
HCT: 36 % — ABNORMAL LOW (ref 39.0–52.0)
Hemoglobin: 12.5 g/dL — ABNORMAL LOW (ref 13.0–17.0)
Immature Granulocytes: 1 %
Lymphocytes Relative: 2 %
Lymphs Abs: 0.1 10*3/uL — ABNORMAL LOW (ref 0.7–4.0)
MCH: 33.4 pg (ref 26.0–34.0)
MCHC: 34.7 g/dL (ref 30.0–36.0)
MCV: 96.3 fL (ref 80.0–100.0)
Monocytes Absolute: 0.6 10*3/uL (ref 0.1–1.0)
Monocytes Relative: 9 %
Neutro Abs: 5.3 10*3/uL (ref 1.7–7.7)
Neutrophils Relative %: 82 %
Platelets: 147 10*3/uL — ABNORMAL LOW (ref 150–400)
RBC: 3.74 MIL/uL — ABNORMAL LOW (ref 4.22–5.81)
RDW: 14.3 % (ref 11.5–15.5)
WBC: 6.4 10*3/uL (ref 4.0–10.5)
nRBC: 0 % (ref 0.0–0.2)

## 2020-08-14 LAB — PROTIME-INR
INR: 1 (ref 0.8–1.2)
Prothrombin Time: 12.9 seconds (ref 11.4–15.2)

## 2020-08-14 MED ORDER — MIDAZOLAM HCL 2 MG/2ML IJ SOLN
INTRAMUSCULAR | Status: AC
  Filled 2020-08-14: qty 2

## 2020-08-14 MED ORDER — LIDOCAINE-EPINEPHRINE 1 %-1:100000 IJ SOLN
INTRAMUSCULAR | Status: AC
  Filled 2020-08-14: qty 1

## 2020-08-14 MED ORDER — MIDAZOLAM HCL 2 MG/2ML IJ SOLN
INTRAMUSCULAR | Status: AC | PRN
  Administered 2020-08-14: 1 mg via INTRAVENOUS
  Administered 2020-08-14 (×2): 0.5 mg via INTRAVENOUS

## 2020-08-14 MED ORDER — LIDOCAINE-EPINEPHRINE 1 %-1:100000 IJ SOLN
INTRAMUSCULAR | Status: AC | PRN
  Administered 2020-08-14 (×2): 10 mL via INTRADERMAL

## 2020-08-14 MED ORDER — SODIUM CHLORIDE 0.9 % IV SOLN: INTRAVENOUS | Status: DC

## 2020-08-14 MED ORDER — HEPARIN SOD (PORK) LOCK FLUSH 100 UNIT/ML IV SOLN
INTRAVENOUS | Status: AC
  Filled 2020-08-14: qty 5

## 2020-08-14 MED ORDER — FENTANYL CITRATE (PF) 100 MCG/2ML IJ SOLN
INTRAMUSCULAR | Status: AC | PRN
  Administered 2020-08-14 (×3): 25 ug via INTRAVENOUS

## 2020-08-14 MED ORDER — FENTANYL CITRATE (PF) 100 MCG/2ML IJ SOLN
INTRAMUSCULAR | Status: AC
  Filled 2020-08-14: qty 2

## 2020-08-14 MED ORDER — HEPARIN SOD (PORK) LOCK FLUSH 100 UNIT/ML IV SOLN
INTRAVENOUS | Status: AC | PRN
  Administered 2020-08-14: 500 [IU] via INTRAVENOUS

## 2020-08-14 NOTE — H&P (Signed)
Referring Physician(s): Dorsey,John T IV  Supervising Physician: Ruthann Cancer  Patient Status:  [facility]  Chief Complaint:  "I'm getting a port a cath"  Subjective: Patient familiar to IR service from right submandibular lymph node biopsy on 02/06/2020.  He has a history of follicular lymphoma diagnosed in 2021 and has undergone chemo/immunotherapy.  He now presents with an enlarging right cervical lymph node consistent with disease progression and is scheduled today for Port-A-Cath placement to assist with treatment.  He currently denies fever, headache, chest pain, dyspnea, cough, abdominal/back pain, nausea, vomiting or bleeding.  He does have some right neck tenderness secondary to adenopathy.   Past Medical History:  Diagnosis Date  . Allergy    mild  . Arthritis   . Cataract    forming  . Colon polyps   . GERD (gastroesophageal reflux disease)    15 -20 years ago -none currently  . Glaucoma   . Hyperlipidemia   . Hypertension   . 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 Seizures ([place])    last seizure was in 1988   Past Surgical History:  Procedure Laterality Date  . CARPOMETACARPEL SUSPENSION PLASTY Right 08/24/2014   Procedure: RIGHT CMC ARTHOPLASTY WITH DOUBLE TENDON TRANSFER AND REPAIR  RECONSTRUCTION ;  Surgeon: Roseanne Kaufman, MD;  Location: [facility];  Service: Orthopedics;  Laterality: Right;  . CHOLECYSTECTOMY    . COLONOSCOPY    . KNEE ARTHROSCOPY  2003   bilateral  . POLYPECTOMY         Allergies: Brimonidine tartrate-timolol, Lisinopril, and Netarsudil dimesylate  Medications: Prior to Admission medications   Medication Sig Start Date End Date Taking? Authorizing Provider  acyclovir (ZOVIRAX) 400 MG tablet Take 1 tablet (400 mg total) by mouth 2 (two) times daily. 05/29/20  Yes Orson Slick, MD  amLODipine (NORVASC) 5 MG tablet Take 5 mg by mouth Daily.  11/28/11  Yes [provider]  Calcium  Carb-Cholecalciferol (CALCIUM 600 + D PO) Take 1 tablet by mouth daily.   Yes [provider]  cholecalciferol (VITAMIN D) 1000 units tablet Take 1,000 Units by mouth daily.   Yes [provider]  dexamethasone (DECADRON) 4 MG tablet Take 2 tablets (8 mg total) by mouth as directed. Take 2 pills (8mg ) twice daily on Days 3 and 4 of treatment 05/29/20  Yes Orson Slick, MD  Latanoprostene Bunod (VYZULTA) 0.024 % SOLN Place 1 drop into both eyes at bedtime.   Yes [provider]  metoprolol succinate (TOPROL-XL) 25 MG 24 hr tablet Take 25 mg by mouth Daily.  11/28/11  Yes [provider]  phenytoin (DILANTIN) 100 MG ER capsule Take 200 mg by mouth 2 (two) times daily.   Yes [provider]  pravastatin (PRAVACHOL) 80 MG tablet Take 80 mg by mouth Daily.  11/28/11  Yes [provider]  timolol (BETIMOL) 0.5 % ophthalmic solution Place 1 drop into both eyes 2 (two) times daily.   Yes [provider]  aspirin 81 MG tablet Take 81 mg by mouth daily.    [provider]  ondansetron (ZOFRAN) 8 MG tablet Take 1 tablet (8 mg total) by mouth every 8 (eight) hours as needed for nausea or vomiting. 05/29/20   Orson Slick, MD  prochlorperazine (COMPAZINE) 10 MG tablet Take 1 tablet (10 mg total) by mouth every 6 (six) hours as needed for nausea or vomiting. 05/29/20  Orson Slick, MD     Vital Signs:pending    Physical Exam awake, alert.  Chest clear to auscultation bilaterally.  Heart with regular rate and rhythm.  Abdomen soft, positive bowel sounds, nontender.  Trace pretibial edema bilaterally.  Prominent right neck/submandibular adenopathy, sl tender to palpation  Imaging: ECHOCARDIOGRAM COMPLETE  Result Date: 08/13/2020    ECHOCARDIOGRAM REPORT   Patient Name:   Brian Castillo Date of Exam: 08/13/2020 Medical Rec #:  [id number]       Height:       68.0 in Accession #:    [id number]      Weight:       175.4 lb Date of  Birth:  [birth year]        BSA:          1.933 m Patient Age:    80 years        BP:           141/57 mmHg Patient Gender: M               HR:           64 bpm. Exam Location:  Outpatient Procedure: 2D Echo, Cardiac Doppler, Color Doppler and Strain Analysis Indications:    Chemo Z09  History:        Patient has no prior history of Echocardiogram examinations.                 Risk Factors:Hypertension and Dyslipidemia.  Sonographer:    Bernadene Person RDCS Referring Phys: [id number] [place]  1. Left ventricular ejection fraction, by estimation, is 60 to 65%. The left ventricle has normal function. The left ventricle has no regional wall motion abnormalities. Left ventricular diastolic parameters are indeterminate.  2. Right ventricular systolic function is normal. The right ventricular size is normal.  3. The mitral valve is normal in structure. No evidence of mitral valve regurgitation.  4. The aortic valve is grossly normal. There is mild calcification of the aortic valve. Aortic valve regurgitation is not visualized. No aortic stenosis is present. FINDINGS  Left Ventricle: Left ventricular ejection fraction, by estimation, is 60 to 65%. The left ventricle has normal function. The left ventricle has no regional wall motion abnormalities. The left ventricular internal cavity size was normal in size. There is  no left ventricular hypertrophy. Left ventricular diastolic parameters are indeterminate. Right Ventricle: The right ventricular size is normal. Right vetricular wall thickness was not well visualized. Right ventricular systolic function is normal. Left Atrium: Left atrial size was normal in size. Right Atrium: Right atrial size was normal in size. Pericardium: There is no evidence of pericardial effusion. Mitral Valve: The mitral valve is normal in structure. No evidence of mitral valve regurgitation. Tricuspid Valve: The tricuspid valve is grossly normal. Tricuspid valve regurgitation is  trivial. Aortic Valve: The aortic valve is grossly normal. There is mild calcification of the aortic valve. Aortic valve regurgitation is not visualized. No aortic stenosis is present. Pulmonic Valve: The pulmonic valve was normal in structure. Pulmonic valve regurgitation is trivial. Aorta: The aortic root and ascending aorta are structurally normal, with no evidence of dilitation. IAS/Shunts: The atrial septum is grossly normal.  LEFT VENTRICLE PLAX 2D LVIDd:         5.00 cm  Diastology LVIDs:         3.00 cm  LV e' medial:    5.15 cm/s LV PW:  0.90 cm  LV E/e' medial:  14.1 LV IVS:        0.90 cm  LV e' lateral:   5.63 cm/s LVOT diam:     2.10 cm  LV E/e' lateral: 12.9 LV SV:         79 LV SV Index:   41       2D Longitudinal Strain LVOT Area:     3.46 cm 2D Strain GLS Avg:     -24.6 %  RIGHT VENTRICLE TAPSE (M-mode): 2.3 cm LEFT ATRIUM             Index       RIGHT ATRIUM           Index LA diam:        4.60 cm 2.38 cm/m  RA Area:     13.20 cm LA Vol (A2C):   47.2 ml 24.42 ml/m RA Volume:   27.00 ml  13.97 ml/m LA Vol (A4C):   54.0 ml 27.94 ml/m LA Biplane Vol: 52.1 ml 26.95 ml/m  AORTIC VALVE LVOT Vmax:   113.00 cm/s LVOT Vmean:  70.600 cm/s LVOT VTI:    0.227 m  AORTA Ao Root diam: 3.70 cm Ao Asc diam:  3.30 cm MITRAL VALVE MV Area (PHT): 2.80 cm    SHUNTS MV Decel Time: 271 msec    Systemic VTI:  0.23 m MV E velocity: 72.40 cm/s  Systemic Diam: 2.10 cm MV A velocity: 74.10 cm/s MV E/A ratio:  0.98 Mertie Moores MD Electronically signed by Mertie Moores MD Signature Date/Time: 08/13/2020/6:06:16 PM    Final     Labs:  CBC: Recent Labs    07/03/20 1607 07/10/20 0952 07/25/20 1607 08/07/20 1036  WBC 5.6 5.1 5.0 5.1  HGB 12.9* 12.7* 11.6* 12.0*  HCT 37.4* 36.3* 33.1* 33.2*  PLT 189 130* 192 160    COAGS: Recent Labs    02/06/20 1115  INR 1.0    BMP: Recent Labs    07/03/20 1607 07/10/20 0952 07/25/20 1607 08/07/20 1036  NA 141 142 141 141  K 4.6 3.9 4.2 4.2  CL 107  107 105 106  CO2 25 25 28 25   GLUCOSE 106* 93 132* 98  BUN 17 16 16 15   CALCIUM 9.4 9.0 8.9 9.2  CREATININE 0.83 0.75 0.81 0.76  GFRNONAA >60 >60 >60 >60    LIVER FUNCTION TESTS: Recent Labs    07/03/20 1607 07/10/20 0952 07/25/20 1607 08/07/20 1036  BILITOT 0.3 0.3 0.2* 0.3  AST 22 15 16  12*  ALT 23 12 14 10   ALKPHOS 108 106 107 110  PROT 7.8 7.5 6.9 6.9  ALBUMIN 3.8 3.8 3.6 3.7    Assessment and Plan: Patient familiar to IR service from right submandibular lymph node biopsy on 02/06/2020.  He has a history of follicular lymphoma diagnosed in 2021 and has undergone chemo/immunotherapy.  He now presents with an enlarging right cervical lymph node consistent with disease progression and is scheduled today for Port-A-Cath placement to assist with treatment.Risks and benefits of image guided port-a-catheter placement was discussed with the patient including, but not limited to bleeding, infection, pneumothorax, or fibrin sheath development and need for additional procedures.  All of the patient's questions were answered, patient is agreeable to proceed. Consent signed and in chart.     Electronically Signed: D. Rowe Robert, PA-C 08/14/2020, 1:42 PM   I spent a total of 25 minutes at the the patient's bedside AND on the patient's hospital  floor or unit, greater than 50% of which was counseling/coordinating care for Port-A-Cath placement

## 2020-08-14 NOTE — Procedures (Signed)
Interventional Radiology Procedure Note ° °Procedure: Single Lumen Power Port Placement   ° °Access:  Right internal jugular vein ° °Findings: Catheter tip positioned at cavoatrial junction. Port is ready for immediate use.  ° °Complications: None ° °EBL: < 10 mL ° °Recommendations:  °- Ok to shower in 24 hours °- Do not submerge for 7 days °- Routine line care  ° ° °Tywana Robotham, MD ° ° ° °

## 2020-08-14 NOTE — Discharge Instructions (Addendum)
Interventional radiology phone numbers [phone] After hours [phone]    You have skin glue (dermabond) over your new port. Do not use the lidocaine cream (EMLA cream) over the skin glue until it has healed. The petroleum in the lidocaine cream will dissolve the skin glue resulting in an infection of your new port. Use ice in a zip lock bag for 1-2 minutes over your new port before the cancer center nurses access your port.   Implanted Port Insertion, Care After This sheet gives you information about how to care for yourself after your procedure. Your health care provider may also give you more specific instructions. If you have problems or questions, contact your health care provider. What can I expect after the procedure? After the procedure, it is common to have:  Discomfort at the port insertion site.  Bruising on the skin over the port. This should improve over 3-4 days. Follow these instructions at home: Port care  After your port is placed, you will get a manufacturer's information card. The card has information about your port. Keep this card with you at all times.  Take care of the port as told by your health care provider. Ask your health care provider if you or a family member can get training for taking care of the port at home. A home health care nurse may also take care of the port.  Make sure to remember what type of port you have. Incision care 1. Follow instructions from your health care provider about how to take care of your port insertion site. Make sure you: ? Wash your hands with soap and water before and after you change your bandage (dressing). If soap and water are not available, use hand sanitizer. ? Change your dressing as told by your health care provider. 2. Leave skin glue in place. These skin closures may need to stay in place for 2 weeks or longer.  3. Check your port insertion site every day for signs of infection. Check for: ? Redness, swelling,  or pain. ? Fluid or blood. ? Warmth. ? Pus or a bad smell.      Activity  Return to your normal activities as told by your health care provider. Ask your health care provider what activities are safe for you.  Do not lift anything that is heavier than 10 lb (4.5 kg), or the limit that you are told, until your health care provider says that it is safe. General instructions  Take over-the-counter and prescription medicines only as told by your health care provider.  Do not take baths, swim, or use a hot tub until your health care provider approves.You may remove your dressing tomorrow and shower 24 hours after your procedure.  Do not drive for 24 hours if you were given a sedative during your procedure.  Wear a medical alert bracelet in case of an emergency. This will tell any health care providers that you have a port.  Keep all follow-up visits as told by your health care provider. This is important. Contact a health care provider if:  You cannot flush your port with saline as directed, or you cannot draw blood from the port.  You have a fever or chills.  You have redness, swelling, or pain around your port insertion site.  You have fluid or blood coming from your port insertion site.  Your port insertion site feels warm to the touch.  You have pus or a bad smell coming from the port insertion site.   Get help right away if:  You have chest pain or shortness of breath.  You have bleeding from your port that you cannot control. Summary  Take care of the port as told by your health care provider. Keep the manufacturer's information card with you at all times.  Change your dressing as told by your health care provider.  Contact a health care provider if you have a fever or chills or if you have redness, swelling, or pain around your port insertion site.  Keep all follow-up visits as told by your health care provider. This information is not intended to replace advice given  to you by your health care provider. Make sure you discuss any questions you have with your health care provider. Document Revised: 09/28/2017 Document Reviewed: 09/28/2017 Elsevier Patient Education  2021 Elsevier Inc.    Moderate Conscious Sedation, Adult, Care After This sheet gives you information about how to care for yourself after your procedure. Your health care provider may also give you more specific instructions. If you have problems or questions, contact your health care provider. What can I expect after the procedure? After the procedure, it is common to have:  Sleepiness for several hours.  Impaired judgment for several hours.  Difficulty with balance.  Vomiting if you eat too soon. Follow these instructions at home: For the time period you were told by your health care provider:  Rest.  Do not participate in activities where you could fall or become injured.  Do not drive or use machinery.  Do not drink alcohol.  Do not take sleeping pills or medicines that cause drowsiness.  Do not make important decisions or sign legal documents.  Do not take care of children on your own.      Eating and drinking 4. Follow the diet recommended by your health care provider. 5. Drink enough fluid to keep your urine pale yellow. 6. If you vomit: ? Drink water, juice, or soup when you can drink without vomiting. ? Make sure you have little or no nausea before eating solid foods.   General instructions  Take over-the-counter and prescription medicines only as told by your health care provider.  Have a responsible adult stay with you for the time you are told. It is important to have someone help care for you until you are awake and alert.  Do not smoke.  Keep all follow-up visits as told by your health care provider. This is important. Contact a health care provider if:  You are still sleepy or having trouble with balance after 24 hours.  You feel  light-headed.  You keep feeling nauseous or you keep vomiting.  You develop a rash.  You have a fever.  You have redness or swelling around the IV site. Get help right away if:  You have trouble breathing.  You have new-onset confusion at home. Summary  After the procedure, it is common to feel sleepy, have impaired judgment, or feel nauseous if you eat too soon.  Rest after you get home. Know the things you should not do after the procedure.  Follow the diet recommended by your health care provider and drink enough fluid to keep your urine pale yellow.  Get help right away if you have trouble breathing or new-onset confusion at home. This information is not intended to replace advice given to you by your health care provider. Make sure you discuss any questions you have with your health care provider. Document Revised: 06/30/2019 Document Reviewed: 01/26/2019   Elsevier Patient Education  2021 Elsevier Inc. 

## 2020-08-16 ENCOUNTER — Other Ambulatory Visit (HOSPITAL_BASED_OUTPATIENT_CLINIC_OR_DEPARTMENT_OTHER): Payer: Self-pay

## 2020-08-16 ENCOUNTER — Encounter: Payer: Self-pay | Admitting: Hematology and Oncology

## 2020-08-16 ENCOUNTER — Ambulatory Visit: Payer: Medicare Other | Attending: Internal Medicine

## 2020-08-16 ENCOUNTER — Other Ambulatory Visit: Payer: Self-pay

## 2020-08-16 DIAGNOSIS — Z23 Encounter for immunization: Secondary | ICD-10-CM

## 2020-08-16 MED ORDER — PFIZER-BIONT COVID-19 VAC-TRIS 30 MCG/0.3ML IM SUSP
INTRAMUSCULAR | 0 refills | Status: DC
  Filled 2020-08-16: qty 0.3, 1d supply, fill #0

## 2020-08-16 NOTE — Progress Notes (Signed)
   Covid-19 Vaccination Clinic  Name:  Brian Castillo    MRN: [id number] DOB: 09-03-1940  08/16/2020  [name] was observed post Covid-19 immunization for 15 minutes without incident. He was provided with Vaccine Information Sheet and instruction to access the V-Safe system.   [name] was instructed to call 911 with any severe reactions post vaccine: [name] Difficulty breathing  . Swelling of face and throat  . A fast heartbeat  . A bad rash all over body  . Dizziness and weakness   Immunizations Administered    Name Date Dose VIS Date Route   PFIZER Comrnaty(Gray TOP) Covid-19 Vaccine 08/16/2020 10:25 AM 0.3 mL 02/22/2020 Intramuscular   Manufacturer: [employer]   Lot: [id number]   [place]: [phone]

## 2020-08-19 ENCOUNTER — Telehealth: Payer: Self-pay | Admitting: *Deleted

## 2020-08-19 ENCOUNTER — Other Ambulatory Visit: Payer: Self-pay | Admitting: Interventional Radiology

## 2020-08-19 ENCOUNTER — Telehealth: Payer: Self-pay | Admitting: Student

## 2020-08-19 DIAGNOSIS — C8251 Diffuse follicle center lymphoma, lymph nodes of head, face, and neck: Secondary | ICD-10-CM

## 2020-08-19 NOTE — Telephone Encounter (Signed)
Patient called with complaint of pain shooting to right ear, worsening/progressing neck tightness with limited ROM since Port-A-Cath placement on Wednesday.  States pain and stiffness worsening through the weekend.  Cannot turn his neck side-to-side today.  Also got a COVID booster on Friday afternoon.  No erythema, swelling, fever, purulence, drainage.  Dressing c/d per patient report.   Spoke with Dr. Serafina Royals.  No concerns related to procedure.  Pain may be related to lymphadenopathy/mass, however evaluation warranted.   Recommended appointment tomorrow for examination.  Patient agreeable.   In the meantime, to continue conservative measures including ibuprofen, warm compresses. Patient states he also has a prescription for Robaxin that he takes PRN and will try this as well.  He plans to be at appointment tomorrow at [facility].   Brynda Greathouse, MS RD PA-C 12:10 PM

## 2020-08-19 NOTE — Telephone Encounter (Signed)
Received call from patient. He states he had his port placed on 08/14/20 and has been experiencing increased pain in his neck (right sire) ever siince and it has gotten worse over the weekend. He can only sleep sitting up, not in bed. He states his neck feels like the muscles are very tight. This is the same side as his very enlarged lymph node.  He has called radiology but has not heard back from them. Call made to [place] in IR. No answer but was able to leave vm message with pt details for her to contact pt ASAP. Pt aware that I also called. Advised that he call me back if he has not heard from them by early afternoon.  He states he will.  Dr. Lorenso Courier made aware

## 2020-08-20 ENCOUNTER — Encounter: Payer: Self-pay | Admitting: *Deleted

## 2020-08-20 ENCOUNTER — Ambulatory Visit
Admission: RE | Admit: 2020-08-20 | Discharge: 2020-08-20 | Disposition: A | Discharge status: Home / Self Care | Payer: Medicare Other | Source: Ambulatory Visit | Attending: Interventional Radiology | Admitting: Interventional Radiology

## 2020-08-20 DIAGNOSIS — C8251 Diffuse follicle center lymphoma, lymph nodes of head, face, and neck: Secondary | ICD-10-CM

## 2020-08-20 HISTORY — PX: IR RADIOLOGIST EVAL & MGMT: IMG5224

## 2020-08-20 NOTE — Progress Notes (Signed)
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 The right neck pain occurs only at night--- no pain during day. Pain can be severe--- keeping him form sleep Pain travels from neck to Rt ear. Has noticed pain has progressed to include left neck of recent. He has taken Tylenol without relief. Ibuprofen 400 mg every 6 hrs and even Robaxin one time yesterday--- has seemed to help. Also helps to sleep sitting more upright than flat. Comes in today for evaluation    Past Medical History:  Diagnosis Date  . Allergy    mild  . Arthritis   . Cataract    forming  . Colon polyps   . GERD (gastroesophageal reflux disease)    15 -20 years ago -none currently  . Glaucoma   . Hyperlipidemia   . Hypertension   . 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 Seizures ([place])    last seizure was in 1988    Past Surgical History:  Procedure Laterality Date  . CARPOMETACARPEL SUSPENSION PLASTY Right 08/24/2014   Procedure: RIGHT CMC ARTHOPLASTY WITH DOUBLE TENDON TRANSFER AND REPAIR  RECONSTRUCTION ;  Surgeon: Roseanne Kaufman, MD;  Location: [place];  Service: Orthopedics;  Laterality: Right;  . CHOLECYSTECTOMY    . COLONOSCOPY    . IR IMAGING GUIDED PORT INSERTION  08/14/2020  . KNEE ARTHROSCOPY  2003   bilateral  . POLYPECTOMY      Allergies: Brimonidine tartrate-timolol, Lisinopril, and Netarsudil dimesylate  Medications: Prior to Admission medications   Medication Sig  Start Date End Date Taking? Authorizing Provider  acyclovir (ZOVIRAX) 400 MG tablet Take 1 tablet (400 mg total) by mouth 2 (two) times daily. 05/29/20   Orson Slick, MD  amLODipine (NORVASC) 5 MG tablet Take 5 mg by mouth Daily.  11/28/11   [provider]  aspirin 81 MG tablet Take 81 mg by mouth daily.    [provider]  Calcium Carb-Cholecalciferol (CALCIUM 600 + D PO) Take 1 tablet by mouth daily.    [provider]  cholecalciferol (VITAMIN D) 1000 units tablet Take 1,000 Units by mouth daily.    [provider]  COVID-19 mRNA Vac-TriS, Pfizer, (PFIZER-BIONT COVID-19 VAC-TRIS) SUSP injection Inject into the muscle. 08/16/20   Carlyle Basques, MD  dexamethasone (DECADRON) 4 MG tablet Take 2 tablets (8 mg total) by mouth as directed. Take 2 pills (8mg ) twice daily on Days 3 and 4 of treatment 05/29/20   Orson Slick, MD  Latanoprostene Bunod (VYZULTA) 0.024 % SOLN Place 1 drop into both eyes at bedtime.    [provider]  metoprolol succinate (TOPROL-XL) 25 MG 24 hr tablet Take 25 mg by mouth Daily.  11/28/11   [provider]  ondansetron (ZOFRAN) 8 MG tablet Take 1 tablet (8 mg total) by mouth every 8 (eight) hours as needed for nausea or vomiting. 05/29/20   Ledell Peoples  IV, MD  phenytoin (DILANTIN) 100 MG ER capsule Take 200 mg by mouth 2 (two) times daily.    [provider]  pravastatin (PRAVACHOL) 80 MG tablet Take 80 mg by mouth Daily.  11/28/11   [provider]  prochlorperazine (COMPAZINE) 10 MG tablet Take 1 tablet (10 mg total) by mouth every 6 (six) hours as needed for nausea or vomiting. 05/29/20   Orson Slick, MD  timolol (BETIMOL) 0.5 % ophthalmic solution Place 1 drop into both eyes 2 (two) times daily.    [provider]     Family History  Problem Relation Age of Onset  . Colon cancer Neg Hx   . Colon polyps Neg Hx     Social History   Socioeconomic History  . Marital status:  Married    Spouse name: Not on file  . Number of children: Not on file  . Years of education: Not on file  . Highest education level: Not on file  Occupational History  . Not on file  Tobacco Use  . Smoking status: Former Research scientist (life sciences)  . Smokeless tobacco: Never Used  . Tobacco comment: quit 1984  Substance and Sexual Activity  . Alcohol use: Yes    Alcohol/week: 14.0 standard drinks    Types: 14 Glasses of wine per week  . Drug use: No  . Sexual activity: Not on file  Other Topics Concern  . Not on file  Social History Narrative  . Not on file   Social Determinants of Health   Financial Resource Strain: Not on file  Food Insecurity: Not on file  Transportation Needs: Not on file  Physical Activity: Not on file  Stress: Not on file  Social Connections: Not on file     Vital Signs: BP 130/63 (BP Location: Left Arm)   Pulse 65   SpO2 97%   Physical Exam Neck:     Comments: Large LN noted right upper anterior cervical area NT Soreness when palpate back of neck-- especially right No other nodes palpted Musculoskeletal:     Cervical back: Normal range of motion. No rigidity.  Skin:    General: Skin is warm.     Comments: Skin at [facility] site is clean and dry NT no swelling- no bleeding No sign of infection Healing well + ecchymosis around site-- probably 5 cm in size. Yellow in color Noted also is same yellowish color - what appears to be following a vein at left chest to left upper arm area. Again; NT to touch.       Imaging: No results found.  Labs:  CBC: Recent Labs    07/10/20 0952 07/25/20 1607 08/07/20 1036 08/14/20 1340  WBC 5.1 5.0 5.1 6.4  HGB 12.7* 11.6* 12.0* 12.5*  HCT 36.3* 33.1* 33.2* 36.0*  PLT 130* 192 160 147*    COAGS: Recent Labs    02/06/20 1115 08/14/20 1355  INR 1.0 1.0    BMP: Recent Labs    07/03/20 1607 07/10/20 0952 07/25/20 1607 08/07/20 1036  NA 141 142 141 141  K 4.6 3.9 4.2 4.2  CL 107 107 105 106  CO2 25  25 28 25   GLUCOSE 106* 93 132* 98  BUN 17 16 16 15   CALCIUM 9.4 9.0 8.9 9.2  CREATININE 0.83 0.75 0.81 0.76  GFRNONAA >60 >60 >60 >60    LIVER FUNCTION TESTS: Recent Labs    07/03/20 1607 07/10/20 0952 07/25/20 1607 08/07/20 1036  BILITOT 0.3 0.3 0.2* 0.3  AST 22 15 16  12*  ALT 23 12 14 10   ALKPHOS 108 106 107 110  PROT 7.8 7.5 6.9 6.9  ALBUMIN 3.8 3.8 3.6 3.7    Assessment:  PAC placed 08/14/20 Dr Serafina Royals Evaluation of PAC secondary pts complaint of right neck pain after insertion of PAC. I have examined pt-- Dr Serafina Royals has also evaluated pt. Reassurance- no infection; no sign of issue with PAC. Pt is to use [facility] June 14 for PET- to see Dr Lorenso Courier next day. Plan: continue care for now- if any problems arise - please call. When treatment resumes-- probable LAN swelling will resolve-- and hope pain will decrease. Continue Ibuprofen and Robaxin as needed. Pt and wife leave here today with good understanding of plan   Signed: Lavonia Drafts, PA-C 08/20/2020, 9:19 AM   Please refer to Dr. Serafina Royals attestation of this note for management and plan.

## 2020-08-21 ENCOUNTER — Telehealth: Payer: Self-pay | Admitting: Hematology and Oncology

## 2020-08-21 ENCOUNTER — Telehealth: Payer: Self-pay | Admitting: *Deleted

## 2020-08-21 ENCOUNTER — Other Ambulatory Visit: Payer: Self-pay | Admitting: Hematology and Oncology

## 2020-08-21 MED ORDER — TRAMADOL HCL 50 MG PO TABS: 50.0000 mg | ORAL_TABLET | Freq: Four times a day (QID) | ORAL | 0 refills | Status: DC | PRN

## 2020-08-21 NOTE — Telephone Encounter (Signed)
Scheduled appointment per 06/06 sch msg. Patient is aware. 

## 2020-08-21 NOTE — Telephone Encounter (Signed)
Received call from patient. He states he continues to have severe right neck pain-especially at night. Cannot sleep due to pain. He states it seems to come from where the lymph node in his right neck is so very enlarged, swollen. He has tried re-positioning, tylenol without relief.  He describes it as a very sharp shooting pain and mostly at night.  Dr. Lorenso Courier made aware. He will send in prescription for tramadol and we will see Brian Castillo tomorrow to evaluate his neck mass

## 2020-08-21 NOTE — Telephone Encounter (Signed)
Moved 6/9 appt to 6/10 due to provider being on call. Called and left msg

## 2020-08-22 ENCOUNTER — Ambulatory Visit (HOSPITAL_COMMUNITY)
Admission: RE | Admit: 2020-08-22 | Discharge: 2020-08-22 | Disposition: A | Discharge status: Home / Self Care | Payer: Medicare Other | Source: Ambulatory Visit | Attending: Hematology and Oncology | Admitting: Hematology and Oncology

## 2020-08-22 ENCOUNTER — Other Ambulatory Visit: Payer: Medicare Other

## 2020-08-22 ENCOUNTER — Other Ambulatory Visit: Payer: Self-pay | Admitting: Hematology and Oncology

## 2020-08-22 ENCOUNTER — Encounter: Payer: Self-pay | Admitting: Hematology and Oncology

## 2020-08-22 ENCOUNTER — Ambulatory Visit: Payer: Medicare Other | Admitting: Hematology and Oncology

## 2020-08-22 ENCOUNTER — Other Ambulatory Visit: Payer: Self-pay

## 2020-08-22 DIAGNOSIS — C8251 Diffuse follicle center lymphoma, lymph nodes of head, face, and neck: Secondary | ICD-10-CM | POA: Diagnosis present

## 2020-08-22 DIAGNOSIS — R918 Other nonspecific abnormal finding of lung field: Secondary | ICD-10-CM | POA: Diagnosis not present

## 2020-08-22 LAB — GLUCOSE, CAPILLARY: Glucose-Capillary: 115 mg/dL — ABNORMAL HIGH (ref 70–99)

## 2020-08-22 MED ORDER — FLUDEOXYGLUCOSE F - 18 (FDG) INJECTION
10.0000 | Freq: Once | INTRAVENOUS | Status: AC | PRN
  Administered 2020-08-22: 8.7 via INTRAVENOUS

## 2020-08-22 NOTE — Progress Notes (Signed)
DISCONTINUE ON PATHWAY REGIMEN - Lymphoma and CLL     A cycle is every 28 days:     Rituximab-xxxx      Bendamustine   **Always confirm dose/schedule in your pharmacy ordering system**  REASON: Disease Progression PRIOR TREATMENT: [id number]: Bendamustine + Rituximab IV (90/375) q28 Days x 6 Cycles TREATMENT RESPONSE: Progressive Disease (PD)  START OFF PATHWAY REGIMEN - Lymphoma and CLL   [id number]:R-miniCHOP (Rituximab IV + Cyclophosphamide IV + Doxorubicin IV + Vincristine IV + Prednisone PO) q21 Days x 6 Cycles:   A cycle is every 21 days:     Prednisone      Rituximab-xxxx      Cyclophosphamide      Doxorubicin      Vincristine   **Always confirm dose/schedule in your pharmacy ordering system**  Patient Characteristics: Follicular Lymphoma, Grades 1, 2, and 3A, Second Line, Prior Treatment with Bendamustine + Rituximab, Relapse ? 24 Months, Treating as Specialist or Consulting with Specialist Disease Type: Follicular Lymphoma, Grade 1, 2, or 3A Disease Type: Not Applicable Disease Type: Not Applicable Line of Therapy: Second Line Prior Treatment: Prior Treatment with Bendamustine + Rituximab Time to Relapse: Relapse ? 24 Months Please indicate whether you are: A specialist Intent of Therapy: Curative Intent, Discussed with Patient

## 2020-08-22 NOTE — Addendum Note (Signed)
Addended by: Tora Kindred on: 08/22/2020 03:55 PM   Modules accepted: Orders

## 2020-08-23 ENCOUNTER — Inpatient Hospital Stay: Payer: Medicare Other | Attending: Hematology and Oncology | Admitting: Hematology and Oncology

## 2020-08-23 ENCOUNTER — Other Ambulatory Visit: Payer: Medicare Other

## 2020-08-23 VITALS — BP 131/62 | HR 61 | Temp 97.5°F | Resp 17 | Wt 175.8 lb

## 2020-08-23 DIAGNOSIS — G40909 Epilepsy, unspecified, not intractable, without status epilepticus: Secondary | ICD-10-CM | POA: Diagnosis not present

## 2020-08-23 DIAGNOSIS — Z7982 Long term (current) use of aspirin: Secondary | ICD-10-CM | POA: Insufficient documentation

## 2020-08-23 DIAGNOSIS — I10 Essential (primary) hypertension: Secondary | ICD-10-CM | POA: Insufficient documentation

## 2020-08-23 DIAGNOSIS — Z5111 Encounter for antineoplastic chemotherapy: Secondary | ICD-10-CM | POA: Insufficient documentation

## 2020-08-23 DIAGNOSIS — M1990 Unspecified osteoarthritis, unspecified site: Secondary | ICD-10-CM | POA: Insufficient documentation

## 2020-08-23 DIAGNOSIS — Z7189 Other specified counseling: Secondary | ICD-10-CM

## 2020-08-23 DIAGNOSIS — Z5189 Encounter for other specified aftercare: Secondary | ICD-10-CM | POA: Insufficient documentation

## 2020-08-23 DIAGNOSIS — C8251 Diffuse follicle center lymphoma, lymph nodes of head, face, and neck: Secondary | ICD-10-CM | POA: Diagnosis not present

## 2020-08-23 DIAGNOSIS — Z79899 Other long term (current) drug therapy: Secondary | ICD-10-CM | POA: Insufficient documentation

## 2020-08-23 DIAGNOSIS — Z86010 Personal history of colonic polyps: Secondary | ICD-10-CM | POA: Insufficient documentation

## 2020-08-23 DIAGNOSIS — E785 Hyperlipidemia, unspecified: Secondary | ICD-10-CM | POA: Insufficient documentation

## 2020-08-23 DIAGNOSIS — C8511 Unspecified B-cell lymphoma, lymph nodes of head, face, and neck: Secondary | ICD-10-CM | POA: Diagnosis present

## 2020-08-23 NOTE — Progress Notes (Signed)
[place] [phone]   [phone]  PROGRESS NOTE  Patient Care Team: Shon Baton, MD as PCP - General (Internal Medicine)  Hematological/Oncological History #Follicular lymphoma Stage III 1) 01/30/2020: CT neck shows numerous enlarged lymph nodes, especially in the right submandibular space - which is the palpable abnormality. 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 Lorenso Courier  4) 02/22/2020: PET CT scan showed Deauville 4 and Deauville 5 adenopathy bilaterally in the neck. There is also a Deauville 4 left supraclavicular lymph node and a Deauville 4 right gastric lymph node 5) 04/24/2020: presents with rapid enlargement of his right submandibular lymph node.  6) 05/20/2020: partial excision of right submandibular lymph node with attempted fluid drainage. Pathology consistent with follicular lymphoma, no clear evidence of transformation.  7) 06/12/2020: Cycle 1 Day 1 of R-Benda. 8) 07/10/2020: Cycle 2 Day 1 of R-Benda. 9) 08/07/2020: Cycle 3 Day 1 of R-Benda. Noted to have enlargement of right cervical lymph node on exam.   Interval History:  Brian Castillo 80 y.o. 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 The patient's last visit was on 08/07/2020. In the interim since the last visit he has completed a PET scan which shows response in all sites of his disease with the exception of the right cervical lymph node.  On exam today Mr. Myler unfortunately has unfortunately continued to have an increase in the size of his right cervical lymph node.Brian Castillo  He notes that it is tender sore and growing rapidly.  Fortunately he has not had any other side effects and had previously been tolerating the treatment quite well.  His weight has been stable.  He denies any nausea, vomiting or abdominal pain. He has regular bowel movements  without any hematochezia or melena. He denies any fevers, chills, night sweats, chest pain, shortness of breath or cough. None of his other lymph nodes appear enlarged at this time. He has no other complaints. A full 10 point ROS is listed below.  The bulk of our discussion today focused on the treatment plan moving forward with R mini CHOP chemotherapy.  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   Seizures ([place])    last seizure was in 1988    SURGICAL HISTORY: Past Surgical History:  Procedure Laterality Date   CARPOMETACARPEL SUSPENSION PLASTY Right 08/24/2014   Procedure: RIGHT CMC ARTHOPLASTY WITH DOUBLE TENDON TRANSFER AND REPAIR  RECONSTRUCTION ;  Surgeon: Roseanne Kaufman, MD;  Location: [facility];  Service: Orthopedics;  Laterality: Right;   CHOLECYSTECTOMY     COLONOSCOPY     IR IMAGING GUIDED PORT INSERTION  08/14/2020   IR RADIOLOGIST EVAL & MGMT  08/20/2020   KNEE ARTHROSCOPY  2003   bilateral   POLYPECTOMY      SOCIAL HISTORY: Social History   Socioeconomic History   Marital status: Married    Spouse name: Not on file   Number of children: Not on file   Years of education: Not on file  Highest education level: Not on file  Occupational History   Not on file  Tobacco Use   Smoking status: Former    Pack years: 0.00   Smokeless tobacco: Never   Tobacco comments:    quit 1984  Substance and Sexual Activity   Alcohol use: Yes    Alcohol/week: 14.0 standard drinks    Types: 14 Glasses of wine per week   Drug use: No   Sexual activity: Not on file  Other Topics Concern   Not on file  Social History Narrative   Not on file   Social Determinants of Health   Financial Resource Strain:  Not on file  Food Insecurity: Not on file  Transportation Needs: Not on file  Physical Activity: Not on file  Stress: Not on file  Social Connections: Not on file  Intimate Partner Violence: Not on file    FAMILY HISTORY: Family History  Problem Relation Age of Onset   Colon cancer Neg Hx    Colon polyps Neg Hx     ALLERGIES:  is allergic to brimonidine tartrate-timolol, lisinopril, and netarsudil dimesylate.  MEDICATIONS:  Current Outpatient Medications  Medication Sig Dispense Refill   acyclovir (ZOVIRAX) 400 MG tablet Take 1 tablet (400 mg total) by mouth 2 (two) times daily. 60 tablet 5   amLODipine (NORVASC) 5 MG tablet Take 5 mg by mouth Daily.      aspirin 81 MG tablet Take 81 mg by mouth daily.     Calcium Carb-Cholecalciferol (CALCIUM 600 + D PO) Take 1 tablet by mouth daily.     cholecalciferol (VITAMIN D) 1000 units tablet Take 1,000 Units by mouth daily.     COVID-19 mRNA Vac-TriS, Pfizer, (PFIZER-BIONT COVID-19 VAC-TRIS) SUSP injection Inject into the muscle. 0.3 mL 0   dexamethasone (DECADRON) 4 MG tablet Take 2 tablets (8 mg total) by mouth as directed. Take 2 pills (8mg ) twice daily on Days 3 and 4 of treatment 8 tablet 5   Latanoprostene Bunod (VYZULTA) 0.024 % SOLN Place 1 drop into both eyes at bedtime.     metoprolol succinate (TOPROL-XL) 25 MG 24 hr tablet Take 25 mg by mouth Daily.      ondansetron (ZOFRAN) 8 MG tablet Take 1 tablet (8 mg total) by mouth every 8 (eight) hours as needed for nausea or vomiting. 30 tablet 0   phenytoin (DILANTIN) 100 MG ER capsule Take 200 mg by mouth 2 (two) times daily.     pravastatin (PRAVACHOL) 80 MG tablet Take 80 mg by mouth Daily.      prochlorperazine (COMPAZINE) 10 MG tablet Take 1 tablet (10 mg total) by mouth every 6 (six) hours as needed for nausea or vomiting. 30 tablet 0   timolol (BETIMOL) 0.5 % ophthalmic solution Place 1 drop into both eyes 2 (two) times daily.     traMADol (ULTRAM) 50 MG tablet Take 1 tablet  (50 mg total) by mouth every 6 (six) hours as needed. 25 tablet 0   No current facility-administered medications for this visit.    REVIEW OF SYSTEMS:   Constitutional: ( - ) fevers, ( - )  chills , ( - ) night sweats Eyes: ( - ) blurriness of vision, ( - ) double vision, ( - ) watery eyes Ears, nose, mouth, throat, and face: ( - ) mucositis, ( - ) sore throat Respiratory: ( - ) cough, ( - ) dyspnea, ( - ) wheezes Cardiovascular: ( - ) palpitation, ( - ) chest discomfort, ( - )  lower extremity swelling Gastrointestinal:  ( - ) nausea, ( - ) heartburn, ( - ) change in bowel habits Skin: ( - ) abnormal skin rashes Lymphatics: ( - ) new lymphadenopathy, ( - ) easy bruising Neurological: ( - ) numbness, ( - ) tingling, ( - ) new weaknesses Behavioral/Psych: ( - ) mood change, ( - ) new changes  All other systems were reviewed with the patient and are negative.  PHYSICAL EXAMINATION: ECOG PERFORMANCE STATUS: 1 - Symptomatic but completely ambulatory  Vitals:   08/23/20 0935  BP: 131/62  Pulse: 61  Resp: 17  Temp: (!) 97.5 F (36.4 C)  SpO2: 99%   Filed Weights   08/23/20 0935  Weight: 175 lb 12.8 oz (79.7 kg)    GENERAL: well appearing elderly Caucasian male in NAD  SKIN: skin color, texture, turgor are normal, no rashes or significant lesions EYES: conjunctiva are pink and non-injected, sclera clear NECK: supple, non-tender LYMPH:  Single palpable right sided submandibular lymph node. Otherwise no other marked palpable lymphadenopathy in the cervical, axillary or supraclavicular lymph nodes.  LUNGS: clear to auscultation and percussion with normal breathing effort HEART: regular rate & rhythm and no murmurs and no lower extremity edema Musculoskeletal: no cyanosis of digits and no clubbing  PSYCH: alert & oriented x 3, fluent speech NEURO: no focal motor/sensory deficits  LABORATORY DATA:  I have reviewed the data as listed CBC Latest Ref Rng & Units 08/14/2020 08/07/2020  07/25/2020  WBC 4.0 - 10.5 K/uL 6.4 5.1 5.0  Hemoglobin 13.0 - 17.0 g/dL 12.5(L) 12.0(L) 11.6(L)  Hematocrit 39.0 - 52.0 % 36.0(L) 33.2(L) 33.1(L)  Platelets 150 - 400 K/uL 147(L) 160 192    CMP Latest Ref Rng & Units 08/07/2020 07/25/2020 07/10/2020  Glucose 70 - 99 mg/dL 98 132(H) 93  BUN 8 - 23 mg/dL 15 16 16   Creatinine 0.61 - 1.24 mg/dL 0.76 0.81 0.75  Sodium 135 - 145 mmol/L 141 141 142  Potassium 3.5 - 5.1 mmol/L 4.2 4.2 3.9  Chloride 98 - 111 mmol/L 106 105 107  CO2 22 - 32 mmol/L 25 28 25   Calcium 8.9 - 10.3 mg/dL 9.2 8.9 9.0  Total Protein 6.5 - 8.1 g/dL 6.9 6.9 7.5  Total Bilirubin 0.3 - 1.2 mg/dL 0.3 0.2(L) 0.3  Alkaline Phos 38 - 126 U/L 110 107 106  AST 15 - 41 U/L 12(L) 16 15  ALT 0 - 44 U/L 10 14 12    RADIOGRAPHIC STUDIES: I have personally reviewed the radiological images as listed and agreed with the findings in the report: stable lymphadenopathy with exception of the rapidly enlarging submandibular lymph node.   NM PET Image Restag (PS) Skull Base To Thigh  Result Date: 08/23/2020 CLINICAL DATA:  Subsequent treatment strategy for follicular lymphoma. EXAM: NUCLEAR MEDICINE PET SKULL BASE TO THIGH TECHNIQUE: 8.7 mCi F-18 FDG was injected intravenously. Full-ring PET imaging was performed from the skull base to thigh after the radiotracer. CT data was obtained and used for attenuation correction and anatomic localization. Fasting blood glucose: 115 mg/dl COMPARISON:  December of 2021. FINDINGS: Mediastinal blood pool activity: SUV max 3.1 Liver activity: SUV max 4.48 NECK: RIGHT level IB lymph node has enlarged since the previous study. This now measures approximately 3.8 x 5.0 cm with a maximum SUV of 23.1 as compared to 14.2 on the prior study (image 37/4). Lymph nodes in the LEFT neck have resolved. Previous lymph node at the LEFT level II level that showed a maximum SUV  of 10.3 posterior to the sternocleidomastoid on the prior study is no longer visible. No additional  signs of nodal disease in the neck. Incidental CT findings: none CHEST: No hypermetabolic mediastinal or hilar nodes. Scattered nodular foci show improvement since previous imaging. There is a stable nodule in the LEFT mid chest (image 45/8) 7 mm without significant FDG uptake maximum SUV less than 1. Other scattered small nodules in the chest many of which have resolved. In an area that previously showed branching tree-in-bud changes there is a focal nodule that measures 8 x 5 mm with a maximum SUV of 1.5. Airways are patent. Incidental CT findings: Calcified atheromatous plaque of the thoracic aorta. Normal heart size without pericardial effusion. Normal caliber of the central pulmonary vessels. RIGHT-sided Port-A-Cath terminates at the caval to atrial junction. Limited assessment of cardiovascular structures given lack of intravenous contrast. Esophagus mildly patulous. ABDOMEN/PELVIS: Resolution of small lymph node in the gastrohepatic recess otherwise negative appearance of the abdomen and pelvis, no signs of FDG avid disease. Incidental CT findings: Post cholecystectomy. Unremarkable appearance of liver, spleen, pancreas, adrenal glands and kidneys. Urinary bladder is collapsed. No hydronephrosis. Stable haziness of the jejunal mesentery, remains nonspecific with calcifications in the ileal mesentery. Calcified atheromatous plaque of the abdominal aorta. No aneurysmal dilation. Prostate with enlargement as before. Small fat containing umbilical hernia and LEFT inguinal hernia. SKELETON: No focal hypermetabolic activity to suggest skeletal metastasis. Incidental CT findings: Spinal degenerative changes. IMPRESSION: Deauville 5 changes with marked enlargement of submandibular lymph node on the RIGHT with markedly increased metabolic activity over previous imaging. Resolution of LEFT neck lymph nodes and lymph node in the upper abdomen. These areas are no longer visible. No additional new or progressive findings.  Scattered pulmonary nodules favored to represent post infectious or inflammatory changes. Attention on follow-up. Stable haziness of jejunal mesentery. Attention on follow-up. Not associated with adenopathy or significant hypermetabolism. Electronically Signed   By: Zetta Bills M.D.   On: 08/23/2020 15:17   ECHOCARDIOGRAM COMPLETE  Result Date: 08/13/2020    ECHOCARDIOGRAM REPORT   Patient Name:   Brian Castillo Green Date of Exam: 08/13/2020 Medical Rec #:  [id number]       Height:       68.0 in Accession #:    [id number]      Weight:       175.4 lb Date of Birth:  09/14/1940        BSA:          1.933 m Patient Age:    53 years        BP:           141/57 mmHg Patient Gender: M               HR:           64 bpm. Exam Location:  Outpatient Procedure: 2D Echo, Cardiac Doppler, Color Doppler and Strain Analysis Indications:    Chemo Z09  History:        Patient has no prior history of Echocardiogram examinations.                 Risk Factors:Hypertension and Dyslipidemia.  Sonographer:    Bernadene Person RDCS Referring Phys: [id number] [place]  1. Left ventricular ejection fraction, by estimation, is 60 to 65%. The left ventricle has normal function. The left ventricle has no regional wall motion abnormalities. Left ventricular diastolic parameters are indeterminate.  2. Right ventricular systolic function  is normal. The right ventricular size is normal.  3. The mitral valve is normal in structure. No evidence of mitral valve regurgitation.  4. The aortic valve is grossly normal. There is mild calcification of the aortic valve. Aortic valve regurgitation is not visualized. No aortic stenosis is present. FINDINGS  Left Ventricle: Left ventricular ejection fraction, by estimation, is 60 to 65%. The left ventricle has normal function. The left ventricle has no regional wall motion abnormalities. The left ventricular internal cavity size was normal in size. There is  no left ventricular hypertrophy. Left  ventricular diastolic parameters are indeterminate. Right Ventricle: The right ventricular size is normal. Right vetricular wall thickness was not well visualized. Right ventricular systolic function is normal. Left Atrium: Left atrial size was normal in size. Right Atrium: Right atrial size was normal in size. Pericardium: There is no evidence of pericardial effusion. Mitral Valve: The mitral valve is normal in structure. No evidence of mitral valve regurgitation. Tricuspid Valve: The tricuspid valve is grossly normal. Tricuspid valve regurgitation is trivial. Aortic Valve: The aortic valve is grossly normal. There is mild calcification of the aortic valve. Aortic valve regurgitation is not visualized. No aortic stenosis is present. Pulmonic Valve: The pulmonic valve was normal in structure. Pulmonic valve regurgitation is trivial. Aorta: The aortic root and ascending aorta are structurally normal, with no evidence of dilitation. IAS/Shunts: The atrial septum is grossly normal.  LEFT VENTRICLE PLAX 2D LVIDd:         5.00 cm  Diastology LVIDs:         3.00 cm  LV e' medial:    5.15 cm/s LV PW:         0.90 cm  LV E/e' medial:  14.1 LV IVS:        0.90 cm  LV e' lateral:   5.63 cm/s LVOT diam:     2.10 cm  LV E/e' lateral: 12.9 LV SV:         79 LV SV Index:   41       2D Longitudinal Strain LVOT Area:     3.46 cm 2D Strain GLS Avg:     -24.6 %  RIGHT VENTRICLE TAPSE (M-mode): 2.3 cm LEFT ATRIUM             Index       RIGHT ATRIUM           Index LA diam:        4.60 cm 2.38 cm/m  RA Area:     13.20 cm LA Vol (A2C):   47.2 ml 24.42 ml/m RA Volume:   27.00 ml  13.97 ml/m LA Vol (A4C):   54.0 ml 27.94 ml/m LA Biplane Vol: 52.1 ml 26.95 ml/m  AORTIC VALVE LVOT Vmax:   113.00 cm/s LVOT Vmean:  70.600 cm/s LVOT VTI:    0.227 m  AORTA Ao Root diam: 3.70 cm Ao Asc diam:  3.30 cm MITRAL VALVE MV Area (PHT): 2.80 cm    SHUNTS MV Decel Time: 271 msec    Systemic VTI:  0.23 m MV E velocity: 72.40 cm/s  Systemic Diam:  2.10 cm MV A velocity: 74.10 cm/s MV E/A ratio:  0.98 Mertie Moores MD Electronically signed by Mertie Moores MD Signature Date/Time: 08/13/2020/6:06:16 PM    Final    IR IMAGING GUIDED PORT INSERTION  Result Date: 08/14/2020 INDICATION: 80 year old male with history of recurrent follicular lymphoma requiring central venous access for chemotherapy. EXAM: IMPLANTED PORT A CATH PLACEMENT  WITH ULTRASOUND AND FLUOROSCOPIC GUIDANCE COMPARISON:  None. MEDICATIONS: None. ANESTHESIA/SEDATION: Moderate (conscious) sedation was employed during this procedure. A total of Versed 2 mg and Fentanyl 75 mcg was administered intravenously. Moderate Sedation Time: 15 minutes. The patient's level of consciousness and vital signs were monitored continuously by radiology nursing throughout the procedure under my direct supervision. CONTRAST:  None FLUOROSCOPY TIME:  0 minutes, 12 seconds (2 mGy) COMPLICATIONS: None immediate. PROCEDURE: The procedure, risks, benefits, and alternatives were explained to the patient. Questions regarding the procedure were encouraged and answered. The patient understands and consents to the procedure. The right neck and chest were prepped with chlorhexidine in a sterile fashion, and a sterile drape was applied covering the operative field. Maximum barrier sterile technique with sterile gowns and gloves were used for the procedure. A timeout was performed prior to the initiation of the procedure. Ultrasound was used to examine the jugular vein which was compressible and free of internal echoes. A skin marker was used to demarcate the planned venotomy and port pocket incision sites. Local anesthesia was provided to these sites and the subcutaneous tunnel track with 1% lidocaine with 1:100,000 epinephrine. A small incision was created at the jugular access site and blunt dissection was performed of the subcutaneous tissues. Under ultrasound guidance, the jugular vein was accessed with a 21 ga micropuncture  needle and an 0.018" wire was inserted to the superior vena cava. Real-time ultrasound guidance was utilized for vascular access including the acquisition of a permanent ultrasound image documenting patency of the accessed vessel. A 5 Fr micopuncture set was then used, through which a 0.035" Rosen wire was passed under fluoroscopic guidance into the inferior vena cava. An 8 Fr dilator was then placed over the wire. A subcutaneous port pocket was then created along the upper chest wall utilizing a combination of sharp and blunt dissection. The pocket was irrigated with sterile saline, packed with gauze, and observed for hemorrhage. A single lumen "ISP" sized power injectable port was chosen for placement. The 8 Fr catheter was tunneled from the port pocket site to the venotomy incision. The port was placed in the pocket. The external catheter was trimmed to appropriate length. The dilator was exchanged for an 8 Fr peel-away sheath under fluoroscopic guidance. The catheter was then placed through the sheath and the sheath was removed. Final catheter positioning was confirmed and documented with a fluoroscopic spot radiograph. The port was accessed with a Huber needle, aspirated, and flushed with heparinized saline. The deep dermal layer of the port pocket incision was closed with interrupted 3-0 Vicryl suture. Dermabond was then placed over the port pocket and neck incisions. The patient tolerated the procedure well without immediate post procedural complication. FINDINGS: After catheter placement, the tip lies within the superior cavoatrial junction. The catheter aspirates and flushes normally and is ready for immediate use. IMPRESSION: Successful placement of a power injectable Port-A-Cath via the right internal jugular vein. The catheter is ready for immediate use. Ruthann Cancer, MD [facility] Electronically Signed   By: Ruthann Cancer MD   On: 08/14/2020  15:11   IR Radiologist Eval & Mgmt  Result Date: 08/20/2020 Please refer to notes tab for details about interventional procedure. (Op Note)   ASSESSMENT & PLAN Suleman J Row 80 y.o. 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 The patient's last visit was on 02/12/2020 at which time he establish care.  After review  the labs, the records, discussion with the patient the findings are most consistent with a stage III follicular lymphoma based on the PET CT scan.  There are lymph nodes on both sides of diaphragm and therefore stage III would be the most appropriate designation.    Previously we discussed the nature of follicular lymphoma and the treatment options moving forward.  The options at this time would be observation, immunotherapy, or combination of chemotherapy and immunotherapy.  Given the lack of symptoms, cytopenias, and bulky disease at the time of presentation we decided to proceed with observation alone. Unfortunately due to rapid progression we need to consider treatment moving forward.  Additionally we discussed rituximab monotherapy with weekly dosing x4 doses followed by every 2 months dosing x4 as a treatment option as well as bendamustine and rituximab on days 1 and 2 of a 28-day cycle x6.  We discussed the side effects, risks and benefits, and possible outcomes of treatment.  The patient voiced his understanding of these options and wished to proceed with R-Benda.   Over the period of one month the patient was found to have a rapidly enlarging right submandibular lymph node.  Due to concern for this enlarging lymph node we ordered a CT scan of the chest abdomen pelvis to assess for progression elsewhere, but that was the only lymph node that was progressing. Biopsy with ENT showed no progression into a DLBCL or other aggressive variant.  On PET CT scan from 08/23/2020 the patient showed complete response of disease with  every site with the exception of his right cervical lymph node which is increasing in size and brightness.  As such we have to discontinue first-line therapy with rituximab and Bendamustine and transition over to R mini CHOP.  Our plan is for R mini CHOP chemotherapy to start on 08/28/2020.  #Follicular lymphoma, Stage III --new baseline PET CT scan unfortunately showed progression of the right cervical lymph node.  We are currently planning for 2nd line therapy with R-miniCHOP(which would also be effective if patient is having transformation of the tumor).  -- patient completed PET, Port, and TTE prior to start of R-miniCHOP. --prior PET CT findings are consistent with a Stage III follicular lymphoma. Due to rapidly enlarging submandibular lymph node we proceeded with treatment.  --imaging shows stable disease save for a single enlarging submandibular lymph node.  Biopsy has not shown transformation into a more aggressive lymphoma.  --after discussion with the patient, he was agreeable to starting Bendamustine/Rituximab as first line treatment.  --started Cycle 1 Day 1 of R-Benda chemotherapy on 06/12/2020 --completed Cycle 3 Day of R-Benda. Unfortunately he is having progression for which we are changing therapy to R-miniCHOP.  --RTC in 1 week to start Cycle 1 Day 1 of R-miniCHOP  #Medication Interaction --Allopurinol increases the levels of Dilantin. --We will work with the patient's primary care provider Dr. Shon Baton to monitor his Dilantin levels and adjust accordingly. For now we are following this weekly.  #Fever, resolved --Single episode of fever up to 101 F occurred on 06/25/2020. --Patient was not neutropenic at that time and was evaluated emergency department with infectious work-up including chest x-ray, blood cultures, and urine culture.  Data at that time showed no evidence of infectious disease. --No further fevers noted since that time.  We will continue to monitor.  #Supportive  Care --chemotherapy education completed --zofran 8mg  q8H PRN and compazine 10mg  PO q6H for nausea --acyclovir 400mg  PO BID for VCZ prophylaxis -- allopurinol 300mg   PO daily for TLS prophylaxis -- port in place -- no pain medication required at this time.    No orders of the defined types were placed in this encounter.  All questions were answered. The patient knows to call the clinic with any problems, questions or concerns.  A total of more than 30 minutes were spent on this encounter and over half of that time was spent on counseling and coordination of care as outlined above.   Ledell Peoples, MD Department of Hematology/Oncology [facility] Phone: [phone] Pager: [phone] Email: [email or web address]  08/27/2020 2:43 PM

## 2020-08-27 ENCOUNTER — Encounter: Payer: Self-pay | Admitting: Hematology and Oncology

## 2020-08-27 MED FILL — Dexamethasone Sodium Phosphate Inj 100 MG/10ML: INTRAMUSCULAR | Qty: 1 | Status: AC

## 2020-08-28 ENCOUNTER — Inpatient Hospital Stay: Payer: Medicare Other

## 2020-08-28 ENCOUNTER — Other Ambulatory Visit: Payer: Self-pay

## 2020-08-28 ENCOUNTER — Encounter: Payer: Self-pay | Admitting: Hematology and Oncology

## 2020-08-28 ENCOUNTER — Inpatient Hospital Stay (HOSPITAL_BASED_OUTPATIENT_CLINIC_OR_DEPARTMENT_OTHER): Payer: Medicare Other | Admitting: Hematology and Oncology

## 2020-08-28 VITALS — BP 132/59 | HR 65 | Temp 96.9°F | Resp 17 | Wt 174.6 lb

## 2020-08-28 DIAGNOSIS — Z5112 Encounter for antineoplastic immunotherapy: Secondary | ICD-10-CM

## 2020-08-28 DIAGNOSIS — C8251 Diffuse follicle center lymphoma, lymph nodes of head, face, and neck: Secondary | ICD-10-CM

## 2020-08-28 DIAGNOSIS — Z5111 Encounter for antineoplastic chemotherapy: Secondary | ICD-10-CM

## 2020-08-28 DIAGNOSIS — C8511 Unspecified B-cell lymphoma, lymph nodes of head, face, and neck: Secondary | ICD-10-CM | POA: Diagnosis not present

## 2020-08-28 DIAGNOSIS — Z7189 Other specified counseling: Secondary | ICD-10-CM

## 2020-08-28 LAB — CMP (CANCER CENTER ONLY)
ALT: 11 U/L (ref 0–44)
AST: 13 U/L — ABNORMAL LOW (ref 15–41)
Albumin: 3.4 g/dL — ABNORMAL LOW (ref 3.5–5.0)
Alkaline Phosphatase: 108 U/L (ref 38–126)
Anion gap: 9 (ref 5–15)
BUN: 17 mg/dL (ref 8–23)
CO2: 25 mmol/L (ref 22–32)
Calcium: 9 mg/dL (ref 8.9–10.3)
Chloride: 108 mmol/L (ref 98–111)
Creatinine: 0.76 mg/dL (ref 0.61–1.24)
GFR, Estimated: >60 mL/min (ref >60)
Glucose, Bld: 131 mg/dL — ABNORMAL HIGH (ref 70–99)
Potassium: 3.5 mmol/L (ref 3.5–5.1)
Sodium: 142 mmol/L (ref 135–145)
Total Bilirubin: 0.3 mg/dL (ref 0.3–1.2)
Total Protein: 6.7 g/dL (ref 6.5–8.1)

## 2020-08-28 LAB — CBC WITH DIFFERENTIAL (CANCER CENTER ONLY)
Abs Immature Granulocytes: 0.02 10*3/uL (ref 0.00–0.07)
Basophils Absolute: 0 10*3/uL (ref 0.0–0.1)
Basophils Relative: 1 %
Eosinophils Absolute: 0.3 10*3/uL (ref 0.0–0.5)
Eosinophils Relative: 9 %
HCT: 30.4 % — ABNORMAL LOW (ref 39.0–52.0)
Hemoglobin: 10.7 g/dL — ABNORMAL LOW (ref 13.0–17.0)
Immature Granulocytes: 1 %
Lymphocytes Relative: 5 %
Lymphs Abs: 0.2 10*3/uL — ABNORMAL LOW (ref 0.7–4.0)
MCH: 33.1 pg (ref 26.0–34.0)
MCHC: 35.2 g/dL (ref 30.0–36.0)
MCV: 94.1 fL (ref 80.0–100.0)
Monocytes Absolute: 0.5 10*3/uL (ref 0.1–1.0)
Monocytes Relative: 14 %
Neutro Abs: 2.7 10*3/uL (ref 1.7–7.7)
Neutrophils Relative %: 70 %
Platelet Count: 156 10*3/uL (ref 150–400)
RBC: 3.23 MIL/uL — ABNORMAL LOW (ref 4.22–5.81)
RDW: 13.2 % (ref 11.5–15.5)
WBC Count: 3.8 10*3/uL — ABNORMAL LOW (ref 4.0–10.5)
nRBC: 0 % (ref 0.0–0.2)

## 2020-08-28 LAB — URIC ACID: Uric Acid, Serum: 6.7 mg/dL (ref 3.7–8.6)

## 2020-08-28 LAB — LACTATE DEHYDROGENASE: LDH: 283 U/L — ABNORMAL HIGH (ref 98–192)

## 2020-08-28 MED ORDER — HEPARIN SOD (PORK) LOCK FLUSH 100 UNIT/ML IV SOLN
500.0000 [IU] | Freq: Once | INTRAVENOUS | Status: AC | PRN
  Administered 2020-08-28: 500 [IU]
  Filled 2020-08-28: qty 5

## 2020-08-28 MED ORDER — PREDNISONE 20 MG PO TABS
60.0000 mg | ORAL_TABLET | Freq: Once | ORAL | Status: AC
  Administered 2020-08-28: 60 mg via ORAL

## 2020-08-28 MED ORDER — PREDNISONE 20 MG PO TABS: 60.0000 mg | ORAL_TABLET | ORAL | 6 refills | Status: DC

## 2020-08-28 MED ORDER — ACETAMINOPHEN 325 MG PO TABS
ORAL_TABLET | ORAL | Status: AC
  Filled 2020-08-28: qty 2

## 2020-08-28 MED ORDER — ACETAMINOPHEN 325 MG PO TABS
650.0000 mg | ORAL_TABLET | Freq: Once | ORAL | Status: AC
  Administered 2020-08-28: 650 mg via ORAL

## 2020-08-28 MED ORDER — DIPHENHYDRAMINE HCL 25 MG PO CAPS
50.0000 mg | ORAL_CAPSULE | Freq: Once | ORAL | Status: AC
Start: 2020-08-28 — End: 2020-08-28
  Administered 2020-08-28: 50 mg via ORAL

## 2020-08-28 MED ORDER — PALONOSETRON HCL INJECTION 0.25 MG/5ML
INTRAVENOUS | Status: AC
  Filled 2020-08-28: qty 5

## 2020-08-28 MED ORDER — SODIUM CHLORIDE 0.9% FLUSH
10.0000 mL | INTRAVENOUS | Status: DC | PRN
  Administered 2020-08-28: 10 mL via INTRAVENOUS
  Filled 2020-08-28: qty 10

## 2020-08-28 MED ORDER — VINCRISTINE SULFATE CHEMO INJECTION 1 MG/ML
1.0000 mg | Freq: Once | INTRAVENOUS | Status: AC
  Administered 2020-08-28: 1 mg via INTRAVENOUS
  Filled 2020-08-28: qty 1

## 2020-08-28 MED ORDER — SODIUM CHLORIDE 0.9% FLUSH
10.0000 mL | INTRAVENOUS | Status: DC | PRN
  Administered 2020-08-28: 10 mL
  Filled 2020-08-28: qty 10

## 2020-08-28 MED ORDER — PALONOSETRON HCL INJECTION 0.25 MG/5ML
0.2500 mg | Freq: Once | INTRAVENOUS | Status: AC
  Administered 2020-08-28: 0.25 mg via INTRAVENOUS

## 2020-08-28 MED ORDER — DIPHENHYDRAMINE HCL 25 MG PO CAPS
ORAL_CAPSULE | ORAL | Status: AC
  Filled 2020-08-28: qty 2

## 2020-08-28 MED ORDER — SODIUM CHLORIDE 0.9 % IV SOLN
Freq: Once | INTRAVENOUS | Status: AC
  Filled 2020-08-28: qty 250

## 2020-08-28 MED ORDER — SODIUM CHLORIDE 0.9 % IV SOLN
150.0000 mg | Freq: Once | INTRAVENOUS | Status: AC
  Administered 2020-08-28: 150 mg via INTRAVENOUS
  Filled 2020-08-28: qty 150

## 2020-08-28 MED ORDER — SODIUM CHLORIDE 0.9 % IV SOLN
375.0000 mg/m2 | Freq: Once | INTRAVENOUS | Status: AC
  Administered 2020-08-28: 700 mg via INTRAVENOUS
  Filled 2020-08-28: qty 20

## 2020-08-28 MED ORDER — SODIUM CHLORIDE 0.9 % IV SOLN
400.0000 mg/m2 | Freq: Once | INTRAVENOUS | Status: AC
  Administered 2020-08-28: 780 mg via INTRAVENOUS
  Filled 2020-08-28: qty 39

## 2020-08-28 MED ORDER — DOXORUBICIN HCL CHEMO IV INJECTION 2 MG/ML
25.0000 mg/m2 | Freq: Once | INTRAVENOUS | Status: AC
  Administered 2020-08-28: 48 mg via INTRAVENOUS
  Filled 2020-08-28: qty 24

## 2020-08-28 MED ORDER — PREDNISONE 50 MG PO TABS
ORAL_TABLET | ORAL | Status: AC
  Filled 2020-08-28: qty 1

## 2020-08-28 MED ORDER — LIDOCAINE-PRILOCAINE 2.5-2.5 % EX CREA: 1.0000 "application " | TOPICAL_CREAM | CUTANEOUS | 0 refills | Status: AC | PRN

## 2020-08-28 MED ORDER — OXYCODONE HCL 5 MG PO TABS: 5.0000 mg | ORAL_TABLET | Freq: Four times a day (QID) | ORAL | 0 refills | Status: DC | PRN

## 2020-08-28 NOTE — Patient Instructions (Signed)
[facility]  Discharge Instructions: Thank you for choosing [facility] to provide your oncology and hematology care.   If you have a lab appointment with the [facility], please go directly to the [facility] and check in at the registration area.   Wear comfortable clothing and clothing appropriate for easy access to any Portacath or PICC line.   We strive to give you quality time with your provider. You may need to reschedule your appointment if you arrive late (15 or more minutes).  Arriving late affects you and other patients whose appointments are after yours.  Also, if you miss three or more appointments without notifying the office, you may be dismissed from the clinic at the provider's discretion.      For prescription refill requests, have your pharmacy contact our office and allow 72 hours for refills to be completed.    Today you received the following chemotherapy and/or immunotherapy agents Rituxan,Adriamycin,Vincristine,Cytoxan      To help prevent nausea and vomiting after your treatment, we encourage you to take your nausea medication as directed.  BELOW ARE SYMPTOMS THAT SHOULD BE REPORTED IMMEDIATELY: *FEVER GREATER THAN 100.4 F (38 C) OR HIGHER *CHILLS OR SWEATING *NAUSEA AND VOMITING THAT IS NOT CONTROLLED WITH YOUR NAUSEA MEDICATION *UNUSUAL SHORTNESS OF BREATH *UNUSUAL BRUISING OR BLEEDING *URINARY PROBLEMS (pain or burning when urinating, or frequent urination) *BOWEL PROBLEMS (unusual diarrhea, constipation, pain near the anus) TENDERNESS IN MOUTH AND THROAT WITH OR WITHOUT PRESENCE OF ULCERS (sore throat, sores in mouth, or a toothache) UNUSUAL RASH, SWELLING OR PAIN  UNUSUAL VAGINAL DISCHARGE OR ITCHING   Items with * indicate a potential emergency and should be followed up as soon as possible or go to the Emergency Department if any problems should occur.  Please show the CHEMOTHERAPY ALERT CARD or  IMMUNOTHERAPY ALERT CARD at check-in to the Emergency Department and triage nurse.  Should you have questions after your visit or need to cancel or reschedule your appointment, please contact [facility]  Dept: [phone]  and follow the prompts.  Office hours are 8:00 a.m. to 4:30 p.m. Monday - Friday. Please note that voicemails left after 4:00 p.m. may not be returned until the following business day.  We are closed weekends and major holidays. You have access to a nurse at all times for urgent questions. Please call the main number to the clinic Dept: [phone] and follow the prompts.   For any non-urgent questions, you may also contact your provider using MyChart. We now offer e-Visits for anyone 51 and older to request care online for non-urgent symptoms. For details visit [email or web address].   Also download the MyChart app! Go to the app store, search "MyChart", open the app, select [facility], and log in with your MyChart username and password.  Due to Covid, a mask is required upon entering the hospital/clinic. If you do not have a mask, one will be given to you upon arrival. For doctor visits, patients may have 1 support person aged 82 or older with them. For treatment visits, patients cannot have anyone with them due to current Covid guidelines and our immunocompromised population.

## 2020-08-28 NOTE — Progress Notes (Signed)
[place] [phone]   [phone]  PROGRESS NOTE  Patient Care Team: Shon Baton, MD as PCP - General (Internal Medicine)  Hematological/Oncological History #Follicular lymphoma Stage III 1) 01/30/2020: CT neck shows numerous enlarged lymph nodes, especially in the right submandibular space - which is the palpable abnormality. 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 Lorenso Courier  4) 02/22/2020: PET CT scan showed Deauville 4 and Deauville 5 adenopathy bilaterally in the neck. There is also a Deauville 4 left supraclavicular lymph node and a Deauville 4 right gastric lymph node 5) 04/24/2020: presents with rapid enlargement of his right submandibular lymph node.  6) 05/20/2020: partial excision of right submandibular lymph node with attempted fluid drainage. Pathology consistent with follicular lymphoma, no clear evidence of transformation.  7) 06/12/2020: Cycle 1 Day 1 of R-Benda. 8) 07/10/2020: Cycle 2 Day 1 of R-Benda. 9) 08/07/2020: Cycle 3 Day 1 of R-Benda. Noted to have enlargement of right cervical lymph node on exam.  10) 08/28/2020: Cycle 1 Day 1 of R-miniCHOP  Interval History:  Brian Castillo 80 y.o. 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 The patient's last visit was on 08/23/2020. In the interim since the last visit his right cervical lymph node has continued to grow.  On exam today Brian Castillo is accompanied by his wife.  He reports that the lymph node continues to grow even since our last visit.  He reports that it caused him such pain last night that he was not able to sleep properly, even despite taking his tramadol.  He reports that he took 1 tramadol and another 1 6 hours later with minimal relief.  His weight has been stable.  He denies any nausea, vomiting or abdominal pain. He has regular bowel  movements without any hematochezia or melena. He denies any fevers, chills, night sweats, chest pain, shortness of breath or cough. None of his other lymph nodes appear enlarged at this time. He has no other complaints. A full 10 point ROS is listed below.  The bulk of our discussion today focused on the treatment plan moving forward with R mini CHOP chemotherapy.  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   Seizures ([place])    last seizure was in 1988    SURGICAL HISTORY: Past Surgical History:  Procedure Laterality Date   CARPOMETACARPEL SUSPENSION PLASTY Right 08/24/2014   Procedure: RIGHT CMC ARTHOPLASTY WITH DOUBLE TENDON TRANSFER AND REPAIR  RECONSTRUCTION ;  Surgeon: Roseanne Kaufman, MD;  Location: [facility];  Service: Orthopedics;  Laterality: Right;   CHOLECYSTECTOMY     COLONOSCOPY     IR IMAGING GUIDED PORT INSERTION  08/14/2020   IR RADIOLOGIST EVAL & MGMT  08/20/2020   KNEE ARTHROSCOPY  2003   bilateral   POLYPECTOMY      SOCIAL HISTORY: Social History   Socioeconomic History   Marital status: Married    Spouse name: Not on file   Number of children: Not on file   Years  of education: Not on file   Highest education level: Not on file  Occupational History   Not on file  Tobacco Use   Smoking status: Former    Pack years: 0.00   Smokeless tobacco: Never   Tobacco comments:    quit 1984  Substance and Sexual Activity   Alcohol use: Yes    Alcohol/week: 14.0 standard drinks    Types: 14 Glasses of wine per week   Drug use: No   Sexual activity: Not on file  Other Topics Concern   Not on file  Social History Narrative   Not on file   Social Determinants of Health   Financial Resource  Strain: Not on file  Food Insecurity: Not on file  Transportation Needs: Not on file  Physical Activity: Not on file  Stress: Not on file  Social Connections: Not on file  Intimate Partner Violence: Not on file    FAMILY HISTORY: Family History  Problem Relation Age of Onset   Colon cancer Neg Hx    Colon polyps Neg Hx     ALLERGIES:  is allergic to brimonidine tartrate-timolol, lisinopril, and netarsudil dimesylate.  MEDICATIONS:  Current Outpatient Medications  Medication Sig Dispense Refill   lidocaine-prilocaine (EMLA) cream Apply 1 application topically as needed. 30 g 0   oxyCODONE (OXY IR/ROXICODONE) 5 MG immediate release tablet Take 1 tablet (5 mg total) by mouth every 6 (six) hours as needed for severe pain. 30 tablet 0   predniSONE (DELTASONE) 20 MG tablet Take 3 tablets (60 mg total) by mouth as directed. Take 3 tablets (60mg ) by mouth daily starting on Chemotherapy Day 1 and continuing for 4 days afterwards. 15 tablet 6   acyclovir (ZOVIRAX) 400 MG tablet Take 1 tablet (400 mg total) by mouth 2 (two) times daily. 60 tablet 5   amLODipine (NORVASC) 5 MG tablet Take 5 mg by mouth Daily.      aspirin 81 MG tablet Take 81 mg by mouth daily.     Calcium Carb-Cholecalciferol (CALCIUM 600 + D PO) Take 1 tablet by mouth daily.     cholecalciferol (VITAMIN D) 1000 units tablet Take 1,000 Units by mouth daily.     COVID-19 mRNA Vac-TriS, Pfizer, (PFIZER-BIONT COVID-19 VAC-TRIS) SUSP injection Inject into the muscle. 0.3 mL 0   dexamethasone (DECADRON) 4 MG tablet Take 2 tablets (8 mg total) by mouth as directed. Take 2 pills (8mg ) twice daily on Days 3 and 4 of treatment 8 tablet 5   Latanoprostene Bunod (VYZULTA) 0.024 % SOLN Place 1 drop into both eyes at bedtime.     metoprolol succinate (TOPROL-XL) 25 MG 24 hr tablet Take 25 mg by mouth Daily.      ondansetron (ZOFRAN) 8 MG tablet Take 1 tablet (8 mg total) by mouth every 8 (eight) hours as needed for nausea or vomiting. 30  tablet 0   phenytoin (DILANTIN) 100 MG ER capsule Take 200 mg by mouth 2 (two) times daily.     pravastatin (PRAVACHOL) 80 MG tablet Take 80 mg by mouth Daily.      prochlorperazine (COMPAZINE) 10 MG tablet Take 1 tablet (10 mg total) by mouth every 6 (six) hours as needed for nausea or vomiting. 30 tablet 0   timolol (BETIMOL) 0.5 % ophthalmic solution Place 1 drop into both eyes 2 (two) times daily.     traMADol (ULTRAM) 50 MG tablet Take 1 tablet (50 mg total) by mouth every 6 (six) hours as needed. 25 tablet 0  No current facility-administered medications for this visit.   Facility-Administered Medications Ordered in Other Visits  Medication Dose Route Frequency Provider Last Rate Last Admin   acetaminophen (TYLENOL) tablet 650 mg  650 mg Oral Once Orson Slick, MD       cyclophosphamide (CYTOXAN) 780 mg in sodium chloride 0.9 % 250 mL chemo infusion  400 mg/m2 (Treatment Plan Recorded) Intravenous Once Orson Slick, MD       diphenhydrAMINE (BENADRYL) capsule 50 mg  50 mg Oral Once Orson Slick, MD       DOXOrubicin (ADRIAMYCIN) chemo injection 48 mg  25 mg/m2 (Treatment Plan Recorded) Intravenous Once Orson Slick, MD       fosaprepitant (EMEND) 150 mg in sodium chloride 0.9 % 145 mL IVPB  150 mg Intravenous Once Ledell Peoples IV, MD       heparin lock flush 100 unit/mL  500 Units Intracatheter Once PRN Orson Slick, MD       palonosetron (ALOXI) injection 0.25 mg  0.25 mg Intravenous Once Orson Slick, MD       predniSONE (DELTASONE) tablet 60 mg  60 mg Oral Once Orson Slick, MD       riTUXimab-pvvr (RUXIENCE) 700 mg in sodium chloride 0.9 % 180 mL infusion  375 mg/m2 (Treatment Plan Recorded) Intravenous Once Orson Slick, MD       sodium chloride flush (NS) 0.9 % injection 10 mL  10 mL Intracatheter PRN Orson Slick, MD       vinCRIStine (ONCOVIN) 1 mg in sodium chloride 0.9 % 50 mL chemo infusion  1 mg Intravenous Once Orson Slick,  MD        REVIEW OF SYSTEMS:   Constitutional: ( - ) fevers, ( - )  chills , ( - ) night sweats Eyes: ( - ) blurriness of vision, ( - ) double vision, ( - ) watery eyes Ears, nose, mouth, throat, and face: ( - ) mucositis, ( - ) sore throat Respiratory: ( - ) cough, ( - ) dyspnea, ( - ) wheezes Cardiovascular: ( - ) palpitation, ( - ) chest discomfort, ( - ) lower extremity swelling Gastrointestinal:  ( - ) nausea, ( - ) heartburn, ( - ) change in bowel habits Skin: ( - ) abnormal skin rashes Lymphatics: ( - ) new lymphadenopathy, ( - ) easy bruising Neurological: ( - ) numbness, ( - ) tingling, ( - ) new weaknesses Behavioral/Psych: ( - ) mood change, ( - ) new changes  All other systems were reviewed with the patient and are negative.  PHYSICAL EXAMINATION: ECOG PERFORMANCE STATUS: 1 - Symptomatic but completely ambulatory  Vitals:   08/28/20 0814  BP: (!) 132/59  Pulse: 65  Resp: 17  Temp: (!) 96.9 F (36.1 C)  SpO2: 100%   Filed Weights   08/28/20 0814  Weight: 174 lb 9.6 oz (79.2 kg)    GENERAL: well appearing elderly Caucasian male in NAD  SKIN: skin color, texture, turgor are normal, no rashes or significant lesions EYES: conjunctiva are pink and non-injected, sclera clear NECK: supple, non-tender LYMPH:  Single palpable right sided submandibular lymph node. Otherwise no other marked palpable lymphadenopathy in the cervical, axillary or supraclavicular lymph nodes.  LUNGS: clear to auscultation and percussion with normal breathing effort HEART: regular rate & rhythm and no murmurs and no lower extremity edema Musculoskeletal: no cyanosis of digits  and no clubbing  PSYCH: alert & oriented x 3, fluent speech NEURO: no focal motor/sensory deficits  LABORATORY DATA:  I have reviewed the data as listed CBC Latest Ref Rng & Units 08/28/2020 08/14/2020 08/07/2020  WBC 4.0 - 10.5 K/uL 3.8(L) 6.4 5.1  Hemoglobin 13.0 - 17.0 g/dL 10.7(L) 12.5(L) 12.0(L)  Hematocrit 39.0 -  52.0 % 30.4(L) 36.0(L) 33.2(L)  Platelets 150 - 400 K/uL 156 147(L) 160    CMP Latest Ref Rng & Units 08/28/2020 08/07/2020 07/25/2020  Glucose 70 - 99 mg/dL 131(H) 98 132(H)  BUN 8 - 23 mg/dL 17 15 16   Creatinine 0.61 - 1.24 mg/dL 0.76 0.76 0.81  Sodium 135 - 145 mmol/L 142 141 141  Potassium 3.5 - 5.1 mmol/L 3.5 4.2 4.2  Chloride 98 - 111 mmol/L 108 106 105  CO2 22 - 32 mmol/L 25 25 28   Calcium 8.9 - 10.3 mg/dL 9.0 9.2 8.9  Total Protein 6.5 - 8.1 g/dL 6.7 6.9 6.9  Total Bilirubin 0.3 - 1.2 mg/dL 0.3 0.3 0.2(L)  Alkaline Phos 38 - 126 U/L 108 110 107  AST 15 - 41 U/L 13(L) 12(L) 16  ALT 0 - 44 U/L 11 10 14    RADIOGRAPHIC STUDIES: I have personally reviewed the radiological images as listed and agreed with the findings in the report: stable lymphadenopathy with exception of the rapidly enlarging submandibular lymph node.   NM PET Image Restag (PS) Skull Base To Thigh  Result Date: 08/23/2020 CLINICAL DATA:  Subsequent treatment strategy for follicular lymphoma. EXAM: NUCLEAR MEDICINE PET SKULL BASE TO THIGH TECHNIQUE: 8.7 mCi F-18 FDG was injected intravenously. Full-ring PET imaging was performed from the skull base to thigh after the radiotracer. CT data was obtained and used for attenuation correction and anatomic localization. Fasting blood glucose: 115 mg/dl COMPARISON:  December of 2021. FINDINGS: Mediastinal blood pool activity: SUV max 3.1 Liver activity: SUV max 4.48 NECK: RIGHT level IB lymph node has enlarged since the previous study. This now measures approximately 3.8 x 5.0 cm with a maximum SUV of 23.1 as compared to 14.2 on the prior study (image 37/4). Lymph nodes in the LEFT neck have resolved. Previous lymph node at the LEFT level II level that showed a maximum SUV of 10.3 posterior to the sternocleidomastoid on the prior study is no longer visible. No additional signs of nodal disease in the neck. Incidental CT findings: none CHEST: No hypermetabolic mediastinal or hilar  nodes. Scattered nodular foci show improvement since previous imaging. There is a stable nodule in the LEFT mid chest (image 45/8) 7 mm without significant FDG uptake maximum SUV less than 1. Other scattered small nodules in the chest many of which have resolved. In an area that previously showed branching tree-in-bud changes there is a focal nodule that measures 8 x 5 mm with a maximum SUV of 1.5. Airways are patent. Incidental CT findings: Calcified atheromatous plaque of the thoracic aorta. Normal heart size without pericardial effusion. Normal caliber of the central pulmonary vessels. RIGHT-sided Port-A-Cath terminates at the caval to atrial junction. Limited assessment of cardiovascular structures given lack of intravenous contrast. Esophagus mildly patulous. ABDOMEN/PELVIS: Resolution of small lymph node in the gastrohepatic recess otherwise negative appearance of the abdomen and pelvis, no signs of FDG avid disease. Incidental CT findings: Post cholecystectomy. Unremarkable appearance of liver, spleen, pancreas, adrenal glands and kidneys. Urinary bladder is collapsed. No hydronephrosis. Stable haziness of the jejunal mesentery, remains nonspecific with calcifications in the ileal mesentery. Calcified atheromatous plaque of  the abdominal aorta. No aneurysmal dilation. Prostate with enlargement as before. Small fat containing umbilical hernia and LEFT inguinal hernia. SKELETON: No focal hypermetabolic activity to suggest skeletal metastasis. Incidental CT findings: Spinal degenerative changes. IMPRESSION: Deauville 5 changes with marked enlargement of submandibular lymph node on the RIGHT with markedly increased metabolic activity over previous imaging. Resolution of LEFT neck lymph nodes and lymph node in the upper abdomen. These areas are no longer visible. No additional new or progressive findings. Scattered pulmonary nodules favored to represent post infectious or inflammatory changes. Attention on  follow-up. Stable haziness of jejunal mesentery. Attention on follow-up. Not associated with adenopathy or significant hypermetabolism. Electronically Signed   By: Zetta Bills M.D.   On: 08/23/2020 15:17   ECHOCARDIOGRAM COMPLETE  Result Date: 08/13/2020    ECHOCARDIOGRAM REPORT   Patient Name:   Brian Castillo Date of Exam: 08/13/2020 Medical Rec #:  [id number]       Height:       68.0 in Accession #:    [id number]      Weight:       175.4 lb Date of Birth:  1940-04-12        BSA:          1.933 m Patient Age:    45 years        BP:           141/57 mmHg Patient Gender: M               HR:           64 bpm. Exam Location:  Outpatient Procedure: 2D Echo, Cardiac Doppler, Color Doppler and Strain Analysis Indications:    Chemo Z09  History:        Patient has no prior history of Echocardiogram examinations.                 Risk Factors:Hypertension and Dyslipidemia.  Sonographer:    Bernadene Person RDCS Referring Phys: [id number] [place]  1. Left ventricular ejection fraction, by estimation, is 60 to 65%. The left ventricle has normal function. The left ventricle has no regional wall motion abnormalities. Left ventricular diastolic parameters are indeterminate.  2. Right ventricular systolic function is normal. The right ventricular size is normal.  3. The mitral valve is normal in structure. No evidence of mitral valve regurgitation.  4. The aortic valve is grossly normal. There is mild calcification of the aortic valve. Aortic valve regurgitation is not visualized. No aortic stenosis is present. FINDINGS  Left Ventricle: Left ventricular ejection fraction, by estimation, is 60 to 65%. The left ventricle has normal function. The left ventricle has no regional wall motion abnormalities. The left ventricular internal cavity size was normal in size. There is  no left ventricular hypertrophy. Left ventricular diastolic parameters are indeterminate. Right Ventricle: The right ventricular size is  normal. Right vetricular wall thickness was not well visualized. Right ventricular systolic function is normal. Left Atrium: Left atrial size was normal in size. Right Atrium: Right atrial size was normal in size. Pericardium: There is no evidence of pericardial effusion. Mitral Valve: The mitral valve is normal in structure. No evidence of mitral valve regurgitation. Tricuspid Valve: The tricuspid valve is grossly normal. Tricuspid valve regurgitation is trivial. Aortic Valve: The aortic valve is grossly normal. There is mild calcification of the aortic valve. Aortic valve regurgitation is not visualized. No aortic stenosis is present. Pulmonic Valve: The pulmonic valve was normal in structure.  Pulmonic valve regurgitation is trivial. Aorta: The aortic root and ascending aorta are structurally normal, with no evidence of dilitation. IAS/Shunts: The atrial septum is grossly normal.  LEFT VENTRICLE PLAX 2D LVIDd:         5.00 cm  Diastology LVIDs:         3.00 cm  LV e' medial:    5.15 cm/s LV PW:         0.90 cm  LV E/e' medial:  14.1 LV IVS:        0.90 cm  LV e' lateral:   5.63 cm/s LVOT diam:     2.10 cm  LV E/e' lateral: 12.9 LV SV:         79 LV SV Index:   41       2D Longitudinal Strain LVOT Area:     3.46 cm 2D Strain GLS Avg:     -24.6 %  RIGHT VENTRICLE TAPSE (M-mode): 2.3 cm LEFT ATRIUM             Index       RIGHT ATRIUM           Index LA diam:        4.60 cm 2.38 cm/m  RA Area:     13.20 cm LA Vol (A2C):   47.2 ml 24.42 ml/m RA Volume:   27.00 ml  13.97 ml/m LA Vol (A4C):   54.0 ml 27.94 ml/m LA Biplane Vol: 52.1 ml 26.95 ml/m  AORTIC VALVE LVOT Vmax:   113.00 cm/s LVOT Vmean:  70.600 cm/s LVOT VTI:    0.227 m  AORTA Ao Root diam: 3.70 cm Ao Asc diam:  3.30 cm MITRAL VALVE MV Area (PHT): 2.80 cm    SHUNTS MV Decel Time: 271 msec    Systemic VTI:  0.23 m MV E velocity: 72.40 cm/s  Systemic Diam: 2.10 cm MV A velocity: 74.10 cm/s MV E/A ratio:  0.98 Mertie Moores MD Electronically signed by  Mertie Moores MD Signature Date/Time: 08/13/2020/6:06:16 PM    Final    IR IMAGING GUIDED PORT INSERTION  Result Date: 08/14/2020 INDICATION: 80 year old male with history of recurrent follicular lymphoma requiring central venous access for chemotherapy. EXAM: IMPLANTED PORT A CATH PLACEMENT WITH ULTRASOUND AND FLUOROSCOPIC GUIDANCE COMPARISON:  None. MEDICATIONS: None. ANESTHESIA/SEDATION: Moderate (conscious) sedation was employed during this procedure. A total of Versed 2 mg and Fentanyl 75 mcg was administered intravenously. Moderate Sedation Time: 15 minutes. The patient's level of consciousness and vital signs were monitored continuously by radiology nursing throughout the procedure under my direct supervision. CONTRAST:  None FLUOROSCOPY TIME:  0 minutes, 12 seconds (2 mGy) COMPLICATIONS: None immediate. PROCEDURE: The procedure, risks, benefits, and alternatives were explained to the patient. Questions regarding the procedure were encouraged and answered. The patient understands and consents to the procedure. The right neck and chest were prepped with chlorhexidine in a sterile fashion, and a sterile drape was applied covering the operative field. Maximum barrier sterile technique with sterile gowns and gloves were used for the procedure. A timeout was performed prior to the initiation of the procedure. Ultrasound was used to examine the jugular vein which was compressible and free of internal echoes. A skin marker was used to demarcate the planned venotomy and port pocket incision sites. Local anesthesia was provided to these sites and the subcutaneous tunnel track with 1% lidocaine with 1:100,000 epinephrine. A small incision was created at the jugular access site and blunt dissection was performed of the  subcutaneous tissues. Under ultrasound guidance, the jugular vein was accessed with a 21 ga micropuncture needle and an 0.018" wire was inserted to the superior vena cava. Real-time ultrasound guidance  was utilized for vascular access including the acquisition of a permanent ultrasound image documenting patency of the accessed vessel. A 5 Fr micopuncture set was then used, through which a 0.035" Rosen wire was passed under fluoroscopic guidance into the inferior vena cava. An 8 Fr dilator was then placed over the wire. A subcutaneous port pocket was then created along the upper chest wall utilizing a combination of sharp and blunt dissection. The pocket was irrigated with sterile saline, packed with gauze, and observed for hemorrhage. A single lumen "ISP" sized power injectable port was chosen for placement. The 8 Fr catheter was tunneled from the port pocket site to the venotomy incision. The port was placed in the pocket. The external catheter was trimmed to appropriate length. The dilator was exchanged for an 8 Fr peel-away sheath under fluoroscopic guidance. The catheter was then placed through the sheath and the sheath was removed. Final catheter positioning was confirmed and documented with a fluoroscopic spot radiograph. The port was accessed with a Huber needle, aspirated, and flushed with heparinized saline. The deep dermal layer of the port pocket incision was closed with interrupted 3-0 Vicryl suture. Dermabond was then placed over the port pocket and neck incisions. The patient tolerated the procedure well without immediate post procedural complication. FINDINGS: After catheter placement, the tip lies within the superior cavoatrial junction. The catheter aspirates and flushes normally and is ready for immediate use. IMPRESSION: Successful placement of a power injectable Port-A-Cath via the right internal jugular vein. The catheter is ready for immediate use. Ruthann Cancer, MD [facility] Electronically Signed   By: Ruthann Cancer MD   On: 08/14/2020 15:11   IR Radiologist Eval & Mgmt  Result Date: 08/20/2020 Please refer to notes tab for  details about interventional procedure. (Op Note)   ASSESSMENT & PLAN Brian Castillo 80 y.o. 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 The patient's last visit was on 02/12/2020 at which time he establish care.  After review the labs, the records, discussion with the patient the findings are most consistent with a stage III follicular lymphoma based on the PET CT scan.  There are lymph nodes on both sides of diaphragm and therefore stage III would be the most appropriate designation.    Previously we discussed the nature of follicular lymphoma and the treatment options moving forward.  The options at this time would be observation, immunotherapy, or combination of chemotherapy and immunotherapy.  Given the lack of symptoms, cytopenias, and bulky disease at the time of presentation we decided to proceed with observation alone. Unfortunately due to rapid progression we need to consider treatment moving forward.  Additionally we discussed rituximab monotherapy with weekly dosing x4 doses followed by every 2 months dosing x4 as a treatment option as well as bendamustine and rituximab on days 1 and 2 of a 28-day cycle x6.  We discussed the side effects, risks and benefits, and possible outcomes of treatment.  The patient voiced his understanding of these options and wished to proceed with R-Benda.   Over the period of one month the patient was found to have a rapidly enlarging right submandibular lymph node.  Due to concern for this enlarging lymph node we ordered a CT scan of  the chest abdomen pelvis to assess for progression elsewhere, but that was the only lymph node that was progressing. Biopsy with ENT showed no progression into a DLBCL or other aggressive variant.  On PET CT scan from 08/23/2020 the patient showed complete response of disease with every site with the exception of his right cervical lymph node which is increasing in size and  brightness.  As such we have to discontinue first-line therapy with rituximab and Bendamustine and transition over to second line R mini CHOP.  R mini CHOP chemotherapy Cycle 1 Day1 started on 08/28/2020.  #Follicular lymphoma, Stage III --new baseline PET CT scan unfortunately showed progression of the right cervical lymph node.  We are currently planning for 2nd line therapy with R-miniCHOP(which would also be effective if patient is having transformation of the tumor).  -- patient completed PET, Port, and TTE prior to start of R-miniCHOP. --prior PET CT findings are consistent with a Stage III follicular lymphoma. Due to rapidly enlarging submandibular lymph node we proceeded with treatment.  --imaging shows stable disease save for a single enlarging submandibular lymph node.  Prior biopsy has not shown transformation into a more aggressive lymphoma.  --after discussion with the patient, he was agreeable to starting Bendamustine/Rituximab as first line treatment.  --started Cycle 1 Day 1 of R-Benda chemotherapy on 06/12/2020 --completed Cycle 3 Day of R-Benda. Unfortunately he is having progression for which we are changing therapy to R-miniCHOP.  --08/28/2020 is Day 1 of Cycle 1 of R-miniCHOP --RTC in 3 week to start Cycle 2 Day 1 of R-miniCHOP with interval weekly labs  #Pain Control -- Patient is having worsening pain in his right cervical lymph node which is interfering with sleep. --Continue tramadol 50 to 100 mg every 6 hours --If the tramadol is ineffective would recommend oxycodone 5 mg p.o. every 6 hours --Continue to monitor  #Medication Interaction --Allopurinol increases the levels of Dilantin. --We will work with the patient's primary care provider Dr. Shon Baton to monitor his Dilantin levels and adjust accordingly. For now we are following this weekly.  #Fever, resolved --Single episode of fever up to 101 F occurred on 06/25/2020. --Patient was not neutropenic at that time and  was evaluated emergency department with infectious work-up including chest x-ray, blood cultures, and urine culture.  Data at that time showed no evidence of infectious disease. --No further fevers noted since that time.  We will continue to monitor.  #Supportive Care --chemotherapy education completed --zofran 8mg  q8H PRN and compazine 10mg  PO q6H for nausea --acyclovir 400mg  PO BID for VCZ prophylaxis -- allopurinol 300mg  PO daily for TLS prophylaxis -- port in place -- no pain medication required at this time.    No orders of the defined types were placed in this encounter.  All questions were answered. The patient knows to call the clinic with any problems, questions or concerns.  A total of more than 30 minutes were spent on this encounter and over half of that time was spent on counseling and coordination of care as outlined above.   Ledell Peoples, MD Department of Hematology/Oncology [place] at [facility], [place] Phone: [phone] Pager: [phone] Email: [email or web address]  08/28/2020 9:13 AM

## 2020-08-29 ENCOUNTER — Inpatient Hospital Stay: Payer: Medicare Other

## 2020-08-29 LAB — PHENYTOIN LEVEL, FREE AND TOTAL
Phenytoin, Free: 0.9 ug/mL — ABNORMAL LOW (ref 1.0–2.0)
Phenytoin, Total: 8.8 ug/mL — ABNORMAL LOW (ref 10.0–20.0)

## 2020-08-30 ENCOUNTER — Other Ambulatory Visit: Payer: Self-pay

## 2020-08-30 ENCOUNTER — Inpatient Hospital Stay: Payer: Medicare Other

## 2020-08-30 DIAGNOSIS — C8251 Diffuse follicle center lymphoma, lymph nodes of head, face, and neck: Secondary | ICD-10-CM

## 2020-08-30 DIAGNOSIS — C8511 Unspecified B-cell lymphoma, lymph nodes of head, face, and neck: Secondary | ICD-10-CM | POA: Diagnosis not present

## 2020-08-30 MED ORDER — PEGFILGRASTIM-BMEZ 6 MG/0.6ML ~~LOC~~ SOSY
PREFILLED_SYRINGE | SUBCUTANEOUS | Status: AC
  Filled 2020-08-30: qty 0.6

## 2020-08-30 MED ORDER — PEGFILGRASTIM-BMEZ 6 MG/0.6ML ~~LOC~~ SOSY
6.0000 mg | PREFILLED_SYRINGE | Freq: Once | SUBCUTANEOUS | Status: AC
  Administered 2020-08-30: 6 mg via SUBCUTANEOUS

## 2020-08-30 NOTE — Patient Instructions (Signed)

## 2020-09-04 ENCOUNTER — Other Ambulatory Visit: Payer: Self-pay

## 2020-09-04 ENCOUNTER — Inpatient Hospital Stay: Payer: Medicare Other

## 2020-09-04 DIAGNOSIS — C8511 Unspecified B-cell lymphoma, lymph nodes of head, face, and neck: Secondary | ICD-10-CM | POA: Diagnosis not present

## 2020-09-04 DIAGNOSIS — C8251 Diffuse follicle center lymphoma, lymph nodes of head, face, and neck: Secondary | ICD-10-CM

## 2020-09-04 DIAGNOSIS — Z95828 Presence of other vascular implants and grafts: Secondary | ICD-10-CM | POA: Insufficient documentation

## 2020-09-04 LAB — CBC WITH DIFFERENTIAL (CANCER CENTER ONLY)
Abs Immature Granulocytes: 0 10*3/uL (ref 0.00–0.07)
Band Neutrophils: 10 %
Basophils Absolute: 0.1 10*3/uL (ref 0.0–0.1)
Basophils Relative: 2 %
Eosinophils Absolute: 0.6 10*3/uL — ABNORMAL HIGH (ref 0.0–0.5)
Eosinophils Relative: 20 %
HCT: 29.3 % — ABNORMAL LOW (ref 39.0–52.0)
Hemoglobin: 10.4 g/dL — ABNORMAL LOW (ref 13.0–17.0)
Lymphocytes Relative: 18 %
Lymphs Abs: 0.5 10*3/uL — ABNORMAL LOW (ref 0.7–4.0)
MCH: 32.6 pg (ref 26.0–34.0)
MCHC: 35.5 g/dL (ref 30.0–36.0)
MCV: 91.8 fL (ref 80.0–100.0)
Metamyelocytes Relative: 1 %
Monocytes Absolute: 0.4 10*3/uL (ref 0.1–1.0)
Monocytes Relative: 14 %
Neutro Abs: 1.3 10*3/uL — ABNORMAL LOW (ref 1.7–7.7)
Neutrophils Relative %: 35 %
Platelet Count: 95 10*3/uL — ABNORMAL LOW (ref 150–400)
RBC: 3.19 MIL/uL — ABNORMAL LOW (ref 4.22–5.81)
RDW: 12.8 % (ref 11.5–15.5)
WBC Count: 2.8 10*3/uL — ABNORMAL LOW (ref 4.0–10.5)
nRBC: 0 % (ref 0.0–0.2)

## 2020-09-04 LAB — CMP (CANCER CENTER ONLY)
ALT: 7 U/L (ref 0–44)
AST: 12 U/L — ABNORMAL LOW (ref 15–41)
Albumin: 3.4 g/dL — ABNORMAL LOW (ref 3.5–5.0)
Alkaline Phosphatase: 90 U/L (ref 38–126)
Anion gap: 10 (ref 5–15)
BUN: 11 mg/dL (ref 8–23)
CO2: 25 mmol/L (ref 22–32)
Calcium: 9 mg/dL (ref 8.9–10.3)
Chloride: 106 mmol/L (ref 98–111)
Creatinine: 0.69 mg/dL (ref 0.61–1.24)
GFR, Estimated: >60 mL/min (ref >60)
Glucose, Bld: 103 mg/dL — ABNORMAL HIGH (ref 70–99)
Potassium: 3.9 mmol/L (ref 3.5–5.1)
Sodium: 141 mmol/L (ref 135–145)
Total Bilirubin: 0.4 mg/dL (ref 0.3–1.2)
Total Protein: 6.5 g/dL (ref 6.5–8.1)

## 2020-09-04 LAB — LACTATE DEHYDROGENASE: LDH: 188 U/L (ref 98–192)

## 2020-09-04 LAB — URIC ACID: Uric Acid, Serum: 4.8 mg/dL (ref 3.7–8.6)

## 2020-09-04 MED ORDER — SODIUM CHLORIDE 0.9% FLUSH
10.0000 mL | Freq: Once | INTRAVENOUS | Status: AC
  Administered 2020-09-04: 10 mL
  Filled 2020-09-04: qty 10

## 2020-09-04 MED ORDER — HEPARIN SOD (PORK) LOCK FLUSH 100 UNIT/ML IV SOLN
500.0000 [IU] | Freq: Once | INTRAVENOUS | Status: AC
Start: 2020-09-04 — End: 2020-09-04
  Administered 2020-09-04: 500 [IU]
  Filled 2020-09-04: qty 5

## 2020-09-05 ENCOUNTER — Telehealth: Payer: Self-pay | Admitting: *Deleted

## 2020-09-05 LAB — PHENYTOIN LEVEL, FREE AND TOTAL
Phenytoin, Free: 0.9 ug/mL — ABNORMAL LOW (ref 1.0–2.0)
Phenytoin, Total: 11.5 ug/mL (ref 10.0–20.0)

## 2020-09-05 NOTE — Telephone Encounter (Signed)
Received vm message from patient . He states he feels like he got  'run over by a truck' after getting pegfilgrastin injection on 08/30/20  [name] patient and spoke with him. Advised that he should start feeling better in the next 2 -3 days but to try alternating ibuprofen with his Tramadol ATC to see if that will help.  He has only been using Tylenol. He voiced understanding and will try the ibuprofen and tramadol.  He does say the lymph node under his chen and right jaw is significantly smaller after his 1st treatment of R mini-chop.   He knows to call with any other questions or concerns.

## 2020-09-08 ENCOUNTER — Other Ambulatory Visit: Payer: Self-pay

## 2020-09-08 ENCOUNTER — Emergency Department (HOSPITAL_BASED_OUTPATIENT_CLINIC_OR_DEPARTMENT_OTHER)
Admission: EM | Admit: 2020-09-08 | Discharge: 2020-09-08 | Disposition: A | Discharge status: Home / Self Care | Payer: Medicare Other | Attending: Emergency Medicine | Admitting: Emergency Medicine

## 2020-09-08 ENCOUNTER — Encounter (HOSPITAL_BASED_OUTPATIENT_CLINIC_OR_DEPARTMENT_OTHER): Payer: Self-pay

## 2020-09-08 DIAGNOSIS — Z79899 Other long term (current) drug therapy: Secondary | ICD-10-CM | POA: Diagnosis not present

## 2020-09-08 DIAGNOSIS — M542 Cervicalgia: Secondary | ICD-10-CM | POA: Diagnosis present

## 2020-09-08 DIAGNOSIS — I10 Essential (primary) hypertension: Secondary | ICD-10-CM | POA: Diagnosis not present

## 2020-09-08 DIAGNOSIS — C8291 Follicular lymphoma, unspecified, lymph nodes of head, face, and neck: Secondary | ICD-10-CM

## 2020-09-08 DIAGNOSIS — Z7982 Long term (current) use of aspirin: Secondary | ICD-10-CM | POA: Insufficient documentation

## 2020-09-08 DIAGNOSIS — Z87891 Personal history of nicotine dependence: Secondary | ICD-10-CM | POA: Diagnosis not present

## 2020-09-08 HISTORY — DX: Malignant (primary) neoplasm, unspecified: C80.1

## 2020-09-08 MED ORDER — MORPHINE SULFATE (PF) 4 MG/ML IV SOLN
4.0000 mg | Freq: Once | INTRAVENOUS | Status: AC
  Administered 2020-09-08: 4 mg via INTRAMUSCULAR
  Filled 2020-09-08: qty 1

## 2020-09-08 MED ORDER — DIAZEPAM 5 MG PO TABS: 5.0000 mg | ORAL_TABLET | Freq: Three times a day (TID) | ORAL | 0 refills | Status: DC | PRN

## 2020-09-08 MED ORDER — KETOROLAC TROMETHAMINE 60 MG/2ML IM SOLN
30.0000 mg | Freq: Once | INTRAMUSCULAR | Status: AC
  Administered 2020-09-08: 30 mg via INTRAMUSCULAR
  Filled 2020-09-08: qty 2

## 2020-09-08 MED ORDER — LORAZEPAM 2 MG/ML IJ SOLN
1.0000 mg | Freq: Once | INTRAMUSCULAR | Status: AC
  Administered 2020-09-08: 1 mg via INTRAMUSCULAR
  Filled 2020-09-08: qty 1

## 2020-09-08 NOTE — ED Provider Notes (Signed)
[facility] DEPT Provider Note   CSN: [id number] Arrival date & time: 09/08/20  1039     History Chief Complaint  Patient presents with   Neck Pain    Brian Castillo is a 80 y.o. male.  Pt presents to the ED today with neck pain.  He has a hx of stage III folicular lymphoma.  He had a PET scan which showed progression of his right cervical lymph node.  He started R-miniCHOP on 6/15.  He received a pegfilgrastim injection on 6/17.  He has felt really bad since the pefgilgrastim injection.  Pt has a lot of pain in the back of his neck.  He's been taking tramadol prn and this morning he took a 5 mg oxycodone.  He is still having a lot of pain.        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    Past Surgical History:  Procedure Laterality Date   CARPOMETACARPEL SUSPENSION PLASTY Right 08/24/2014   Procedure: RIGHT CMC ARTHOPLASTY WITH DOUBLE TENDON TRANSFER AND REPAIR  RECONSTRUCTION ;  Surgeon: Roseanne Kaufman, MD;  Location: [facility];  Service: Orthopedics;  Laterality: Right;   CHOLECYSTECTOMY     COLONOSCOPY     IR IMAGING GUIDED PORT INSERTION  08/14/2020   IR RADIOLOGIST EVAL & MGMT  08/20/2020   KNEE ARTHROSCOPY  2003   bilateral   POLYPECTOMY         Family History  Problem Relation Age of Onset   Colon cancer Neg Hx    Colon polyps Neg Hx     Social History   Tobacco Use   Smoking status: Former    Pack  years: 0.00   Smokeless tobacco: Never   Tobacco comments:    quit 1984  Substance Use Topics   Alcohol use: Yes    Alcohol/week: 14.0 standard drinks    Types: 14 Glasses of wine per week   Drug use: No    Home Medications Prior to Admission medications   Medication Sig Start Date End Date Taking? Authorizing Provider  diazepam (VALIUM) 5 MG tablet Take 1 tablet (5 mg total) by mouth every 8 (eight) hours as needed for anxiety. 09/08/20  Yes Isla Pence, MD  acyclovir (ZOVIRAX) 400 MG tablet Take 1 tablet (400 mg total) by mouth 2 (two) times daily. 05/29/20   Orson Slick, MD  amLODipine (NORVASC) 5 MG tablet Take 5 mg by mouth Daily.  11/28/11   [provider]  aspirin 81 MG tablet Take 81 mg by mouth daily.    [provider]  Calcium Carb-Cholecalciferol (CALCIUM 600 + D PO) Take 1 tablet by mouth daily.    [provider]  cholecalciferol (VITAMIN D) 1000 units tablet Take 1,000 Units by mouth daily.  [provider]  COVID-19 mRNA Vac-TriS, Pfizer, (PFIZER-BIONT COVID-19 VAC-TRIS) SUSP injection Inject into the muscle. 08/16/20   Carlyle Basques, MD  dexamethasone (DECADRON) 4 MG tablet Take 2 tablets (8 mg total) by mouth as directed. Take 2 pills (8mg ) twice daily on Days 3 and 4 of treatment 05/29/20   Orson Slick, MD  Latanoprostene Bunod (VYZULTA) 0.024 % SOLN Place 1 drop into both eyes at bedtime.    [provider]  lidocaine-prilocaine (EMLA) cream Apply 1 application topically as needed. 08/28/20   Orson Slick, MD  metoprolol succinate (TOPROL-XL) 25 MG 24 hr tablet Take 25 mg by mouth Daily.  11/28/11   [provider]  ondansetron (ZOFRAN) 8 MG tablet Take 1 tablet (8 mg total) by mouth every 8 (eight) hours as needed for nausea or vomiting. 05/29/20   Orson Slick, MD  oxyCODONE (OXY IR/ROXICODONE) 5 MG immediate release tablet Take 1 tablet (5 mg total) by mouth every 6 (six) hours as needed for  severe pain. 08/28/20   Orson Slick, MD  phenytoin (DILANTIN) 100 MG ER capsule Take 200 mg by mouth 2 (two) times daily.    [provider]  pravastatin (PRAVACHOL) 80 MG tablet Take 80 mg by mouth Daily.  11/28/11   [provider]  predniSONE (DELTASONE) 20 MG tablet Take 3 tablets (60 mg total) by mouth as directed. Take 3 tablets (60mg ) by mouth daily starting on Chemotherapy Day 1 and continuing for 4 days afterwards. 08/28/20   Orson Slick, MD  prochlorperazine (COMPAZINE) 10 MG tablet Take 1 tablet (10 mg total) by mouth every 6 (six) hours as needed for nausea or vomiting. 05/29/20   Orson Slick, MD  timolol (BETIMOL) 0.5 % ophthalmic solution Place 1 drop into both eyes 2 (two) times daily.    [provider]  traMADol (ULTRAM) 50 MG tablet Take 1 tablet (50 mg total) by mouth every 6 (six) hours as needed. 08/21/20   Orson Slick, MD    Allergies    Brimonidine tartrate-timolol, Lisinopril, and Netarsudil dimesylate  Review of Systems   Review of Systems  Musculoskeletal:  Positive for neck pain.  All other systems reviewed and are negative.  Physical Exam Updated Vital Signs BP (!) 141/62 (BP Location: Left Arm)   Pulse 78   Temp 98.4 F (36.9 C) (Oral)   Resp 18   SpO2 98%   Physical Exam Vitals and nursing note reviewed.  Constitutional:      Appearance: Normal appearance.  HENT:     Head: Normocephalic and atraumatic.     Right Ear: External ear normal.     Left Ear: External ear normal.     Nose: Nose normal.     Mouth/Throat:     Mouth: Mucous membranes are moist.     Pharynx: Oropharynx is clear.  Eyes:     Extraocular Movements: Extraocular movements intact.     Conjunctiva/sclera: Conjunctivae normal.     Pupils: Pupils are equal, round, and reactive to light.  Neck:     Comments: Large right cervical LN  Posterior neck spasm with decreased rom Cardiovascular:     Rate and Rhythm: Normal rate and regular  rhythm.     Pulses: Normal pulses.     Heart sounds: Normal heart sounds.  Pulmonary:     Effort: Pulmonary effort is normal.     Breath sounds: Normal breath sounds.  Abdominal:     General: Abdomen is flat. Bowel sounds are normal.     Palpations: Abdomen is soft.  Musculoskeletal:        General: Normal range of motion.  Skin:    General: Skin is warm.     Capillary Refill: Capillary refill takes less than 2 seconds.  Neurological:     General: No focal deficit present.     Mental Status: He is alert and oriented to person, place, and time.  Psychiatric:        Mood and Affect: Mood normal.        Behavior: Behavior normal.        Thought Content: Thought content normal.        Judgment: Judgment normal.    ED Results / Procedures / Treatments   Labs (all labs ordered are listed, but only abnormal results are displayed) Labs Reviewed - No data to display  EKG None  Radiology No results found.  Procedures Procedures   Medications Ordered in ED Medications  LORazepam (ATIVAN) injection 1 mg (1 mg Intramuscular Given 09/08/20 1220)  morphine 4 MG/ML injection 4 mg (4 mg Intramuscular Given 09/08/20 1220)  ketorolac (TORADOL) injection 30 mg (30 mg Intramuscular Given 09/08/20 1221)    ED Course  I have reviewed the triage vital signs and the nursing notes.  Pertinent labs & imaging results that were available during my care of the patient were reviewed by me and considered in my medical decision making (see chart for details).    MDM Rules/Calculators/A&P                          Pain is tolerable now.  He does not want anything else for pain here.  He said he has enough oxycodone to last him for the next few days.  I will add valium.  He is to f/u with his oncologist.  Return if worse.  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

## 2020-09-08 NOTE — ED Triage Notes (Signed)
He states that he recently received a bone marrow stimulant injection. He is here today with c/o non-traumatic neck pain. He is alert and in no distress.

## 2020-09-08 NOTE — Discharge Instructions (Addendum)
Take Oxycodone every 6 hours as needed for pain. Take the Valium every 8 hours as needed for pain.

## 2020-09-09 ENCOUNTER — Other Ambulatory Visit: Payer: Self-pay | Admitting: *Deleted

## 2020-09-09 ENCOUNTER — Telehealth: Payer: Self-pay | Admitting: *Deleted

## 2020-09-09 MED ORDER — CYCLOBENZAPRINE HCL 5 MG PO TABS: 5.0000 mg | ORAL_TABLET | Freq: Three times a day (TID) | ORAL | 0 refills | Status: DC | PRN

## 2020-09-09 NOTE — Telephone Encounter (Signed)
Received call from patient regarding ongoing sever neck pain he is experiencing over the weekend and the end of last week.  He is cc/o very severe neck pain in the back of his neck and radiates to the left side. It not involving the enlarged lymph node in his right neck area.  That lymph node has decreased significantly since his last chemo. The neck pain started after his port placement. The port was evaluated by IR on 08/20/20. Pt also received Pegfilgratsin 1 week ago.  Pt states he went to the ED yesterday as the pain was so severe. He cannot sleep at night. Pain was not relieved with oxycodone at home.  IN the Ed he received Ativan, Morphine and toradol.  He states the pain was releived for a while but came back just as severe. He was given a prescription for Valium 5 mg po Q8 hours. He states the Valium helped only a little bit and quickly wore off.  Spoke with Dr. Lorenso Courier, reviewed the above with him.  He recommended Ibuprofen 800mg  q 8 hours along with the Oxycodone 5mg . Pt instructed that he could take 2 tablets of oxycodone at bedtime.  Also received order for flexeril 5mg  tablets every 8 hours, in place of the valium. Instructed patient on the above and he voiced understanding. Also scheduled a f/u appt with Dr. Lorenso Courier after pt's labs on 09/11/20.  Pt is in agreement with this.  Pt and his wife know to call with any changes or no improvement in pain status

## 2020-09-11 ENCOUNTER — Inpatient Hospital Stay (HOSPITAL_BASED_OUTPATIENT_CLINIC_OR_DEPARTMENT_OTHER): Payer: Medicare Other | Admitting: Hematology and Oncology

## 2020-09-11 ENCOUNTER — Inpatient Hospital Stay: Payer: Medicare Other

## 2020-09-11 ENCOUNTER — Emergency Department (HOSPITAL_COMMUNITY): Payer: Medicare Other

## 2020-09-11 ENCOUNTER — Emergency Department (HOSPITAL_COMMUNITY)
Admission: EM | Admit: 2020-09-11 | Discharge: 2020-09-11 | Disposition: A | Discharge status: Home / Self Care | Payer: Medicare Other | Source: Home / Self Care | Attending: Emergency Medicine | Admitting: Emergency Medicine

## 2020-09-11 ENCOUNTER — Other Ambulatory Visit: Payer: Self-pay

## 2020-09-11 ENCOUNTER — Encounter (HOSPITAL_COMMUNITY): Payer: Self-pay

## 2020-09-11 VITALS — BP 118/60 | HR 80 | Temp 97.6°F | Resp 19 | Ht 68.0 in | Wt 173.0 lb

## 2020-09-11 DIAGNOSIS — Z87891 Personal history of nicotine dependence: Secondary | ICD-10-CM | POA: Insufficient documentation

## 2020-09-11 DIAGNOSIS — C8251 Diffuse follicle center lymphoma, lymph nodes of head, face, and neck: Secondary | ICD-10-CM

## 2020-09-11 DIAGNOSIS — Z95828 Presence of other vascular implants and grafts: Secondary | ICD-10-CM

## 2020-09-11 DIAGNOSIS — R519 Headache, unspecified: Secondary | ICD-10-CM | POA: Insufficient documentation

## 2020-09-11 DIAGNOSIS — M542 Cervicalgia: Secondary | ICD-10-CM

## 2020-09-11 DIAGNOSIS — Z7982 Long term (current) use of aspirin: Secondary | ICD-10-CM | POA: Insufficient documentation

## 2020-09-11 DIAGNOSIS — Z8572 Personal history of non-Hodgkin lymphomas: Secondary | ICD-10-CM | POA: Insufficient documentation

## 2020-09-11 DIAGNOSIS — Z79899 Other long term (current) drug therapy: Secondary | ICD-10-CM | POA: Insufficient documentation

## 2020-09-11 DIAGNOSIS — I10 Essential (primary) hypertension: Secondary | ICD-10-CM | POA: Insufficient documentation

## 2020-09-11 LAB — COMPREHENSIVE METABOLIC PANEL
ALT: 19 U/L (ref 0–44)
AST: 16 U/L (ref 15–41)
Albumin: 3.8 g/dL (ref 3.5–5.0)
Alkaline Phosphatase: 106 U/L (ref 38–126)
Anion gap: 8 (ref 5–15)
BUN: 12 mg/dL (ref 8–23)
CO2: 27 mmol/L (ref 22–32)
Calcium: 9.1 mg/dL (ref 8.9–10.3)
Chloride: 100 mmol/L (ref 98–111)
Creatinine, Ser: 0.64 mg/dL (ref 0.61–1.24)
GFR, Estimated: >60 mL/min (ref >60)
Glucose, Bld: 120 mg/dL — ABNORMAL HIGH (ref 70–99)
Potassium: 4.7 mmol/L (ref 3.5–5.1)
Sodium: 135 mmol/L (ref 135–145)
Total Bilirubin: 0.6 mg/dL (ref 0.3–1.2)
Total Protein: 7.5 g/dL (ref 6.5–8.1)

## 2020-09-11 LAB — CMP (CANCER CENTER ONLY)
ALT: 15 U/L (ref 0–44)
AST: 14 U/L — ABNORMAL LOW (ref 15–41)
Albumin: 3.1 g/dL — ABNORMAL LOW (ref 3.5–5.0)
Alkaline Phosphatase: 126 U/L (ref 38–126)
Anion gap: 10 (ref 5–15)
BUN: 12 mg/dL (ref 8–23)
CO2: 24 mmol/L (ref 22–32)
Calcium: 9.3 mg/dL (ref 8.9–10.3)
Chloride: 101 mmol/L (ref 98–111)
Creatinine: 0.81 mg/dL (ref 0.61–1.24)
GFR, Estimated: >60 mL/min (ref >60)
Glucose, Bld: 117 mg/dL — ABNORMAL HIGH (ref 70–99)
Potassium: 4.2 mmol/L (ref 3.5–5.1)
Sodium: 135 mmol/L (ref 135–145)
Total Bilirubin: 0.5 mg/dL (ref 0.3–1.2)
Total Protein: 7.1 g/dL (ref 6.5–8.1)

## 2020-09-11 LAB — CBC WITH DIFFERENTIAL (CANCER CENTER ONLY)
Abs Immature Granulocytes: 0.11 10*3/uL — ABNORMAL HIGH (ref 0.00–0.07)
Basophils Absolute: 0.1 10*3/uL (ref 0.0–0.1)
Basophils Relative: 0 %
Eosinophils Absolute: 0.2 10*3/uL (ref 0.0–0.5)
Eosinophils Relative: 2 %
HCT: 29.1 % — ABNORMAL LOW (ref 39.0–52.0)
Hemoglobin: 10.3 g/dL — ABNORMAL LOW (ref 13.0–17.0)
Immature Granulocytes: 1 %
Lymphocytes Relative: 2 %
Lymphs Abs: 0.2 10*3/uL — ABNORMAL LOW (ref 0.7–4.0)
MCH: 33.8 pg (ref 26.0–34.0)
MCHC: 35.4 g/dL (ref 30.0–36.0)
MCV: 95.4 fL (ref 80.0–100.0)
Monocytes Absolute: 0.7 10*3/uL (ref 0.1–1.0)
Monocytes Relative: 6 %
Neutro Abs: 10.2 10*3/uL — ABNORMAL HIGH (ref 1.7–7.7)
Neutrophils Relative %: 89 %
Platelet Count: 114 10*3/uL — ABNORMAL LOW (ref 150–400)
RBC: 3.05 MIL/uL — ABNORMAL LOW (ref 4.22–5.81)
RDW: 14.1 % (ref 11.5–15.5)
WBC Count: 11.5 10*3/uL — ABNORMAL HIGH (ref 4.0–10.5)
nRBC: 0 % (ref 0.0–0.2)

## 2020-09-11 LAB — CBC WITH DIFFERENTIAL/PLATELET
Abs Immature Granulocytes: 0.13 10*3/uL — ABNORMAL HIGH (ref 0.00–0.07)
Basophils Absolute: 0.1 10*3/uL (ref 0.0–0.1)
Basophils Relative: 0 %
Eosinophils Absolute: 0.3 10*3/uL (ref 0.0–0.5)
Eosinophils Relative: 3 %
HCT: 30.8 % — ABNORMAL LOW (ref 39.0–52.0)
Hemoglobin: 10.8 g/dL — ABNORMAL LOW (ref 13.0–17.0)
Immature Granulocytes: 1 %
Lymphocytes Relative: 2 %
Lymphs Abs: 0.2 10*3/uL — ABNORMAL LOW (ref 0.7–4.0)
MCH: 34.2 pg — ABNORMAL HIGH (ref 26.0–34.0)
MCHC: 35.1 g/dL (ref 30.0–36.0)
MCV: 97.5 fL (ref 80.0–100.0)
Monocytes Absolute: 0.8 10*3/uL (ref 0.1–1.0)
Monocytes Relative: 7 %
Neutro Abs: 10.1 10*3/uL — ABNORMAL HIGH (ref 1.7–7.7)
Neutrophils Relative %: 87 %
Platelets: 121 10*3/uL — ABNORMAL LOW (ref 150–400)
RBC: 3.16 MIL/uL — ABNORMAL LOW (ref 4.22–5.81)
RDW: 14.1 % (ref 11.5–15.5)
WBC: 11.6 10*3/uL — ABNORMAL HIGH (ref 4.0–10.5)
nRBC: 0 % (ref 0.0–0.2)

## 2020-09-11 LAB — URIC ACID: Uric Acid, Serum: 3.9 mg/dL (ref 3.7–8.6)

## 2020-09-11 LAB — LACTATE DEHYDROGENASE: LDH: 189 U/L (ref 98–192)

## 2020-09-11 MED ORDER — HYDROMORPHONE HCL 1 MG/ML IJ SOLN
1.0000 mg | Freq: Once | INTRAMUSCULAR | Status: AC
Start: 2020-09-11 — End: 2020-09-11
  Administered 2020-09-11: 1 mg via INTRAVENOUS
  Filled 2020-09-11: qty 1

## 2020-09-11 MED ORDER — HEPARIN SOD (PORK) LOCK FLUSH 100 UNIT/ML IV SOLN
500.0000 [IU] | Freq: Once | INTRAVENOUS | Status: AC
  Administered 2020-09-11: 500 [IU]
  Filled 2020-09-11: qty 5

## 2020-09-11 MED ORDER — SODIUM CHLORIDE 0.9% FLUSH
10.0000 mL | Freq: Once | INTRAVENOUS | Status: AC
  Administered 2020-09-11: 10 mL
  Filled 2020-09-11: qty 10

## 2020-09-11 MED ORDER — SODIUM CHLORIDE (PF) 0.9 % IJ SOLN
INTRAMUSCULAR | Status: AC
  Filled 2020-09-11: qty 50

## 2020-09-11 MED ORDER — OXYCODONE HCL 5 MG PO TABS: 5.0000 mg | ORAL_TABLET | ORAL | 0 refills | Status: DC | PRN

## 2020-09-11 MED ORDER — DIAZEPAM 5 MG/ML IJ SOLN
2.5000 mg | Freq: Once | INTRAMUSCULAR | Status: AC
  Administered 2020-09-11: 2.5 mg via INTRAVENOUS
  Filled 2020-09-11: qty 2

## 2020-09-11 MED ORDER — IOHEXOL 300 MG/ML  SOLN
75.0000 mL | Freq: Once | INTRAMUSCULAR | Status: AC | PRN
  Administered 2020-09-11: 75 mL via INTRAVENOUS

## 2020-09-11 NOTE — Progress Notes (Signed)
Patient transported to ED via w/c for evaluation of 10/10 pain in posterior neck that radiates to his left neck. Just has tenderness over right enlarged lymph node (which is smaller than last visit)  Pt has been experiencing neck pain since his port ewas placed and has continued to get worse. Takes oxycodone 5mg  and Ibuprofen 600 mg every 6 hours with minimal relief. Takes 2 oxycodone at night without relief. Pt to ED for eval, scans, pain control.  Pt's wife with him  Report given to Lovena Le, [place]

## 2020-09-11 NOTE — Progress Notes (Signed)
[place] [phone]   [phone]  PROGRESS NOTE  Patient Care Team: Shon Baton, MD as PCP - General (Internal Medicine)  Hematological/Oncological History #Follicular lymphoma Stage III 1) 01/30/2020: CT neck shows numerous enlarged lymph nodes, especially in the right submandibular space - which is the palpable abnormality. 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 Lorenso Courier  4) 02/22/2020: PET CT scan showed Deauville 4 and Deauville 5 adenopathy bilaterally in the neck. There is also a Deauville 4 left supraclavicular lymph node and a Deauville 4 right gastric lymph node 5) 04/24/2020: presents with rapid enlargement of his right submandibular lymph node.  6) 05/20/2020: partial excision of right submandibular lymph node with attempted fluid drainage. Pathology consistent with follicular lymphoma, no clear evidence of transformation.  7) 06/12/2020: Cycle 1 Day 1 of R-Benda. 8) 07/10/2020: Cycle 2 Day 1 of R-Benda. 9) 08/07/2020: Cycle 3 Day 1 of R-Benda. Noted to have enlargement of right cervical lymph node on exam.  10) 08/28/2020: Cycle 1 Day 1 of R-miniCHOP  Interval History:  Brian Castillo 80 y.o. 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 The patient's last visit was on 08/28/2020. In the interim since the last visit his right cervical lymph node has begun to shrink, however his neck pain has worsened.  He is here today for an urgent visit to evaluate his neck.  On exam today Brian Castillo is accompanied by his wife.  He notes he has not been well in the interim since our last visit.  He reports that he cannot lay flat as it causes him excruciating pain in his neck.  He has been sleeping upright in a chair.  He unfortunately has had very little sleep in the interim since her last visit.  He notes that he is not  currently having any other "shooting pains" but that he does have a serious baseline level of pain.  The oxycodone and his other medications help to take the edge off.  He notes that the tramadol has not been effective and that the muscle relaxers "are not cutting it".  He has been taking the oxycodone 5 mg as needed with modest relief.  He does not have any pain in the large lymph node or another bones in the body.  The pain starts approximately his mid neck and radiates up to the top of his head.  Due to concern for this finding we are going to send in the emergency department for imaging of the spine and head in order to assure that there is no lesion causing his pain.  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   Seizures ([place])    last seizure was in 1988    SURGICAL HISTORY: Past Surgical History:  Procedure Laterality Date   CARPOMETACARPEL SUSPENSION PLASTY Right 08/24/2014   Procedure: RIGHT CMC ARTHOPLASTY WITH DOUBLE TENDON TRANSFER AND REPAIR  RECONSTRUCTION ;  Surgeon: Roseanne Kaufman, MD;  Location: [facility];  Service: Orthopedics;  Laterality: Right;   CHOLECYSTECTOMY     COLONOSCOPY     IR IMAGING GUIDED PORT INSERTION  08/14/2020   IR RADIOLOGIST EVAL & MGMT  08/20/2020   KNEE ARTHROSCOPY  2003   bilateral   POLYPECTOMY      SOCIAL HISTORY: Social History   Socioeconomic History   Marital status: Married    Spouse name: Not on file   Number of children: Not on file   Years of education: Not on file   Highest education level: Not on file  Occupational History   Not on file  Tobacco Use   Smoking status: Former    Pack years: 0.00   Smokeless tobacco: Never   Tobacco  comments:    quit 1984  Substance and Sexual Activity   Alcohol use: Yes    Alcohol/week: 14.0 standard drinks    Types: 14 Glasses of wine per week   Drug use: No   Sexual activity: Not on file  Other Topics Concern   Not on file  Social History Narrative   Not on file   Social Determinants of Health   Financial Resource Strain: Not on file  Food Insecurity: Not on file  Transportation Needs: Not on file  Physical Activity: Not on file  Stress: Not on file  Social Connections: Not on file  Intimate Partner Violence: Not on file    FAMILY HISTORY: Family History  Problem Relation Age of Onset   Colon cancer Neg Hx    Colon polyps Neg Hx     ALLERGIES:  is allergic to brimonidine tartrate-timolol, lisinopril, and netarsudil dimesylate.  MEDICATIONS:  Current Outpatient Medications  Medication Sig Dispense Refill   acyclovir (ZOVIRAX) 400 MG tablet Take 1 tablet (400 mg total) by mouth 2 (two) times daily. 60 tablet 5   amLODipine (NORVASC) 5 MG tablet Take 5 mg by mouth Daily.      aspirin 81 MG tablet Take 81 mg by mouth daily.     Calcium Carb-Cholecalciferol (CALCIUM 600 + D PO) Take 1 tablet by mouth daily.     cholecalciferol (VITAMIN D) 1000 units tablet Take 1,000 Units by mouth daily.     COVID-19 mRNA Vac-TriS, Pfizer, (PFIZER-BIONT COVID-19 VAC-TRIS) SUSP injection Inject into the muscle. 0.3 mL 0   cyclobenzaprine (FLEXERIL) 5 MG tablet Take 1 tablet (5 mg total) by mouth 3 (three) times daily as needed for muscle spasms. 30 tablet 0   dexamethasone (DECADRON) 4 MG tablet Take 2 tablets (8 mg total) by mouth as directed. Take 2 pills (8mg ) twice daily on Days 3 and 4 of treatment 8 tablet 5   diazepam (VALIUM) 5 MG tablet Take 1 tablet (5 mg total) by mouth every 8 (eight) hours as needed for anxiety. 15 tablet 0   Latanoprostene Bunod (VYZULTA) 0.024 % SOLN Place 1 drop into both eyes at bedtime.     lidocaine-prilocaine (EMLA) cream Apply 1 application  topically as needed. 30 g 0   metoprolol succinate (TOPROL-XL) 25 MG 24 hr tablet Take 25 mg by mouth Daily.      ondansetron (ZOFRAN) 8 MG tablet Take 1 tablet (8 mg total) by mouth every 8 (eight) hours as needed for nausea or vomiting. 30 tablet 0   oxyCODONE (OXY IR/ROXICODONE) 5 MG immediate release tablet Take 1-2 tablets (5-10 mg total) by mouth every 4 (four) hours as needed for severe pain. 20 tablet 0   phenytoin (DILANTIN) 100 MG ER capsule Take 200  mg by mouth 2 (two) times daily.     pravastatin (PRAVACHOL) 80 MG tablet Take 80 mg by mouth Daily.      predniSONE (DELTASONE) 20 MG tablet Take 3 tablets (60 mg total) by mouth as directed. Take 3 tablets (60mg ) by mouth daily starting on Chemotherapy Day 1 and continuing for 4 days afterwards. 15 tablet 6   prochlorperazine (COMPAZINE) 10 MG tablet Take 1 tablet (10 mg total) by mouth every 6 (six) hours as needed for nausea or vomiting. 30 tablet 0   timolol (BETIMOL) 0.5 % ophthalmic solution Place 1 drop into both eyes 2 (two) times daily.     traMADol (ULTRAM) 50 MG tablet Take 1 tablet (50 mg total) by mouth every 6 (six) hours as needed. 25 tablet 0   No current facility-administered medications for this visit.   Facility-Administered Medications Ordered in Other Visits  Medication Dose Route Frequency Provider Last Rate Last Admin   sodium chloride (PF) 0.9 % injection             REVIEW OF SYSTEMS:   Constitutional: ( - ) fevers, ( - )  chills , ( - ) night sweats Eyes: ( - ) blurriness of vision, ( - ) double vision, ( - ) watery eyes Ears, nose, mouth, throat, and face: ( - ) mucositis, ( - ) sore throat Respiratory: ( - ) cough, ( - ) dyspnea, ( - ) wheezes Cardiovascular: ( - ) palpitation, ( - ) chest discomfort, ( - ) lower extremity swelling Gastrointestinal:  ( - ) nausea, ( - ) heartburn, ( - ) change in bowel habits Skin: ( - ) abnormal skin rashes Lymphatics: ( - ) new lymphadenopathy, ( - ) easy  bruising Neurological: ( - ) numbness, ( - ) tingling, ( - ) new weaknesses Behavioral/Psych: ( - ) mood change, ( - ) new changes  All other systems were reviewed with the patient and are negative.  PHYSICAL EXAMINATION: ECOG PERFORMANCE STATUS: 1 - Symptomatic but completely ambulatory  Vitals:   09/11/20 0900  BP: 118/60  Pulse: 80  Resp: 19  Temp: 97.6 F (36.4 C)  SpO2: 98%   Filed Weights   09/11/20 0900  Weight: 173 lb (78.5 kg)    GENERAL: well appearing elderly Caucasian male in NAD  SKIN: skin color, texture, turgor are normal, no rashes or significant lesions EYES: conjunctiva are pink and non-injected, sclera clear NECK: supple, non-tender LYMPH:  Single palpable right sided submandibular lymph node. Otherwise no other marked palpable lymphadenopathy in the cervical, axillary or supraclavicular lymph nodes.  LUNGS: clear to auscultation and percussion with normal breathing effort HEART: regular rate & rhythm and no murmurs and no lower extremity edema Musculoskeletal: no cyanosis of digits and no clubbing  PSYCH: alert & oriented x 3, fluent speech NEURO: no focal motor/sensory deficits  LABORATORY DATA:  I have reviewed the data as listed CBC Latest Ref Rng & Units 09/11/2020 09/11/2020 09/04/2020  WBC 4.0 - 10.5 K/uL 11.6(H) 11.5(H) 2.8(L)  Hemoglobin 13.0 - 17.0 g/dL 10.8(L) 10.3(L) 10.4(L)  Hematocrit 39.0 - 52.0 % 30.8(L) 29.1(L) 29.3(L)  Platelets 150 - 400 K/uL 121(L) 114(L) 95(L)    CMP Latest Ref Rng & Units 09/11/2020 09/11/2020 09/04/2020  Glucose 70 - 99 mg/dL 120(H) 117(H) 103(H)  BUN 8 - 23 mg/dL 12 12 11   Creatinine 0.61 - 1.24 mg/dL 0.64 0.81 0.69  Sodium 135 - 145 mmol/L 135 135 141  Potassium 3.5 - 5.1  mmol/L 4.7 4.2 3.9  Chloride 98 - 111 mmol/L 100 101 106  CO2 22 - 32 mmol/L 27 24 25   Calcium 8.9 - 10.3 mg/dL 9.1 9.3 9.0  Total Protein 6.5 - 8.1 g/dL 7.5 7.1 6.5  Total Bilirubin 0.3 - 1.2 mg/dL 0.6 0.5 0.4  Alkaline Phos 38 - 126 U/L 106  126 90  AST 15 - 41 U/L 16 14(L) 12(L)  ALT 0 - 44 U/L 19 15 7    RADIOGRAPHIC STUDIES: I have personally reviewed the radiological images as listed and agreed with the findings in the report: stable lymphadenopathy with exception of the rapidly enlarging submandibular lymph node.   CT Head Wo Contrast  Result Date: 09/11/2020 CLINICAL DATA:  Headache.  History of lymphoma. EXAM: CT HEAD WITHOUT CONTRAST TECHNIQUE: Contiguous axial images were obtained from the base of the skull through the vertex without intravenous contrast. COMPARISON:  None. FINDINGS: Brain: No evidence of acute infarction, hemorrhage, hydrocephalus, extra-axial collection or mass lesion/mass effect. Vascular: Negative for hyperdense vessel Skull: Negative Sinuses/Orbits: Paranasal sinuses clear.  Negative orbit Other: None IMPRESSION: Negative CT head Electronically Signed   By: Franchot Gallo M.D.   On: 09/11/2020 15:39   CT Soft Tissue Neck W Contrast  Result Date: 09/11/2020 CLINICAL DATA:  Posterior neck pain on the left. Enlarged right lymph node. History of lymphoma currently on treatment. EXAM: CT NECK WITH CONTRAST TECHNIQUE: Multidetector CT imaging of the neck was performed using the standard protocol following the bolus administration of intravenous contrast. CONTRAST:  20mL OMNIPAQUE IOHEXOL 300 MG/ML  SOLN COMPARISON:  CT neck 04/29/2020.  PET CT 08/22/2020 FINDINGS: Pharynx and larynx: Normal. No mass or swelling. Bilateral tonsilliths Salivary glands: Previously noted large necrotic lymph node anterior to the right submandibular gland is significantly smaller now measuring 14 mm in diameter. Adjacent submandibular gland is normal. Left submandibular gland normal. Normal parotid bilaterally. Thyroid: Negative Lymph nodes: Interval development of large right lateral lymph node mass lateral to the submandibular gland. This shows peripheral enhancement with irregular areas of necrosis. This measures approximately 3.3 x 5  cm and was not present on the prior neck CT however was markedly hypermetabolic on recent PET. Multiple small posterior and inferior lymph nodes in the neck elsewhere have improved significantly since the prior CT. Vascular: Port-A-Cath tip right jugular vein extending into the SVC. Normal vascular enhancement. Atherosclerotic disease in the carotid bifurcation bilaterally. 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 Visualized orbits: Not imaged Mastoids and visualized paranasal sinuses: Negative Skeleton: Cervical spondylosis.  No acute skeletal abnormality. Upper chest: Lung apices clear bilaterally. Other: None IMPRESSION: Large lymph node mass in the right lateral neck measuring 3.3 x 5 cm. This was markedly hypermetabolic on recent PET and is consistent with tumor. This has developed since the prior neck CT of 04/29/2020. In this patient with a history of lymphoma, this could be a new area of lymphoma versus metastatic carcinoma. Interval improvement in lymph nodes elsewhere in the neck including anterior to the right submandibular gland and in the posteroinferior neck bilaterally. Electronically Signed   By: Franchot Gallo M.D.   On: 09/11/2020 15:38   NM PET Image Restag (PS) Skull Base To Thigh  Result Date: 08/23/2020 CLINICAL DATA:  Subsequent treatment strategy for follicular lymphoma. EXAM: NUCLEAR MEDICINE PET SKULL BASE TO THIGH TECHNIQUE: 8.7 mCi F-18 FDG was injected  intravenously. Full-ring PET imaging was performed from the skull base to thigh after the radiotracer. CT data was obtained and used for attenuation correction and anatomic localization. Fasting blood glucose: 115 mg/dl COMPARISON:  December of 2021. FINDINGS: Mediastinal blood pool activity: SUV max 3.1 Liver activity: SUV max 4.48 NECK: RIGHT level IB lymph node has enlarged since the  previous study. This now measures approximately 3.8 x 5.0 cm with a maximum SUV of 23.1 as compared to 14.2 on the prior study (image 37/4). Lymph nodes in the LEFT neck have resolved. Previous lymph node at the LEFT level II level that showed a maximum SUV of 10.3 posterior to the sternocleidomastoid on the prior study is no longer visible. No additional signs of nodal disease in the neck. Incidental CT findings: none CHEST: No hypermetabolic mediastinal or hilar nodes. Scattered nodular foci show improvement since previous imaging. There is a stable nodule in the LEFT mid chest (image 45/8) 7 mm without significant FDG uptake maximum SUV less than 1. Other scattered small nodules in the chest many of which have resolved. In an area that previously showed branching tree-in-bud changes there is a focal nodule that measures 8 x 5 mm with a maximum SUV of 1.5. Airways are patent. Incidental CT findings: Calcified atheromatous plaque of the thoracic aorta. Normal heart size without pericardial effusion. Normal caliber of the central pulmonary vessels. RIGHT-sided Port-A-Cath terminates at the caval to atrial junction. Limited assessment of cardiovascular structures given lack of intravenous contrast. Esophagus mildly patulous. ABDOMEN/PELVIS: Resolution of small lymph node in the gastrohepatic recess otherwise negative appearance of the abdomen and pelvis, no signs of FDG avid disease. Incidental CT findings: Post cholecystectomy. Unremarkable appearance of liver, spleen, pancreas, adrenal glands and kidneys. Urinary bladder is collapsed. No hydronephrosis. Stable haziness of the jejunal mesentery, remains nonspecific with calcifications in the ileal mesentery. Calcified atheromatous plaque of the abdominal aorta. No aneurysmal dilation. Prostate with enlargement as before. Small fat containing umbilical hernia and LEFT inguinal hernia. SKELETON: No focal hypermetabolic activity to suggest skeletal metastasis.  Incidental CT findings: Spinal degenerative changes. IMPRESSION: Deauville 5 changes with marked enlargement of submandibular lymph node on the RIGHT with markedly increased metabolic activity over previous imaging. Resolution of LEFT neck lymph nodes and lymph node in the upper abdomen. These areas are no longer visible. No additional new or progressive findings. Scattered pulmonary nodules favored to represent post infectious or inflammatory changes. Attention on follow-up. Stable haziness of jejunal mesentery. Attention on follow-up. Not associated with adenopathy or significant hypermetabolism. Electronically Signed   By: Zetta Bills M.D.   On: 08/23/2020 15:17   ECHOCARDIOGRAM COMPLETE  Result Date: 08/13/2020    ECHOCARDIOGRAM REPORT   Patient Name:   Brian Castillo Date of Exam: 08/13/2020 Medical Rec #:  [id number]       Height:       68.0 in Accession #:    [id number]      Weight:       175.4 lb Date of Birth:  12/06/1940        BSA:          1.933 m Patient Age:    24 years        BP:           141/57 mmHg Patient Gender: M               HR:           64 bpm. Exam Location:  Outpatient  Procedure: 2D Echo, Cardiac Doppler, Color Doppler and Strain Analysis Indications:    Chemo Z09  History:        Patient has no prior history of Echocardiogram examinations.                 Risk Factors:Hypertension and Dyslipidemia.  Sonographer:    Bernadene Person RDCS Referring Phys: [id number] [place]  1. Left ventricular ejection fraction, by estimation, is 60 to 65%. The left ventricle has normal function. The left ventricle has no regional wall motion abnormalities. Left ventricular diastolic parameters are indeterminate.  2. Right ventricular systolic function is normal. The right ventricular size is normal.  3. The mitral valve is normal in structure. No evidence of mitral valve regurgitation.  4. The aortic valve is grossly normal. There is mild calcification of the aortic valve. Aortic valve  regurgitation is not visualized. No aortic stenosis is present. FINDINGS  Left Ventricle: Left ventricular ejection fraction, by estimation, is 60 to 65%. The left ventricle has normal function. The left ventricle has no regional wall motion abnormalities. The left ventricular internal cavity size was normal in size. There is  no left ventricular hypertrophy. Left ventricular diastolic parameters are indeterminate. Right Ventricle: The right ventricular size is normal. Right vetricular wall thickness was not well visualized. Right ventricular systolic function is normal. Left Atrium: Left atrial size was normal in size. Right Atrium: Right atrial size was normal in size. Pericardium: There is no evidence of pericardial effusion. Mitral Valve: The mitral valve is normal in structure. No evidence of mitral valve regurgitation. Tricuspid Valve: The tricuspid valve is grossly normal. Tricuspid valve regurgitation is trivial. Aortic Valve: The aortic valve is grossly normal. There is mild calcification of the aortic valve. Aortic valve regurgitation is not visualized. No aortic stenosis is present. Pulmonic Valve: The pulmonic valve was normal in structure. Pulmonic valve regurgitation is trivial. Aorta: The aortic root and ascending aorta are structurally normal, with no evidence of dilitation. IAS/Shunts: The atrial septum is grossly normal.  LEFT VENTRICLE PLAX 2D LVIDd:         5.00 cm  Diastology LVIDs:         3.00 cm  LV e' medial:    5.15 cm/s LV PW:         0.90 cm  LV E/e' medial:  14.1 LV IVS:        0.90 cm  LV e' lateral:   5.63 cm/s LVOT diam:     2.10 cm  LV E/e' lateral: 12.9 LV SV:         79 LV SV Index:   41       2D Longitudinal Strain LVOT Area:     3.46 cm 2D Strain GLS Avg:     -24.6 %  RIGHT VENTRICLE TAPSE (M-mode): 2.3 cm LEFT ATRIUM             Index       RIGHT ATRIUM           Index LA diam:        4.60 cm 2.38 cm/m  RA Area:     13.20 cm LA Vol (A2C):   47.2 ml 24.42 ml/m RA Volume:    27.00 ml  13.97 ml/m LA Vol (A4C):   54.0 ml 27.94 ml/m LA Biplane Vol: 52.1 ml 26.95 ml/m  AORTIC VALVE LVOT Vmax:   113.00 cm/s LVOT Vmean:  70.600 cm/s LVOT VTI:    0.227  m  AORTA Ao Root diam: 3.70 cm Ao Asc diam:  3.30 cm MITRAL VALVE MV Area (PHT): 2.80 cm    SHUNTS MV Decel Time: 271 msec    Systemic VTI:  0.23 m MV E velocity: 72.40 cm/s  Systemic Diam: 2.10 cm MV A velocity: 74.10 cm/s MV E/A ratio:  0.98 Mertie Moores MD Electronically signed by Mertie Moores MD Signature Date/Time: 08/13/2020/6:06:16 PM    Final    IR IMAGING GUIDED PORT INSERTION  Result Date: 08/14/2020 INDICATION: 80 year old male with history of recurrent follicular lymphoma requiring central venous access for chemotherapy. EXAM: IMPLANTED PORT A CATH PLACEMENT WITH ULTRASOUND AND FLUOROSCOPIC GUIDANCE COMPARISON:  None. MEDICATIONS: None. ANESTHESIA/SEDATION: Moderate (conscious) sedation was employed during this procedure. A total of Versed 2 mg and Fentanyl 75 mcg was administered intravenously. Moderate Sedation Time: 15 minutes. The patient's level of consciousness and vital signs were monitored continuously by radiology nursing throughout the procedure under my direct supervision. CONTRAST:  None FLUOROSCOPY TIME:  0 minutes, 12 seconds (2 mGy) COMPLICATIONS: None immediate. PROCEDURE: The procedure, risks, benefits, and alternatives were explained to the patient. Questions regarding the procedure were encouraged and answered. The patient understands and consents to the procedure. The right neck and chest were prepped with chlorhexidine in a sterile fashion, and a sterile drape was applied covering the operative field. Maximum barrier sterile technique with sterile gowns and gloves were used for the procedure. A timeout was performed prior to the initiation of the procedure. Ultrasound was used to examine the jugular vein which was compressible and free of internal echoes. A skin marker was used to demarcate the planned  venotomy and port pocket incision sites. Local anesthesia was provided to these sites and the subcutaneous tunnel track with 1% lidocaine with 1:100,000 epinephrine. A small incision was created at the jugular access site and blunt dissection was performed of the subcutaneous tissues. Under ultrasound guidance, the jugular vein was accessed with a 21 ga micropuncture needle and an 0.018" wire was inserted to the superior vena cava. Real-time ultrasound guidance was utilized for vascular access including the acquisition of a permanent ultrasound image documenting patency of the accessed vessel. A 5 Fr micopuncture set was then used, through which a 0.035" Rosen wire was passed under fluoroscopic guidance into the inferior vena cava. An 8 Fr dilator was then placed over the wire. A subcutaneous port pocket was then created along the upper chest wall utilizing a combination of sharp and blunt dissection. The pocket was irrigated with sterile saline, packed with gauze, and observed for hemorrhage. A single lumen "ISP" sized power injectable port was chosen for placement. The 8 Fr catheter was tunneled from the port pocket site to the venotomy incision. The port was placed in the pocket. The external catheter was trimmed to appropriate length. The dilator was exchanged for an 8 Fr peel-away sheath under fluoroscopic guidance. The catheter was then placed through the sheath and the sheath was removed. Final catheter positioning was confirmed and documented with a fluoroscopic spot radiograph. The port was accessed with a Huber needle, aspirated, and flushed with heparinized saline. The deep dermal layer of the port pocket incision was closed with interrupted 3-0 Vicryl suture. Dermabond was then placed over the port pocket and neck incisions. The patient tolerated the procedure well without immediate post procedural complication. FINDINGS: After catheter placement, the tip lies within the superior cavoatrial junction. The  catheter aspirates and flushes normally and is ready for immediate use. IMPRESSION: Successful  placement of a power injectable Port-A-Cath via the right internal jugular vein. The catheter is ready for immediate use. Ruthann Cancer, MD [facility] Radiology Electronically Signed   By: Ruthann Cancer MD   On: 08/14/2020 15:11   IR Radiologist Eval & Mgmt  Result Date: 08/20/2020 Please refer to notes tab for details about interventional procedure. (Op Note)   ASSESSMENT & PLAN Brian Castillo 80 y.o. 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 The patient's last visit was on 02/12/2020 at which time he establish care.  After review the labs, the records, discussion with the patient the findings are most consistent with a stage III follicular lymphoma based on the PET CT scan.  There are lymph nodes on both sides of diaphragm and therefore stage III would be the most appropriate designation.    Previously we discussed the nature of follicular lymphoma and the treatment options moving forward.  The options at this time would be observation, immunotherapy, or combination of chemotherapy and immunotherapy.  Given the lack of symptoms, cytopenias, and bulky disease at the time of presentation we decided to proceed with observation alone. Unfortunately due to rapid progression we need to consider treatment moving forward.  Additionally we discussed rituximab monotherapy with weekly dosing x4 doses followed by every 2 months dosing x4 as a treatment option as well as bendamustine and rituximab on days 1 and 2 of a 28-day cycle x6.  We discussed the side effects, risks and benefits, and possible outcomes of treatment.  The patient voiced his understanding of these options and wished to proceed with R-Benda.   Over the period of one month the patient was found to have a rapidly enlarging right submandibular  lymph node.  Due to concern for this enlarging lymph node we ordered a CT scan of the chest abdomen pelvis to assess for progression elsewhere, but that was the only lymph node that was progressing. Biopsy with ENT showed no progression into a DLBCL or other aggressive variant.  On PET CT scan from 08/23/2020 the patient showed complete response of disease with every site with the exception of his right cervical lymph node which is increasing in size and brightness.  As such we have to discontinue first-line therapy with rituximab and Bendamustine and transition over to second line R mini CHOP.  R mini CHOP chemotherapy Cycle 1 Day1 started on 08/28/2020.  #Follicular lymphoma, Stage III --new baseline PET CT scan unfortunately showed progression of the right cervical lymph node.  We are currently planning for 2nd line therapy with R-miniCHOP(which would also be effective if patient is having transformation of the tumor).  -- patient completed PET, Port, and TTE prior to start of R-miniCHOP. --prior PET CT findings are consistent with a Stage III follicular lymphoma. Due to rapidly enlarging submandibular lymph node we proceeded with treatment.  --imaging shows stable disease save for a single enlarging submandibular lymph node.  Prior biopsy has not shown transformation into a more aggressive lymphoma.  --after discussion with the patient, he was agreeable to starting Bendamustine/Rituximab as first line treatment.  --started Cycle 1 Day 1 of R-Benda chemotherapy on 06/12/2020 --completed Cycle 3 Day of R-Benda. Unfortunately he is having progression for which we are changing therapy to R-miniCHOP.  --08/28/2020 is Day 1 of Cycle 1 of R-miniCHOP --RTC next week to start Cycle 2 Day 1 of R-miniCHOP   #Pain Control -- Patient is having  worsening pain in his right cervical lymph node which is interfering with sleep. --he is also having severe pain in his midline neck up to his scalp --sent to ED today  for urgent imaging to assess for a clear cause of his findings --increase oxycodone to 5-10mg  PO q 4H PRN with ibuprofen 600mg  Q6H PRN. --continue flexeril 5mg  q8H PRN.  --Continue to monitor  #Medication Interaction --Allopurinol increases the levels of Dilantin. --We will work with the patient's primary care provider Dr. Shon Baton to monitor his Dilantin levels and adjust accordingly. For now we are following this weekly.  #Fever, resolved --Single episode of fever up to 101 F occurred on 06/25/2020. --Patient was not neutropenic at that time and was evaluated emergency department with infectious work-up including chest x-ray, blood cultures, and urine culture.  Data at that time showed no evidence of infectious disease. --No further fevers noted since that time.  We will continue to monitor.  #Supportive Care --chemotherapy education completed --zofran 8mg  q8H PRN and compazine 10mg  PO q6H for nausea --acyclovir 400mg  PO BID for VCZ prophylaxis -- allopurinol 300mg  PO daily for TLS prophylaxis -- port in place -- no pain medication required at this time.    No orders of the defined types were placed in this encounter.  All questions were answered. The patient knows to call the clinic with any problems, questions or concerns.  A total of more than 30 minutes were spent on this encounter and over half of that time was spent on counseling and coordination of care as outlined above.   Ledell Peoples, MD Department of Hematology/Oncology [facility] Phone: [phone] Pager: [phone] Email: [email or web address]  09/11/2020 7:23 PM

## 2020-09-11 NOTE — ED Triage Notes (Signed)
Pt presents to the ED from the [facility] for neck pain. Per oncology nurse, pt has had a two week hx of neck pain, which began after his port was placed. IR evaluated his port around 10 days ago with no real findings at that time. The neck pain is posterior and left sided. The pt was evaluated at [facility] 3 days ago with no imaging done at that time and was d/c'd with pain medications. Pt has a hx of lymphoma and last received his Nulasta injection two weeks ago. Pt is followed by Dr. Lorenso Courier.

## 2020-09-11 NOTE — Discharge Instructions (Addendum)
We have increased her pain medicines to help with the neck pain.  Follow-up with Dr. Lorenso Courier if pain continues.  You can take 1-2 of your 5 mg oxycodone tablets every 4 hours as needed.  If this does not improve symptoms you can discuss with Dr. Lorenso Courier he can see you sooner than your planned visit.

## 2020-09-11 NOTE — ED Provider Notes (Signed)
[facility] DEPT Provider Note   CSN: [id number] Arrival date & time: 09/11/20  0940     History Chief Complaint  Patient presents with   Neck Pain    Brian Castillo is a 80 y.o. male.  HPI     80yo male with history of follicular lymphoma, hypertension, hyperlipidemia, port-a-cath placement 6/22 who presents with concern for worsening neck pain.  Was seen in the ED 6/26 and went to oncology office today and sent to ED for further evaluation.  Saturday Sunday pain worsened.  6/22 had portacath placed Pain located right side of neck, worsens with turning head Pain to top of head, right posterior neck and down to right side of neck Taking oxycodone and ibuprofen with minimal relief No fever Switches from left to right side. Denies chest pain or dyspnea. 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 ([place]) 05/29/2020   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/24/2014    Past Surgical History:  Procedure Laterality Date   CARPOMETACARPEL SUSPENSION PLASTY Right 08/24/2014   Procedure: RIGHT CMC ARTHOPLASTY WITH DOUBLE TENDON TRANSFER AND REPAIR  RECONSTRUCTION ;  Surgeon: Roseanne Kaufman, MD;  Location: [facility];  Service: Orthopedics;  Laterality: Right;   CHOLECYSTECTOMY     COLONOSCOPY     IR IMAGING GUIDED PORT INSERTION  08/14/2020   IR RADIOLOGIST EVAL & MGMT  08/20/2020   KNEE ARTHROSCOPY  2003   bilateral   POLYPECTOMY         Family  History  Problem Relation Age of Onset   Colon cancer Neg Hx    Colon polyps Neg Hx     Social History   Tobacco Use   Smoking status: Former    Pack years: 0.00   Smokeless tobacco: Never   Tobacco comments:    quit 1984  Substance Use Topics   Alcohol use: Yes    Alcohol/week: 14.0 standard drinks    Types: 14 Glasses of wine per week   Drug use: No    Home Medications Prior to Admission medications   Medication Sig Start Date End Date Taking? Authorizing Provider  acyclovir (ZOVIRAX) 400 MG tablet Take 1 tablet (400 mg total) by mouth 2 (two) times daily. 05/29/20   Orson Slick, MD  amLODipine (NORVASC) 5 MG tablet Take 5 mg by mouth Daily.  11/28/11   [provider]  aspirin 81 MG tablet Take 81 mg by mouth daily.    [provider]  Calcium Carb-Cholecalciferol (CALCIUM 600 + D PO) Take 1 tablet by mouth daily.    [provider]  cholecalciferol (VITAMIN D) 1000 units tablet Take 1,000 Units by mouth daily.    [provider]  COVID-19 mRNA Vac-TriS, Pfizer, (PFIZER-BIONT COVID-19 VAC-TRIS) SUSP injection Inject into the muscle. 08/16/20   Baxter Flattery,  Caren Griffins, MD  cyclobenzaprine (FLEXERIL) 5 MG tablet Take 1 tablet (5 mg total) by mouth 3 (three) times daily as needed for muscle spasms. 09/09/20   Orson Slick, MD  dexamethasone (DECADRON) 4 MG tablet Take 2 tablets (8 mg total) by mouth as directed. Take 2 pills (8mg ) twice daily on Days 3 and 4 of treatment 05/29/20   Orson Slick, MD  diazepam (VALIUM) 5 MG tablet Take 1 tablet (5 mg total) by mouth every 8 (eight) hours as needed for anxiety. 09/08/20   Isla Pence, MD  Latanoprostene Bunod (VYZULTA) 0.024 % SOLN Place 1 drop into both eyes at bedtime.    [provider]  lidocaine-prilocaine (EMLA) cream Apply 1 application topically as needed. 08/28/20   Orson Slick, MD  metoprolol succinate (TOPROL-XL) 25 MG 24 hr tablet Take 25 mg by mouth Daily.  11/28/11    [provider]  ondansetron (ZOFRAN) 8 MG tablet Take 1 tablet (8 mg total) by mouth every 8 (eight) hours as needed for nausea or vomiting. 05/29/20   Orson Slick, MD  oxyCODONE (OXY IR/ROXICODONE) 5 MG immediate release tablet Take 1-2 tablets (5-10 mg total) by mouth every 4 (four) hours as needed for severe pain. 09/11/20   Davonna Belling, MD  phenytoin (DILANTIN) 100 MG ER capsule Take 200 mg by mouth 2 (two) times daily.    [provider]  pravastatin (PRAVACHOL) 80 MG tablet Take 80 mg by mouth Daily.  11/28/11   [provider]  predniSONE (DELTASONE) 20 MG tablet Take 3 tablets (60 mg total) by mouth as directed. Take 3 tablets (60mg ) by mouth daily starting on Chemotherapy Day 1 and continuing for 4 days afterwards. 08/28/20   Orson Slick, MD  prochlorperazine (COMPAZINE) 10 MG tablet Take 1 tablet (10 mg total) by mouth every 6 (six) hours as needed for nausea or vomiting. 05/29/20   Orson Slick, MD  timolol (BETIMOL) 0.5 % ophthalmic solution Place 1 drop into both eyes 2 (two) times daily.    [provider]  traMADol (ULTRAM) 50 MG tablet Take 1 tablet (50 mg total) by mouth every 6 (six) hours as needed. 08/21/20   Orson Slick, MD    Allergies    Brimonidine tartrate-timolol, Lisinopril, and Netarsudil dimesylate  Review of Systems   Review of Systems  Constitutional:  Negative for fever.  Respiratory:  Negative for cough and shortness of breath.   Cardiovascular:  Negative for chest pain.  Gastrointestinal:  Negative for nausea and vomiting.  Musculoskeletal:  Positive for neck pain.  Neurological:  Positive for headaches. Negative for weakness, light-headedness and numbness.   Physical Exam Updated Vital Signs BP 129/60 (BP Location: Right Arm)   Pulse 85   Temp 98.3 F (36.8 C) (Oral)   Resp 18   SpO2 96%   Physical Exam Vitals and nursing note reviewed.  Constitutional:      General: He is not in acute  distress.    Appearance: He is well-developed. He is not diaphoretic.  HENT:     Head: Normocephalic and atraumatic.  Eyes:     Conjunctiva/sclera: Conjunctivae normal.  Cardiovascular:     Rate and Rhythm: Normal rate and regular rhythm.     Heart sounds: Normal heart sounds. No murmur heard.   No friction rub. No gallop.  Pulmonary:     Effort: Pulmonary effort is normal. No respiratory distress.  Breath sounds: Normal breath sounds. No wheezing or rales.     Comments: Incision c/d/I chest wall, n osurrounding erythema Abdominal:     General: There is no distension.     Palpations: Abdomen is soft.     Tenderness: There is no abdominal tenderness. There is no guarding.  Musculoskeletal:        General: Tenderness (bilateral posterior neck) present.     Cervical back: Normal range of motion. Tenderness present.  Lymphadenopathy:     Cervical: Cervical adenopathy (large right anterior cervical lymph node) present.  Skin:    General: Skin is warm and dry.  Neurological:     Mental Status: He is alert and oriented to person, place, and time.    ED Results / Procedures / Treatments   Labs (all labs ordered are listed, but only abnormal results are displayed) Labs Reviewed  CBC WITH DIFFERENTIAL/PLATELET - Abnormal; Notable for the following components:      Result Value   WBC 11.6 (*)    RBC 3.16 (*)    Hemoglobin 10.8 (*)    HCT 30.8 (*)    MCH 34.2 (*)    Platelets 121 (*)    Neutro Abs 10.1 (*)    Lymphs Abs 0.2 (*)    Abs Immature Granulocytes 0.13 (*)    All other components within normal limits  COMPREHENSIVE METABOLIC PANEL - Abnormal; Notable for the following components:   Glucose, Bld 120 (*)    All other components within normal limits    EKG None  Radiology CT Head Wo Contrast  Result Date: 09/11/2020 CLINICAL DATA:  Headache.  History of lymphoma. EXAM: CT HEAD WITHOUT CONTRAST TECHNIQUE: Contiguous axial images were obtained from the base of the  skull through the vertex without intravenous contrast. COMPARISON:  None. FINDINGS: Brain: No evidence of acute infarction, hemorrhage, hydrocephalus, extra-axial collection or mass lesion/mass effect. Vascular: Negative for hyperdense vessel Skull: Negative Sinuses/Orbits: Paranasal sinuses clear.  Negative orbit Other: None IMPRESSION: Negative CT head Electronically Signed   By: Franchot Gallo M.D.   On: 09/11/2020 15:39   CT Soft Tissue Neck W Contrast  Result Date: 09/11/2020 CLINICAL DATA:  Posterior neck pain on the left. Enlarged right lymph node. History of lymphoma currently on treatment. EXAM: CT NECK WITH CONTRAST TECHNIQUE: Multidetector CT imaging of the neck was performed using the standard protocol following the bolus administration of intravenous contrast. CONTRAST:  21mL OMNIPAQUE IOHEXOL 300 MG/ML  SOLN COMPARISON:  CT neck 04/29/2020.  PET CT 08/22/2020 FINDINGS: Pharynx and larynx: Normal. No mass or swelling. Bilateral tonsilliths Salivary glands: Previously noted large necrotic lymph node anterior to the right submandibular gland is significantly smaller now measuring 14 mm in diameter. Adjacent submandibular gland is normal. Left submandibular gland normal. Normal parotid bilaterally. Thyroid: Negative Lymph nodes: Interval development of large right lateral lymph node mass lateral to the submandibular gland. This shows peripheral enhancement with irregular areas of necrosis. This measures approximately 3.3 x 5 cm and was not present on the prior neck CT however was markedly hypermetabolic on recent PET. Multiple small posterior and inferior lymph nodes in the neck elsewhere have improved significantly since the prior CT. Vascular: Port-A-Cath tip right jugular vein extending into the SVC. Normal vascular enhancement. Atherosclerotic disease in the carotid bifurcation bilaterally. 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 Visualized orbits: Not  imaged Mastoids and visualized paranasal sinuses: Negative Skeleton: Cervical spondylosis.  No acute skeletal abnormality. Upper chest: Lung apices clear bilaterally. Other: None IMPRESSION: Large lymph node mass in the right lateral neck measuring 3.3 x 5 cm. This was markedly hypermetabolic on recent PET and is consistent with tumor. This has developed since the prior neck CT of 04/29/2020. In this patient with a history of lymphoma, this could be a new area of lymphoma versus metastatic carcinoma. Interval improvement in lymph nodes elsewhere in the neck including anterior to the right submandibular gland and in the posteroinferior neck bilaterally. Electronically Signed   By: Franchot Gallo M.D.   On: 09/11/2020 15:38    Procedures Procedures   Medications Ordered in ED Medications  sodium chloride (PF) 0.9 % injection (  Not Given 09/11/20 1449)  HYDROmorphone (DILAUDID) injection 1 mg (has no administration in time range)  diazepam (VALIUM) injection 2.5 mg (2.5 mg Intravenous Given 09/11/20 1247)  iohexol (OMNIPAQUE) 300 MG/ML solution 75 mL (75 mLs Intravenous Contrast Given 09/11/20 1440)    ED Course  I have reviewed the triage vital signs and the nursing notes.  Pertinent labs & imaging results that were available during my care of the patient were reviewed by me and considered in my medical decision making (see chart for details).    MDM Rules/Calculators/A&P                           80yo male with history of follicular lymphoma, hypertension, hyperlipidemia, port-a-cath placement 6/22, ED evaluation 6/26, who presents with concern for worsening neck pain.  It is worse with movements, no chest pain or dyspnea and doubt ACS or intrathoracic etiology of pain.  No intraabdominal pain.  No trauma, doubt acute fracture, had PET without metabolic metastases to bone 6/9.   Given pain  developed after porta-cath placement and worsened over the weekend, consider thrombosis associated with catheter. Discussed with radiology and feel soft tissue neck CT best to evaluate for this. Ordered valium for pain. GIven headache, CT head ordered for evaluation.  No fever, doubt bacterial meningitis. Headache slow onset and doubt SAH.  Signed out with CT head and CT soft tissue neck pending.     Final Clinical Impression(s) / ED Diagnoses Final diagnoses:  Neck pain    Rx / DC Orders ED Discharge Orders          Ordered    oxyCODONE (OXY IR/ROXICODONE) 5 MG immediate release tablet  Every 4 hours PRN        09/11/20 1754             Gareth Morgan, MD 09/11/20 1816

## 2020-09-11 NOTE — Patient Instructions (Signed)

## 2020-09-11 NOTE — ED Notes (Signed)
Report received from Drucie Ip, Therapist, sports.

## 2020-09-12 ENCOUNTER — Other Ambulatory Visit: Payer: Self-pay | Admitting: *Deleted

## 2020-09-12 DIAGNOSIS — C8251 Diffuse follicle center lymphoma, lymph nodes of head, face, and neck: Secondary | ICD-10-CM

## 2020-09-12 DIAGNOSIS — M542 Cervicalgia: Secondary | ICD-10-CM

## 2020-09-12 LAB — PHENYTOIN LEVEL, FREE AND TOTAL
Phenytoin, Free: 0.8 ug/mL — ABNORMAL LOW (ref 1.0–2.0)
Phenytoin, Total: 8.3 ug/mL — ABNORMAL LOW (ref 10.0–20.0)

## 2020-09-13 ENCOUNTER — Emergency Department (HOSPITAL_COMMUNITY): Payer: Medicare Other

## 2020-09-13 ENCOUNTER — Inpatient Hospital Stay (HOSPITAL_COMMUNITY)
Admission: EM | Admit: 2020-09-13 | Discharge: 2020-09-16 | DRG: 552 | Disposition: A | Discharge status: Home / Self Care | Payer: Medicare Other | Attending: Internal Medicine | Admitting: Internal Medicine

## 2020-09-13 ENCOUNTER — Telehealth: Payer: Self-pay | Admitting: *Deleted

## 2020-09-13 ENCOUNTER — Encounter (HOSPITAL_COMMUNITY): Payer: Self-pay

## 2020-09-13 DIAGNOSIS — D649 Anemia, unspecified: Secondary | ICD-10-CM

## 2020-09-13 DIAGNOSIS — I10 Essential (primary) hypertension: Secondary | ICD-10-CM

## 2020-09-13 DIAGNOSIS — M542 Cervicalgia: Secondary | ICD-10-CM | POA: Diagnosis not present

## 2020-09-13 DIAGNOSIS — I6522 Occlusion and stenosis of left carotid artery: Secondary | ICD-10-CM

## 2020-09-13 DIAGNOSIS — Z20822 Contact with and (suspected) exposure to covid-19: Secondary | ICD-10-CM | POA: Diagnosis present

## 2020-09-13 DIAGNOSIS — Z9049 Acquired absence of other specified parts of digestive tract: Secondary | ICD-10-CM

## 2020-09-13 DIAGNOSIS — Z888 Allergy status to other drugs, medicaments and biological substances status: Secondary | ICD-10-CM

## 2020-09-13 DIAGNOSIS — C8290 Follicular lymphoma, unspecified, unspecified site: Secondary | ICD-10-CM

## 2020-09-13 DIAGNOSIS — C8281 Other types of follicular lymphoma, lymph nodes of head, face, and neck: Secondary | ICD-10-CM | POA: Diagnosis present

## 2020-09-13 DIAGNOSIS — E785 Hyperlipidemia, unspecified: Secondary | ICD-10-CM | POA: Diagnosis present

## 2020-09-13 DIAGNOSIS — D638 Anemia in other chronic diseases classified elsewhere: Secondary | ICD-10-CM | POA: Diagnosis present

## 2020-09-13 DIAGNOSIS — Z96691 Finger-joint replacement of right hand: Secondary | ICD-10-CM | POA: Diagnosis present

## 2020-09-13 DIAGNOSIS — C8291 Follicular lymphoma, unspecified, lymph nodes of head, face, and neck: Secondary | ICD-10-CM

## 2020-09-13 DIAGNOSIS — Z79899 Other long term (current) drug therapy: Secondary | ICD-10-CM

## 2020-09-13 DIAGNOSIS — H409 Unspecified glaucoma: Secondary | ICD-10-CM | POA: Diagnosis present

## 2020-09-13 DIAGNOSIS — Z79891 Long term (current) use of opiate analgesic: Secondary | ICD-10-CM

## 2020-09-13 DIAGNOSIS — Z7982 Long term (current) use of aspirin: Secondary | ICD-10-CM

## 2020-09-13 DIAGNOSIS — R519 Headache, unspecified: Secondary | ICD-10-CM

## 2020-09-13 LAB — BASIC METABOLIC PANEL
Anion gap: 8 (ref 5–15)
BUN: 23 mg/dL (ref 8–23)
CO2: 25 mmol/L (ref 22–32)
Calcium: 8.8 mg/dL — ABNORMAL LOW (ref 8.9–10.3)
Chloride: 100 mmol/L (ref 98–111)
Creatinine, Ser: 0.81 mg/dL (ref 0.61–1.24)
GFR, Estimated: >60 mL/min (ref >60)
Glucose, Bld: 123 mg/dL — ABNORMAL HIGH (ref 70–99)
Potassium: 4.1 mmol/L (ref 3.5–5.1)
Sodium: 133 mmol/L — ABNORMAL LOW (ref 135–145)

## 2020-09-13 LAB — CBC WITH DIFFERENTIAL/PLATELET
Abs Immature Granulocytes: 0.08 10*3/uL — ABNORMAL HIGH (ref 0.00–0.07)
Basophils Absolute: 0 10*3/uL (ref 0.0–0.1)
Basophils Relative: 0 %
Eosinophils Absolute: 0.2 10*3/uL (ref 0.0–0.5)
Eosinophils Relative: 2 %
HCT: 26.6 % — ABNORMAL LOW (ref 39.0–52.0)
Hemoglobin: 9.2 g/dL — ABNORMAL LOW (ref 13.0–17.0)
Immature Granulocytes: 1 %
Lymphocytes Relative: 2 %
Lymphs Abs: 0.2 10*3/uL — ABNORMAL LOW (ref 0.7–4.0)
MCH: 33.7 pg (ref 26.0–34.0)
MCHC: 34.6 g/dL (ref 30.0–36.0)
MCV: 97.4 fL (ref 80.0–100.0)
Monocytes Absolute: 0.8 10*3/uL (ref 0.1–1.0)
Monocytes Relative: 9 %
Neutro Abs: 8 10*3/uL — ABNORMAL HIGH (ref 1.7–7.7)
Neutrophils Relative %: 86 %
Platelets: 175 10*3/uL (ref 150–400)
RBC: 2.73 MIL/uL — ABNORMAL LOW (ref 4.22–5.81)
RDW: 14.3 % (ref 11.5–15.5)
WBC: 9.4 10*3/uL (ref 4.0–10.5)
nRBC: 0 % (ref 0.0–0.2)

## 2020-09-13 LAB — FOLATE: Folate: 6.8 ng/mL (ref >5.9)

## 2020-09-13 MED ORDER — LATANOPROST 0.005 % OP SOLN
1.0000 [drp] | Freq: Every day | OPHTHALMIC | Status: DC
  Administered 2020-09-13 – 2020-09-15 (×3): 1 [drp] via OPHTHALMIC
  Filled 2020-09-13: qty 2.5

## 2020-09-13 MED ORDER — HYDROMORPHONE HCL 1 MG/ML IJ SOLN
1.0000 mg | Freq: Once | INTRAMUSCULAR | Status: AC
  Administered 2020-09-13: 1 mg via INTRAVENOUS
  Filled 2020-09-13: qty 1

## 2020-09-13 MED ORDER — LIDOCAINE-PRILOCAINE 2.5-2.5 % EX CREA: 1.0000 "application " | TOPICAL_CREAM | Freq: Every day | CUTANEOUS | Status: DC | PRN

## 2020-09-13 MED ORDER — AMLODIPINE BESYLATE 5 MG PO TABS
5.0000 mg | ORAL_TABLET | Freq: Every day | ORAL | Status: DC
  Administered 2020-09-14 – 2020-09-15 (×2): 5 mg via ORAL
  Filled 2020-09-13 (×2): qty 1

## 2020-09-13 MED ORDER — TIMOLOL MALEATE 0.5 % OP SOLN
1.0000 [drp] | Freq: Two times a day (BID) | OPHTHALMIC | Status: DC
  Administered 2020-09-13 – 2020-09-15 (×4): 1 [drp] via OPHTHALMIC
  Filled 2020-09-13: qty 5

## 2020-09-13 MED ORDER — GABAPENTIN 100 MG PO CAPS
100.0000 mg | ORAL_CAPSULE | Freq: Every day | ORAL | Status: DC
  Administered 2020-09-13 – 2020-09-14 (×2): 100 mg via ORAL
  Filled 2020-09-13 (×2): qty 1

## 2020-09-13 MED ORDER — METOPROLOL SUCCINATE ER 25 MG PO TB24
25.0000 mg | ORAL_TABLET | Freq: Every day | ORAL | Status: DC
  Administered 2020-09-14 – 2020-09-15 (×2): 25 mg via ORAL
  Filled 2020-09-13 (×2): qty 1

## 2020-09-13 MED ORDER — VITAMIN D 25 MCG (1000 UNIT) PO TABS
1000.0000 [IU] | ORAL_TABLET | Freq: Every day | ORAL | Status: DC
  Administered 2020-09-14 – 2020-09-15 (×2): 1000 [IU] via ORAL
  Filled 2020-09-13 (×2): qty 1

## 2020-09-13 MED ORDER — PHENYTOIN SODIUM EXTENDED 100 MG PO CAPS
200.0000 mg | ORAL_CAPSULE | Freq: Two times a day (BID) | ORAL | Status: DC
  Administered 2020-09-13 – 2020-09-15 (×4): 200 mg via ORAL
  Filled 2020-09-13 (×6): qty 2

## 2020-09-13 MED ORDER — ENOXAPARIN SODIUM 40 MG/0.4ML IJ SOSY
40.0000 mg | PREFILLED_SYRINGE | INTRAMUSCULAR | Status: DC
  Administered 2020-09-13: 40 mg via SUBCUTANEOUS
  Filled 2020-09-13: qty 0.4

## 2020-09-13 MED ORDER — CYCLOBENZAPRINE HCL 5 MG PO TABS
5.0000 mg | ORAL_TABLET | Freq: Three times a day (TID) | ORAL | Status: DC | PRN
  Administered 2020-09-14 – 2020-09-15 (×2): 5 mg via ORAL
  Filled 2020-09-13 (×2): qty 1

## 2020-09-13 MED ORDER — KETOROLAC TROMETHAMINE 30 MG/ML IJ SOLN
15.0000 mg | Freq: Once | INTRAMUSCULAR | Status: AC
  Administered 2020-09-13: 15 mg via INTRAVENOUS
  Filled 2020-09-13: qty 1

## 2020-09-13 MED ORDER — HYDROMORPHONE HCL 1 MG/ML IJ SOLN
0.5000 mg | INTRAMUSCULAR | Status: DC | PRN
  Administered 2020-09-14 – 2020-09-15 (×6): 0.5 mg via INTRAVENOUS
  Filled 2020-09-13 (×4): qty 0.5
  Filled 2020-09-13: qty 1
  Filled 2020-09-13: qty 0.5

## 2020-09-13 MED ORDER — PRAVASTATIN SODIUM 20 MG PO TABS
80.0000 mg | ORAL_TABLET | Freq: Every day | ORAL | Status: DC
  Administered 2020-09-14 – 2020-09-15 (×2): 80 mg via ORAL
  Filled 2020-09-13 (×2): qty 4

## 2020-09-13 MED ORDER — ACYCLOVIR 400 MG PO TABS
400.0000 mg | ORAL_TABLET | Freq: Two times a day (BID) | ORAL | Status: DC
  Administered 2020-09-13 – 2020-09-15 (×5): 400 mg via ORAL
  Filled 2020-09-13 (×6): qty 1

## 2020-09-13 MED ORDER — ALLOPURINOL 300 MG PO TABS
300.0000 mg | ORAL_TABLET | Freq: Every day | ORAL | Status: DC
  Administered 2020-09-14 – 2020-09-15 (×2): 300 mg via ORAL
  Filled 2020-09-13 (×3): qty 1

## 2020-09-13 MED ORDER — GADOBUTROL 1 MMOL/ML IV SOLN
8.0000 mL | Freq: Once | INTRAVENOUS | Status: AC | PRN
  Administered 2020-09-13: 8 mL via INTRAVENOUS

## 2020-09-13 MED ORDER — ASPIRIN 81 MG PO CHEW
81.0000 mg | CHEWABLE_TABLET | Freq: Every day | ORAL | Status: DC
  Filled 2020-09-13 (×2): qty 1

## 2020-09-13 MED ORDER — DIAZEPAM 2 MG PO TABS
2.0000 mg | ORAL_TABLET | Freq: Once | ORAL | Status: AC
  Administered 2020-09-13: 2 mg via ORAL
  Filled 2020-09-13: qty 1

## 2020-09-13 MED ORDER — TIMOLOL HEMIHYDRATE 0.5 % OP SOLN: 1.0000 [drp] | Freq: Two times a day (BID) | OPHTHALMIC | Status: DC

## 2020-09-13 MED ORDER — MORPHINE SULFATE (PF) 2 MG/ML IV SOLN
1.0000 mg | INTRAVENOUS | Status: DC | PRN
  Administered 2020-09-14: 1 mg via INTRAVENOUS
  Filled 2020-09-13: qty 1

## 2020-09-13 MED ORDER — INDOMETHACIN 25 MG PO CAPS
50.0000 mg | ORAL_CAPSULE | Freq: Two times a day (BID) | ORAL | Status: DC
  Administered 2020-09-14 – 2020-09-16 (×5): 50 mg via ORAL
  Filled 2020-09-13 (×6): qty 2

## 2020-09-13 MED ORDER — LATANOPROSTENE BUNOD 0.024 % OP SOLN: 1.0000 [drp] | Freq: Every day | OPHTHALMIC | Status: DC

## 2020-09-13 MED ORDER — FENTANYL CITRATE (PF) 100 MCG/2ML IJ SOLN
50.0000 ug | Freq: Once | INTRAMUSCULAR | Status: AC
  Administered 2020-09-13: 50 ug via INTRAVENOUS
  Filled 2020-09-13: qty 2

## 2020-09-13 NOTE — H&P (Signed)
History and Physical    Brian Castillo JSE:[id number] DOB: 05/04/1940 DOA: 09/13/2020  PCP: Shon Baton, MD  Patient coming from: Home  I have personally briefly reviewed patient's old medical records in [place]  Chief Complaint: persistent scalp and neck pain  HPI: Brian Castillo is a 80 y.o. male with medical history significant for follicular lymphoma,HLD, HTN who presents with persistent scalp pain and left neck pain.  Patient had right port-a cath placement for chemo on 08/14/2020 and then develop posterior and occipital headache. He followed up with general surgery and was told that everything with his port looked okay.  He then received his immunotherapy injection and felt worsen pain but controlled initially with Claritin and Tylenol.  However pain progressively got worse and he started to take tramadol.  He presented to the ED on 6/26 and was given Valium, morphine, Ativan and Toradol with some relief.  He then started taking his home oxycodone with ibuprofen every 6 hours but again relief was short-lived.   He was then evaluated again in the ED on 6/29 at the advice of his oncologist for persistent pain and had work-up including CT head and soft tissue neck that was negative. Stated to take 2 oxycodone a day up to 10mg  but pain would still only go down to 6/10.  States neck feels stiff and pain is mostly occipital up to parietal area. Also has sharp shooting pain up left neck (has enlarged lymph node but on the right side of neck) and pain around exterior ear. Scalp is sensitive to touch. No numbness or tingling. Does not improve with any positioning of his head when he tries to sleep. Only thing that helps him get some sleep and rolling a towel into a donut shape to avoid minimal contact with his scalp. Denies any fever, dizziness, or vision changes. Has significant decrease ROM to neck due to pain.    ED Course: He was afebrile, normotensive.  No leukocytosis.  Hemoglobin of  9.2 which has been downward trending from 12.5 a month ago.  Sodium of 133.  BG of 123. 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  Patient still had significant shooting pain despite receiving a significant amount of pain management including Valium, fentanyl, 2 mg Dilaudid and Toradol.  Hospitalist called for admission for pain management.  Review of Systems: Constitutional: No Weight Change, No Fever ENT/Mouth: No sore throat, No Rhinorrhea Eyes: No Eye Pain, No Vision Changes Cardiovascular: No Chest Pain, no SOB Respiratory: No Cough, No Sputum Gastrointestinal: No Nausea, No Vomiting, No Diarrhea, No Constipation, No Pain Genitourinary: no Urinary Incontinence, No Urgency, No Flank Pain Musculoskeletal: No Arthralgias, No Myalgias Skin: No Skin Lesions, No Pruritus, Neuro: no Weakness, No Numbness Psych: No Anxiety/Panic, No Depression, no decrease appetite Heme/Lymph: No Bruising, No Bleeding  Past Medical History:  Diagnosis Date   Allergy    mild   Arthritis    Cancer (HCC)    Cataract    forming   Colon polyps    GERD (gastroesophageal reflux disease)    15 -20 years ago -none currently   Glaucoma    Hyperlipidemia    Hypertension    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 He has never used smokeless tobacco. He  reports current alcohol use of about 14.0 standard drinks of alcohol per week. He reports that he does not use drugs. Social History  Allergies  Allergen Reactions   Brimonidine Tartrate-Timolol Other (See Comments)   Lisinopril Hives   Netarsudil Dimesylate Other (See Comments)    Family History  Problem Relation Age of Onset   Colon cancer Neg Hx    Colon polyps Neg Hx      Prior to Admission medications   Medication Sig Start Date End Date Taking? Authorizing Provider  acyclovir (ZOVIRAX) 400 MG tablet Take 1 tablet (400 mg total) by mouth 2 (two) times daily. 05/29/20  Yes Orson Slick, MD  allopurinol (ZYLOPRIM) 300 MG tablet Take 300 mg by mouth daily.   Yes [provider]  amLODipine (NORVASC) 5 MG tablet Take 5 mg by mouth Daily.  11/28/11  Yes [provider]  aspirin 81 MG tablet Take 81 mg by mouth daily.   Yes [provider]  Calcium Carb-Cholecalciferol (CALCIUM 600 + D PO) Take 1 tablet by mouth daily.   Yes [provider]  cholecalciferol (VITAMIN D) 1000 units tablet Take 1,000 Units by mouth daily.   Yes [provider]  cyclobenzaprine (FLEXERIL) 5 MG tablet Take 1 tablet (5 mg total) by mouth 3 (three) times daily as needed for muscle spasms. 09/09/20  Yes Orson Slick, MD  ibuprofen (ADVIL) 200 MG tablet Take 400 mg by mouth every 6 (six) hours as needed for mild pain.   Yes [provider]  Latanoprostene Bunod (VYZULTA) 0.024 % SOLN Place 1 drop into both eyes at bedtime.   Yes [provider]  lidocaine-prilocaine (EMLA) cream Apply 1 application topically as needed. Patient taking differently: Apply 1 application topically daily as needed (port access). 08/28/20  Yes Orson Slick, MD  metoprolol succinate (TOPROL-XL) 25 MG 24 hr tablet Take 25 mg by mouth Daily.  11/28/11  Yes [provider]  oxyCODONE (OXY IR/ROXICODONE) 5 MG immediate release tablet Take 1-2 tablets (5-10 mg  total) by mouth every 4 (four) hours as needed for severe pain. 09/11/20  Yes Davonna Belling, MD  phenytoin (DILANTIN) 100 MG ER capsule Take 200 mg by mouth 2 (two) times daily.   Yes [provider]  pravastatin (PRAVACHOL) 80 MG tablet Take 80 mg by mouth Daily.  11/28/11  Yes [provider]  timolol (BETIMOL) 0.5 % ophthalmic solution Place 1 drop into both eyes 2 (two) times daily.   Yes [provider]  COVID-19 mRNA Vac-TriS, Pfizer, (PFIZER-BIONT COVID-19 VAC-TRIS) SUSP injection Inject into the muscle. 08/16/20   Carlyle Basques, MD  dexamethasone (DECADRON) 4 MG tablet Take 2 tablets (8 mg total) by mouth as directed. Take 2 pills (8mg ) twice daily on Days 3 and 4 of treatment Patient not taking: No sig reported 05/29/20   Orson Slick, MD  diazepam (VALIUM) 5 MG tablet  Take 1 tablet (5 mg total) by mouth every 8 (eight) hours as needed for anxiety. Patient not taking: No sig reported 09/08/20   Isla Pence, MD  ondansetron (ZOFRAN) 8 MG tablet Take 1 tablet (8 mg total) by mouth every 8 (eight) hours as needed for nausea or vomiting. Patient not taking: No sig reported 05/29/20   Orson Slick, MD  predniSONE (DELTASONE) 20 MG tablet Take 3 tablets (60 mg total) by mouth as directed. Take 3 tablets (60mg ) by mouth daily starting on Chemotherapy Day 1 and continuing for 4 days afterwards. Patient not taking: No sig reported 08/28/20   Orson Slick, MD  prochlorperazine (COMPAZINE) 10 MG tablet Take 1 tablet (10 mg total) by mouth every 6 (six) hours as needed for nausea or vomiting. Patient not taking: No sig reported 05/29/20   Orson Slick, MD  traMADol (ULTRAM) 50 MG tablet Take 1 tablet (50 mg total) by mouth every 6 (six) hours as needed. Patient not taking: No sig reported 08/21/20   Orson Slick, MD    Physical Exam: Vitals:   09/13/20 1930 09/13/20 2000 09/13/20 2030 09/13/20 2100  BP: 112/61 121/72 133/60 (!) 135/58  Pulse: 71  71 77 72  Resp: 17 14 17 13   Temp:      TempSrc:      SpO2: 100% 100% 100% 98%    Constitutional: NAD, laying at 40 degree incline on rolled up towel No obvious deformity or lesions noted to scalp. Endorse patient with light palpation of occipital and parietal scalp as well as left neck. Vitals:   09/13/20 1930 09/13/20 2000 09/13/20 2030 09/13/20 2100  BP: 112/61 121/72 133/60 (!) 135/58  Pulse: 71 71 77 72  Resp: 17 14 17 13   Temp:      TempSrc:      SpO2: 100% 100% 100% 98%   Eyes: PERRL, lids and conjunctivae normal ENMT: Mucous membranes are moist.  Mild erythema of the left external ear with pain with tugging of the ear.  Unable to visualize left tympanic membrane due to obstruction from cerumen. Neck: normal, supple, large right submandibular lymphadenopathy. Pt can barely move 5 degrees with flexion, sidebending or rotation of neck due to pain. Respiratory: clear to auscultation bilaterally, no wheezing, no crackles. Normal respiratory effort. No accessory muscle use.  Cardiovascular: Regular rate and rhythm, no murmurs / rubs / gallops. No extremity edema. Right port-a-cath Abdomen: no tenderness, no masses palpated.  Bowel sounds positive.  Musculoskeletal: no clubbing / cyanosis. No joint deformity upper and lower extremities. Good ROM, no contractures. Normal muscle tone.  Skin: no rashes, lesions, ulcers. No induration Neurologic: CN 2-12 grossly intact. Sensation intact,  Strength 5/5 in all 4.  Psychiatric: Normal judgment and insight. Alert and oriented x 3. Normal mood.     Labs on Admission: I have personally reviewed following labs and imaging studies  CBC: Recent Labs  Lab 09/11/20 0845 09/11/20 1245 09/13/20 1312  WBC 11.5* 11.6* 9.4  NEUTROABS 10.2* 10.1* 8.0*  HGB 10.3* 10.8* 9.2*  HCT 29.1* 30.8* 26.6*  MCV 95.4 97.5 97.4  PLT 114* 121* 081   Basic Metabolic Panel: Recent Labs  Lab 09/11/20 0845 09/11/20 1245 09/13/20 1312  NA 135 135 133*   K 4.2 4.7 4.1  CL 101 100 100  CO2 24 27 25   GLUCOSE 117* 120* 123*  BUN 12 12 23   CREATININE 0.81 0.64 0.81  CALCIUM 9.3 9.1 8.8*  GFR: Estimated Creatinine Clearance: 69.2 mL/min (by C-G formula based on SCr of 0.81 mg/dL). Liver Function Tests: Recent Labs  Lab 09/11/20 0845 09/11/20 1245  AST 14* 16  ALT 15 19  ALKPHOS 126 106  BILITOT 0.5 0.6  PROT 7.1 7.5  ALBUMIN 3.1* 3.8   No results for input(s): LIPASE, AMYLASE in the last 168 hours. No results for input(s): AMMONIA in the last 168 hours. Coagulation Profile: No results for input(s): INR, PROTIME in the last 168 hours. Cardiac Enzymes: No results for input(s): CKTOTAL, CKMB, CKMBINDEX, TROPONINI in the last 168 hours. BNP (last 3 results) No results for input(s): PROBNP in the last 8760 hours. HbA1C: No results for input(s): HGBA1C in the last 72 hours. CBG: No results for input(s): GLUCAP in the last 168 hours. Lipid Profile: No results for input(s): CHOL, HDL, LDLCALC, TRIG, CHOLHDL, LDLDIRECT in the last 72 hours. Thyroid Function Tests: No results for input(s): TSH, T4TOTAL, FREET4, T3FREE, THYROIDAB in the last 72 hours. Anemia Panel: No results for input(s): VITAMINB12, FOLATE, FERRITIN, TIBC, IRON, RETICCTPCT in the last 72 hours. Urine analysis:    Component Value Date/Time   COLORURINE YELLOW 06/25/2020 1950   APPEARANCEUR CLEAR 06/25/2020 1950   LABSPEC 1.018 06/25/2020 1950   PHURINE 5.5 06/25/2020 1950   GLUCOSEU NEGATIVE 06/25/2020 1950   HGBUR LARGE (A) 06/25/2020 1950   BILIRUBINUR NEGATIVE 06/25/2020 1950   KETONESUR NEGATIVE 06/25/2020 1950   PROTEINUR TRACE (A) 06/25/2020 1950   UROBILINOGEN 0.2 03/06/2016 1251   NITRITE NEGATIVE 06/25/2020 1950   LEUKOCYTESUR NEGATIVE 06/25/2020 1950    Radiological Exams on Admission: MR ANGIO HEAD WO CONTRAST  Result Date: 09/13/2020 CLINICAL DATA:  Severe head and neck pain, lymphoma EXAM: MRA HEAD WITHOUT CONTRAST TECHNIQUE: Angiographic  images of the Circle of Willis were acquired using MRA technique without intravenous contrast. COMPARISON:  No pertinent prior exam. FINDINGS: Anterior circulation: Intracranial right internal carotid artery is patent. There is diminished flow related enhancement of the left cavernous internal carotid artery with improved signal beyond the clinoid. Anterior and middle cerebral arteries are patent. Posterior circulation: Intracranial vertebral arteries are patent. Right vertebral artery is dominant. Basilar artery is patent. Major cerebellar artery origins are patent. Posterior cerebral arteries are patent. Other: None. IMPRESSION: Diminished flow related enhancement of the cavernous left ICA. However, the vessel appears normal on the concurrent contrast enhanced MRA neck. No proximal intracranial vessel occlusion. Electronically Signed   By: Macy Mis M.D.   On: 09/13/2020 18:37   MR Angiogram Neck W or Wo Contrast  Result Date: 09/13/2020 CLINICAL DATA:  Severe head and neck pain EXAM: MRA NECK WITHOUT AND WITH CONTRAST TECHNIQUE: Multiplanar and multiecho pulse sequences of the neck were obtained without and with intravenous contrast. Angiographic images of the neck were obtained using MRA technique without and with intravenous contrast. CONTRAST:  77mL GADAVIST GADOBUTROL 1 MMOL/ML IV SOLN COMPARISON:  None. FINDINGS: Great vessel origins are patent. Common, internal, and external carotid arteries are patent. There is approximately 70% stenosis at the origin of the left internal carotid. Extracranial vertebral arteries are patent. Right vertebral artery is dominant. IMPRESSION: Neck vessels are patent. 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 Electronically Signed   By: Macy Mis M.D.   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  Incidental finding on MRA -Already on aspirin.  Discussed changing pravastatin to higher intensity statin but pt wants to first discuss it with his PCP outpatient   Anemia -Hemoglobin of 9.2 on admit.  This appears to be in turn trending from 12 a month ago. -Could be due to chemotherapy but will obtain iron studies, vitamin Q72 and folate  Follicular lymphoma -follows with Dr. Lorenso Courier  -had PET scan on 08/22/2020 that actually showed resolution of left neck lymph node which means it does not account for new left sided neck pain now  HTN -Continue amlodipine, metoprolol  DVT prophylaxis:.Lovenox Code Status: Full Family Communication: Plan discussed with patient at bedside  disposition Plan: Home with observation Consults called:  Admission status: Observation  Level of care: Med-Surg  Status is: Observation  The patient remains OBS appropriate and will d/c before 2 midnights.  Dispo: The patient is from: Home              Anticipated d/c is to: Home              Patient currently is not medically stable to d/c.   Difficult to place patient No         Orene Desanctis DO [facility]   If 7PM-7AM, please contact night-coverage [email or web address]   09/13/2020, 9:16 PM

## 2020-09-13 NOTE — ED Notes (Signed)
Pt is in MRI  

## 2020-09-13 NOTE — ED Provider Notes (Signed)
Signout note  80 year old male presenting to ER with neck pain.  History of lymphoma with known lymphoma in the neck.  MRI ordered to rule out thrombosis, aneurysm, dissection.  MRI pending at time of signout  5:00 PM Received signout from [place]  Updated patient on results of MRI.  No obvious cause for his symptoms today.  Discussed the findings of the ICA stenosis.  Recommended he follow-up with his primary doctor and have periodic monitoring of this going forward.  Doubt this was a cause of his symptoms.  Patient continued to have ongoing neck pain and head pain.  Believe he would benefit from admission for further pain control.  Discussed case with Dr. Flossie Buffy who will admit.   Lucrezia Starch, MD 09/14/20 [phone]

## 2020-09-13 NOTE — Telephone Encounter (Signed)
Received call from patient . He states he is experiencing something new. He states his neck is still very stiff and painful, but now his whole head hurts severely, even to touch. He states he can't even put his glasses on because of the pain. Again, no sleep last night. His voice sounds weaker. Discussed with Dr. Lorenso Courier. Pt instructed to come to [facility] ED for further evaluation. Advised that Dr. Lorenso Courier will discuss the situation with the MD in the ED. Pt is agreeable to this and Arbie Cookey, his wife, will drive him.  Dr. Lorenso Courier to see pt is ED after arrival.

## 2020-09-13 NOTE — ED Notes (Signed)
Pt in MRI.

## 2020-09-13 NOTE — Progress Notes (Signed)
Brief Oncology Progress Note  [name] is an 80 year old male with medical history significant for refractory Hodgkin's lymphoma who presents with worsening neck/head pain. CT imaging of the head/C spine were unremarkable. In the ED MRA of the brain/ spine also revealed no clear etiology.  Recommendations: --highly unusual findings, etiology is unclear --agree with pain management per primary team --recommend neurology consult with consideration of LP. Remote possibility of leptomeningeal disease or possible infection in CSF --please call or page with any questions or concerns regarding his care  Ledell Peoples, MD Department of Hematology/Oncology [place] [facility] Phone: [phone] Pager: [phone] Email: [email or web address]

## 2020-09-13 NOTE — ED Triage Notes (Signed)
Pt arrived via POV, c/o worsening neck pain and now developing headache. Has been seen for same x2 with no relief. Endorses taking flexeril, oxycodone and ibuprofen with no relief from any.

## 2020-09-13 NOTE — ED Provider Notes (Addendum)
[facility] DEPT Provider Note   CSN: [id number] Arrival date & time: 09/13/20  1131     History Chief Complaint  Patient presents with   Neck Pain   Headache    Brian Castillo is a 80 y.o. male.  HPI    80 year old male comes in with chief complaint of neck pain and headache.  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  Patient reports that the pain started after he had right-sided port placed.  Soon after, the pain started getting better and resolved.  A week later however the pain returned.  The pain is located over the lower neck and radiates up towards the occiput.  Patient is also having pain over the scalp, the pain is worse with touch over that region.  He denies any new neurologic symptoms such as focal numbness, weakness, dizziness, vision change.  Patient is taking strong pain medications at home without significant relief.  Of note, patient has had 2 ER visits and it included CT head without contrast and CT soft tissue neck, that did not reveal any significant finding.  Patient was seen by oncology service who recommended that he come to the ER for further assessment of the pain.  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[place]) 05/29/2020   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/24/2014    Past Surgical History:  Procedure Laterality Date   CARPOMETACARPEL SUSPENSION PLASTY Right 08/24/2014   Procedure: RIGHT CMC ARTHOPLASTY WITH DOUBLE TENDON TRANSFER AND REPAIR   RECONSTRUCTION ;  Surgeon: Roseanne Kaufman, MD;  Location: [facility];  Service: Orthopedics;  Laterality: Right;   CHOLECYSTECTOMY     COLONOSCOPY     IR IMAGING GUIDED PORT INSERTION  08/14/2020   IR RADIOLOGIST EVAL & MGMT  08/20/2020   KNEE ARTHROSCOPY  2003   bilateral   POLYPECTOMY         Family History  Problem Relation Age of Onset   Colon cancer Neg Hx    Colon polyps Neg Hx     Social History   Tobacco Use   Smoking status: Former    Pack years: 0.00   Smokeless tobacco: Never   Tobacco comments:    quit 1984  Substance Use Topics   Alcohol use: Yes    Alcohol/week: 14.0 standard drinks    Types: 14 Glasses of wine per week   Drug use: No    Home Medications Prior to Admission medications   Medication Sig Start Date End Date Taking? Authorizing Provider  acyclovir (ZOVIRAX) 400 MG tablet Take 1 tablet (400 mg total) by mouth 2 (two) times daily. 05/29/20   Orson Slick, MD  amLODipine (NORVASC) 5 MG tablet Take 5 mg by mouth Daily.  11/28/11   [provider]  aspirin 81 MG tablet Take 81 mg by  mouth daily.    [provider]  Calcium Carb-Cholecalciferol (CALCIUM 600 + D PO) Take 1 tablet by mouth daily.    [provider]  cholecalciferol (VITAMIN D) 1000 units tablet Take 1,000 Units by mouth daily.    [provider]  COVID-19 mRNA Vac-TriS, Pfizer, (PFIZER-BIONT COVID-19 VAC-TRIS) SUSP injection Inject into the muscle. 08/16/20   Carlyle Basques, MD  cyclobenzaprine (FLEXERIL) 5 MG tablet Take 1 tablet (5 mg total) by mouth 3 (three) times daily as needed for muscle spasms. 09/09/20   Orson Slick, MD  dexamethasone (DECADRON) 4 MG tablet Take 2 tablets (8 mg total) by mouth as directed. Take 2 pills (8mg ) twice daily on Days 3 and 4 of treatment 05/29/20   Orson Slick, MD  diazepam (VALIUM) 5 MG tablet Take 1 tablet (5 mg total) by mouth every 8 (eight) hours as needed for anxiety. 09/08/20   Isla Pence, MD  Latanoprostene Bunod (VYZULTA) 0.024 % SOLN Place 1 drop into both eyes at bedtime.    [provider]  lidocaine-prilocaine (EMLA) cream Apply 1 application topically as needed. 08/28/20   Orson Slick, MD  metoprolol succinate (TOPROL-XL) 25 MG 24 hr tablet Take 25 mg by mouth Daily.  11/28/11   [provider]  ondansetron (ZOFRAN) 8 MG tablet Take 1 tablet (8 mg total) by mouth every 8 (eight) hours as needed for nausea or vomiting. 05/29/20   Orson Slick, MD  oxyCODONE (OXY IR/ROXICODONE) 5 MG immediate release tablet Take 1-2 tablets (5-10 mg total) by mouth every 4 (four) hours as needed for severe pain. 09/11/20   Davonna Belling, MD  phenytoin (DILANTIN) 100 MG ER capsule Take 200 mg by mouth 2 (two) times daily.    [provider]  pravastatin (PRAVACHOL) 80 MG tablet Take 80 mg by mouth Daily.  11/28/11   [provider]  predniSONE (DELTASONE) 20 MG tablet Take 3 tablets (60 mg total) by mouth as directed. Take 3 tablets (60mg ) by mouth daily starting on Chemotherapy Day 1 and continuing for 4 days afterwards. 08/28/20   Orson Slick, MD  prochlorperazine (COMPAZINE) 10 MG tablet Take 1 tablet (10 mg total) by mouth every 6 (six) hours as needed for nausea or vomiting. 05/29/20   Orson Slick, MD  timolol (BETIMOL) 0.5 % ophthalmic solution Place 1 drop into both eyes 2 (two) times daily.    [provider]  traMADol (ULTRAM) 50 MG tablet Take 1 tablet (50 mg total) by mouth every 6 (six) hours as needed. 08/21/20   Orson Slick, MD    Allergies    Brimonidine tartrate-timolol, Lisinopril, and Netarsudil dimesylate  Review of Systems   Review of Systems  Constitutional:  Positive for activity change.  Genitourinary:  Positive for hematuria.  All other systems reviewed and are negative.  Physical Exam Updated Vital Signs BP 113/61   Pulse 68   Temp 97.9 F (36.6 C) (Oral)   Resp 10   SpO2 96%    Physical Exam Vitals and nursing note reviewed.  Constitutional:      General: He is in acute distress.     Appearance: He is well-developed.  HENT:     Head: Atraumatic.  Neck:     Comments: No midline c-spine tenderness, pt able to turn head bilaterally -but it is limited and with pain   Cardiovascular:     Rate and Rhythm: Normal  rate.  Pulmonary:     Effort: Pulmonary effort is normal.  Abdominal:     Palpations: Abdomen is soft.  Skin:    General: Skin is warm.  Neurological:     Mental Status: He is alert and oriented to person, place, and time.    ED Results / Procedures / Treatments   Labs (all labs ordered are listed, but only abnormal results are displayed) Labs Reviewed  CBC WITH DIFFERENTIAL/PLATELET - Abnormal; Notable for the following components:      Result Value   RBC 2.73 (*)    Hemoglobin 9.2 (*)    HCT 26.6 (*)    Neutro Abs 8.0 (*)    Lymphs Abs 0.2 (*)    Abs Immature Granulocytes 0.08 (*)    All other components within normal limits  BASIC METABOLIC PANEL - Abnormal; Notable for the following components:   Sodium 133 (*)    Glucose, Bld 123 (*)    Calcium 8.8 (*)    All other components within normal limits    EKG None  Radiology No results found.  Procedures Procedures   Medications Ordered in ED Medications  HYDROmorphone (DILAUDID) injection 1 mg (1 mg Intravenous Given 09/13/20 1344)    ED Course  I have reviewed the triage vital signs and the nursing notes.  Pertinent labs & imaging results that were available during my care of the patient were reviewed by me and considered in my medical decision making (see chart for details).    MDM Rules/Calculators/A&P                         80 year old comes in a chief complaint of neck pain.  He is having diffuse pain that covers his neck, occiput and now also the scalp.  He has had CT scan of the head and soft tissue of the neck, that did not reveal any acute concerns.  Patient had  a large lymphoma on the right side of the neck -which the oncology does not think is the primary cause for the pain.  There is no acute neurodeficits with it.  We will get MRA of the head and neck to ensure there is no thrombosis, aneurysm or dissection.  We will also look at the tumor closely with it to see if that can explain the pain.  There is some neuropathic element to the pain.  If the MRI is negative, then patient might need admission for pain control.   4:39 PM Dr. Roslynn Amble to assume patient's care.  MRI is pending.  I will discuss the updates with the patient prior to leaving.  Final Clinical Impression(s) / ED Diagnoses Final diagnoses:  None    Rx / DC Orders ED Discharge Orders     None        Varney Biles, MD 09/13/20 [place], Niyana Chesbro, MD 09/13/20 1640

## 2020-09-13 NOTE — ED Provider Notes (Signed)
Emergency Medicine Provider Triage Evaluation Note  Brian Castillo , a 80 y.o. male  was evaluated in triage.  Pt complains of continued headache and neck pain.  He has been taking oxycodone, ibuprofen and muscle relaxer but continues to have pain.  Feels that the area is "sensitive to touch."  No injuries or falls.  No blurry vision or numbness.  Review of Systems  Positive: Headache, neck pain Negative: Vision changes, numbness  Physical Exam  BP (!) 121/59 (BP Location: Left Arm)   Pulse 71   Temp 97.9 F (36.6 C) (Oral)   Resp 16   SpO2 99%  Gen:   Awake, no distress   Resp:  Normal effort  MSK:   Moves extremities without difficulty  Other:  Pupils are equal and reactive to light  Medical Decision Making  Medically screening exam initiated at 12:24 PM.  Appropriate orders placed.  Brian Castillo was informed that the remainder of the evaluation will be completed by another provider, this initial triage assessment does not replace that evaluation, and the importance of remaining in the ED until their evaluation is complete.  Had CT imaging done of the head and neck 2 days ago.  Will likely need medication.   Delia Heady, PA-C 09/13/20 [place], MD 09/13/20 1723

## 2020-09-14 ENCOUNTER — Other Ambulatory Visit: Payer: Self-pay

## 2020-09-14 DIAGNOSIS — Z79891 Long term (current) use of opiate analgesic: Secondary | ICD-10-CM | POA: Diagnosis not present

## 2020-09-14 DIAGNOSIS — M542 Cervicalgia: Secondary | ICD-10-CM | POA: Diagnosis present

## 2020-09-14 DIAGNOSIS — Z888 Allergy status to other drugs, medicaments and biological substances status: Secondary | ICD-10-CM | POA: Diagnosis not present

## 2020-09-14 DIAGNOSIS — Z96691 Finger-joint replacement of right hand: Secondary | ICD-10-CM | POA: Diagnosis present

## 2020-09-14 DIAGNOSIS — Z20822 Contact with and (suspected) exposure to covid-19: Secondary | ICD-10-CM | POA: Diagnosis present

## 2020-09-14 DIAGNOSIS — Z7982 Long term (current) use of aspirin: Secondary | ICD-10-CM | POA: Diagnosis not present

## 2020-09-14 DIAGNOSIS — E785 Hyperlipidemia, unspecified: Secondary | ICD-10-CM | POA: Diagnosis present

## 2020-09-14 DIAGNOSIS — Z9049 Acquired absence of other specified parts of digestive tract: Secondary | ICD-10-CM | POA: Diagnosis not present

## 2020-09-14 DIAGNOSIS — Z79899 Other long term (current) drug therapy: Secondary | ICD-10-CM | POA: Diagnosis not present

## 2020-09-14 DIAGNOSIS — I6522 Occlusion and stenosis of left carotid artery: Secondary | ICD-10-CM | POA: Diagnosis present

## 2020-09-14 DIAGNOSIS — I10 Essential (primary) hypertension: Secondary | ICD-10-CM | POA: Diagnosis present

## 2020-09-14 DIAGNOSIS — H409 Unspecified glaucoma: Secondary | ICD-10-CM | POA: Diagnosis present

## 2020-09-14 DIAGNOSIS — D638 Anemia in other chronic diseases classified elsewhere: Secondary | ICD-10-CM | POA: Diagnosis present

## 2020-09-14 DIAGNOSIS — C8281 Other types of follicular lymphoma, lymph nodes of head, face, and neck: Secondary | ICD-10-CM | POA: Diagnosis present

## 2020-09-14 LAB — IRON AND TIBC
Iron: 39 ug/dL — ABNORMAL LOW (ref 45–182)
Saturation Ratios: 22 % (ref 17.9–39.5)
TIBC: 181 ug/dL — ABNORMAL LOW (ref 250–450)
UIBC: 142 ug/dL

## 2020-09-14 LAB — CBC
HCT: 27.3 % — ABNORMAL LOW (ref 39.0–52.0)
Hemoglobin: 9.3 g/dL — ABNORMAL LOW (ref 13.0–17.0)
MCH: 33.5 pg (ref 26.0–34.0)
MCHC: 34.1 g/dL (ref 30.0–36.0)
MCV: 98.2 fL (ref 80.0–100.0)
Platelets: 197 10*3/uL (ref 150–400)
RBC: 2.78 MIL/uL — ABNORMAL LOW (ref 4.22–5.81)
RDW: 14 % (ref 11.5–15.5)
WBC: 9 10*3/uL (ref 4.0–10.5)
nRBC: 0 % (ref 0.0–0.2)

## 2020-09-14 LAB — SARS CORONAVIRUS 2 (TAT 6-24 HRS): SARS Coronavirus 2: NEGATIVE

## 2020-09-14 LAB — VITAMIN B12: Vitamin B-12: 5583 pg/mL — ABNORMAL HIGH (ref 180–914)

## 2020-09-14 MED ORDER — SENNOSIDES-DOCUSATE SODIUM 8.6-50 MG PO TABS: 1.0000 | ORAL_TABLET | Freq: Every evening | ORAL | Status: DC | PRN

## 2020-09-14 MED ORDER — CHLORHEXIDINE GLUCONATE CLOTH 2 % EX PADS
6.0000 | MEDICATED_PAD | Freq: Every day | CUTANEOUS | Status: DC
  Administered 2020-09-14 – 2020-09-15 (×2): 6 via TOPICAL

## 2020-09-14 NOTE — Progress Notes (Signed)
PROGRESS NOTE    Brian Castillo  NWG:[id number] DOB: Sep 27, 1940 DOA: 09/13/2020 PCP: Shon Baton, MD   Brief Narrative:  80 year old with past medical history of refractory follicular lymphoma, HTN, HLD admitted for persistent and worsening scalp, neck pain and headache.  This has been ongoing outpatient for about a month treated with pain medications.  Has been to ER couple of times due to this, CT of the head and neck and MR has been negative.  Admitted to control his pain and get lumbar puncture.   Assessment & Plan:   Principal Problem:   Neck pain Active Problems:   Internal carotid artery stenosis, left   Anemia   Follicular lymphoma (HCC)   HTN (hypertension)  Scalp and left neck pain w/hx of follicular lymphoma, persistent -Unclear etiology.  CT head and neck and MRA of head and neck is negative. - Lumbar puncture ordered to rule out any infection or leptomeningeal disease - Pain control - We will consult neurology if necessary.   HLD/Left ICA stenosis -70%, asymptomatic. -Aspirin and statin   Anemia of chronic disease -Hemoglobin around baseline of 9.3   Follicular lymphoma -Follows with Dr. Lorenso Courier   HTN -Norvasc, metoprolol     DVT prophylaxis: DC Lovenox in anticipation for LP.  Order SCDs Code Status: Full code Family Communication:    Patient is having severe headache, difficult to treat outpatient therefore admitted to the hospital.  He will require lumbar puncture.  He has had multiple physician visit as outpatient but continues to have severe pain  Dispo: The patient is from: Home              Anticipated d/c is to: Home              Patient currently is not medically stable to d/c.   Difficult to place patient No       Subjective: Reports to me of neck stiffness and severe headache.  Denies any visual disturbances, weakness, numbness or tingling in his extremities or other parts of the body.  Review of Systems Otherwise negative except as  per HPI, including: General: Denies fever, chills, night sweats or unintended weight loss. Resp: Denies cough, wheezing, shortness of breath. Cardiac: Denies chest pain, palpitations, orthopnea, paroxysmal nocturnal dyspnea. GI: Denies abdominal pain, nausea, vomiting, diarrhea or constipation GU: Denies dysuria, frequency, hesitancy or incontinence MS: Denies muscle aches, joint pain or swelling Neuro: Denies headache, neurologic deficits (focal weakness, numbness, tingling), abnormal gait Psych: Denies anxiety, depression, SI/HI/AVH Skin: Denies new rashes or lesions ID: Denies sick contacts, exotic exposures, travel  Examination:  General exam: Appears calm and comfortable  Respiratory system: Clear to auscultation. Respiratory effort normal. Cardiovascular system: S1 & S2 heard, RRR. No JVD, murmurs, rubs, gallops or clicks. No pedal edema. Gastrointestinal system: Abdomen is nondistended, soft and nontender. No organomegaly or masses felt. Normal bowel sounds heard. Central nervous system: Alert and oriented. No focal neurological deficits. Extremities: Symmetric 5 x 5 power. Skin: No rashes, lesions or ulcers Psychiatry: Judgement and insight appear normal. Mood & affect appropriate.     Objective: Vitals:   09/14/20 0800 09/14/20 0830 09/14/20 0900 09/14/20 0915  BP: 118/74 (!) 119/56 (!) 108/54 (!) 108/54  Pulse: 80 79 76 76  Resp: 16 11 12 12   Temp: 98.6 F (37 C)  98.4 F (36.9 C) 98.4 F (36.9 C)  TempSrc: Oral  Oral Oral  SpO2: 96% 96%    Weight:    78.5 kg  Height:  5\' 8"  (1.727 m)   No intake or output data in the 24 hours ending 09/14/20 0925 Filed Weights   09/14/20 0915  Weight: 78.5 kg     Data Reviewed:   CBC: Recent Labs  Lab 09/11/20 0845 09/11/20 1245 09/13/20 1312 09/14/20 0403  WBC 11.5* 11.6* 9.4 9.0  NEUTROABS 10.2* 10.1* 8.0*  --   HGB 10.3* 10.8* 9.2* 9.3*  HCT 29.1* 30.8* 26.6* 27.3*  MCV 95.4 97.5 97.4 98.2  PLT 114* 121*  175 163   Basic Metabolic Panel: Recent Labs  Lab 09/11/20 0845 09/11/20 1245 09/13/20 1312  NA 135 135 133*  K 4.2 4.7 4.1  CL 101 100 100  CO2 24 27 25   GLUCOSE 117* 120* 123*  BUN 12 12 23   CREATININE 0.81 0.64 0.81  CALCIUM 9.3 9.1 8.8*   GFR: Estimated Creatinine Clearance: 69.2 mL/min (by C-G formula based on SCr of 0.81 mg/dL). Liver Function Tests: Recent Labs  Lab 09/11/20 0845 09/11/20 1245  AST 14* 16  ALT 15 19  ALKPHOS 126 106  BILITOT 0.5 0.6  PROT 7.1 7.5  ALBUMIN 3.1* 3.8   No results for input(s): LIPASE, AMYLASE in the last 168 hours. No results for input(s): AMMONIA in the last 168 hours. Coagulation Profile: No results for input(s): INR, PROTIME in the last 168 hours. Cardiac Enzymes: No results for input(s): CKTOTAL, CKMB, CKMBINDEX, TROPONINI in the last 168 hours. BNP (last 3 results) No results for input(s): PROBNP in the last 8760 hours. HbA1C: No results for input(s): HGBA1C in the last 72 hours. CBG: No results for input(s): GLUCAP in the last 168 hours. Lipid Profile: No results for input(s): CHOL, HDL, LDLCALC, TRIG, CHOLHDL, LDLDIRECT in the last 72 hours. Thyroid Function Tests: No results for input(s): TSH, T4TOTAL, FREET4, T3FREE, THYROIDAB in the last 72 hours. Anemia Panel: Recent Labs    09/13/20 2217  VITAMINB12 5,583*  FOLATE 6.8  TIBC 181*  IRON 39*   Sepsis Labs: No results for input(s): PROCALCITON, LATICACIDVEN in the last 168 hours.  No results found for this or any previous visit (from the past 240 hour(s)).       Radiology Studies: MR ANGIO HEAD WO CONTRAST  Result Date: 09/13/2020 CLINICAL DATA:  Severe head and neck pain, lymphoma EXAM: MRA HEAD WITHOUT CONTRAST TECHNIQUE: Angiographic images of the Circle of Willis were acquired using MRA technique without intravenous contrast. COMPARISON:  No pertinent prior exam. FINDINGS: Anterior circulation: Intracranial right internal carotid artery is patent.  There is diminished flow related enhancement of the left cavernous internal carotid artery with improved signal beyond the clinoid. Anterior and middle cerebral arteries are patent. Posterior circulation: Intracranial vertebral arteries are patent. Right vertebral artery is dominant. Basilar artery is patent. Major cerebellar artery origins are patent. Posterior cerebral arteries are patent. Other: None. IMPRESSION: Diminished flow related enhancement of the cavernous left ICA. However, the vessel appears normal on the concurrent contrast enhanced MRA neck. No proximal intracranial vessel occlusion. Electronically Signed   By: Macy Mis M.D.   On: 09/13/2020 18:37   MR Angiogram Neck W or Wo Contrast  Result Date: 09/13/2020 CLINICAL DATA:  Severe head and neck pain EXAM: MRA NECK WITHOUT AND WITH CONTRAST TECHNIQUE: Multiplanar and multiecho pulse sequences of the neck were obtained without and with intravenous contrast. Angiographic images of the neck were obtained using MRA technique without and with intravenous contrast. CONTRAST:  46mL GADAVIST GADOBUTROL 1 MMOL/ML IV SOLN COMPARISON:  None. FINDINGS: Doristine Devoid  vessel origins are patent. Common, internal, and external carotid arteries are patent. There is approximately 70% stenosis at the origin of the left internal carotid. Extracranial vertebral arteries are patent. Right vertebral artery is dominant. IMPRESSION: Neck vessels are patent. 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 Electronically Signed   By: Macy Mis M.D.   On: 09/13/2020 18:51        Scheduled Meds:  acyclovir  400 mg Oral BID   allopurinol  300 mg Oral Daily   amLODipine  5 mg Oral Daily   aspirin  81 mg Oral Daily   cholecalciferol  1,000 Units Oral Daily   enoxaparin (LOVENOX) injection  40 mg Subcutaneous Q24H   gabapentin  100 mg Oral QHS   indomethacin  50 mg Oral BID WC   latanoprost  1 drop Both Eyes QHS   metoprolol succinate  25 mg Oral Daily   phenytoin   200 mg Oral BID   pravastatin  80 mg Oral Daily   timolol  1 drop Both Eyes BID   Continuous Infusions:   LOS: 0 days   Time spent= 35 mins    Teneisha Gignac Arsenio Loader, MD [facility]  If 7PM-7AM, please contact night-coverage  09/14/2020, 9:25 AM

## 2020-09-14 NOTE — Plan of Care (Signed)
  Problem: Education: Goal: Knowledge of General Education information will improve Description Including pain rating scale, medication(s)/side effects and non-pharmacologic comfort measures Outcome: Progressing   Problem: Nutrition: Goal: Adequate nutrition will be maintained Outcome: Progressing   Problem: Pain Managment: Goal: General experience of comfort will improve Outcome: Progressing   

## 2020-09-15 ENCOUNTER — Inpatient Hospital Stay (HOSPITAL_COMMUNITY): Payer: Medicare Other

## 2020-09-15 LAB — CSF CELL COUNT WITH DIFFERENTIAL
RBC Count, CSF: 0 /mm3
Tube #: 4
WBC, CSF: 0 /mm3 (ref 0–5)

## 2020-09-15 LAB — GLUCOSE, CSF: Glucose, CSF: 68 mg/dL (ref 40–70)

## 2020-09-15 LAB — BASIC METABOLIC PANEL
Anion gap: 9 (ref 5–15)
BUN: 17 mg/dL (ref 8–23)
CO2: 26 mmol/L (ref 22–32)
Calcium: 8.5 mg/dL — ABNORMAL LOW (ref 8.9–10.3)
Chloride: 100 mmol/L (ref 98–111)
Creatinine, Ser: 0.66 mg/dL (ref 0.61–1.24)
GFR, Estimated: >60 mL/min (ref >60)
Glucose, Bld: 111 mg/dL — ABNORMAL HIGH (ref 70–99)
Potassium: 4.1 mmol/L (ref 3.5–5.1)
Sodium: 135 mmol/L (ref 135–145)

## 2020-09-15 LAB — CBC
HCT: 25.9 % — ABNORMAL LOW (ref 39.0–52.0)
Hemoglobin: 8.8 g/dL — ABNORMAL LOW (ref 13.0–17.0)
MCH: 33 pg (ref 26.0–34.0)
MCHC: 34 g/dL (ref 30.0–36.0)
MCV: 97 fL (ref 80.0–100.0)
Platelets: 221 10*3/uL (ref 150–400)
RBC: 2.67 MIL/uL — ABNORMAL LOW (ref 4.22–5.81)
RDW: 14 % (ref 11.5–15.5)
WBC: 7.9 10*3/uL (ref 4.0–10.5)
nRBC: 0 % (ref 0.0–0.2)

## 2020-09-15 LAB — MAGNESIUM: Magnesium: 2.2 mg/dL (ref 1.7–2.4)

## 2020-09-15 LAB — PROTEIN, CSF: Total  Protein, CSF: 47 mg/dL — ABNORMAL HIGH (ref 15–45)

## 2020-09-15 MED ORDER — GABAPENTIN 100 MG PO CAPS
100.0000 mg | ORAL_CAPSULE | Freq: Two times a day (BID) | ORAL | Status: DC
  Administered 2020-09-15: 100 mg via ORAL
  Filled 2020-09-15: qty 1

## 2020-09-15 MED ORDER — SODIUM CHLORIDE 0.9% FLUSH
10.0000 mL | INTRAVENOUS | Status: DC | PRN
  Administered 2020-09-15 – 2020-09-16 (×2): 10 mL

## 2020-09-15 MED ORDER — ENSURE ENLIVE PO LIQD
237.0000 mL | Freq: Two times a day (BID) | ORAL | Status: DC
  Administered 2020-09-15: 237 mL via ORAL

## 2020-09-15 NOTE — Progress Notes (Signed)
Initial Nutrition Assessment  DOCUMENTATION CODES:   Not applicable  INTERVENTION:   - Ensure Enlive po BID, each supplement provides 350 kcal and 20 grams of protein  NUTRITION DIAGNOSIS:   Increased nutrient needs related to cancer and cancer related treatments as evidenced by estimated needs.  GOAL:   Patient will meet greater than or equal to 90% of their needs  MONITOR:   PO intake, Supplement acceptance, Labs, Weight trends  REASON FOR ASSESSMENT:   Malnutrition Screening Tool    ASSESSMENT:   80 year old male with PMH of refractory Hodgkin's lymphoma undergoing chemotherapy, HLD, HTN who presents with persistent scalp pain and left neck pain. Pt admitted for pain management.  RD working remotely. Unable to reach pt via phone call to room. Noted plan for LP today to rule out any infection or leptomeningeal disease.  [place] RD's notes. Per notes, pt was eating well (3 meals daily with occasional snacks) and weight had been fairly stable. [place] has provided pt with diet education materials. Pt eating well this admission with 100% meal completions documented for last 3 meals.  Reviewed weight history in chart. Pt with very slow progressive weight loss since 07/10/20. Pt has lost a total of 1.6 kg since that time. This is a 2% weight loss which is not significant for timeframe. Will continue to monitor trends.  Pt with increased nutrient needs related to chemotherapy. Will order Ensure Enlive between meals to aid in meeting increased kcal and protein needs.  Meal Completion: 100% x 3 documented meals  Medications reviewed and include: cholecalciferol, dilantin  Labs reviewed: hemoglobin 8.8  I/O's: +840 ml since admit  NUTRITION - FOCUSED PHYSICAL EXAM:  Unable to complete at this time. RD working remotely.  Diet Order:   Diet Order             Diet Heart Room service appropriate? Yes; Fluid consistency: Thin  Diet effective now                    EDUCATION NEEDS:   No education needs have been identified at this time  Skin:  Skin Assessment: Reviewed RN Assessment  Last BM:  09/15/20  Height:   Ht Readings from Last 1 Encounters:  09/14/20 5\' 8"  (1.727 m)    Weight:   Wt Readings from Last 1 Encounters:  09/14/20 78.5 kg    BMI:  Body mass index is 26.3 kg/m.  Estimated Nutritional Needs:   Kcal:  1900-2100  Protein:  85-105 grams  Fluid:  >/= 1.8 L/day    Gustavus Bryant, MS, RD, LDN Inpatient Clinical Dietitian Please see [facility] for contact information.

## 2020-09-15 NOTE — Plan of Care (Signed)
  Problem: Pain Managment: Goal: General experience of comfort will improve Outcome: Progressing   Problem: Safety: Goal: Ability to remain free from injury will improve Outcome: Progressing   

## 2020-09-15 NOTE — Plan of Care (Signed)
?  Problem: Education: ?Goal: Knowledge of General Education information will improve ?Description: Including pain rating scale, medication(s)/side effects and non-pharmacologic comfort measures ?Outcome: Progressing ?  ?Problem: Safety: ?Goal: Ability to remain free from injury will improve ?Outcome: Progressing ?  ?Problem: Pain Managment: ?Goal: General experience of comfort will improve ?Outcome: Progressing ?  ?

## 2020-09-15 NOTE — Progress Notes (Signed)
PROGRESS NOTE    Brian Castillo  JYN:[id number] DOB: [birth year] DOA: 09/13/2020 PCP: Shon Baton, MD   Brief Narrative:  80 year old with past medical history of refractory follicular lymphoma, HTN, HLD admitted for persistent and worsening scalp, neck pain and headache.  This has been ongoing outpatient for about a month treated with pain medications.  Has been to ER couple of times due to this, CT of the head and neck and MR has been negative.  Admitted to control his pain and get lumbar puncture.   Assessment & Plan:   Principal Problem:   Neck pain Active Problems:   Internal carotid artery stenosis, left   Anemia   Follicular lymphoma (HCC)   HTN (hypertension)  Scalp and left neck pain w/hx of follicular lymphoma, persistent -Unclear etiology.  CT head and neck and MRA of head and neck is negative. - Plans for LP today - Pain control - Gabapentin 100 mg twice daily   HLD/Left ICA stenosis -70%, asymptomatic. -Aspirin and statin   Anemia of chronic disease -Hemoglobin around baseline of 9.3   Follicular lymphoma -Follows with Dr. Lorenso Courier   HTN -Norvasc, metoprolol     DVT prophylaxis: DC Lovenox in anticipation for LP.  Order SCDs Code Status: Full code Family Communication:    Patient is having severe headache, difficult to treat outpatient therefore admitted to the hospital.  He will require lumbar puncture.  He has had multiple physician visit as outpatient but continues to have severe pain  Dispo: The patient is from: Home              Anticipated d/c is to: Home              Patient currently is not medically stable to d/c.   Difficult to place patient No       Subjective: Feels ok, neck stiffness slightly better but still having quite a bit of headache.  Review of Systems Otherwise negative except as per HPI, including: General: Denies fever, chills, night sweats or unintended weight loss. Resp: Denies cough, wheezing, shortness of  breath. Cardiac: Denies chest pain, palpitations, orthopnea, paroxysmal nocturnal dyspnea. GI: Denies abdominal pain, nausea, vomiting, diarrhea or constipation GU: Denies dysuria, frequency, hesitancy or incontinence MS: Denies muscle aches, joint pain or swelling Neuro: Denies neurologic deficits (focal weakness, numbness, tingling), abnormal gait Psych: Denies anxiety, depression, SI/HI/AVH Skin: Denies new rashes or lesions ID: Denies sick contacts, exotic exposures, travel  Examination:  Constitutional: Not in acute distress Respiratory: Clear to auscultation bilaterally Cardiovascular: Normal sinus rhythm, no rubs Abdomen: Nontender nondistended good bowel sounds Musculoskeletal: No edema noted Skin: No rashes seen Neurologic: CN 2-12 grossly intact.  And nonfocal Psychiatric: Normal judgment and insight. Alert and oriented x 3. Normal mood. Objective: Vitals:   09/14/20 1323 09/14/20 1713 09/14/20 2210 09/15/20 0638  BP: 111/62 114/65 (!) 123/56 (!) 119/55  Pulse: 61 63 62 69  Resp: 18 18 16 16   Temp: 97.6 F (36.4 C) 97.6 F (36.4 C) 97.8 F (36.6 C) 98.2 F (36.8 C)  TempSrc: Oral Oral Oral Oral  SpO2: 98% 98% 96% 98%  Weight:      Height:        Intake/Output Summary (Last 24 hours) at 09/15/2020 1021 Last data filed at 09/15/2020 0930 Gross per 24 hour  Intake 840 ml  Output --  Net 840 ml   Filed Weights   09/14/20 0915  Weight: 78.5 kg     Data Reviewed:  CBC: Recent Labs  Lab 09/11/20 0845 09/11/20 1245 09/13/20 1312 09/14/20 0403 09/15/20 0607  WBC 11.5* 11.6* 9.4 9.0 7.9  NEUTROABS 10.2* 10.1* 8.0*  --   --   HGB 10.3* 10.8* 9.2* 9.3* 8.8*  HCT 29.1* 30.8* 26.6* 27.3* 25.9*  MCV 95.4 97.5 97.4 98.2 97.0  PLT 114* 121* 175 197 161   Basic Metabolic Panel: Recent Labs  Lab 09/11/20 0845 09/11/20 1245 09/13/20 1312 09/15/20 0607  NA 135 135 133* 135  K 4.2 4.7 4.1 4.1  CL 101 100 100 100  CO2 24 27 25 26   GLUCOSE 117* 120* 123*  111*  BUN 12 12 23 17   CREATININE 0.81 0.64 0.81 0.66  CALCIUM 9.3 9.1 8.8* 8.5*  MG  --   --   --  2.2   GFR: Estimated Creatinine Clearance: 70.1 mL/min (by C-G formula based on SCr of 0.66 mg/dL). Liver Function Tests: Recent Labs  Lab 09/11/20 0845 09/11/20 1245  AST 14* 16  ALT 15 19  ALKPHOS 126 106  BILITOT 0.5 0.6  PROT 7.1 7.5  ALBUMIN 3.1* 3.8   No results for input(s): LIPASE, AMYLASE in the last 168 hours. No results for input(s): AMMONIA in the last 168 hours. Coagulation Profile: No results for input(s): INR, PROTIME in the last 168 hours. Cardiac Enzymes: No results for input(s): CKTOTAL, CKMB, CKMBINDEX, TROPONINI in the last 168 hours. BNP (last 3 results) No results for input(s): PROBNP in the last 8760 hours. HbA1C: No results for input(s): HGBA1C in the last 72 hours. CBG: No results for input(s): GLUCAP in the last 168 hours. Lipid Profile: No results for input(s): CHOL, HDL, LDLCALC, TRIG, CHOLHDL, LDLDIRECT in the last 72 hours. Thyroid Function Tests: No results for input(s): TSH, T4TOTAL, FREET4, T3FREE, THYROIDAB in the last 72 hours. Anemia Panel: Recent Labs    09/13/20 2217  VITAMINB12 5,583*  FOLATE 6.8  TIBC 181*  IRON 39*   Sepsis Labs: No results for input(s): PROCALCITON, LATICACIDVEN in the last 168 hours.  Recent Results (from the past 240 hour(s))  SARS CORONAVIRUS 2 (TAT 6-24 HRS) Nasopharyngeal Nasopharyngeal Swab     Status: None   Collection Time: 09/13/20  8:41 PM   Specimen: Nasopharyngeal Swab  Result Value Ref Range Status   SARS Coronavirus 2 NEGATIVE NEGATIVE Final    Comment: (NOTE) SARS-CoV-2 target nucleic acids are NOT DETECTED.  The SARS-CoV-2 RNA is generally detectable in upper and lower respiratory specimens during the acute phase of infection. Negative results do not preclude SARS-CoV-2 infection, do not rule out co-infections with other pathogens, and should not be used as the sole basis for  treatment or other patient management decisions. Negative results must be combined with clinical observations, patient history, and epidemiological information. The expected result is Negative.  Fact Sheet for Patients: SugarRoll.be  Fact Sheet for Healthcare Providers: https://www.woods-mathews.com/  This test is not yet approved or cleared by the [place] FDA and  has been authorized for detection and/or diagnosis of SARS-CoV-2 by FDA under an Emergency Use Authorization (EUA). This EUA will remain  in effect (meaning this test can be used) for the duration of the COVID-19 declaration under Se ction 564(b)(1) of the Act, 21 U.S.C. section 360bbb-3(b)(1), unless the authorization is terminated or revoked sooner.  Performed at [facility], [place] [address] [place], [place] [address]          Radiology Studies: MR ANGIO HEAD WO CONTRAST  Result Date: 09/13/2020 CLINICAL  DATA:  Severe head and neck pain, lymphoma EXAM: MRA HEAD WITHOUT CONTRAST TECHNIQUE: Angiographic images of the Circle of Willis were acquired using MRA technique without intravenous contrast. COMPARISON:  No pertinent prior exam. FINDINGS: Anterior circulation: Intracranial right internal carotid artery is patent. There is diminished flow related enhancement of the left cavernous internal carotid artery with improved signal beyond the clinoid. Anterior and middle cerebral arteries are patent. Posterior circulation: Intracranial vertebral arteries are patent. Right vertebral artery is dominant. Basilar artery is patent. Major cerebellar artery origins are patent. Posterior cerebral arteries are patent. Other: None. IMPRESSION: Diminished flow related enhancement of the cavernous left ICA. However, the vessel appears normal on the concurrent contrast enhanced MRA neck. No proximal intracranial vessel occlusion. Electronically Signed   By: Macy Mis M.D.   On:  09/13/2020 18:37   MR Angiogram Neck W or Wo Contrast  Result Date: 09/13/2020 CLINICAL DATA:  Severe head and neck pain EXAM: MRA NECK WITHOUT AND WITH CONTRAST TECHNIQUE: Multiplanar and multiecho pulse sequences of the neck were obtained without and with intravenous contrast. Angiographic images of the neck were obtained using MRA technique without and with intravenous contrast. CONTRAST:  8mL GADAVIST GADOBUTROL 1 MMOL/ML IV SOLN COMPARISON:  None. FINDINGS: Great vessel origins are patent. Common, internal, and external carotid arteries are patent. There is approximately 70% stenosis at the origin of the left internal carotid. Extracranial vertebral arteries are patent. Right vertebral artery is dominant. IMPRESSION: Neck vessels are patent. 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 Electronically Signed   By: Macy Mis M.D.   On: 09/13/2020 18:51        Scheduled Meds:  acyclovir  400 mg Oral BID   allopurinol  300 mg Oral Daily   amLODipine  5 mg Oral Daily   aspirin  81 mg Oral Daily   Chlorhexidine Gluconate Cloth  6 each Topical Daily   cholecalciferol  1,000 Units Oral Daily   gabapentin  100 mg Oral QHS   indomethacin  50 mg Oral BID WC   latanoprost  1 drop Both Eyes QHS   metoprolol succinate  25 mg Oral Daily   phenytoin  200 mg Oral BID   pravastatin  80 mg Oral Daily   timolol  1 drop Both Eyes BID   Continuous Infusions:   LOS: 1 day   Time spent= 35 mins    Brian Lanese Arsenio Loader, MD [facility]  If 7PM-7AM, please contact night-coverage  09/15/2020, 10:21 AM

## 2020-09-16 LAB — BASIC METABOLIC PANEL
Anion gap: 7 (ref 5–15)
BUN: 18 mg/dL (ref 8–23)
CO2: 26 mmol/L (ref 22–32)
Calcium: 8.4 mg/dL — ABNORMAL LOW (ref 8.9–10.3)
Chloride: 103 mmol/L (ref 98–111)
Creatinine, Ser: 0.7 mg/dL (ref 0.61–1.24)
GFR, Estimated: >60 mL/min (ref >60)
Glucose, Bld: 116 mg/dL — ABNORMAL HIGH (ref 70–99)
Potassium: 4 mmol/L (ref 3.5–5.1)
Sodium: 136 mmol/L (ref 135–145)

## 2020-09-16 LAB — CBC
HCT: 25.8 % — ABNORMAL LOW (ref 39.0–52.0)
Hemoglobin: 8.9 g/dL — ABNORMAL LOW (ref 13.0–17.0)
MCH: 33.7 pg (ref 26.0–34.0)
MCHC: 34.5 g/dL (ref 30.0–36.0)
MCV: 97.7 fL (ref 80.0–100.0)
Platelets: 246 10*3/uL (ref 150–400)
RBC: 2.64 MIL/uL — ABNORMAL LOW (ref 4.22–5.81)
RDW: 14.2 % (ref 11.5–15.5)
WBC: 8.4 10*3/uL (ref 4.0–10.5)
nRBC: 0 % (ref 0.0–0.2)

## 2020-09-16 LAB — MAGNESIUM: Magnesium: 2.2 mg/dL (ref 1.7–2.4)

## 2020-09-16 MED ORDER — HEPARIN SOD (PORK) LOCK FLUSH 100 UNIT/ML IV SOLN
500.0000 [IU] | INTRAVENOUS | Status: AC | PRN
  Administered 2020-09-16: 500 [IU]
  Filled 2020-09-16: qty 5

## 2020-09-16 MED ORDER — SENNOSIDES-DOCUSATE SODIUM 8.6-50 MG PO TABS: 2.0000 | ORAL_TABLET | Freq: Every evening | ORAL | 0 refills | Status: DC | PRN

## 2020-09-16 MED ORDER — LIDOCAINE 5 % EX PTCH: 1.0000 | MEDICATED_PATCH | CUTANEOUS | 0 refills | Status: DC

## 2020-09-16 MED ORDER — GABAPENTIN 300 MG PO CAPS: 300.0000 mg | ORAL_CAPSULE | Freq: Two times a day (BID) | ORAL | Status: DC

## 2020-09-16 MED ORDER — LIDOCAINE 5 % EX PTCH
1.0000 | MEDICATED_PATCH | CUTANEOUS | Status: DC
  Filled 2020-09-16: qty 1

## 2020-09-16 MED ORDER — GABAPENTIN 300 MG PO CAPS: 300.0000 mg | ORAL_CAPSULE | Freq: Two times a day (BID) | ORAL | 0 refills | Status: DC

## 2020-09-16 MED ORDER — PANTOPRAZOLE SODIUM 40 MG PO TBEC: 40.0000 mg | DELAYED_RELEASE_TABLET | Freq: Every day | ORAL | 0 refills | Status: DC

## 2020-09-16 NOTE — Plan of Care (Signed)
  Problem: Pain Managment: Goal: General experience of comfort will improve Outcome: Progressing   Problem: Safety: Goal: Ability to remain free from injury will improve Outcome: Progressing   

## 2020-09-16 NOTE — Discharge Summary (Signed)
Physician Discharge Summary  Brian Castillo IEP:[id number] DOB: 1940/08/16 DOA: 09/13/2020  PCP: Shon Baton, MD  Admit date: 09/13/2020 Discharge date: 09/16/2020  Admitted From: Home Disposition:  Home  Recommendations for Outpatient Follow-up:  Follow up with PCP in 1-2 weeks Please obtain BMP/CBC in one week your next doctors visit.  Heating Pad over his neck area.  Lidocaine patch if necessary, 12 hours on 12 hours off Gabapentin 300 mg twice daily.  Can be increased as necessary Because he is on daily NSAIDs, will add PPI Continue his oxycodone and Flexeril as needed.  Bowel regimen added If necessary in the future follow-up with neurology, may need to add Tegretol Follow-up cytology results outpatient with oncology   Discharge Condition: Stable CODE STATUS: Full code Diet recommendation: 2 g salt  Brief/Interim Summary: 80 year old with past medical history of refractory follicular lymphoma, HTN, HLD admitted for persistent and worsening scalp, neck pain and headache.  This has been ongoing outpatient for about a month treated with pain medications.  Has been to ER couple of times due to this, CT of the head and neck and MR has been negative.  Admitted to control his pain and get lumbar puncture.  Lumbar puncture was unremarkable, at the discharge cytology was pending which can be followed up outpatient.  Gabapentin was increased to 300 mg twice daily.  Case was discussed with neurology, Dr. Leonel Ramsay who suggested maximizing dose of gabapentin as necessary and adding lidocaine patch.  In the future can attempt trial of Tegretol.  Advised to continue using heating pad.     Assessment & Plan:   Principal Problem:   Neck pain Active Problems:   Internal carotid artery stenosis, left   Anemia   Follicular lymphoma (HCC)   HTN (hypertension)   Scalp and left neck pain w/hx of follicular lymphoma, persistent-slightly improved -Unclear etiology.  CT head and neck and MRA of head  and neck is negative. - Continue pain control, bowel regimen added - LP performed 7/3-negative for any signs of infection.  Cytology pending which can be followed up outpatient with oncology - Lidocaine patch - Gabapentin increased to 300 mg twice daily - Case discussed with neurology, Dr. Kirkpatrick-recommended maximizing dose of gabapentin as tolerated.  If necessary in the future can try a trial of Tegretol or Lyrica as well.   HLD/Left ICA stenosis -70%, asymptomatic. -Aspirin and statin   Anemia of chronic disease -Hemoglobin around baseline of 9.3   Follicular lymphoma -Follows with Dr. Lorenso Courier.  Perhaps oncology can review his outpatient immunotherapy to see if it needs to be modified   HTN -Norvasc, metoprolol  Body mass index is 26.3 kg/m.     Discharge Diagnoses:  Principal Problem:   Neck pain Active Problems:   Internal carotid artery stenosis, left   Anemia   Follicular lymphoma (HCC)   HTN (hypertension)   Subjective: Still having little bit of neck pain and headache.  Mostly neuropathic but improved.  Discharge Exam: Vitals:   09/15/20 2105 09/16/20 0519  BP: (!) 120/58 (!) 107/58  Pulse: 64 72  Resp: 17 16  Temp: 98.1 F (36.7 C) 98 F (36.7 C)  SpO2: 98% 95%   Vitals:   09/15/20 0638 09/15/20 1412 09/15/20 2105 09/16/20 0519  BP: (!) 119/55 (!) 115/56 (!) 120/58 (!) 107/58  Pulse: 69 65 64 72  Resp: 16 16 17 16   Temp: 98.2 F (36.8 C) 98 F (36.7 C) 98.1 F (36.7 C) 98 F (36.7 C)  TempSrc: Oral Oral Oral Oral  SpO2: 98% 99% 98% 95%  Weight:      Height:        General: Pt is alert, awake, not in acute distress Cardiovascular: RRR, S1/S2 +, no rubs, no gallops Respiratory: CTA bilaterally, no wheezing, no rhonchi Abdominal: Soft, NT, ND, bowel sounds + Extremities: no edema, no cyanosis  Discharge Instructions   Allergies as of 09/16/2020       Reactions   Brimonidine Tartrate-timolol Other (See Comments)   Lisinopril Hives    Netarsudil Dimesylate Other (See Comments)        Medication List     STOP taking these medications    dexamethasone 4 MG tablet Commonly known as: DECADRON   diazepam 5 MG tablet Commonly known as: VALIUM   ondansetron 8 MG tablet Commonly known as: ZOFRAN   Pfizer-BioNT COVID-19 Vac-TriS Susp injection Generic drug: COVID-19 mRNA Vac-TriS (Pfizer)   predniSONE 20 MG tablet Commonly known as: DELTASONE   prochlorperazine 10 MG tablet Commonly known as: COMPAZINE   traMADol 50 MG tablet Commonly known as: ULTRAM       TAKE these medications    acyclovir 400 MG tablet Commonly known as: ZOVIRAX Take 1 tablet (400 mg total) by mouth 2 (two) times daily.   allopurinol 300 MG tablet Commonly known as: ZYLOPRIM Take 300 mg by mouth daily.   amLODipine 5 MG tablet Commonly known as: NORVASC Take 5 mg by mouth Daily.   aspirin 81 MG tablet Take 81 mg by mouth daily.   CALCIUM 600 + D PO Take 1 tablet by mouth daily.   cholecalciferol 1000 units tablet Commonly known as: VITAMIN D Take 1,000 Units by mouth daily.   cyclobenzaprine 5 MG tablet Commonly known as: FLEXERIL Take 1 tablet (5 mg total) by mouth 3 (three) times daily as needed for muscle spasms.   gabapentin 300 MG capsule Commonly known as: NEURONTIN Take 1 capsule (300 mg total) by mouth 2 (two) times daily.   ibuprofen 200 MG tablet Commonly known as: ADVIL Take 400 mg by mouth every 6 (six) hours as needed for mild pain.   lidocaine 5 % Commonly known as: LIDODERM Place 1 patch onto the skin daily. Remove & Discard patch within 12 hours or as directed by MD   metoprolol succinate 25 MG 24 hr tablet Commonly known as: TOPROL-XL Take 25 mg by mouth Daily.   oxyCODONE 5 MG immediate release tablet Commonly known as: Oxy IR/ROXICODONE Take 1-2 tablets (5-10 mg total) by mouth every 4 (four) hours as needed for severe pain.   pantoprazole 40 MG tablet Commonly known as:  Protonix Take 1 tablet (40 mg total) by mouth daily before breakfast.   phenytoin 100 MG ER capsule Commonly known as: DILANTIN Take 200 mg by mouth 2 (two) times daily.   pravastatin 80 MG tablet Commonly known as: PRAVACHOL Take 80 mg by mouth Daily.   senna-docusate 8.6-50 MG tablet Commonly known as: Senokot-S Take 2 tablets by mouth at bedtime as needed for moderate constipation.   timolol 0.5 % ophthalmic solution Commonly known as: BETIMOL Place 1 drop into both eyes 2 (two) times daily.   Vyzulta 0.024 % Soln Generic drug: Latanoprostene Bunod Place 1 drop into both eyes at bedtime.       ASK your doctor about these medications    lidocaine-prilocaine cream Commonly known as: EMLA Apply 1 application topically as needed.        Follow-up Information  Shon Baton, MD Follow up in 1 week(s).   Specialty: Internal Medicine Contact information: [place] [address] [phone]                Allergies  Allergen Reactions   Brimonidine Tartrate-Timolol Other (See Comments)   Lisinopril Hives   Netarsudil Dimesylate Other (See Comments)    You were cared for by a hospitalist during your hospital stay. If you have any questions about your discharge medications or the care you received while you were in the hospital after you are discharged, you can call the unit and asked to speak with the hospitalist on call if the hospitalist that took care of you is not available. Once you are discharged, your primary care physician will handle any further medical issues. Please note that no refills for any discharge medications will be authorized once you are discharged, as it is imperative that you return to your primary care physician (or establish a relationship with a primary care physician if you do not have one) for your aftercare needs so that they can reassess your need for medications and monitor your lab  values.   Procedures/Studies: CT Head Wo Contrast  Result Date: 09/11/2020 CLINICAL DATA:  Headache.  History of lymphoma. EXAM: CT HEAD WITHOUT CONTRAST TECHNIQUE: Contiguous axial images were obtained from the base of the skull through the vertex without intravenous contrast. COMPARISON:  None. FINDINGS: Brain: No evidence of acute infarction, hemorrhage, hydrocephalus, extra-axial collection or mass lesion/mass effect. Vascular: Negative for hyperdense vessel Skull: Negative Sinuses/Orbits: Paranasal sinuses clear.  Negative orbit Other: None IMPRESSION: Negative CT head Electronically Signed   By: Franchot Gallo M.D.   On: 09/11/2020 15:39   CT Soft Tissue Neck W Contrast  Result Date: 09/11/2020 CLINICAL DATA:  Posterior neck pain on the left. Enlarged right lymph node. History of lymphoma currently on treatment. EXAM: CT NECK WITH CONTRAST TECHNIQUE: Multidetector CT imaging of the neck was performed using the standard protocol following the bolus administration of intravenous contrast. CONTRAST:  47mL OMNIPAQUE IOHEXOL 300 MG/ML  SOLN COMPARISON:  CT neck 04/29/2020.  PET CT 08/22/2020 FINDINGS: Pharynx and larynx: Normal. No mass or swelling. Bilateral tonsilliths Salivary glands: Previously noted large necrotic lymph node anterior to the right submandibular gland is significantly smaller now measuring 14 mm in diameter. Adjacent submandibular gland is normal. Left submandibular gland normal. Normal parotid bilaterally. Thyroid: Negative Lymph nodes: Interval development of large right lateral lymph node mass lateral to the submandibular gland. This shows peripheral enhancement with irregular areas of necrosis. This measures approximately 3.3 x 5 cm and was not present on the prior neck CT however was markedly hypermetabolic on recent PET. Multiple small posterior and inferior lymph nodes in the neck elsewhere have improved significantly since the prior CT. Vascular: Port-A-Cath tip right jugular  vein extending into the SVC. Normal vascular enhancement. Atherosclerotic disease in the carotid bifurcation bilaterally. 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 Visualized orbits: Not imaged Mastoids and visualized paranasal sinuses: Negative Skeleton: Cervical spondylosis.  No acute skeletal abnormality. Upper chest: Lung apices clear bilaterally. Other: None IMPRESSION: Large lymph node mass in the right lateral neck measuring 3.3 x 5 cm. This was markedly hypermetabolic on recent PET and is consistent with tumor. This has developed since the prior neck CT of 04/29/2020. In this patient with a history of  lymphoma, this could be a new area of lymphoma versus metastatic carcinoma. Interval improvement in lymph nodes elsewhere in the neck including anterior to the right submandibular gland and in the posteroinferior neck bilaterally. Electronically Signed   By: Franchot Gallo M.D.   On: 09/11/2020 15:38   MR ANGIO HEAD WO CONTRAST  Result Date: 09/13/2020 CLINICAL DATA:  Severe head and neck pain, lymphoma EXAM: MRA HEAD WITHOUT CONTRAST TECHNIQUE: Angiographic images of the Circle of Willis were acquired using MRA technique without intravenous contrast. COMPARISON:  No pertinent prior exam. FINDINGS: Anterior circulation: Intracranial right internal carotid artery is patent. There is diminished flow related enhancement of the left cavernous internal carotid artery with improved signal beyond the clinoid. Anterior and middle cerebral arteries are patent. Posterior circulation: Intracranial vertebral arteries are patent. Right vertebral artery is dominant. Basilar artery is patent. Major cerebellar artery origins are patent. Posterior cerebral arteries are patent. Other: None. IMPRESSION: Diminished flow related enhancement of the cavernous left ICA. However, the  vessel appears normal on the concurrent contrast enhanced MRA neck. No proximal intracranial vessel occlusion. Electronically Signed   By: Macy Mis M.D.   On: 09/13/2020 18:37   MR Angiogram Neck W or Wo Contrast  Result Date: 09/13/2020 CLINICAL DATA:  Severe head and neck pain EXAM: MRA NECK WITHOUT AND WITH CONTRAST TECHNIQUE: Multiplanar and multiecho pulse sequences of the neck were obtained without and with intravenous contrast. Angiographic images of the neck were obtained using MRA technique without and with intravenous contrast. CONTRAST:  71mL GADAVIST GADOBUTROL 1 MMOL/ML IV SOLN COMPARISON:  None. FINDINGS: Great vessel origins are patent. Common, internal, and external carotid arteries are patent. There is approximately 70% stenosis at the origin of the left internal carotid. Extracranial vertebral arteries are patent. Right vertebral artery is dominant. IMPRESSION: Neck vessels are patent. 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 Electronically Signed   By: Macy Mis M.D.   On: 09/13/2020 18:51   NM PET Image Restag (PS) Skull Base To Thigh  Result Date: 08/23/2020 CLINICAL DATA:  Subsequent treatment strategy for follicular lymphoma. EXAM: NUCLEAR MEDICINE PET SKULL BASE TO THIGH TECHNIQUE: 8.7 mCi F-18 FDG was injected intravenously. Full-ring PET imaging was performed from the skull base to thigh after the radiotracer. CT data was obtained and used for attenuation correction and anatomic localization. Fasting blood glucose: 115 mg/dl COMPARISON:  December of 2021. FINDINGS: Mediastinal blood pool activity: SUV max 3.1 Liver activity: SUV max 4.48 NECK: RIGHT level IB lymph node has enlarged since the previous study. This now measures approximately 3.8 x 5.0 cm with a maximum SUV of 23.1 as compared to 14.2 on the prior study (image 37/4). Lymph nodes in the LEFT neck have resolved. Previous lymph node at the LEFT level II level that showed a maximum SUV of 10.3 posterior to the  sternocleidomastoid on the prior study is no longer visible. No additional signs of nodal disease in the neck. Incidental CT findings: none CHEST: No hypermetabolic mediastinal or hilar nodes. Scattered nodular foci show improvement since previous imaging. There is a stable nodule in the LEFT mid chest (image 45/8) 7 mm without significant FDG uptake maximum SUV less than 1. Other scattered small nodules in the chest many of which have resolved. In an area that previously showed branching tree-in-bud changes there is a focal nodule that measures 8 x 5 mm with a maximum SUV of 1.5. Airways are patent. Incidental CT findings: Calcified atheromatous plaque of the  thoracic aorta. Normal heart size without pericardial effusion. Normal caliber of the central pulmonary vessels. RIGHT-sided Port-A-Cath terminates at the caval to atrial junction. Limited assessment of cardiovascular structures given lack of intravenous contrast. Esophagus mildly patulous. ABDOMEN/PELVIS: Resolution of small lymph node in the gastrohepatic recess otherwise negative appearance of the abdomen and pelvis, no signs of FDG avid disease. Incidental CT findings: Post cholecystectomy. Unremarkable appearance of liver, spleen, pancreas, adrenal glands and kidneys. Urinary bladder is collapsed. No hydronephrosis. Stable haziness of the jejunal mesentery, remains nonspecific with calcifications in the ileal mesentery. Calcified atheromatous plaque of the abdominal aorta. No aneurysmal dilation. Prostate with enlargement as before. Small fat containing umbilical hernia and LEFT inguinal hernia. SKELETON: No focal hypermetabolic activity to suggest skeletal metastasis. Incidental CT findings: Spinal degenerative changes. IMPRESSION: Deauville 5 changes with marked enlargement of submandibular lymph node on the RIGHT with markedly increased metabolic activity over previous imaging. Resolution of LEFT neck lymph nodes and lymph node in the upper abdomen.  These areas are no longer visible. No additional new or progressive findings. Scattered pulmonary nodules favored to represent post infectious or inflammatory changes. Attention on follow-up. Stable haziness of jejunal mesentery. Attention on follow-up. Not associated with adenopathy or significant hypermetabolism. Electronically Signed   By: Zetta Bills M.D.   On: 08/23/2020 15:17   IR Radiologist Eval & Mgmt  Result Date: 08/20/2020 Please refer to notes tab for details about interventional procedure. (Op Note)  DG FLUORO GUIDE LUMBAR PUNCTURE  Result Date: 09/15/2020 CLINICAL DATA:  Headache.  History of follicular lymphoma. EXAM: DIAGNOSTIC LUMBAR PUNCTURE UNDER FLUOROSCOPIC GUIDANCE COMPARISON:  CT head 09/11/2020 FLUOROSCOPY TIME:  Fluoroscopy Time:  42 seconds Radiation Exposure Index (if provided by the fluoroscopic device): 13.2 mGy Number of Acquired Spot Images: 0 PROCEDURE: Informed consent was obtained from the patient prior to the procedure, including potential complications of headache, allergy, and pain. With the patient prone, the lower back was prepped with Betadine. 1% Lidocaine was used for local anesthesia. Lumbar puncture was performed at the L3-4 level using a 22 gauge needle with return of 8 CSF with an opening pressure of 15 cm water. 16 ml of CSF were obtained for laboratory studies. The patient tolerated the procedure well and there were no apparent complications. IMPRESSION: 1. Technically successful lumbar puncture. The opening pressure was 15 cm of water. 16 cc of clear CSF were removed and sent to laboratory for analysis. Electronically Signed   By: Kerby Moors M.D.   On: 09/15/2020 12:57     The results of significant diagnostics from this hospitalization (including imaging, microbiology, ancillary and laboratory) are listed below for reference.     Microbiology: Recent Results (from the past 240 hour(s))  SARS CORONAVIRUS 2 (TAT 6-24 HRS) Nasopharyngeal  Nasopharyngeal Swab     Status: None   Collection Time: 09/13/20  8:41 PM   Specimen: Nasopharyngeal Swab  Result Value Ref Range Status   SARS Coronavirus 2 NEGATIVE NEGATIVE Final    Comment: (NOTE) SARS-CoV-2 target nucleic acids are NOT DETECTED.  The SARS-CoV-2 RNA is generally detectable in upper and lower respiratory specimens during the acute phase of infection. Negative results do not preclude SARS-CoV-2 infection, do not rule out co-infections with other pathogens, and should not be used as the sole basis for treatment or other patient management decisions. Negative results must be combined with clinical observations, patient history, and epidemiological information. The expected result is Negative.  Fact Sheet for Patients: SugarRoll.be  Fact Sheet for  Healthcare Providers: https://www.woods-mathews.com/  This test is not yet approved or cleared by the [place] and  has been authorized for detection and/or diagnosis of SARS-CoV-2 by FDA under an Emergency Use Authorization (EUA). This EUA will remain  in effect (meaning this test can be used) for the duration of the COVID-19 declaration under Se ction 564(b)(1) of the Act, 21 U.S.C. section 360bbb-3(b)(1), unless the authorization is terminated or revoked sooner.  Performed at [facility], [place] [address] [place], [place] [address]   CSF culture w Gram Stain     Status: None (Preliminary result)   Collection Time: 09/15/20 12:27 PM   Specimen: PATH Cytology CSF; Cerebrospinal Fluid  Result Value Ref Range Status   Specimen Description CSF  Final   Special Requests NONE  Final   Gram Stain   Final    NO WBC SEEN NO ORGANISMS SEEN RESULT CALLED TO, READ BACK BY AND VERIFIED WITH: RN Denyse Amass [facility] [place] AT 6759 09/15/20 [place] A Performed at [facility], [place] [address], [place], [place] [address]    Culture PENDING  Incomplete   Report  Status PENDING  Incomplete  Anaerobic culture w Gram Stain     Status: None (Preliminary result)   Collection Time: 09/15/20 12:27 PM   Specimen: PATH Cytology CSF; Cerebrospinal Fluid  Result Value Ref Range Status   Specimen Description   Final    CSF Performed at [facility], [place] [address] [place], [place] [address]    Special Requests   Final    NONE Performed at [facility], [place] [address] [place], [place] [address]    Gram Stain   Final    WBC PRESENT,BOTH PMN AND MONONUCLEAR NO ORGANISMS SEEN CYTOSPIN SMEAR Performed at [facility], [place] [address] [place], [place] [address]    Culture PENDING  Incomplete   Report Status PENDING  Incomplete     Labs: BNP (last 3 results) No results for input(s): BNP in the last 8760 hours. Basic Metabolic Panel: Recent Labs  Lab 09/11/20 0845 09/11/20 1245 09/13/20 1312 09/15/20 0607 09/16/20 0330  NA 135 135 133* 135 136  K 4.2 4.7 4.1 4.1 4.0  CL 101 100 100 100 103  CO2 24 27 25 26 26   GLUCOSE 117* 120* 123* 111* 116*  BUN 12 12 23 17 18   CREATININE 0.81 0.64 0.81 0.66 0.70  CALCIUM 9.3 9.1 8.8* 8.5* 8.4*  MG  --   --   --  2.2 2.2   Liver Function Tests: Recent Labs  Lab 09/11/20 0845 09/11/20 1245  AST 14* 16  ALT 15 19  ALKPHOS 126 106  BILITOT 0.5 0.6  PROT 7.1 7.5  ALBUMIN 3.1* 3.8   No results for input(s): LIPASE, AMYLASE in the last 168 hours. No results for input(s): AMMONIA in the last 168 hours. CBC: Recent Labs  Lab 09/11/20 0845 09/11/20 1245 09/13/20 1312 09/14/20 0403 09/15/20 0607 09/16/20 0330  WBC 11.5* 11.6* 9.4 9.0 7.9 8.4  NEUTROABS 10.2* 10.1* 8.0*  --   --   --   HGB 10.3* 10.8* 9.2* 9.3* 8.8* 8.9*  HCT 29.1* 30.8* 26.6* 27.3* 25.9* 25.8*  MCV 95.4 97.5 97.4 98.2 97.0 97.7  PLT 114* 121* 175 197 221 246   Cardiac Enzymes: No results for input(s): CKTOTAL, CKMB, CKMBINDEX, TROPONINI in the last 168 hours. BNP: Invalid  input(s): POCBNP CBG: No results for input(s): GLUCAP in the last 168  hours. D-Dimer No results for input(s): DDIMER in the last 72 hours. Hgb A1c No results for input(s): HGBA1C in the last 72 hours. Lipid Profile No results for input(s): CHOL, HDL, LDLCALC, TRIG, CHOLHDL, LDLDIRECT in the last 72 hours. Thyroid function studies No results for input(s): TSH, T4TOTAL, T3FREE, THYROIDAB in the last 72 hours.  Invalid input(s): FREET3 Anemia work up Recent Labs    09/13/20 2217  VITAMINB12 5,583*  FOLATE 6.8  TIBC 181*  IRON 39*   Urinalysis    Component Value Date/Time   COLORURINE YELLOW 06/25/2020 [place] 06/25/2020 1950   LABSPEC 1.018 06/25/2020 1950   PHURINE 5.5 06/25/2020 1950   GLUCOSEU NEGATIVE 06/25/2020 1950   HGBUR LARGE (A) 06/25/2020 1950   BILIRUBINUR NEGATIVE 06/25/2020 1950   KETONESUR NEGATIVE 06/25/2020 1950   PROTEINUR TRACE (A) 06/25/2020 1950   UROBILINOGEN 0.2 03/06/2016 1251   NITRITE NEGATIVE 06/25/2020 1950   LEUKOCYTESUR NEGATIVE 06/25/2020 1950   Sepsis Labs Invalid input(s): PROCALCITONIN,  WBC,  LACTICIDVEN Microbiology Recent Results (from the past 240 hour(s))  SARS CORONAVIRUS 2 (TAT 6-24 HRS) Nasopharyngeal Nasopharyngeal Swab     Status: None   Collection Time: 09/13/20  8:41 PM   Specimen: Nasopharyngeal Swab  Result Value Ref Range Status   SARS Coronavirus 2 NEGATIVE NEGATIVE Final    Comment: (NOTE) SARS-CoV-2 target nucleic acids are NOT DETECTED.  The SARS-CoV-2 RNA is generally detectable in upper and lower respiratory specimens during the acute phase of infection. Negative results do not preclude SARS-CoV-2 infection, do not rule out co-infections with other pathogens, and should not be used as the sole basis for treatment or other patient management decisions. Negative results must be combined with clinical observations, patient history, and epidemiological information. The expected result is  Negative.  Fact Sheet for Patients: SugarRoll.be  Fact Sheet for Healthcare Providers: https://www.woods-mathews.com/  This test is not yet approved or cleared by the [place] FDA and  has been authorized for detection and/or diagnosis of SARS-CoV-2 by FDA under an Emergency Use Authorization (EUA). This EUA will remain  in effect (meaning this test can be used) for the duration of the COVID-19 declaration under Se ction 564(b)(1) of the Act, 21 U.S.C. section 360bbb-3(b)(1), unless the authorization is terminated or revoked sooner.  Performed at [facility], [place] [address], [place], [place] [address]   CSF culture w Gram Stain     Status: None (Preliminary result)   Collection Time: 09/15/20 12:27 PM   Specimen: PATH Cytology CSF; Cerebrospinal Fluid  Result Value Ref Range Status   Specimen Description CSF  Final   Special Requests NONE  Final   Gram Stain   Final    NO WBC SEEN NO ORGANISMS SEEN RESULT CALLED TO, READ BACK BY AND VERIFIED WITH: RN Denyse Amass [facility] [facility] AT 9201 09/15/20 [place] Performed at [facility], [place] [address] [place], [place] [address]    Culture PENDING  Incomplete   Report Status PENDING  Incomplete  Anaerobic culture w Gram Stain     Status: None (Preliminary result)   Collection Time: 09/15/20 12:27 PM   Specimen: PATH Cytology CSF; Cerebrospinal Fluid  Result Value Ref Range Status   Specimen Description   Final    CSF Performed at [facility], [place] [address] [place], [place] [address]    Special Requests   Final    NONE Performed at [facility], [address] [place],  [place] [address]    Gram Stain   Final    WBC PRESENT,BOTH PMN AND MONONUCLEAR NO ORGANISMS SEEN CYTOSPIN SMEAR Performed at [facility], [place] [address], [place], [place] [address]    Culture PENDING  Incomplete   Report Status PENDING  Incomplete      Time coordinating discharge:  I have spent 35 minutes face to face with the patient and on the ward discussing the patients care, assessment, plan and disposition with other care givers. >50% of the time was devoted counseling the patient about the risks and benefits of treatment/Discharge disposition and coordinating care.   SIGNED:   Damita Lack, MD  [facility] 09/16/2020, 7:45 AM   If 7PM-7AM, please contact night-coverage

## 2020-09-18 ENCOUNTER — Other Ambulatory Visit: Payer: Self-pay

## 2020-09-18 ENCOUNTER — Ambulatory Visit: Payer: Medicare Other | Attending: Hematology and Oncology | Admitting: Physical Therapy

## 2020-09-18 ENCOUNTER — Encounter: Payer: Self-pay | Admitting: Physical Therapy

## 2020-09-18 DIAGNOSIS — M542 Cervicalgia: Secondary | ICD-10-CM | POA: Diagnosis not present

## 2020-09-18 DIAGNOSIS — M62838 Other muscle spasm: Secondary | ICD-10-CM | POA: Diagnosis present

## 2020-09-18 LAB — CSF CULTURE W GRAM STAIN
Culture: NO GROWTH
Gram Stain: NONE SEEN

## 2020-09-18 NOTE — Therapy (Signed)
[facility] [address], [place], [place], [address] Phone: [phone]   Fax:  [phone]  Physical Therapy Evaluation  Patient Details  Name: Brian Castillo MRN: [id number] Date of Birth: December 17, 1940 Referring Provider (PT): Ledell Peoples IV   Encounter Date: 09/18/2020   PT End of Session - 09/18/20 0833     Visit Number 1    Date for PT Re-Evaluation 11/13/20    Authorization Type UHC MCR    PT Start Time 0834    PT Stop Time 0918    PT Time Calculation (min) 44 min    Activity Tolerance Patient tolerated treatment well    Behavior During Therapy [facility] for tasks assessed/performed             Past Medical History:  Diagnosis Date   Allergy    mild   Arthritis    Cancer ([place])    Cataract    forming   Colon polyps    GERD (gastroesophageal reflux disease)    15 -20 years ago -none currently   Glaucoma    Hyperlipidemia    Hypertension    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    Subjective Assessment - 09/18/20 0834     Subjective Patient began having neck pain in early June in right side then up back of neck and into head. He went to the ER last Wed. CT scans negative. Returned 2 days later. Admitted to hospital Sat. MRI done Sat found nothing. Did spinal tap Sunday - negative. They think part of it is nerve pain so  taking gabapentin. His neck is still very tight and painful bilaterally.    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    Currently in Pain? Yes    Pain Score 4     Pain Location Neck    Pain Orientation Right;Left    Pain Descriptors / Indicators Aching;Shooting    Pain Type Acute pain    Pain Radiating Towards into top of head    Pain Onset More than a month ago    Pain Frequency Constant    Aggravating Factors  sleeping, end range movement extension and rotation    Pain Relieving Factors Gabapentin has helped some    Effect of Pain on Daily Activities unable to sleep well                [facility] PT Assessment - 09/18/20 0001       Assessment   Medical Diagnosis neck pain    Referring Provider (PT) Ledell Peoples IV    Onset Date/Surgical Date 08/14/20  Hand Dominance Right    Next MD Visit this Friday    Prior Therapy thumbs, neck      Precautions   Precautions Other (comment)    Precaution Comments h/o seizures (1998 last surgery)      Restrictions   Weight Bearing Restrictions No      Balance Screen   Has the patient fallen in the past 6 months No    Has the patient had a decrease in activity level because of a fear of falling?  No    Is the patient reluctant to leave their home because of a fear of falling?  No      Home Ecologist residence      Prior Function   Level of Independence Independent    Vocation Retired      Observation/Other Assessments   Focus on Therapeutic Outcomes (FOTO)  FS = 48      Posture/Postural Control   Posture/Postural Control Postural limitations    Postural Limitations Rounded Shoulders;Forward head      ROM / Strength   AROM / PROM / Strength AROM;Strength      AROM   AROM Assessment Site Cervical    Cervical Flexion full    Cervical  Extension 40    Cervical - Right Side Bend 19    Cervical - Left Side Bend 30    Cervical - Right Rotation 32    Cervical - Left Rotation 40      Strength   Overall Strength Comments grossly 5/5 except cerv retraction 4-/5; extension painful      Palpation   Spinal mobility decreased PA mobility C6/7/T1    Palpation comment bil UT tight, tight and tender in bil paraspinals, subocciptitals R>L, tender in bil SCM      Special Tests   Other special tests relief with manual traction in sitting as well as traction with rotation to the left; right still painful at end range.                        Objective measurements completed on examination: See above findings.       [facility] Adult PT Treatment/Exercise - 09/18/20 0001       Exercises   Exercises Neck      Neck Exercises: Seated   Cervical Isometrics Extension;Right lateral flexion;Left lateral flexion;5 secs;5 reps    Neck Retraction 5 reps    Neck Retraction Limitations cues for good posture and to relax upper traps    Other Seated Exercise manual traction with strap 5 x 5 sec holds    Other Seated Exercise scapular retraction x 5      Neck Exercises: Supine   Cervical Isometrics Extension;Right lateral flexion;Left lateral flexion;5 secs;5 reps    Neck Retraction 5 reps    Neck Retraction Limitations performs better sitting    Other Supine Exercise manual traction with strap 5 x 5 sec holds    Other Supine Exercise scapular retraction x 5                    PT Education - 09/18/20 0924     Education Details HEP    Person(s) Educated Patient    Methods Explanation;Demonstration;Handout    Comprehension Verbalized understanding;Returned demonstration                 PT Long Term Goals - 09/18/20 0941       PT  LONG TERM GOAL #1   Title Ind with progression of advanced HEP    Time 8    Period Weeks    Status New    Target Date 11/13/20      PT LONG TERM GOAL #2   Title Patient  reporting decreased neck pain with ADLs by >=75%    Time 8    Period Weeks    Status New      PT LONG TERM GOAL #3   Title Patient to demo functional neck extension in order to apply his eye drops with 75% less pain.    Time 8    Period Weeks    Status New      PT LONG TERM GOAL #4   Title Patient able to sleep without waking from neck pain.    Time 8    Period Weeks    Status New      PT LONG TERM GOAL #5   Title Patient to demo improved bil neck rotation to >=45 deg to ease ADLs.    Time 8    Period Weeks    Status New      Additional Long Term Goals   Additional Long Term Goals Yes      PT LONG TERM GOAL #6   Title Improved FS score to >= 60                    Plan - 09/18/20 0924     Clinical Impression Statement Patient presents with c/o of bil neck pain beginning about one month ago with insdious onset. Patient had shortly before had a chemo port inserted and it's possible it was due to his head positioning during the procedure. He has had f/u CT, MRI and spinal tap with neg results. Pain has decreased somewhat since starting Gabapentin, but he continues to have significant pain in the neck with extension and rotation. Pain affects ADLs and his ability to sleep. He also reports pain into the top of his head. He has decreased cervical rotation, SB and extension with pain at end ranges. He has decreased PA mobility in his lower cervical spine, tightness in his cervical paraspinals and suboccipitals, Rt>Lt. He reports relief with manual traction. He has had PT in the past for his neck and has a home traction unit. Due to the position of his cancerous tumor, mechanical traction may not be feasible, but should be assessed. Patient demonstrates poor posture in general with significant forward head and rounded shoulders. He is able to correct with cueing but would benefit from postural strengthening. Patient will benefit from PT to address these deficits.    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, h/o seizures (none since 1998)    Examination-Activity Limitations Sleep;Hygiene/Grooming    Stability/Clinical Decision Making Stable/Uncomplicated    Clinical Decision Making Low    Rehab Potential Excellent    PT Frequency 2x / week    PT Duration 8 weeks    PT Treatment/Interventions ADLs/Self Care Home Management;Cryotherapy;Electrical Stimulation;Moist Heat;Traction;Therapeutic exercise;Therapeutic activities;Neuromuscular re-education;Manual techniques;Patient/family education;Dry needling;Taping    PT Next Visit Plan Review HEP, manual/DN to cervical paraspinals/suboccipitals,manual traction, postural strengthening,  trial of mechanical traction for comfort with tumor    PT [place] and Agree with Plan of Care Patient             Patient will benefit  from skilled therapeutic intervention in order to improve the following deficits and impairments:  Decreased range of motion, Increased muscle spasms, Pain, Hypomobility, Impaired flexibility, Postural dysfunction, Decreased strength  Visit Diagnosis: Cervicalgia - Plan: PT plan of care cert/re-cert  Other muscle spasm - Plan: PT plan of care cert/re-cert     Problem List Patient Active Problem List   Diagnosis Date Noted   Neck pain 09/13/2020   Internal carotid artery stenosis, left 09/13/2020   Anemia 49/17/9150   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, [place], [place], [address] Phone: [phone]   Fax:  [phone]  Name: Brian Castillo MRN:  [id number] Date of Birth: 09-15-1940

## 2020-09-18 NOTE — Patient Instructions (Signed)
Access Code: [email or web address] URL: [email or web address] Date: 09/18/2020 Prepared by: Almyra Free  Exercises Seated Cervical Retraction - 3 x daily - 7 x weekly - 1 sets - 10 reps - 3-5 sec hold Seated Scapular Retraction - 1 x daily - 7 x weekly - 1-3 sets - 10 reps - 2-3 sec hold Seated Cervical Traction - 1 sets - 3 reps - 20 sec hold Standing Isometric Cervical Extension with Manual Resistance - 1 x daily - 7 x weekly - 1 sets - 5 reps - 10 sec hold Standing Isometric Cervical Sidebending with Manual Resistance - 1 x daily - 7 x weekly - 1 sets - 5 reps - 10 sec hold hold

## 2020-09-19 ENCOUNTER — Encounter: Payer: Self-pay | Admitting: Hematology and Oncology

## 2020-09-19 ENCOUNTER — Telehealth: Payer: Self-pay | Admitting: *Deleted

## 2020-09-19 NOTE — Telephone Encounter (Signed)
[name] patient to follow up with his pain status. Spoke with him. He states his pain is still there but better than last week. Advised that most but not all results are back from his LP and there are no abnormalities showing up.  Pt did go to Physical Therapy yesterday and that seems to be helping and he is doing the exercises at home.  He has 6 visits planned at PT for now. He is aware of his appts tomorrow  with Dr. Lorenso Courier and infusion.

## 2020-09-20 ENCOUNTER — Inpatient Hospital Stay: Payer: Medicare Other | Attending: Hematology and Oncology

## 2020-09-20 ENCOUNTER — Encounter: Payer: Self-pay | Admitting: Hematology and Oncology

## 2020-09-20 ENCOUNTER — Other Ambulatory Visit: Payer: Self-pay

## 2020-09-20 ENCOUNTER — Inpatient Hospital Stay (HOSPITAL_BASED_OUTPATIENT_CLINIC_OR_DEPARTMENT_OTHER): Payer: Medicare Other | Admitting: Hematology and Oncology

## 2020-09-20 ENCOUNTER — Inpatient Hospital Stay: Payer: Medicare Other

## 2020-09-20 ENCOUNTER — Ambulatory Visit: Payer: Medicare Other | Admitting: Hematology and Oncology

## 2020-09-20 ENCOUNTER — Other Ambulatory Visit: Payer: Self-pay | Admitting: Hematology and Oncology

## 2020-09-20 VITALS — BP 104/49 | HR 58 | Temp 97.9°F | Resp 16 | Wt 174.0 lb

## 2020-09-20 DIAGNOSIS — Z5111 Encounter for antineoplastic chemotherapy: Secondary | ICD-10-CM | POA: Diagnosis not present

## 2020-09-20 DIAGNOSIS — Z95828 Presence of other vascular implants and grafts: Secondary | ICD-10-CM

## 2020-09-20 DIAGNOSIS — C8251 Diffuse follicle center lymphoma, lymph nodes of head, face, and neck: Secondary | ICD-10-CM

## 2020-09-20 DIAGNOSIS — C8511 Unspecified B-cell lymphoma, lymph nodes of head, face, and neck: Secondary | ICD-10-CM | POA: Diagnosis present

## 2020-09-20 DIAGNOSIS — G893 Neoplasm related pain (acute) (chronic): Secondary | ICD-10-CM | POA: Insufficient documentation

## 2020-09-20 DIAGNOSIS — Z5112 Encounter for antineoplastic immunotherapy: Secondary | ICD-10-CM

## 2020-09-20 LAB — CMP (CANCER CENTER ONLY)
ALT: 22 U/L (ref 0–44)
AST: 17 U/L (ref 15–41)
Albumin: 2.7 g/dL — ABNORMAL LOW (ref 3.5–5.0)
Alkaline Phosphatase: 101 U/L (ref 38–126)
Anion gap: 9 (ref 5–15)
BUN: 13 mg/dL (ref 8–23)
CO2: 24 mmol/L (ref 22–32)
Calcium: 9 mg/dL (ref 8.9–10.3)
Chloride: 105 mmol/L (ref 98–111)
Creatinine: 0.79 mg/dL (ref 0.61–1.24)
GFR, Estimated: >60 mL/min (ref >60)
Glucose, Bld: 169 mg/dL — ABNORMAL HIGH (ref 70–99)
Potassium: 4.1 mmol/L (ref 3.5–5.1)
Sodium: 138 mmol/L (ref 135–145)
Total Bilirubin: <0.2 mg/dL — ABNORMAL LOW (ref 0.3–1.2)
Total Protein: 6.5 g/dL (ref 6.5–8.1)

## 2020-09-20 LAB — ANAEROBIC CULTURE W GRAM STAIN

## 2020-09-20 LAB — CBC WITH DIFFERENTIAL (CANCER CENTER ONLY)
Abs Immature Granulocytes: 0.04 10*3/uL (ref 0.00–0.07)
Basophils Absolute: 0 10*3/uL (ref 0.0–0.1)
Basophils Relative: 1 %
Eosinophils Absolute: 0.3 10*3/uL (ref 0.0–0.5)
Eosinophils Relative: 5 %
HCT: 24 % — ABNORMAL LOW (ref 39.0–52.0)
Hemoglobin: 8.4 g/dL — ABNORMAL LOW (ref 13.0–17.0)
Immature Granulocytes: 1 %
Lymphocytes Relative: 4 %
Lymphs Abs: 0.2 10*3/uL — ABNORMAL LOW (ref 0.7–4.0)
MCH: 33.6 pg (ref 26.0–34.0)
MCHC: 35 g/dL (ref 30.0–36.0)
MCV: 96 fL (ref 80.0–100.0)
Monocytes Absolute: 0.5 10*3/uL (ref 0.1–1.0)
Monocytes Relative: 8 %
Neutro Abs: 5.5 10*3/uL (ref 1.7–7.7)
Neutrophils Relative %: 81 %
Platelet Count: 266 10*3/uL (ref 150–400)
RBC: 2.5 MIL/uL — ABNORMAL LOW (ref 4.22–5.81)
RDW: 13.9 % (ref 11.5–15.5)
WBC Count: 6.6 10*3/uL (ref 4.0–10.5)
nRBC: 0 % (ref 0.0–0.2)

## 2020-09-20 LAB — LACTATE DEHYDROGENASE: LDH: 223 U/L — ABNORMAL HIGH (ref 98–192)

## 2020-09-20 LAB — URIC ACID: Uric Acid, Serum: 4.1 mg/dL (ref 3.7–8.6)

## 2020-09-20 MED ORDER — SODIUM CHLORIDE 0.9 % IV SOLN
375.0000 mg/m2 | Freq: Once | INTRAVENOUS | Status: AC
  Administered 2020-09-20: 700 mg via INTRAVENOUS
  Filled 2020-09-20: qty 20

## 2020-09-20 MED ORDER — DIPHENHYDRAMINE HCL 25 MG PO CAPS
ORAL_CAPSULE | ORAL | Status: AC
  Filled 2020-09-20: qty 2

## 2020-09-20 MED ORDER — SODIUM CHLORIDE 0.9% FLUSH
10.0000 mL | Freq: Once | INTRAVENOUS | Status: AC
  Administered 2020-09-20: 10 mL
  Filled 2020-09-20: qty 10

## 2020-09-20 MED ORDER — HEPARIN SOD (PORK) LOCK FLUSH 100 UNIT/ML IV SOLN
500.0000 [IU] | Freq: Once | INTRAVENOUS | Status: AC | PRN
  Administered 2020-09-20: 500 [IU]
  Filled 2020-09-20: qty 5

## 2020-09-20 MED ORDER — ACETAMINOPHEN 325 MG PO TABS
ORAL_TABLET | ORAL | Status: AC
  Filled 2020-09-20: qty 2

## 2020-09-20 MED ORDER — PREDNISONE 20 MG PO TABS
60.0000 mg | ORAL_TABLET | Freq: Once | ORAL | Status: AC
  Administered 2020-09-20: 60 mg via ORAL

## 2020-09-20 MED ORDER — DOXORUBICIN HCL CHEMO IV INJECTION 2 MG/ML
25.0000 mg/m2 | Freq: Once | INTRAVENOUS | Status: AC
  Administered 2020-09-20: 48 mg via INTRAVENOUS
  Filled 2020-09-20: qty 24

## 2020-09-20 MED ORDER — SODIUM CHLORIDE 0.9 % IV SOLN
400.0000 mg/m2 | Freq: Once | INTRAVENOUS | Status: AC
  Administered 2020-09-20: 780 mg via INTRAVENOUS
  Filled 2020-09-20: qty 39

## 2020-09-20 MED ORDER — DIPHENHYDRAMINE HCL 25 MG PO CAPS
50.0000 mg | ORAL_CAPSULE | Freq: Once | ORAL | Status: AC
  Administered 2020-09-20: 50 mg via ORAL

## 2020-09-20 MED ORDER — ACETAMINOPHEN 325 MG PO TABS
650.0000 mg | ORAL_TABLET | Freq: Once | ORAL | Status: AC
  Administered 2020-09-20: 650 mg via ORAL

## 2020-09-20 MED ORDER — VINCRISTINE SULFATE CHEMO INJECTION 1 MG/ML
1.0000 mg | Freq: Once | INTRAVENOUS | Status: AC
  Administered 2020-09-20: 1 mg via INTRAVENOUS
  Filled 2020-09-20: qty 1

## 2020-09-20 MED ORDER — SODIUM CHLORIDE 0.9 % IV SOLN
Freq: Once | INTRAVENOUS | Status: AC
  Filled 2020-09-20: qty 250

## 2020-09-20 MED ORDER — PALONOSETRON HCL INJECTION 0.25 MG/5ML
0.2500 mg | Freq: Once | INTRAVENOUS | Status: AC
  Administered 2020-09-20: 0.25 mg via INTRAVENOUS

## 2020-09-20 MED ORDER — SODIUM CHLORIDE 0.9% FLUSH
10.0000 mL | INTRAVENOUS | Status: DC | PRN
  Administered 2020-09-20: 10 mL
  Filled 2020-09-20: qty 10

## 2020-09-20 MED ORDER — PALONOSETRON HCL INJECTION 0.25 MG/5ML
INTRAVENOUS | Status: AC
  Filled 2020-09-20: qty 5

## 2020-09-20 MED ORDER — FOSAPREPITANT DIMEGLUMINE INJECTION 150 MG
150.0000 mg | Freq: Once | INTRAVENOUS | Status: AC
  Administered 2020-09-20: 150 mg via INTRAVENOUS
  Filled 2020-09-20: qty 150

## 2020-09-20 NOTE — Patient Instructions (Signed)
[facility] ONCOL[facility]  Discharge Instructions: Thank you for choosing [facility] to provide your oncology and hematology care.   If you have a lab appointment with the [facility], please go directly to the [facility] and check in at the registration area.   Wear comfortable clothing and clothing appropriate for easy access to any Portacath or PICC line.   We strive to give you quality time with your provider. You may need to reschedule your appointment if you arrive late (15 or more minutes).  Arriving late affects you and other patients whose appointments are after yours.  Also, if you miss three or more appointments without notifying the office, you may be dismissed from the clinic at the provider's discretion.      For prescription refill requests, have your pharmacy contact our office and allow 72 hours for refills to be completed.    Today you received the following chemotherapy and/or immunotherapy agents : Adriamycin, Vincristine, Cytoxan, Rituxan    To help prevent nausea and vomiting after your treatment, we encourage you to take your nausea medication as directed.  BELOW ARE SYMPTOMS THAT SHOULD BE REPORTED IMMEDIATELY: *FEVER GREATER THAN 100.4 F (38 C) OR HIGHER *CHILLS OR SWEATING *NAUSEA AND VOMITING THAT IS NOT CONTROLLED WITH YOUR NAUSEA MEDICATION *UNUSUAL SHORTNESS OF BREATH *UNUSUAL BRUISING OR BLEEDING *URINARY PROBLEMS (pain or burning when urinating, or frequent urination) *BOWEL PROBLEMS (unusual diarrhea, constipation, pain near the anus) TENDERNESS IN MOUTH AND THROAT WITH OR WITHOUT PRESENCE OF ULCERS (sore throat, sores in mouth, or a toothache) UNUSUAL RASH, SWELLING OR PAIN  UNUSUAL VAGINAL DISCHARGE OR ITCHING   Items with * indicate a potential emergency and should be followed up as soon as possible or go to the Emergency Department if any problems should occur.  Please show the CHEMOTHERAPY ALERT CARD or  IMMUNOTHERAPY ALERT CARD at check-in to the Emergency Department and triage nurse.  Should you have questions after your visit or need to cancel or reschedule your appointment, please contact [facility]  Dept: [phone]  and follow the prompts.  Office hours are 8:00 a.m. to 4:30 p.m. Monday - Friday. Please note that voicemails left after 4:00 p.m. may not be returned until the following business day.  We are closed weekends and major holidays. You have access to a nurse at all times for urgent questions. Please call the main number to the clinic Dept: [phone] and follow the prompts.   For any non-urgent questions, you may also contact your provider using MyChart. We now offer e-Visits for anyone 1 and older to request care online for non-urgent symptoms. For details visit [email or web address].   Also download the MyChart app! Go to the app store, search "MyChart", open the app, select [facility], and log in with your MyChart username and password.  Due to Covid, a mask is required upon entering the hospital/clinic. If you do not have a mask, one will be given to you upon arrival. For doctor visits, patients may have 1 support person aged 44 or older with them. For treatment visits, patients cannot have anyone with them due to current Covid guidelines and our immunocompromised population.

## 2020-09-20 NOTE — Progress Notes (Signed)
Received verbal order for 60mg  prednisone from Dr. Lorenso Courier since patient did not take at home today.

## 2020-09-20 NOTE — Patient Instructions (Signed)

## 2020-09-20 NOTE — Progress Notes (Signed)
[place] [phone]   [phone]  PROGRESS NOTE  Patient Care Team: Shon Baton, MD as PCP - General (Internal Medicine)  Hematological/Oncological History #Follicular lymphoma Stage III 1) 01/30/2020: CT neck shows numerous enlarged lymph nodes, especially in the right submandibular space - which is the palpable abnormality. 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 Lorenso Courier  4) 02/22/2020: PET CT scan showed Deauville 4 and Deauville 5 adenopathy bilaterally in the neck. There is also a Deauville 4 left supraclavicular lymph node and a Deauville 4 right gastric lymph node 5) 04/24/2020: presents with rapid enlargement of his right submandibular lymph node.  6) 05/20/2020: partial excision of right submandibular lymph node with attempted fluid drainage. Pathology consistent with follicular lymphoma, no clear evidence of transformation.  7) 06/12/2020: Cycle 1 Day 1 of R-Benda. 8) 07/10/2020: Cycle 2 Day 1 of R-Benda. 9) 08/07/2020: Cycle 3 Day 1 of R-Benda. Noted to have enlargement of right cervical lymph node on exam.  10) 08/28/2020: Cycle 1 Day 1 of R-miniCHOP  Interval History:  Key J Ellinger 80 y.o. 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 The patient's last visit was on 09/11/2020. In the interim since the last visit his right cervical lymph node has begun to grown again and he had hospitalization/ED visit due to his neck pain.  On exam today Mr. Doggett is seen in infusion.  He reports he is actually getting some relief from his neck pain.  He notes that the pain is only about a 3 or 4 out of 10 in severity.  He notes that the gabapentin appears to be working and he is taking 300 mg twice daily.  In particular it is helping him sleep at night.  He reports that the pain tends to come and go and there is much  sensitivity on the top of his head.  He reports that he is not having any issues with fevers, chills, sweats, nausea, vomiting or diarrhea.  He tolerated the last cycle of chemotherapy well.  He is willing and able to proceed with treatment at this time.  Unfortunately he does note today that the lymph node appears to be increasing in size again.  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   Seizures ([place])    last seizure was in 1988    SURGICAL HISTORY: Past Surgical History:  Procedure Laterality Date   CARPOMETACARPEL SUSPENSION PLASTY Right 08/24/2014   Procedure: RIGHT CMC ARTHOPLASTY WITH DOUBLE TENDON TRANSFER AND REPAIR  RECONSTRUCTION ;  Surgeon: Roseanne Kaufman, MD;  Location: [facility];  Service: Orthopedics;  Laterality: Right;   CHOLECYSTECTOMY     COLONOSCOPY     IR IMAGING GUIDED PORT INSERTION  08/14/2020   IR RADIOLOGIST EVAL & MGMT  08/20/2020   KNEE ARTHROSCOPY  2003   bilateral   POLYPECTOMY      SOCIAL HISTORY: Social History   Socioeconomic History   Marital status: Married    Spouse  name: Not on file   Number of children: Not on file   Years of education: Not on file   Highest education level: Not on file  Occupational History   Not on file  Tobacco Use   Smoking status: Former    Pack years: 0.00   Smokeless tobacco: Never   Tobacco comments:    quit 1984  Substance and Sexual Activity   Alcohol use: Yes    Alcohol/week: 14.0 standard drinks    Types: 14 Glasses of wine per week   Drug use: No   Sexual activity: Not on file  Other Topics Concern   Not on file  Social History Narrative   Not on file   Social Determinants of Health   Financial Resource  Strain: Not on file  Food Insecurity: Not on file  Transportation Needs: Not on file  Physical Activity: Not on file  Stress: Not on file  Social Connections: Not on file  Intimate Partner Violence: Not on file    FAMILY HISTORY: Family History  Problem Relation Age of Onset   Colon cancer Neg Hx    Colon polyps Neg Hx     ALLERGIES:  is allergic to brimonidine tartrate-timolol, lisinopril, and netarsudil dimesylate.  MEDICATIONS:  Current Outpatient Medications  Medication Sig Dispense Refill   acyclovir (ZOVIRAX) 400 MG tablet Take 1 tablet (400 mg total) by mouth 2 (two) times daily. 60 tablet 5   allopurinol (ZYLOPRIM) 300 MG tablet Take 300 mg by mouth daily.     amLODipine (NORVASC) 5 MG tablet Take 5 mg by mouth Daily.      aspirin 81 MG tablet Take 81 mg by mouth daily.     Calcium Carb-Cholecalciferol (CALCIUM 600 + D PO) Take 1 tablet by mouth daily.     cholecalciferol (VITAMIN D) 1000 units tablet Take 1,000 Units by mouth daily.     cyclobenzaprine (FLEXERIL) 5 MG tablet Take 1 tablet (5 mg total) by mouth 3 (three) times daily as needed for muscle spasms. 30 tablet 0   gabapentin (NEURONTIN) 300 MG capsule Take 1 capsule (300 mg total) by mouth 2 (two) times daily. 60 capsule 0   ibuprofen (ADVIL) 200 MG tablet Take 400 mg by mouth every 6 (six) hours as needed for mild pain.     Latanoprostene Bunod (VYZULTA) 0.024 % SOLN Place 1 drop into both eyes at bedtime.     lidocaine (LIDODERM) 5 % Place 1 patch onto the skin daily. Remove & Discard patch within 12 hours or as directed by MD 30 patch 0   lidocaine-prilocaine (EMLA) cream Apply 1 application topically as needed. (Patient taking differently: Apply 1 application topically daily as needed (port access).) 30 g 0   metoprolol succinate (TOPROL-XL) 25 MG 24 hr tablet Take 25 mg by mouth Daily.      oxyCODONE (OXY IR/ROXICODONE) 5 MG immediate release tablet Take 1-2 tablets (5-10 mg total) by mouth every 4 (four)  hours as needed for severe pain. 20 tablet 0   pantoprazole (PROTONIX) 40 MG tablet Take 1 tablet (40 mg total) by mouth daily before breakfast. 30 tablet 0   phenytoin (DILANTIN) 100 MG ER capsule Take 200 mg by mouth 2 (two) times daily.     pravastatin (PRAVACHOL) 80 MG tablet Take 80 mg by mouth Daily.      senna-docusate (SENOKOT-S) 8.6-50 MG tablet Take 2 tablets by mouth at bedtime as needed for moderate constipation. 60 tablet 0   timolol (BETIMOL)  0.5 % ophthalmic solution Place 1 drop into both eyes 2 (two) times daily.     No current facility-administered medications for this visit.    REVIEW OF SYSTEMS:   Constitutional: ( - ) fevers, ( - )  chills , ( - ) night sweats Eyes: ( - ) blurriness of vision, ( - ) double vision, ( - ) watery eyes Ears, nose, mouth, throat, and face: ( - ) mucositis, ( - ) sore throat Respiratory: ( - ) cough, ( - ) dyspnea, ( - ) wheezes Cardiovascular: ( - ) palpitation, ( - ) chest discomfort, ( - ) lower extremity swelling Gastrointestinal:  ( - ) nausea, ( - ) heartburn, ( - ) change in bowel habits Skin: ( - ) abnormal skin rashes Lymphatics: ( - ) new lymphadenopathy, ( - ) easy bruising Neurological: ( - ) numbness, ( - ) tingling, ( - ) new weaknesses Behavioral/Psych: ( - ) mood change, ( - ) new changes  All other systems were reviewed with the patient and are negative.  PHYSICAL EXAMINATION: ECOG PERFORMANCE STATUS: 1 - Symptomatic but completely ambulatory  There were no vitals filed for this visit.  There were no vitals filed for this visit.   GENERAL: well appearing elderly Caucasian male in NAD  SKIN: skin color, texture, turgor are normal, no rashes or significant lesions EYES: conjunctiva are pink and non-injected, sclera clear NECK: supple, non-tender LYMPH:  Single palpable right sided submandibular lymph node. Otherwise no other marked palpable lymphadenopathy in the cervical, axillary or supraclavicular lymph nodes.   LUNGS: clear to auscultation and percussion with normal breathing effort HEART: regular rate & rhythm and no murmurs and no lower extremity edema Musculoskeletal: no cyanosis of digits and no clubbing  PSYCH: alert & oriented x 3, fluent speech NEURO: no focal motor/sensory deficits  LABORATORY DATA:  I have reviewed the data as listed CBC Latest Ref Rng & Units 09/20/2020 09/16/2020 09/15/2020  WBC 4.0 - 10.5 K/uL 6.6 8.4 7.9  Hemoglobin 13.0 - 17.0 g/dL 8.4(L) 8.9(L) 8.8(L)  Hematocrit 39.0 - 52.0 % 24.0(L) 25.8(L) 25.9(L)  Platelets 150 - 400 K/uL 266 246 221    CMP Latest Ref Rng & Units 09/20/2020 09/16/2020 09/15/2020  Glucose 70 - 99 mg/dL 169(H) 116(H) 111(H)  BUN 8 - 23 mg/dL 13 18 17   Creatinine 0.61 - 1.24 mg/dL 0.79 0.70 0.66  Sodium 135 - 145 mmol/L 138 136 135  Potassium 3.5 - 5.1 mmol/L 4.1 4.0 4.1  Chloride 98 - 111 mmol/L 105 103 100  CO2 22 - 32 mmol/L 24 26 26   Calcium 8.9 - 10.3 mg/dL 9.0 8.4(L) 8.5(L)  Total Protein 6.5 - 8.1 g/dL 6.5 - -  Total Bilirubin 0.3 - 1.2 mg/dL <0.2(L) - -  Alkaline Phos 38 - 126 U/L 101 - -  AST 15 - 41 U/L 17 - -  ALT 0 - 44 U/L 22 - -   RADIOGRAPHIC STUDIES: I have personally reviewed the radiological images as listed and agreed with the findings in the report: stable lymphadenopathy with exception of the rapidly enlarging submandibular lymph node.   CT Head Wo Contrast  Result Date: 09/11/2020 CLINICAL DATA:  Headache.  History of lymphoma. EXAM: CT HEAD WITHOUT CONTRAST TECHNIQUE: Contiguous axial images were obtained from the base of the skull through the vertex without intravenous contrast. COMPARISON:  None. FINDINGS: Brain: No evidence of acute infarction, hemorrhage, hydrocephalus, extra-axial collection or mass lesion/mass effect. Vascular: Negative for hyperdense vessel  Skull: Negative Sinuses/Orbits: Paranasal sinuses clear.  Negative orbit Other: None IMPRESSION: Negative CT head Electronically Signed   By: Franchot Gallo M.D.    On: 09/11/2020 15:39   CT Soft Tissue Neck W Contrast  Result Date: 09/11/2020 CLINICAL DATA:  Posterior neck pain on the left. Enlarged right lymph node. History of lymphoma currently on treatment. EXAM: CT NECK WITH CONTRAST TECHNIQUE: Multidetector CT imaging of the neck was performed using the standard protocol following the bolus administration of intravenous contrast. CONTRAST:  62mL OMNIPAQUE IOHEXOL 300 MG/ML  SOLN COMPARISON:  CT neck 04/29/2020.  PET CT 08/22/2020 FINDINGS: Pharynx and larynx: Normal. No mass or swelling. Bilateral tonsilliths Salivary glands: Previously noted large necrotic lymph node anterior to the right submandibular gland is significantly smaller now measuring 14 mm in diameter. Adjacent submandibular gland is normal. Left submandibular gland normal. Normal parotid bilaterally. Thyroid: Negative Lymph nodes: Interval development of large right lateral lymph node mass lateral to the submandibular gland. This shows peripheral enhancement with irregular areas of necrosis. This measures approximately 3.3 x 5 cm and was not present on the prior neck CT however was markedly hypermetabolic on recent PET. Multiple small posterior and inferior lymph nodes in the neck elsewhere have improved significantly since the prior CT. Vascular: Port-A-Cath tip right jugular vein extending into the SVC. Normal vascular enhancement. Atherosclerotic disease in the carotid bifurcation bilaterally. 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 Visualized orbits: Not imaged Mastoids and visualized paranasal sinuses: Negative Skeleton: Cervical spondylosis.  No acute skeletal abnormality. Upper chest: Lung apices clear bilaterally. Other: None IMPRESSION: Large lymph node mass in the right lateral neck measuring 3.3 x 5 cm. This was markedly hypermetabolic on recent  PET and is consistent with tumor. This has developed since the prior neck CT of 04/29/2020. In this patient with a history of lymphoma, this could be a new area of lymphoma versus metastatic carcinoma. Interval improvement in lymph nodes elsewhere in the neck including anterior to the right submandibular gland and in the posteroinferior neck bilaterally. Electronically Signed   By: Franchot Gallo M.D.   On: 09/11/2020 15:38   MR ANGIO HEAD WO CONTRAST  Result Date: 09/13/2020 CLINICAL DATA:  Severe head and neck pain, lymphoma EXAM: MRA HEAD WITHOUT CONTRAST TECHNIQUE: Angiographic images of the Circle of Willis were acquired using MRA technique without intravenous contrast. COMPARISON:  No pertinent prior exam. FINDINGS: Anterior circulation: Intracranial right internal carotid artery is patent. There is diminished flow related enhancement of the left cavernous internal carotid artery with improved signal beyond the clinoid. Anterior and middle cerebral arteries are patent. Posterior circulation: Intracranial vertebral arteries are patent. Right vertebral artery is dominant. Basilar artery is patent. Major cerebellar artery origins are patent. Posterior cerebral arteries are patent. Other: None. IMPRESSION: Diminished flow related enhancement of the cavernous left ICA. However, the vessel appears normal on the concurrent contrast enhanced MRA neck. No proximal intracranial vessel occlusion. Electronically Signed   By: Macy Mis M.D.   On: 09/13/2020 18:37   MR Angiogram Neck W or Wo Contrast  Result Date: 09/13/2020 CLINICAL DATA:  Severe head and neck pain EXAM: MRA NECK WITHOUT AND WITH CONTRAST TECHNIQUE: Multiplanar and multiecho pulse sequences of the neck were obtained without and with intravenous contrast. Angiographic images of the neck were obtained using MRA technique without and with intravenous contrast. CONTRAST:  23mL GADAVIST GADOBUTROL 1 MMOL/ML IV SOLN COMPARISON:  None. FINDINGS: Great  vessel origins are patent. Common, internal, and external carotid arteries are patent. There is approximately 70% stenosis at the origin of the left internal carotid. Extracranial vertebral arteries are patent. Right vertebral artery is dominant. IMPRESSION: Neck vessels are patent. 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 Electronically Signed   By: Macy Mis M.D.   On: 09/13/2020 18:51   NM PET Image Restag (PS) Skull Base To Thigh  Result Date: 08/23/2020 CLINICAL DATA:  Subsequent treatment strategy for follicular lymphoma. EXAM: NUCLEAR MEDICINE PET SKULL BASE TO THIGH TECHNIQUE: 8.7 mCi F-18 FDG was injected intravenously. Full-ring PET imaging was performed from the skull base to thigh after the radiotracer. CT data was obtained and used for attenuation correction and anatomic localization. Fasting blood glucose: 115 mg/dl COMPARISON:  December of 2021. FINDINGS: Mediastinal blood pool activity: SUV max 3.1 Liver activity: SUV max 4.48 NECK: RIGHT level IB lymph node has enlarged since the previous study. This now measures approximately 3.8 x 5.0 cm with a maximum SUV of 23.1 as compared to 14.2 on the prior study (image 37/4). Lymph nodes in the LEFT neck have resolved. Previous lymph node at the LEFT level II level that showed a maximum SUV of 10.3 posterior to the sternocleidomastoid on the prior study is no longer visible. No additional signs of nodal disease in the neck. Incidental CT findings: none CHEST: No hypermetabolic mediastinal or hilar nodes. Scattered nodular foci show improvement since previous imaging. There is a stable nodule in the LEFT mid chest (image 45/8) 7 mm without significant FDG uptake maximum SUV less than 1. Other scattered small nodules in the chest many of which have resolved. In an area that previously showed branching tree-in-bud changes there is a focal nodule that measures 8 x 5 mm with a maximum SUV of 1.5. Airways are patent. Incidental CT findings:  Calcified atheromatous plaque of the thoracic aorta. Normal heart size without pericardial effusion. Normal caliber of the central pulmonary vessels. RIGHT-sided Port-A-Cath terminates at the caval to atrial junction. Limited assessment of cardiovascular structures given lack of intravenous contrast. Esophagus mildly patulous. ABDOMEN/PELVIS: Resolution of small lymph node in the gastrohepatic recess otherwise negative appearance of the abdomen and pelvis, no signs of FDG avid disease. Incidental CT findings: Post cholecystectomy. Unremarkable appearance of liver, spleen, pancreas, adrenal glands and kidneys. Urinary bladder is collapsed. No hydronephrosis. Stable haziness of the jejunal mesentery, remains nonspecific with calcifications in the ileal mesentery. Calcified atheromatous plaque of the abdominal aorta. No aneurysmal dilation. Prostate with enlargement as before. Small fat containing umbilical hernia and LEFT inguinal hernia. SKELETON: No focal hypermetabolic activity to suggest skeletal metastasis. Incidental CT findings: Spinal degenerative changes. IMPRESSION: Deauville 5 changes with marked enlargement of submandibular lymph node on the RIGHT with markedly increased metabolic activity over previous imaging. Resolution of LEFT neck lymph nodes and lymph node in the upper abdomen. These areas are no longer visible. No additional new or progressive findings. Scattered pulmonary nodules favored to represent post infectious or inflammatory changes. Attention on follow-up. Stable haziness of jejunal mesentery. Attention on follow-up. Not associated with adenopathy or significant hypermetabolism. Electronically Signed   By: Zetta Bills M.D.   On: 08/23/2020 15:17   DG FLUORO GUIDE LUMBAR PUNCTURE  Result Date: 09/15/2020 CLINICAL DATA:  Headache.  History of follicular lymphoma. EXAM: DIAGNOSTIC LUMBAR PUNCTURE UNDER FLUOROSCOPIC GUIDANCE COMPARISON:  CT head 09/11/2020 FLUOROSCOPY TIME:  Fluoroscopy  Time:  42 seconds Radiation Exposure Index (if provided by the fluoroscopic device): 13.2 mGy Number of Acquired Spot Images: 0 PROCEDURE: Informed consent was obtained from the patient prior to the procedure, including potential complications of headache, allergy, and pain. With the patient prone, the lower back was prepped with Betadine. 1% Lidocaine was used for local anesthesia. Lumbar puncture was performed at the L3-4 level using a 22 gauge needle with return of 8 CSF with an opening pressure of 15 cm water. 16 ml of CSF were obtained for laboratory studies. The patient tolerated the procedure well and there were no apparent complications. IMPRESSION: 1. Technically successful lumbar puncture. The opening pressure was 15 cm of water. 16 cc of clear CSF were removed and sent to laboratory for analysis. Electronically Signed   By: Kerby Moors M.D.   On: 09/15/2020 12:57    ASSESSMENT & PLAN Brian Castillo 80 y.o. 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 The patient's last visit was on 02/12/2020 at which time he establish care.  After review the labs, the records, discussion with the patient the findings are most consistent with a stage III follicular lymphoma based on the PET CT scan.  There are lymph nodes on both sides of diaphragm and therefore stage III would be the most appropriate designation.    Previously we discussed the nature of follicular lymphoma and the treatment options moving forward.  The options at this time would be observation, immunotherapy, or combination of chemotherapy and immunotherapy.  Given the lack of symptoms, cytopenias, and bulky disease at the time of presentation we decided to proceed with observation alone. Unfortunately due to rapid progression we need to consider treatment moving forward.  Additionally we discussed rituximab monotherapy with weekly dosing x4 doses followed by every 2 months dosing x4  as a treatment option as well as bendamustine and rituximab on days 1 and 2 of a 28-day cycle x6.  We discussed the side effects, risks and benefits, and possible outcomes of treatment.  The patient voiced his understanding of these options and wished to proceed with R-Benda.   Over the period of one month the patient was found to have a rapidly enlarging right submandibular lymph node.  Due to concern for this enlarging lymph node we ordered a CT scan of the chest abdomen pelvis to assess for progression elsewhere, but that was the only lymph node that was progressing. Biopsy with ENT showed no progression into a DLBCL or other aggressive variant.  On PET CT scan from 08/23/2020 the patient showed complete response of disease with every site with the exception of his right cervical lymph node which is increasing in size and brightness.  As such we have to discontinue first-line therapy with rituximab and Bendamustine and transition over to second line R mini CHOP.  R mini CHOP chemotherapy Cycle 1 Day1 started on 08/28/2020.  #Follicular lymphoma, Stage III --new baseline PET CT scan unfortunately showed progression of the right cervical lymph node.  We are currently planning for 2nd line therapy with R-miniCHOP(which would also be effective if patient is having transformation of the tumor).  -- patient completed PET, Port, and TTE prior to start of R-miniCHOP. --prior PET CT findings are consistent with a Stage III follicular lymphoma. Due to rapidly enlarging submandibular lymph node we proceeded with treatment.  --imaging shows stable disease save for a single enlarging submandibular lymph node.  Prior biopsy has not  shown transformation into a more aggressive lymphoma.  --after discussion with the patient, he was agreeable to starting Bendamustine/Rituximab as first line treatment.  --started Cycle 1 Day 1 of R-Benda chemotherapy on 06/12/2020 --completed Cycle 3 Day of R-Benda. Unfortunately he had  progression for which we changed his therapy to R-miniCHOP.  --08/28/2020 is Day 1 of Cycle 1 of R-miniCHOP. Prior to start of Cycle 2 Day 1 he was noted to have increased size of the lymph node.  --RTC to start Cycle 3 Day 1 of R-miniCHOP in 3 weeks, though if tumor continues to grow will need to consider repeat excisional biopsy vs radiation to the tumor.   #Pain Control, improved --patient is having severe pain in his midline neck up to his scalp --extensive workup including CT/MRI scans of head/neck and LP have revealed no clear etiology for his findings --pain is responding well to gabapentin 300 BID with flexeril and oxycodone.  --oxycodone to 10mg  PO q 4H PRN with ibuprofen 600mg  Q6H PRN. --continue flexeril 5mg  q8H PRN.  --Continue to monitor  #Medication Interaction --Allopurinol increases the levels of Dilantin. --We will work with the patient's primary care provider Dr. Shon Baton to monitor his Dilantin levels and adjust accordingly. For now we are following this weekly.  #Fever, resolved --Single episode of fever up to 101 F occurred on 06/25/2020. --Patient was not neutropenic at that time and was evaluated emergency department with infectious work-up including chest x-ray, blood cultures, and urine culture.  Data at that time showed no evidence of infectious disease. --No further fevers noted since that time.  We will continue to monitor.  #Supportive Care --chemotherapy education completed --zofran 8mg  q8H PRN and compazine 10mg  PO q6H for nausea --acyclovir 400mg  PO BID for VCZ prophylaxis -- allopurinol 300mg  PO daily for TLS prophylaxis -- port in place -- no pain medication required at this time.    No orders of the defined types were placed in this encounter.  All questions were answered. The patient knows to call the clinic with any problems, questions or concerns.  A total of more than 30 minutes were spent on this encounter and over half of that time was spent  on counseling and coordination of care as outlined above.   Ledell Peoples, MD Department of Hematology/Oncology [place] at [facility] Phone: [phone] Pager: [phone] Email: [email or web address]  09/20/2020 3:47 PM

## 2020-09-23 ENCOUNTER — Ambulatory Visit: Payer: Medicare Other | Admitting: Physical Therapy

## 2020-09-23 ENCOUNTER — Ambulatory Visit: Payer: Medicare Other

## 2020-09-23 ENCOUNTER — Telehealth: Payer: Self-pay | Admitting: Hematology and Oncology

## 2020-09-23 ENCOUNTER — Other Ambulatory Visit: Payer: Self-pay

## 2020-09-23 ENCOUNTER — Encounter: Payer: Self-pay | Admitting: Physical Therapy

## 2020-09-23 DIAGNOSIS — M62838 Other muscle spasm: Secondary | ICD-10-CM

## 2020-09-23 DIAGNOSIS — M542 Cervicalgia: Secondary | ICD-10-CM | POA: Diagnosis not present

## 2020-09-23 LAB — CYTOLOGY - NON PAP

## 2020-09-23 LAB — PHENYTOIN LEVEL, FREE AND TOTAL
Phenytoin, Free: 0.8 ug/mL — ABNORMAL LOW (ref 1.0–2.0)
Phenytoin, Total: <0.8 ug/mL — ABNORMAL LOW (ref 10.0–20.0)

## 2020-09-23 NOTE — Therapy (Signed)
[facility] [address], [place], [place], [address] Phone: [phone]   Fax:  [phone]  Physical Therapy Treatment  Patient Details  Name: Brian Castillo MRN: [id number] Date of Birth: 12/30/1940 Referring Provider (PT): Ledell Peoples IV   Encounter Date: 09/23/2020   PT End of Session - 09/23/20 1356     Visit Number 2    Date for PT Re-Evaluation 11/13/20    Authorization Type UHC MCR    PT Start Time 7902    PT Stop Time 1438    PT Time Calculation (min) 42 min    Activity Tolerance Patient tolerated treatment well    Behavior During Therapy [facility] for tasks assessed/performed             Past Medical History:  Diagnosis Date   Allergy    mild   Arthritis    Cancer ([place])    Cataract    forming   Colon polyps    GERD (gastroesophageal reflux disease)    15 -20 years ago -none currently   Glaucoma    Hyperlipidemia    Hypertension    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   Subjective Assessment - 09/23/20 1358     Subjective A lot of pain across the back and along the right side. 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 Yes    Pain Score 5     Pain Location Neck    Pain Orientation Right     Pain Descriptors / Indicators Sore                               [facility] Adult PT Treatment/Exercise - 09/23/20 0001       Neck Exercises: Supine   Neck Retraction 5 reps    Neck Retraction Limitations Supine today    Other Supine Exercise Shoulder press 5x 3 sec hold in supine      Manual Therapy   Manual Therapy Soft tissue mobilization    Soft tissue mobilization Rt cervical > Lt: TP release Upper traps and Rt SCM distally. OA release and gentle traction intermittently                    PT Education - 09/23/20 1437     Education Details Educated pt to have better posture through his trunk ( cue was lift his heart) rather than gripping through his upper traps and neck musculature.    Person(s) Educated Patient    Methods Explanation;Demonstration;Verbal cues    Comprehension Verbalized understanding;Returned demonstration  PT Long Term Goals - 09/18/20 0941       PT LONG TERM GOAL #1   Title Ind with progression of advanced HEP    Time 8    Period Weeks    Status New    Target Date 11/13/20      PT LONG TERM GOAL #2   Title Patient reporting decreased neck pain with ADLs by >=75%    Time 8    Period Weeks    Status New      PT LONG TERM GOAL #3   Title Patient to demo functional neck extension in order to apply his eye drops with 75% less pain.    Time 8    Period Weeks    Status New      PT LONG TERM GOAL #4   Title Patient able to sleep without waking from neck pain.    Time 8    Period Weeks    Status New      PT LONG TERM GOAL #5   Title Patient to demo improved bil neck rotation to >=45 deg to ease ADLs.    Time 8    Period Weeks    Status New      Additional Long Term Goals   Additional Long Term Goals Yes      PT LONG TERM GOAL #6   Title Improved FS score to >= 60                   Plan - 09/23/20 1438     Clinical Impression Statement pt arrives with reports of "bad pain"  this AM, took medication and it is now down to about a 5/10 level. Pt is compliant with initial HEP. Pt demonstrates posture correction through over contracting his upper traps and extending through his thoracic spine. Pt was educated how to posture correct through his trunk instead and trying to soften his upper traps. Pt did not appear to have much muscle spasm but did have a few active trigger points along the upper traps and distal SCM on the RT. Pain was reduced to 2-3/10 at end of session.    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, h/o seizures (none since 1998)    Examination-Activity Limitations Sleep;Hygiene/Grooming    Stability/Clinical Decision Making Stable/Uncomplicated    Rehab Potential Excellent    PT Frequency 2x / week    PT Duration 8 weeks    PT Treatment/Interventions ADLs/Self Care Home Management;Cryotherapy;Electrical Stimulation;Moist Heat;Traction;Therapeutic exercise;Therapeutic activities;Neuromuscular re-education;Manual techniques;Patient/family education;Dry needling;Taping    PT Next Visit Plan Please assess for dry needling, no time for mechanical traction today, maybe try if you have time, review isometrics    PT [place] and Agree with Plan of Care Patient             Patient will benefit from skilled therapeutic intervention in order to improve the following deficits and impairments:  Decreased range of motion, Increased muscle spasms, Pain, Hypomobility, Impaired flexibility, Postural dysfunction, Decreased strength  Visit Diagnosis: Other muscle spasm     Problem List Patient Active Problem List   Diagnosis Date Noted   Neck pain 09/13/2020   Internal carotid artery stenosis, left 09/13/2020   Anemia 42/59/5638   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 [place], [place], [address] Phone: [phone]   Fax:  [phone]  Name: Brian Castillo MRN: [id number] Date of Birth: November 04, 1940

## 2020-09-23 NOTE — Telephone Encounter (Signed)
Scheduled per los. Called and left msg. Mailed printout  °

## 2020-09-25 ENCOUNTER — Telehealth: Payer: Self-pay | Admitting: *Deleted

## 2020-09-25 NOTE — Telephone Encounter (Signed)
Received call from patient.  He is inquiring about his lab results and the final labs on his Lumbar Puncture.  Advised that the LP did not show any  malignant (cancer)cells. He voiced understanding. He also had concerns about his labs from 09/20/20-specifically his Dilantin  level. The total was very low @ 0.8  He discussed this with Dr. Virgina Jock. They are both puzzled with this result. Brian Castillo is taking his Dilantin regularly-2 in the am and 2 in the pm.  Advised that we will re-check his labs tomorrow am and then let him know how they are. His HGb is dipping low as well. Last week was 8.2 He states his neck pain seems to be improving with the PT and gabapentin, though he is asking to increase the night time dose . Discussed with Dr. Lorenso Courier and he is agreeable to increasing the night dose to 600 mg.  Pt takes 300mg  in the am and midday.  Pt understands that we will contact him with labs results from tomorrow. He voiced understanding and is grateful that we can check his labs again this week.

## 2020-09-26 ENCOUNTER — Inpatient Hospital Stay: Payer: Medicare Other

## 2020-09-26 ENCOUNTER — Other Ambulatory Visit: Payer: Self-pay

## 2020-09-26 ENCOUNTER — Ambulatory Visit: Payer: Medicare Other | Admitting: Physical Therapy

## 2020-09-26 DIAGNOSIS — C8251 Diffuse follicle center lymphoma, lymph nodes of head, face, and neck: Secondary | ICD-10-CM

## 2020-09-26 DIAGNOSIS — M542 Cervicalgia: Secondary | ICD-10-CM

## 2020-09-26 DIAGNOSIS — Z95828 Presence of other vascular implants and grafts: Secondary | ICD-10-CM

## 2020-09-26 DIAGNOSIS — M62838 Other muscle spasm: Secondary | ICD-10-CM

## 2020-09-26 DIAGNOSIS — C8511 Unspecified B-cell lymphoma, lymph nodes of head, face, and neck: Secondary | ICD-10-CM | POA: Diagnosis not present

## 2020-09-26 LAB — CMP (CANCER CENTER ONLY)
ALT: 10 U/L (ref 0–44)
AST: 10 U/L — ABNORMAL LOW (ref 15–41)
Albumin: 3.1 g/dL — ABNORMAL LOW (ref 3.5–5.0)
Alkaline Phosphatase: 93 U/L (ref 38–126)
Anion gap: 8 (ref 5–15)
BUN: 20 mg/dL (ref 8–23)
CO2: 28 mmol/L (ref 22–32)
Calcium: 9 mg/dL (ref 8.9–10.3)
Chloride: 104 mmol/L (ref 98–111)
Creatinine: 0.76 mg/dL (ref 0.61–1.24)
GFR, Estimated: >60 mL/min (ref >60)
Glucose, Bld: 111 mg/dL — ABNORMAL HIGH (ref 70–99)
Potassium: 4.2 mmol/L (ref 3.5–5.1)
Sodium: 140 mmol/L (ref 135–145)
Total Bilirubin: 0.3 mg/dL (ref 0.3–1.2)
Total Protein: 6.5 g/dL (ref 6.5–8.1)

## 2020-09-26 LAB — CBC WITH DIFFERENTIAL (CANCER CENTER ONLY)
Abs Immature Granulocytes: 0.03 10*3/uL (ref 0.00–0.07)
Basophils Absolute: 0 10*3/uL (ref 0.0–0.1)
Basophils Relative: 1 %
Eosinophils Absolute: 0.2 10*3/uL (ref 0.0–0.5)
Eosinophils Relative: 3 %
HCT: 24.9 % — ABNORMAL LOW (ref 39.0–52.0)
Hemoglobin: 8.9 g/dL — ABNORMAL LOW (ref 13.0–17.0)
Immature Granulocytes: 1 %
Lymphocytes Relative: 3 %
Lymphs Abs: 0.2 10*3/uL — ABNORMAL LOW (ref 0.7–4.0)
MCH: 34.1 pg — ABNORMAL HIGH (ref 26.0–34.0)
MCHC: 35.7 g/dL (ref 30.0–36.0)
MCV: 95.4 fL (ref 80.0–100.0)
Monocytes Absolute: 0.1 10*3/uL (ref 0.1–1.0)
Monocytes Relative: 1 %
Neutro Abs: 5.8 10*3/uL (ref 1.7–7.7)
Neutrophils Relative %: 91 %
Platelet Count: 224 10*3/uL (ref 150–400)
RBC: 2.61 MIL/uL — ABNORMAL LOW (ref 4.22–5.81)
RDW: 13.6 % (ref 11.5–15.5)
WBC Count: 6.4 10*3/uL (ref 4.0–10.5)
nRBC: 0 % (ref 0.0–0.2)

## 2020-09-26 LAB — URIC ACID: Uric Acid, Serum: 4.3 mg/dL (ref 3.7–8.6)

## 2020-09-26 LAB — LACTATE DEHYDROGENASE: LDH: 159 U/L (ref 98–192)

## 2020-09-26 MED ORDER — HEPARIN SOD (PORK) LOCK FLUSH 100 UNIT/ML IV SOLN
500.0000 [IU] | Freq: Once | INTRAVENOUS | Status: AC
  Administered 2020-09-26: 500 [IU]
  Filled 2020-09-26: qty 5

## 2020-09-26 MED ORDER — SODIUM CHLORIDE 0.9% FLUSH
10.0000 mL | Freq: Once | INTRAVENOUS | Status: AC
  Administered 2020-09-26: 10 mL
  Filled 2020-09-26: qty 10

## 2020-09-26 NOTE — Therapy (Signed)
[facility] [address], [place], [place], [address] Phone: [phone]   Fax:  [phone]  Physical Therapy Treatment  Patient Details  Name: Brian Castillo MRN: [id number] Date of Birth: [birth year] Referring Provider (PT): Ledell Peoples IV   Encounter Date: 09/26/2020   PT End of Session - 09/26/20 1441     Visit Number 3    Date for PT Re-Evaluation 11/13/20    Authorization Type UHC MCR    PT Start Time 9767    PT Stop Time 1436    PT Time Calculation (min) 38 min    Activity Tolerance Patient tolerated treatment well             Past Medical History:  Diagnosis Date   Allergy    mild   Arthritis    Cancer ([place])    Cataract    forming   Colon polyps    GERD (gastroesophageal reflux disease)    15 -20 years ago -none currently   Glaucoma    Hyperlipidemia    Hypertension    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   Subjective Assessment - 09/26/20 1357     Subjective Less pain overall right > left upper trap and base of head (suboccipital region).    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    Currently in Pain? Yes    Pain Score 3     Pain Location Neck     Pain Orientation Right;Left    Pain Type Acute pain                               [facility] Adult PT Treatment/Exercise - 09/26/20 0001       Neck Exercises: Seated   Neck Retraction 5 reps    Other Seated Exercise scapular retraction x 5      Manual Therapy   Soft tissue mobilization upper traps, cervical paraspinals, suboccipitals      Neck Exercises: Stretches   Upper Trapezius Stretch Limitations right/left holding to chair bottom, other hand applying gentle overpressure 3x 30 sec    Neck Stretch Limitations suboccipital stretch with chin tuck followed by UE overpressure on back of the head              Trigger Point Dry Needling - 09/26/20 0001     Consent Given? Yes    Education Handout Provided Yes    Muscles Treated Head and Neck Upper trapezius;Suboccipitals;Cervical multifidi    Other Dry Needling bil    Upper Trapezius Response Palpable increased  muscle length    Suboccipitals Response Palpable increased muscle length    Cervical multifidi Response Palpable increased muscle length                  PT Education - 09/26/20 1436     Education Details upper trap stretch, suboccipital stretch; dry needling after care    Person(s) Educated Patient    Methods Explanation;Demonstration;Handout    Comprehension Verbalized understanding;Returned demonstration                 PT Long Term Goals - 09/18/20 0941       PT LONG TERM GOAL #1   Title Ind with progression of advanced HEP    Time 8    Period Weeks    Status New    Target Date 11/13/20      PT LONG TERM GOAL #2   Title Patient reporting decreased neck pain with ADLs by >=75%    Time 8    Period Weeks    Status New      PT LONG TERM GOAL #3   Title Patient to demo functional neck extension in order to apply his eye drops with 75% less pain.    Time 8    Period Weeks    Status New      PT LONG TERM GOAL #4   Title Patient able to sleep without waking from neck  pain.    Time 8    Period Weeks    Status New      PT LONG TERM GOAL #5   Title Patient to demo improved bil neck rotation to >=45 deg to ease ADLs.    Time 8    Period Weeks    Status New      Additional Long Term Goals   Additional Long Term Goals Yes      PT LONG TERM GOAL #6   Title Improved FS score to >= 60                   Plan - 09/26/20 1423     Clinical Impression Statement The patient reports decreased pain intensity since start of care but would like to try dry needling for further relief from muscular pain in upper trapezius and suboccipital muscles.  He is able to lie prone with ease during DN and manual therapy.  Added low load, long duration stretching of these muscles to his HEP.  He demonstrates improved soft tissue length following treatment session.  Therapist closely monitoring throughout treatment session and provided cues for ex to achieve desired stretch response.    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, h/o seizures (none since 1998)    Rehab Potential Excellent    PT Frequency 2x / week    PT Treatment/Interventions ADLs/Self Care Home Management;Cryotherapy;Electrical Stimulation;Moist Heat;Traction;Therapeutic exercise;Therapeutic activities;Neuromuscular re-education;Manual techniques;Patient/family education;Dry needling;Taping    PT Next Visit Plan assess response to DN#1; manual therapy as needed;  review upper trap and suboccipital stretch; hold on mechanical traction for now but if deemed necessary recommend consulting with oncologist first    [place]             Patient will benefit from skilled therapeutic intervention in order to improve the following deficits and impairments:  Decreased range of motion, Increased muscle spasms, Pain, Hypomobility, Impaired flexibility, Postural dysfunction, Decreased strength  Visit Diagnosis: Other muscle spasm  Cervicalgia     Problem  List Patient Active Problem List   Diagnosis Date Noted   Neck pain 09/13/2020   Internal carotid artery stenosis, left 09/13/2020   Anemia 88/71/9597   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 [place], [place], [address] Phone: [phone]   Fax:  [phone]  Name: Brian Castillo MRN: [id number] Date of Birth: 1940-12-30

## 2020-09-26 NOTE — Patient Instructions (Addendum)
   Access Code: [email or web address] URL: [email or web address] Date: 09/26/2020 Prepared by: Ruben Im  Exercises Seated Cervical Retraction - 3 x daily - 7 x weekly - 1 sets - 10 reps - 3-5 sec hold Seated Scapular Retraction - 1 x daily - 7 x weekly - 1-3 sets - 10 reps - 2-3 sec hold Seated Cervical Traction - 1 sets - 3 reps - 20 sec hold Standing Isometric Cervical Extension with Manual Resistance - 1 x daily - 7 x weekly - 1 sets - 5 reps - 10 sec hold Standing Isometric Cervical Sidebending with Manual Resistance - 1 x daily - 7 x weekly - 1 sets - 5 reps - 10 sec hold hold Seated Upper Trapezius Stretch - 3 x daily - 7 x weekly - 1 sets - 3 reps - 30 hold Seated Cervical Sidebending Stretch - 3 x daily - 7 x weekly - 1 sets - 3 reps - 20-30 hold Seated Cervical Flexion Stretch with Finger Support Behind Neck - 3 x daily - 7 x weekly - 1 sets - 3 reps - 20-30 hold  Trigger Point Dry Needling  What is Trigger Point Dry Needling (DN)? DN is a physical therapy technique used to treat muscle pain and dysfunction. Specifically, DN helps deactivate muscle trigger points (muscle knots).  A thin filiform needle is used to penetrate the skin and stimulate the underlying trigger point. The goal is for a local twitch response (LTR) to occur and for the trigger point to relax. No medication of any kind is injected during the procedure.   What Does Trigger Point Dry Needling Feel Like?  The procedure feels different for each individual patient. Some patients report that they do not actually feel the needle enter the skin and overall the process is not painful. Very mild bleeding may occur. However, many patients feel a deep cramping in the muscle in which the needle was inserted. This is the local twitch response.   How Will I feel after the treatment? Soreness is normal, and the onset of soreness may not occur for a few hours. Typically this soreness does not last longer than two days.   Bruising is uncommon, however; ice can be used to decrease any possible bruising.  In rare cases feeling tired or nauseous after the treatment is normal. In addition, your symptoms may get worse before they get better, this period will typically not last longer than 24 hours.   What Can I do After My Treatment? Increase your hydration by drinking more water for the next 24 hours. You may place ice or heat on the areas treated that have become sore, however, do not use heat on inflamed or bruised areas. Heat often brings more relief post needling. You can continue your regular activities, but vigorous activity is not recommended initially after the treatment for 24 hours. DN is best combined with other physical therapy such as strengthening, stretching, and other therapies.

## 2020-09-27 LAB — PHENYTOIN LEVEL, FREE AND TOTAL
Phenytoin, Free: 0.7 ug/mL — ABNORMAL LOW (ref 1.0–2.0)
Phenytoin, Total: 7.3 ug/mL — ABNORMAL LOW (ref 10.0–20.0)

## 2020-09-30 ENCOUNTER — Ambulatory Visit: Payer: Medicare Other | Admitting: Physical Therapy

## 2020-09-30 ENCOUNTER — Other Ambulatory Visit: Payer: Self-pay

## 2020-09-30 ENCOUNTER — Encounter: Payer: Self-pay | Admitting: Physical Therapy

## 2020-09-30 DIAGNOSIS — M62838 Other muscle spasm: Secondary | ICD-10-CM

## 2020-09-30 DIAGNOSIS — M542 Cervicalgia: Secondary | ICD-10-CM

## 2020-09-30 NOTE — Therapy (Addendum)
[facility] [address], [place], [place], [address] Phone: [phone]   Fax:  [phone]  Physical Therapy Treatment/Discharge Summary   Patient Details  Name: Brian Castillo MRN: [id number] Date of Birth: 1940/12/16 Referring Provider (PT): Ledell Peoples IV   Encounter Date: 09/30/2020   PT End of Session - 09/30/20 1142     Visit Number 4    Date for PT Re-Evaluation 11/13/20    Authorization Type UHC MCR    PT Start Time 5701    PT Stop Time 1212    PT Time Calculation (min) 30 min    Activity Tolerance Patient tolerated treatment well    Behavior During Therapy [facility] for tasks assessed/performed             Past Medical History:  Diagnosis Date   Allergy    mild   Arthritis    Cancer ([place])    Cataract    forming   Colon polyps    GERD (gastroesophageal reflux disease)    15 -20 years ago -none currently   Glaucoma    Hyperlipidemia    Hypertension    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   Subjective Assessment - 09/30/20 1213     Subjective I did fine with the needling. I would like today to be my last day. I think getting my medication right is what I need to do. I plan on continuing my HEP.    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 Yes    Pain Score 3     Pain Location Neck    Pain Orientation Right;Left    Pain Descriptors / Indicators Sore    Aggravating Factors  Waking up in the AM    Pain Relieving Factors medication    Multiple Pain Sites No                [facility] PT Assessment - 09/30/20 0001       Observation/Other Assessments   Focus on Therapeutic Outcomes (FOTO)  FS= 59      AROM   Cervical Flexion full    Cervical Extension 40    Cervical - Right Side Bend 20    Cervical - Left Side Bend 30    Cervical - Right Rotation 45    Cervical - Left Rotation 60      Strength   Overall Strength Comments grossly 5/5 except cerv retraction 4-/5; extension painful                                        PT  Long Term Goals - 09/30/20 1201       PT LONG TERM GOAL #1   Title Ind with progression of advanced HEP    Time 8    Period Weeks    Status Achieved      PT LONG TERM GOAL #2   Title Patient reporting decreased neck pain with ADLs by >=75%    Time 8    Period Weeks    Status --   25%     PT LONG TERM GOAL #3   Title Patient to demo functional neck extension in order to apply his eye drops with 75% less pain.    Time 8    Period Weeks    Status Achieved      PT LONG TERM GOAL #4   Title Patient able to sleep without waking from neck pain.    Time 8    Period Weeks    Status Partially Met   50%-60%                  Plan - 09/30/20 1214     Clinical Impression Statement Pt arrives today with mild neck pain. He reports feeling the PT has helped but would prefer at this time to focus on "getting his medication right" and continuing with the exercises we have taught him. Goal are partially met, see chart for specifics. FOTO score improved as is his active rotation. Resting tone remarkably improved since dry needling session. Pt choosing to discahrge formal PT today.    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 He would like to focus on getting the medication dosage where he and the doctor feel it should be.    PT Home Exercise Plan [place] and Agree with Plan of Care Patient             Patient will benefit from skilled therapeutic intervention in order to improve the following deficits and impairments:  Decreased range of motion, Increased muscle spasms, Pain, Hypomobility, Impaired flexibility, Postural dysfunction, Decreased strength  Visit Diagnosis: Other muscle spasm  Cervicalgia   PHYSICAL THERAPY DISCHARGE SUMMARY  Visits from Start of Care: 4  Current functional level related to goals / functional outcomes: See clinical impressions above.  Pt requested discharge from PT   Remaining deficits: As above   Education / Equipment: Basic HEP   Patient agrees to discharge. Patient goals were partially met. Patient is being discharged due to the patient's request.   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

## 2020-10-01 ENCOUNTER — Telehealth: Payer: Self-pay

## 2020-10-01 MED ORDER — GABAPENTIN 300 MG PO CAPS: 300.0000 mg | ORAL_CAPSULE | Freq: Three times a day (TID) | ORAL | 2 refills | Status: DC

## 2020-10-01 NOTE — Telephone Encounter (Signed)
Dr. Lorenso Courier, pt wants to know if he is supposed to come in for weekly labs? If so, he will need to schedule these.  Additionally, pt requests refill of Gabapentin as he is taking 3 caps QD. Refill has been sent to his confirmed pharmacy and pt is aware.

## 2020-10-03 ENCOUNTER — Encounter: Payer: Medicare Other | Admitting: Physical Therapy

## 2020-10-04 ENCOUNTER — Inpatient Hospital Stay: Payer: Medicare Other

## 2020-10-04 ENCOUNTER — Other Ambulatory Visit: Payer: Self-pay

## 2020-10-04 DIAGNOSIS — C8251 Diffuse follicle center lymphoma, lymph nodes of head, face, and neck: Secondary | ICD-10-CM

## 2020-10-04 DIAGNOSIS — C8511 Unspecified B-cell lymphoma, lymph nodes of head, face, and neck: Secondary | ICD-10-CM | POA: Diagnosis not present

## 2020-10-04 LAB — CMP (CANCER CENTER ONLY)
ALT: 12 U/L (ref 0–44)
AST: 16 U/L (ref 15–41)
Albumin: 3.8 g/dL (ref 3.5–5.0)
Alkaline Phosphatase: 97 U/L (ref 38–126)
Anion gap: 9 (ref 5–15)
BUN: 14 mg/dL (ref 8–23)
CO2: 28 mmol/L (ref 22–32)
Calcium: 9.5 mg/dL (ref 8.9–10.3)
Chloride: 105 mmol/L (ref 98–111)
Creatinine: 0.71 mg/dL (ref 0.61–1.24)
GFR, Estimated: >60 mL/min (ref >60)
Glucose, Bld: 100 mg/dL — ABNORMAL HIGH (ref 70–99)
Potassium: 4.7 mmol/L (ref 3.5–5.1)
Sodium: 142 mmol/L (ref 135–145)
Total Bilirubin: 0.3 mg/dL (ref 0.3–1.2)
Total Protein: 6.9 g/dL (ref 6.5–8.1)

## 2020-10-04 LAB — CBC WITH DIFFERENTIAL (CANCER CENTER ONLY)
Abs Immature Granulocytes: 0.01 10*3/uL (ref 0.00–0.07)
Basophils Absolute: 0 10*3/uL (ref 0.0–0.1)
Basophils Relative: 1 %
Eosinophils Absolute: 0.2 10*3/uL (ref 0.0–0.5)
Eosinophils Relative: 10 %
HCT: 28.8 % — ABNORMAL LOW (ref 39.0–52.0)
Hemoglobin: 10 g/dL — ABNORMAL LOW (ref 13.0–17.0)
Immature Granulocytes: 0 %
Lymphocytes Relative: 9 %
Lymphs Abs: 0.2 10*3/uL — ABNORMAL LOW (ref 0.7–4.0)
MCH: 34.2 pg — ABNORMAL HIGH (ref 26.0–34.0)
MCHC: 34.7 g/dL (ref 30.0–36.0)
MCV: 98.6 fL (ref 80.0–100.0)
Monocytes Absolute: 0.4 10*3/uL (ref 0.1–1.0)
Monocytes Relative: 17 %
Neutro Abs: 1.5 10*3/uL — ABNORMAL LOW (ref 1.7–7.7)
Neutrophils Relative %: 63 %
Platelet Count: 174 10*3/uL (ref 150–400)
RBC: 2.92 MIL/uL — ABNORMAL LOW (ref 4.22–5.81)
RDW: 15.5 % (ref 11.5–15.5)
WBC Count: 2.4 10*3/uL — ABNORMAL LOW (ref 4.0–10.5)
nRBC: 0 % (ref 0.0–0.2)

## 2020-10-08 ENCOUNTER — Encounter: Payer: Medicare Other | Admitting: Physical Therapy

## 2020-10-10 ENCOUNTER — Encounter: Payer: Medicare Other | Admitting: Physical Therapy

## 2020-10-11 ENCOUNTER — Inpatient Hospital Stay: Payer: Medicare Other

## 2020-10-11 ENCOUNTER — Inpatient Hospital Stay: Payer: Medicare Other | Admitting: Hematology and Oncology

## 2020-10-11 ENCOUNTER — Other Ambulatory Visit: Payer: Self-pay | Admitting: *Deleted

## 2020-10-11 ENCOUNTER — Other Ambulatory Visit: Payer: Self-pay

## 2020-10-11 VITALS — BP 113/65 | HR 66 | Temp 98.2°F | Resp 18 | Wt 176.9 lb

## 2020-10-11 DIAGNOSIS — C8511 Unspecified B-cell lymphoma, lymph nodes of head, face, and neck: Secondary | ICD-10-CM | POA: Diagnosis not present

## 2020-10-11 DIAGNOSIS — Z95828 Presence of other vascular implants and grafts: Secondary | ICD-10-CM

## 2020-10-11 DIAGNOSIS — C8251 Diffuse follicle center lymphoma, lymph nodes of head, face, and neck: Secondary | ICD-10-CM

## 2020-10-11 LAB — CMP (CANCER CENTER ONLY)
ALT: 11 U/L (ref 0–44)
AST: 13 U/L — ABNORMAL LOW (ref 15–41)
Albumin: 3.5 g/dL (ref 3.5–5.0)
Alkaline Phosphatase: 123 U/L (ref 38–126)
Anion gap: 8 (ref 5–15)
BUN: 13 mg/dL (ref 8–23)
CO2: 27 mmol/L (ref 22–32)
Calcium: 9.1 mg/dL (ref 8.9–10.3)
Chloride: 105 mmol/L (ref 98–111)
Creatinine: 0.82 mg/dL (ref 0.61–1.24)
GFR, Estimated: >60 mL/min (ref >60)
Glucose, Bld: 133 mg/dL — ABNORMAL HIGH (ref 70–99)
Potassium: 4.3 mmol/L (ref 3.5–5.1)
Sodium: 140 mmol/L (ref 135–145)
Total Bilirubin: 0.3 mg/dL (ref 0.3–1.2)
Total Protein: 6.6 g/dL (ref 6.5–8.1)

## 2020-10-11 LAB — CBC (CANCER CENTER ONLY)
HCT: 29.9 % — ABNORMAL LOW (ref 39.0–52.0)
Hemoglobin: 10.4 g/dL — ABNORMAL LOW (ref 13.0–17.0)
MCH: 34.6 pg — ABNORMAL HIGH (ref 26.0–34.0)
MCHC: 34.8 g/dL (ref 30.0–36.0)
MCV: 99.3 fL (ref 80.0–100.0)
Platelet Count: 144 10*3/uL — ABNORMAL LOW (ref 150–400)
RBC: 3.01 MIL/uL — ABNORMAL LOW (ref 4.22–5.81)
RDW: 15.3 % (ref 11.5–15.5)
WBC Count: 2.5 10*3/uL — ABNORMAL LOW (ref 4.0–10.5)
nRBC: 0 % (ref 0.0–0.2)

## 2020-10-11 LAB — LACTATE DEHYDROGENASE: LDH: 270 U/L — ABNORMAL HIGH (ref 98–192)

## 2020-10-11 MED ORDER — SODIUM CHLORIDE 0.9% FLUSH
10.0000 mL | Freq: Once | INTRAVENOUS | Status: AC
  Administered 2020-10-11: 10 mL via INTRAVENOUS
  Filled 2020-10-11: qty 10

## 2020-10-11 MED ORDER — SODIUM CHLORIDE 0.9% FLUSH
10.0000 mL | Freq: Once | INTRAVENOUS | Status: AC
  Administered 2020-10-11: 10 mL
  Filled 2020-10-11: qty 10

## 2020-10-11 MED ORDER — HEPARIN SOD (PORK) LOCK FLUSH 100 UNIT/ML IV SOLN
500.0000 [IU] | Freq: Once | INTRAVENOUS | Status: AC
  Administered 2020-10-11: 500 [IU] via INTRAVENOUS
  Filled 2020-10-11: qty 5

## 2020-10-14 ENCOUNTER — Encounter: Payer: Medicare Other | Admitting: Physical Therapy

## 2020-10-14 LAB — PHENYTOIN LEVEL, FREE AND TOTAL
Phenytoin, Free: 1.9 ug/mL (ref 1.0–2.0)
Phenytoin, Total: 15.9 ug/mL (ref 10.0–20.0)

## 2020-10-16 ENCOUNTER — Telehealth: Payer: Self-pay | Admitting: *Deleted

## 2020-10-16 NOTE — Telephone Encounter (Signed)
Received call from patient inquiring about his upcoming appt with Dr. Redmond Baseman. Pt was told Dr. Redmond Baseman seems to be planning to do a biopsy of the lymph node in his neck during his upcoming appt. Pt states the enlarged lymph node is hard as a rock and wonders how they will do a biopsy. Advised that Dr. Lorenso Courier will reach out to Dr. Redmond Baseman to clarify his plans.  Pt also states the burning sensation up his neck and across the back of his head.  It seems to be much worse at night.  Pt is getting very frustrated with it. The Gabapentin helps a bit.  Spoke with Dr. Lorenso Courier about this and he is agreeable to a consultation with Dr. Mickeal Skinner. Message sent for a new pt appt with Dr. Mickeal Skinner.  Message sent to Dr. Mickeal Skinner as well and his nurse, Shelle Iron.  Pt made aware of this referral

## 2020-10-17 ENCOUNTER — Encounter: Payer: Medicare Other | Admitting: Physical Therapy

## 2020-10-21 ENCOUNTER — Encounter: Payer: Medicare Other | Admitting: Physical Therapy

## 2020-10-21 ENCOUNTER — Telehealth: Payer: Self-pay | Admitting: *Deleted

## 2020-10-21 NOTE — Telephone Encounter (Signed)
Call made to pt after he spoke with Dr. Lorenso Courier to see if he had any questions.  He did ask if he needed to stay on the Allopurinol and  the Acyclovir since he is not on Treatment right now.  Advised that I would check with Dr. Lorenso Courier about that.Pt states the lymph has gotten quite large and purplish in color. The skin is intact, no drainage. He states the pain gets worse at night and he wakes up with his lips on that right side, numb and tingling.  Advised that it sounds positional. Advised to try cold packs on it and see it that helps even a little bit. Also advised that he can take 2 oxycodone at night with the 600 mg gabapentin but to be careful when he gets up at night as his balance might be off some. Brian Castillo voiced understanding.  Advised that we will schedule him to see Dr. Lorenso Courier the week of 11/04/20-this will be after he sees Dr. Redmond Baseman at ENT clinic. He is agreeable to this. Encouraged him to call at any time with questions or concerns. Brian Castillo said he would.

## 2020-10-21 NOTE — Telephone Encounter (Signed)
Received vm message from patient asking for Dr. Lorenso Courier to call him this morning-ASAP. Pt states the mass on his neck is even larger and turning dark purple. He states his face is swelling as well. It is becoming mor painful and he is having more difficulty sleeping at night due the shooting , sharp pains.  He says he feels quite discouraged about the situation.  Please call pt ASAP

## 2020-10-22 ENCOUNTER — Telehealth: Payer: Self-pay | Admitting: Hematology and Oncology

## 2020-10-22 ENCOUNTER — Telehealth: Payer: Self-pay | Admitting: *Deleted

## 2020-10-22 NOTE — Telephone Encounter (Signed)
Received call back from [facility] ENT. They will be able to see Brian Castillo today @ 3pm.  Call made to pt and made him aware of appt.  He is glad he can get in today. Advised that I would check in with him tomorrow morning. He voiced understanding.

## 2020-10-22 NOTE — Telephone Encounter (Signed)
Call made to patient regarding his question about continuing allopurinol and acyclovir.  Spoke with pt and advised that Dr. Lorenso Courier recommends continuing the allopurinol but it is ok to stop the Acyclovir for now.  Pt voiced understanding.

## 2020-10-22 NOTE — Telephone Encounter (Signed)
Scheduled appts per 8/8 sch msg. Pt aware.  

## 2020-10-22 NOTE — Telephone Encounter (Signed)
Received call from patient. He states the pain in his right neck is worsening, getting darker in color, he cannot sleep because of the pain despite Gabapentin 600 mg and Oxycodone 10 mg. He can only sleep for about 2 hours or so and he has to be in a recliner. Laying bed makes the pain worse.  He states his lips/mouth on the right are getting numb as well. Advised that I would call [facility] ENT to have him seen today, instead of next week.  Call made to [facility], Dr. Redmond Baseman' office. Spoke with his Environmental consultant. Described the urgency of the situation and asked for pt to be seen today. She states that Dr. Redmond Baseman in on vacation but that the on call physician, Dr. Constance Holster should be able to see him.  She asked for most recent office visit and a narrative as to what the patient major complaint is-to be fax's to their office.  This was fax'd to [phone].  Dr. Redmond Baseman assistant states she will call back with appt date and time. She thinks she should be able to get the pt in today.

## 2020-10-23 ENCOUNTER — Telehealth: Payer: Self-pay | Admitting: *Deleted

## 2020-10-23 NOTE — Telephone Encounter (Signed)
[name] patient regarding his appt with Dr. Constance Holster yesterday (ENT). He states he was glad to have had that appt. Dr. Constance Holster tried to aspirate fluid from the large lymoh node on his right neck. Pt states he was unable to get any fluid, stating the lymph node is very hard/large  Apparently, Dr. Constance Holster would rather have radiation treatment for this node rather than remove it.  Report from Dr. Constance Holster has arrived here and given to Dr. Lorenso Courier.  Pt states the pain remains sever at night and has to sleep in a recliner, not laying down. Message given to Dr. Lorenso Courier to regarding need to increase pain meds and rad/onc referral

## 2020-10-24 ENCOUNTER — Other Ambulatory Visit: Payer: Self-pay | Admitting: *Deleted

## 2020-10-24 ENCOUNTER — Other Ambulatory Visit: Payer: Self-pay | Admitting: Hematology and Oncology

## 2020-10-24 ENCOUNTER — Encounter: Payer: Medicare Other | Admitting: Physical Therapy

## 2020-10-24 DIAGNOSIS — C8251 Diffuse follicle center lymphoma, lymph nodes of head, face, and neck: Secondary | ICD-10-CM

## 2020-10-24 MED ORDER — GABAPENTIN 300 MG PO CAPS: 300.0000 mg | ORAL_CAPSULE | Freq: Three times a day (TID) | ORAL | 2 refills | Status: DC

## 2020-10-24 MED ORDER — OXYCODONE HCL 5 MG PO TABS: 10.0000 mg | ORAL_TABLET | ORAL | 0 refills | Status: DC | PRN

## 2020-10-24 NOTE — Telephone Encounter (Signed)
Received call from patient. He is asking if he still needs to see Dr. Redmond Baseman next week.  Advised that Dr. Lorenso Courier recommends that he see him.  Pt states that the areas where Dr. Constance Holster attempted FNA  and where he numbed it (right submandibular lymph node)is now draining consistently.  The fluid is slightly blood tinged and also ranges from clear to brownish in color. He has to keep gauze taped on there all the time. He has a call in to Dr. Constance Holster about this.  Pt is aware of his appt with Rad/Onc next week.  Refilled Gabapentin

## 2020-10-25 ENCOUNTER — Inpatient Hospital Stay: Payer: Medicare Other | Admitting: Hematology and Oncology

## 2020-10-25 ENCOUNTER — Other Ambulatory Visit: Payer: Self-pay | Admitting: *Deleted

## 2020-10-25 ENCOUNTER — Other Ambulatory Visit: Payer: Self-pay

## 2020-10-25 ENCOUNTER — Inpatient Hospital Stay: Payer: Medicare Other | Attending: Hematology and Oncology

## 2020-10-25 ENCOUNTER — Telehealth: Payer: Self-pay | Admitting: *Deleted

## 2020-10-25 VITALS — BP 138/69 | HR 71 | Temp 98.0°F | Resp 17 | Ht 68.0 in | Wt 179.1 lb

## 2020-10-25 DIAGNOSIS — M5481 Occipital neuralgia: Secondary | ICD-10-CM | POA: Insufficient documentation

## 2020-10-25 DIAGNOSIS — Z79899 Other long term (current) drug therapy: Secondary | ICD-10-CM | POA: Diagnosis not present

## 2020-10-25 DIAGNOSIS — C8251 Diffuse follicle center lymphoma, lymph nodes of head, face, and neck: Secondary | ICD-10-CM | POA: Diagnosis not present

## 2020-10-25 DIAGNOSIS — I10 Essential (primary) hypertension: Secondary | ICD-10-CM | POA: Diagnosis not present

## 2020-10-25 DIAGNOSIS — C8511 Unspecified B-cell lymphoma, lymph nodes of head, face, and neck: Secondary | ICD-10-CM | POA: Insufficient documentation

## 2020-10-25 DIAGNOSIS — C8331 Diffuse large B-cell lymphoma, lymph nodes of head, face, and neck: Secondary | ICD-10-CM

## 2020-10-25 DIAGNOSIS — Z87891 Personal history of nicotine dependence: Secondary | ICD-10-CM | POA: Insufficient documentation

## 2020-10-25 DIAGNOSIS — E785 Hyperlipidemia, unspecified: Secondary | ICD-10-CM | POA: Insufficient documentation

## 2020-10-25 DIAGNOSIS — Z95828 Presence of other vascular implants and grafts: Secondary | ICD-10-CM

## 2020-10-25 DIAGNOSIS — Z86010 Personal history of colonic polyps: Secondary | ICD-10-CM | POA: Diagnosis not present

## 2020-10-25 DIAGNOSIS — K219 Gastro-esophageal reflux disease without esophagitis: Secondary | ICD-10-CM | POA: Diagnosis not present

## 2020-10-25 DIAGNOSIS — G40909 Epilepsy, unspecified, not intractable, without status epilepticus: Secondary | ICD-10-CM | POA: Insufficient documentation

## 2020-10-25 DIAGNOSIS — M1990 Unspecified osteoarthritis, unspecified site: Secondary | ICD-10-CM | POA: Insufficient documentation

## 2020-10-25 LAB — CMP (CANCER CENTER ONLY)
ALT: 12 U/L (ref 0–44)
AST: 15 U/L (ref 15–41)
Albumin: 3.7 g/dL (ref 3.5–5.0)
Alkaline Phosphatase: 131 U/L — ABNORMAL HIGH (ref 38–126)
Anion gap: 9 (ref 5–15)
BUN: 19 mg/dL (ref 8–23)
CO2: 27 mmol/L (ref 22–32)
Calcium: 9.2 mg/dL (ref 8.9–10.3)
Chloride: 103 mmol/L (ref 98–111)
Creatinine: 0.81 mg/dL (ref 0.61–1.24)
GFR, Estimated: >60 mL/min (ref >60)
Glucose, Bld: 107 mg/dL — ABNORMAL HIGH (ref 70–99)
Potassium: 4.1 mmol/L (ref 3.5–5.1)
Sodium: 139 mmol/L (ref 135–145)
Total Bilirubin: 0.3 mg/dL (ref 0.3–1.2)
Total Protein: 6.9 g/dL (ref 6.5–8.1)

## 2020-10-25 LAB — CBC WITH DIFFERENTIAL (CANCER CENTER ONLY)
Abs Immature Granulocytes: 0.01 10*3/uL (ref 0.00–0.07)
Basophils Absolute: 0.1 10*3/uL (ref 0.0–0.1)
Basophils Relative: 1 %
Eosinophils Absolute: 0.5 10*3/uL (ref 0.0–0.5)
Eosinophils Relative: 10 %
HCT: 31.3 % — ABNORMAL LOW (ref 39.0–52.0)
Hemoglobin: 10.9 g/dL — ABNORMAL LOW (ref 13.0–17.0)
Immature Granulocytes: 0 %
Lymphocytes Relative: 9 %
Lymphs Abs: 0.5 10*3/uL — ABNORMAL LOW (ref 0.7–4.0)
MCH: 34.1 pg — ABNORMAL HIGH (ref 26.0–34.0)
MCHC: 34.8 g/dL (ref 30.0–36.0)
MCV: 97.8 fL (ref 80.0–100.0)
Monocytes Absolute: 1.1 10*3/uL — ABNORMAL HIGH (ref 0.1–1.0)
Monocytes Relative: 20 %
Neutro Abs: 3.1 10*3/uL (ref 1.7–7.7)
Neutrophils Relative %: 60 %
Platelet Count: 154 10*3/uL (ref 150–400)
RBC: 3.2 MIL/uL — ABNORMAL LOW (ref 4.22–5.81)
RDW: 13.7 % (ref 11.5–15.5)
WBC Count: 5.2 10*3/uL (ref 4.0–10.5)
nRBC: 0 % (ref 0.0–0.2)

## 2020-10-25 MED ORDER — HYDROMORPHONE HCL 2 MG PO TABS: 2.0000 mg | ORAL_TABLET | ORAL | 0 refills | Status: DC | PRN

## 2020-10-25 MED ORDER — SODIUM CHLORIDE 0.9% FLUSH
10.0000 mL | Freq: Once | INTRAVENOUS | Status: AC
  Administered 2020-10-25: 10 mL
  Filled 2020-10-25: qty 10

## 2020-10-25 MED ORDER — HEPARIN SOD (PORK) LOCK FLUSH 100 UNIT/ML IV SOLN
500.0000 [IU] | Freq: Once | INTRAVENOUS | Status: DC
  Filled 2020-10-25: qty 5

## 2020-10-25 MED ORDER — PREDNISONE 20 MG PO TABS: 60.0000 mg | ORAL_TABLET | Freq: Every day | ORAL | 0 refills | Status: DC

## 2020-10-25 NOTE — Telephone Encounter (Signed)
Received call from patient  stating that his pain is excruciating @ night, he can only sleep  maybe 2 hours each night. It is now affecting his ear and his mouth is becoming more numb and he has started dribbling the fluids that he drinks because his right side of his mouth is so numb. He is taking in between 30-40 oz of fluid daily. The increase in the oxycodone of 15 mg did not help last night. He takes this with ibuprofen and gabapentin without relief.  Advised that Dr. Lorenso Courier has opening in his schedule today and we could see him @ 11:40 am with labs prior.  Dr. Lorenso Courier made aware.

## 2020-10-27 ENCOUNTER — Encounter: Payer: Self-pay | Admitting: Hematology and Oncology

## 2020-10-27 NOTE — Progress Notes (Signed)
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 Lorenso Courier  4) 02/22/2020: PET CT scan showed Deauville 4 and Deauville 5 adenopathy bilaterally in the neck. There is also a Deauville 4 left supraclavicular lymph node and a Deauville 4 right gastric lymph node 5) 04/24/2020: presents with rapid enlargement of his right submandibular lymph node.  6) 05/20/2020: partial excision of right submandibular lymph node with attempted fluid drainage. Pathology consistent with follicular lymphoma, no clear evidence of transformation.  7) 06/12/2020: Cycle 1 Day 1 of R-Benda. 8) 07/10/2020: Cycle 2 Day 1 of R-Benda. 9) 08/07/2020: Cycle 3 Day 1 of R-Benda. Noted to have enlargement of right cervical lymph node on exam.  10) 08/28/2020: Cycle 1 Day 1 of R-miniCHOP 11) 09/20/2020: Cycle 2 Day 2 of R-miniCHOP 12) 10/11/2020: Cycle 3 of R-miniCHOP HELD due to progression.  Interval History:  Brian Castillo 80 y.o. 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 The patient's last visit was on 10/11/2020. In the interim since the last visit his right cervical lymph node has grown rapidly.  On exam today Brian Castillo is accompanied by his wife.  Unfortunately there has been massive increase in the size of the submandibular lymph node since we last spoke.  The patient has been seen by ENT but they note that given its rapidly increasing size and massive  notes they would not be able to perform an excision of it.  The patient is having worsening pain and is very uncomfortable.  He is barely able to sleep at night and looks quite tired.  He is not having any B symptoms at this point.  He reports that he is not having any issues with fevers, chills, sweats, nausea, vomiting or diarrhea.  A full 10 point ROS is listed below.  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   Seizures ([place])    last seizure was in 1988    SURGICAL HISTORY: Past Surgical History:  Procedure Laterality Date   CARPOMETACARPEL SUSPENSION PLASTY Right 08/24/2014   Procedure: RIGHT CMC ARTHOPLASTY WITH DOUBLE TENDON TRANSFER AND REPAIR  RECONSTRUCTION ;  Surgeon: Roseanne Kaufman, MD;  Location: [facility];  Service: Orthopedics;  Laterality: Right;   CHOLECYSTECTOMY     COLONOSCOPY     IR IMAGING GUIDED PORT INSERTION  08/14/2020   IR RADIOLOGIST EVAL & MGMT  08/20/2020   KNEE ARTHROSCOPY  2003   bilateral   POLYPECTOMY      SOCIAL HISTORY: Social History   Socioeconomic History   Marital status: Married    Spouse name: Not on file  Number of children: Not on file   Years of education: Not on file   Highest education level: Not on file  Occupational History   Not on file  Tobacco Use   Smoking status: Former   Smokeless tobacco: Never   Tobacco comments:    quit 1984  Substance and Sexual Activity   Alcohol use: Yes    Alcohol/week: 14.0 standard drinks    Types: 14 Glasses of wine per week   Drug use: No   Sexual activity: Not on file  Other Topics Concern   Not on file  Social History Narrative   Not on file   Social Determinants of Health   Financial  Resource Strain: Not on file  Food Insecurity: Not on file  Transportation Needs: Not on file  Physical Activity: Not on file  Stress: Not on file  Social Connections: Not on file  Intimate Partner Violence: Not on file    FAMILY HISTORY: Family History  Problem Relation Age of Onset   Colon cancer Neg Hx    Colon polyps Neg Hx     ALLERGIES:  is allergic to brimonidine tartrate-timolol, lisinopril, and netarsudil dimesylate.  MEDICATIONS:  Current Outpatient Medications  Medication Sig Dispense Refill   HYDROmorphone (DILAUDID) 2 MG tablet Take 1 tablet (2 mg total) by mouth every 4 (four) hours as needed for severe pain. 15 tablet 0   predniSONE (DELTASONE) 20 MG tablet Take 3 tablets (60 mg total) by mouth daily with breakfast. 15 tablet 0   acyclovir (ZOVIRAX) 400 MG tablet Take 1 tablet (400 mg total) by mouth 2 (two) times daily. (Patient not taking: Reported on 10/25/2020) 60 tablet 5   allopurinol (ZYLOPRIM) 300 MG tablet Take 300 mg by mouth daily.     amLODipine (NORVASC) 5 MG tablet Take 5 mg by mouth Daily.      aspirin 81 MG tablet Take 81 mg by mouth daily.     Calcium Carb-Cholecalciferol (CALCIUM 600 + D PO) Take 1 tablet by mouth daily.     cholecalciferol (VITAMIN D) 1000 units tablet Take 1,000 Units by mouth daily.     cyclobenzaprine (FLEXERIL) 5 MG tablet Take 1 tablet (5 mg total) by mouth 3 (three) times daily as needed for muscle spasms. 30 tablet 0   gabapentin (NEURONTIN) 300 MG capsule Take 1 capsule (300 mg total) by mouth 3 (three) times daily. Take 1 capsule (300 mg total) by mouth twice a day and 2 capsules ('600mg'$ ) at bedtime 120 capsule 2   ibuprofen (ADVIL) 200 MG tablet Take 400 mg by mouth every 6 (six) hours as needed for mild pain.     Latanoprostene Bunod (VYZULTA) 0.024 % SOLN Place 1 drop into both eyes at bedtime.     lidocaine-prilocaine (EMLA) cream Apply 1 application topically as needed. (Patient taking differently: Apply 1 application  topically daily as needed (port access).) 30 g 0   metoprolol succinate (TOPROL-XL) 25 MG 24 hr tablet Take 25 mg by mouth Daily.      oxyCODONE (OXY IR/ROXICODONE) 5 MG immediate release tablet Take 2-3 tablets (10-15 mg total) by mouth every 4 (four) hours as needed for severe pain. 120 tablet 0   pantoprazole (PROTONIX) 40 MG tablet Take 1 tablet (40 mg total) by mouth daily before breakfast. 30 tablet 0   phenytoin (DILANTIN) 100 MG ER capsule Take 200 mg by mouth 2 (two) times daily.     rosuvastatin (CRESTOR) 20 MG tablet Take 20 mg by  mouth daily.     senna-docusate (SENOKOT-S) 8.6-50 MG tablet Take 2 tablets by mouth at bedtime as needed for moderate constipation. 60 tablet 0   timolol (BETIMOL) 0.5 % ophthalmic solution Place 1 drop into both eyes 2 (two) times daily.     No current facility-administered medications for this visit.    REVIEW OF SYSTEMS:   Constitutional: ( - ) fevers, ( - )  chills , ( - ) night sweats Eyes: ( - ) blurriness of vision, ( - ) double vision, ( - ) watery eyes Ears, nose, mouth, throat, and face: ( - ) mucositis, ( - ) sore throat Respiratory: ( - ) cough, ( - ) dyspnea, ( - ) wheezes Cardiovascular: ( - ) palpitation, ( - ) chest discomfort, ( - ) lower extremity swelling Gastrointestinal:  ( - ) nausea, ( - ) heartburn, ( - ) change in bowel habits Skin: ( - ) abnormal skin rashes Lymphatics: ( - ) new lymphadenopathy, ( - ) easy bruising Neurological: ( - ) numbness, ( - ) tingling, ( - ) new weaknesses Behavioral/Psych: ( - ) mood change, ( - ) new changes  All other systems were reviewed with the patient and are negative.  PHYSICAL EXAMINATION: ECOG PERFORMANCE STATUS: 1 - Symptomatic but completely ambulatory  Vitals:   10/25/20 1147  BP: 138/69  Pulse: 71  Resp: 17  Temp: 98 F (36.7 C)  SpO2: 98%    Filed Weights   10/25/20 1147  Weight: 179 lb 1.6 oz (81.2 kg)     GENERAL: well appearing elderly Caucasian male in NAD  SKIN:  skin color, texture, turgor are normal, no rashes or significant lesions EYES: conjunctiva are pink and non-injected, sclera clear NECK: supple, non-tender LYMPH:  Single palpable right sided submandibular lymph node. Otherwise no other marked palpable lymphadenopathy in the cervical, axillary or supraclavicular lymph nodes.  LUNGS: clear to auscultation and percussion with normal breathing effort HEART: regular rate & rhythm and no murmurs and no lower extremity edema Musculoskeletal: no cyanosis of digits and no clubbing  PSYCH: alert & oriented x 3, fluent speech NEURO: no focal motor/sensory deficits  LABORATORY DATA:  I have reviewed the data as listed CBC Latest Ref Rng & Units 10/25/2020 10/11/2020 10/04/2020  WBC 4.0 - 10.5 K/uL 5.2 2.5(L) 2.4(L)  Hemoglobin 13.0 - 17.0 g/dL 10.9(L) 10.4(L) 10.0(L)  Hematocrit 39.0 - 52.0 % 31.3(L) 29.9(L) 28.8(L)  Platelets 150 - 400 K/uL 154 144(L) 174    CMP Latest Ref Rng & Units 10/25/2020 10/11/2020 10/04/2020  Glucose 70 - 99 mg/dL 107(H) 133(H) 100(H)  BUN 8 - 23 mg/dL '19 13 14  '$ Creatinine 0.61 - 1.24 mg/dL 0.81 0.82 0.71  Sodium 135 - 145 mmol/L 139 140 142  Potassium 3.5 - 5.1 mmol/L 4.1 4.3 4.7  Chloride 98 - 111 mmol/L 103 105 105  CO2 22 - 32 mmol/L '27 27 28  '$ Calcium 8.9 - 10.3 mg/dL 9.2 9.1 9.5  Total Protein 6.5 - 8.1 g/dL 6.9 6.6 6.9  Total Bilirubin 0.3 - 1.2 mg/dL 0.3 0.3 0.3  Alkaline Phos 38 - 126 U/L 131(H) 123 97  AST 15 - 41 U/L 15 13(L) 16  ALT 0 - 44 U/L '12 11 12   '$ RADIOGRAPHIC STUDIES: I have personally reviewed the radiological images as listed and agreed with the findings in the report: stable lymphadenopathy with exception of the rapidly enlarging submandibular lymph node.   No results found.  ASSESSMENT & PLAN  Brian Castillo 80 y.o. 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 The patient's last visit was on 02/12/2020 at which time he establish care.   After review the labs, the records, discussion with the patient the findings are most consistent with a stage III follicular lymphoma based on the PET CT scan.  There are lymph nodes on both sides of diaphragm and therefore stage III would be the most appropriate designation.    Previously we discussed the nature of follicular lymphoma and the treatment options moving forward.  The options at this time would be observation, immunotherapy, or combination of chemotherapy and immunotherapy.  Given the lack of symptoms, cytopenias, and bulky disease at the time of presentation we decided to proceed with observation alone. Unfortunately due to rapid progression we need to consider treatment moving forward.  Additionally we discussed rituximab monotherapy with weekly dosing x4 doses followed by every 2 months dosing x4 as a treatment option as well as bendamustine and rituximab on days 1 and 2 of a 28-day cycle x6.  We discussed the side effects, risks and benefits, and possible outcomes of treatment.  The patient voiced his understanding of these options and wished to proceed with R-Benda.   Over the period of one month the patient was found to have a rapidly enlarging right submandibular lymph node.  Due to concern for this enlarging lymph node we ordered a CT scan of the chest abdomen pelvis to assess for progression elsewhere, but that was the only lymph node that was progressing. 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   On PET CT scan from 08/23/2020 the patient showed complete response of disease with every site with the exception of his right cervical lymph node which is increasing in size and brightness.  As such we have to discontinue first-line therapy with rituximab and Bendamustine and transition over to second line R mini CHOP.  R mini CHOP chemotherapy Cycle 1 Day1 started on 08/28/2020.  Unfortunately when he came time to proceed to cycle 3 it was evident  that the lymph node in the neck had started to grow again rapidly.  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  We are currently planning for 2nd line therapy with R-miniCHOP(which would also be effective if patient is having transformation of the tumor).  -- patient completed PET, Port, and TTE prior to start of R-miniCHOP. --prior PET CT findings are consistent with a Stage III follicular lymphoma. Due to rapidly enlarging submandibular lymph node we proceeded with treatment.  --imaging shows stable disease save for a single enlarging submandibular lymph node.  Prior biopsy has shown transformation into a more aggressive lymphoma.  --started Cycle 1 Day 1 of R-Benda chemotherapy on 06/12/2020 --completed Cycle 3 Day of R-Benda. Unfortunately he had progression for which we changed his therapy to R-miniCHOP.  --08/28/2020 was Day 1 of Cycle 1 of R-miniCHOP. Prior to start of Cycle 3 Day 1 he was noted to have increased size of the lymph node.  Plan: -- ENT notes lesion is too large for excisional biopsy.  --Given that ENT is unable to excise this lesion will refer to radiation oncology for palliative radiation to the lesion --The next chemotherapy line would be gemcitabine and oxaliplatin.  My preference would be to have intervention radiation  oncology before starting this regimen. --patient to see Rad/Onc next Tuesday (10/29/2020)   #Pain Control, improved --patient is having severe pain in his midline neck up to his scalp --extensive workup including CT/MRI scans of head/neck and LP have revealed no clear etiology for his findings --pain is responding well to gabapentin 300 BID with flexeril and oxycodone.  --oxycodone to 15 mg PO q 4H PRN with ibuprofen '600mg'$  Q6H PRN. --Dilaudid 2 mg p.o. for breakthrough pain --continue flexeril '5mg'$  q8H PRN.  --Continue to  monitor  #Medication Interaction --Allopurinol increases the levels of Dilantin. --We will work with the patient's primary care provider Dr. Shon Baton to monitor his Dilantin levels and adjust accordingly. For now we are following this weekly.  #Fever, resolved --Single episode of fever up to 101 F occurred on 06/25/2020. --Patient was not neutropenic at that time and was evaluated emergency department with infectious work-up including chest x-ray, blood cultures, and urine culture.  Data at that time showed no evidence of infectious disease. --No further fevers noted since that time.  We will continue to monitor.  #Supportive Care --chemotherapy education completed --zofran '8mg'$  q8H PRN and compazine '10mg'$  PO q6H for nausea --acyclovir '400mg'$  PO BID for VCZ prophylaxis -- allopurinol '300mg'$  PO daily for TLS prophylaxis -- port in place -- no pain medication required at this time.    No orders of the defined types were placed in this encounter.  All questions were answered. The patient knows to call the clinic with any problems, questions or concerns.  A total of more than 30 minutes were spent on this encounter and over half of that time was spent on counseling and coordination of care as outlined above.   Ledell Peoples, MD Department of Hematology/Oncology [place] [name] Phone: [phone] Pager: [phone] Email: [email or web address]  10/27/2020 7:58 PM

## 2020-10-27 NOTE — Progress Notes (Signed)
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 Lorenso Courier  4) 02/22/2020: PET CT scan showed Deauville 4 and Deauville 5 adenopathy bilaterally in Brian neck. There is also a Deauville 4 left supraclavicular lymph node and a Deauville 4 right gastric lymph node 5) 04/24/2020: presents with rapid enlargement of his right submandibular lymph node.  6) 05/20/2020: partial excision of right submandibular lymph node with attempted fluid drainage. Pathology consistent with follicular lymphoma, no clear evidence of transformation.  7) 06/12/2020: Cycle 1 Day 1 of R-Benda. 8) 07/10/2020: Cycle 2 Day 1 of R-Benda. 9) 08/07/2020: Cycle 3 Day 1 of R-Benda. Noted to have enlargement of right cervical lymph node on exam.  10) 08/28/2020: Cycle 1 Day 1 of R-miniCHOP 11) 09/20/2020: Cycle 2 Day 2 of R-miniCHOP 12) 10/11/2020: Cycle 3 of R-miniCHOP HELD due to progression.  Interval History:  Brian Castillo 80 y.o. 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 Brian Castillo's last visit was on 09/20/2020. In Brian interim since Brian last visit his right cervical lymph node has grown considerably.  On exam today Brian Castillo is accompanied by his wife.  He reports that Brian lymph node is most painful at night and for some reason "can tell when it is nighttime".  He notes that when he lays down there is a throbbing pain that keeps him awake for several  hours.  It does ease off later in Brian night and he is able to get some rest.  He is also able to nap throughout Brian day.  Brian unusual pains that he was having in Brian back of his head and scalp are currently under reasonable control with her current pain regimen.  He reports that he is not having any issues with fevers, chills, sweats, nausea, vomiting or diarrhea.  A full 10 point ROS is listed below.  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   Seizures ([place])    last seizure was in 1988    SURGICAL HISTORY: Past Surgical History:  Procedure Laterality Date   CARPOMETACARPEL SUSPENSION PLASTY Right 08/24/2014   Procedure: RIGHT CMC ARTHOPLASTY WITH DOUBLE TENDON TRANSFER AND REPAIR  RECONSTRUCTION ;  Surgeon: Roseanne Kaufman, MD;  Location: [facility];  Service: Orthopedics;  Laterality: Right;   CHOLECYSTECTOMY     COLONOSCOPY     IR IMAGING GUIDED PORT INSERTION  08/14/2020   IR RADIOLOGIST EVAL & MGMT  08/20/2020   KNEE ARTHROSCOPY  2003   bilateral   POLYPECTOMY      SOCIAL HISTORY: Social History   Socioeconomic History   Marital  status: Married    Spouse name: Not on file   Number of children: Not on file   Years of education: Not on file   Highest education level: Not on file  Occupational History   Not on file  Tobacco Use   Smoking status: Former   Smokeless tobacco: Never   Tobacco comments:    quit 1984  Substance and Sexual Activity   Alcohol use: Yes    Alcohol/week: 14.0 standard drinks    Types: 14 Glasses of wine per week   Drug use: No   Sexual activity: Not on file  Other Topics Concern   Not on file  Social History Narrative   Not on file   Social Determinants  of Health   Financial Resource Strain: Not on file  Food Insecurity: Not on file  Transportation Needs: Not on file  Physical Activity: Not on file  Stress: Not on file  Social Connections: Not on file  Intimate Partner Violence: Not on file    FAMILY HISTORY: Family History  Problem Relation Age of Onset   Colon cancer Neg Hx    Colon polyps Neg Hx     ALLERGIES:  is allergic to brimonidine tartrate-timolol, lisinopril, and netarsudil dimesylate.  MEDICATIONS:  Current Outpatient Medications  Medication Sig Dispense Refill   rosuvastatin (CRESTOR) 20 MG tablet Take 20 mg by mouth daily.     acyclovir (ZOVIRAX) 400 MG tablet Take 1 tablet (400 mg total) by mouth 2 (two) times daily. (Castillo not taking: Reported on 10/25/2020) 60 tablet 5   allopurinol (ZYLOPRIM) 300 MG tablet Take 300 mg by mouth daily.     amLODipine (NORVASC) 5 MG tablet Take 5 mg by mouth Daily.      aspirin 81 MG tablet Take 81 mg by mouth daily.     Calcium Carb-Cholecalciferol (CALCIUM 600 + D PO) Take 1 tablet by mouth daily.     cholecalciferol (VITAMIN D) 1000 units tablet Take 1,000 Units by mouth daily.     cyclobenzaprine (FLEXERIL) 5 MG tablet Take 1 tablet (5 mg total) by mouth 3 (three) times daily as needed for muscle spasms. 30 tablet 0   gabapentin (NEURONTIN) 300 MG capsule Take 1 capsule (300 mg total) by mouth 3 (three) times daily. Take 1 capsule (300 mg total) by mouth twice a day and 2 capsules ('600mg'$ ) at bedtime 120 capsule 2   HYDROmorphone (DILAUDID) 2 MG tablet Take 1 tablet (2 mg total) by mouth every 4 (four) hours as needed for severe pain. 15 tablet 0   ibuprofen (ADVIL) 200 MG tablet Take 400 mg by mouth every 6 (six) hours as needed for mild pain.     Latanoprostene Bunod (VYZULTA) 0.024 % SOLN Place 1 drop into both eyes at bedtime.     lidocaine-prilocaine (EMLA) cream Apply 1 application topically as needed. (Castillo taking differently: Apply 1 application topically daily as  needed (port access).) 30 g 0   metoprolol succinate (TOPROL-XL) 25 MG 24 hr tablet Take 25 mg by mouth Daily.      oxyCODONE (OXY IR/ROXICODONE) 5 MG immediate release tablet Take 2-3 tablets (10-15 mg total) by mouth every 4 (four) hours as needed for severe pain. 120 tablet 0   pantoprazole (PROTONIX) 40 MG tablet Take 1 tablet (40 mg total) by mouth daily before breakfast. 30 tablet 0   phenytoin (DILANTIN) 100 MG ER capsule Take 200 mg by mouth 2 (two) times daily.     predniSONE (DELTASONE) 20  MG tablet Take 3 tablets (60 mg total) by mouth daily with breakfast. 15 tablet 0   senna-docusate (SENOKOT-S) 8.6-50 MG tablet Take 2 tablets by mouth at bedtime as needed for moderate constipation. 60 tablet 0   timolol (BETIMOL) 0.5 % ophthalmic solution Place 1 drop into both eyes 2 (two) times daily.     No current facility-administered medications for this visit.    REVIEW OF SYSTEMS:   Constitutional: ( - ) fevers, ( - )  chills , ( - ) night sweats Eyes: ( - ) blurriness of vision, ( - ) double vision, ( - ) watery eyes Ears, nose, mouth, throat, and face: ( - ) mucositis, ( - ) sore throat Respiratory: ( - ) cough, ( - ) dyspnea, ( - ) wheezes Cardiovascular: ( - ) palpitation, ( - ) chest discomfort, ( - ) lower extremity swelling Gastrointestinal:  ( - ) nausea, ( - ) heartburn, ( - ) change in bowel habits Skin: ( - ) abnormal skin rashes Lymphatics: ( - ) new lymphadenopathy, ( - ) easy bruising Neurological: ( - ) numbness, ( - ) tingling, ( - ) new weaknesses Behavioral/Psych: ( - ) mood change, ( - ) new changes  All other systems were reviewed with Brian Castillo and are negative.  PHYSICAL EXAMINATION: ECOG PERFORMANCE STATUS: 1 - Symptomatic but completely ambulatory  Vitals:   10/11/20 1036  BP: 113/65  Pulse: 66  Resp: 18  Temp: 98.2 F (36.8 C)  SpO2: 98%    Filed Weights   10/11/20 1036  Weight: 176 lb 14.4 oz (80.2 kg)     GENERAL: well appearing elderly  Caucasian male in NAD  SKIN: skin color, texture, turgor are normal, no rashes or significant lesions EYES: conjunctiva are pink and non-injected, sclera clear NECK: supple, non-tender LYMPH:  Single palpable right sided submandibular lymph node. Otherwise no other marked palpable lymphadenopathy in Brian cervical, axillary or supraclavicular lymph nodes.  LUNGS: clear to auscultation and percussion with normal breathing effort HEART: regular rate & rhythm and no murmurs and no lower extremity edema Musculoskeletal: no cyanosis of digits and no clubbing  PSYCH: alert & oriented x 3, fluent speech NEURO: no focal motor/sensory deficits  LABORATORY DATA:  I have reviewed Brian data as listed CBC Latest Ref Rng & Units 10/25/2020 10/11/2020 10/04/2020  WBC 4.0 - 10.5 K/uL 5.2 2.5(L) 2.4(L)  Hemoglobin 13.0 - 17.0 g/dL 10.9(L) 10.4(L) 10.0(L)  Hematocrit 39.0 - 52.0 % 31.3(L) 29.9(L) 28.8(L)  Platelets 150 - 400 K/uL 154 144(L) 174    CMP Latest Ref Rng & Units 10/25/2020 10/11/2020 10/04/2020  Glucose 70 - 99 mg/dL 107(H) 133(H) 100(H)  BUN 8 - 23 mg/dL '19 13 14  '$ Creatinine 0.61 - 1.24 mg/dL 0.81 0.82 0.71  Sodium 135 - 145 mmol/L 139 140 142  Potassium 3.5 - 5.1 mmol/L 4.1 4.3 4.7  Chloride 98 - 111 mmol/L 103 105 105  CO2 22 - 32 mmol/L '27 27 28  '$ Calcium 8.9 - 10.3 mg/dL 9.2 9.1 9.5  Total Protein 6.5 - 8.1 g/dL 6.9 6.6 6.9  Total Bilirubin 0.3 - 1.2 mg/dL 0.3 0.3 0.3  Alkaline Phos 38 - 126 U/L 131(H) 123 97  AST 15 - 41 U/L 15 13(L) 16  ALT 0 - 44 U/L '12 11 12   '$ RADIOGRAPHIC STUDIES: I have personally reviewed Brian radiological images as listed and agreed with Brian findings in Brian report: stable lymphadenopathy with exception of Brian rapidly enlarging  submandibular lymph node.   No results found.  ASSESSMENT & PLAN Brian Castillo 80 y.o. 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 Brian Castillo's last visit was on 02/12/2020 at  which time he establish care.  After review Brian labs, Brian records, discussion with Brian Castillo Brian findings are most consistent with a stage III follicular lymphoma based on Brian PET CT scan.  There are lymph nodes on both sides of diaphragm and therefore stage III would be Brian most appropriate designation.    Previously we discussed Brian nature of follicular lymphoma and Brian treatment options moving forward.  Brian options at this time would be observation, immunotherapy, or combination of chemotherapy and immunotherapy.  Given Brian lack of symptoms, cytopenias, and bulky disease at Brian time of presentation we decided to proceed with observation alone. Unfortunately due to rapid progression we need to consider treatment moving forward.  Additionally we discussed rituximab monotherapy with weekly dosing x4 doses followed by every 2 months dosing x4 as a treatment option as well as bendamustine and rituximab on days 1 and 2 of a 28-day cycle x6.  We discussed Brian side effects, risks and benefits, and possible outcomes of treatment.  Brian Castillo voiced his understanding of these options and wished to proceed with R-Benda.   Over Brian period of one month Brian Castillo was found to have a rapidly enlarging right submandibular lymph node.  Due to concern for this enlarging lymph node we ordered a CT scan of Brian chest abdomen pelvis to assess for progression elsewhere, but that was Brian only lymph node that was progressing. 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   On PET CT scan from 08/23/2020 Brian Castillo showed complete response of disease with every site with Brian exception of his right cervical lymph node which is increasing in size and brightness.  As such we have to discontinue first-line therapy with rituximab and Bendamustine and transition over to second line R mini CHOP.  R mini CHOP chemotherapy Cycle 1 Day1 started on 08/28/2020.  Unfortunately when he came time to  proceed to cycle 3 it was evident that Brian lymph node in Brian neck had started to grow again rapidly.  #Follicular lymphoma, Stage III # Conversion of Single Lymph node to DLBCL --new baseline PET CT scan unfortunately showed progression of Brian right cervical lymph node.  We are currently planning for 2nd line therapy with R-miniCHOP(which would also be effective if Castillo is having transformation of Brian tumor).  -- Castillo completed PET, Port, and TTE prior to start of R-miniCHOP. --prior PET CT findings are consistent with a Stage III follicular lymphoma. Due to rapidly enlarging submandibular lymph node we proceeded with treatment.  --imaging shows stable disease save for a single enlarging submandibular lymph node.  Prior biopsy has shown transformation into a more aggressive lymphoma.  --started Cycle 1 Day 1 of R-Benda chemotherapy on 06/12/2020 --completed Cycle 3 Day of R-Benda. Unfortunately he had progression for which we changed his therapy to R-miniCHOP.  --08/28/2020 was Day 1 of Cycle 1 of R-miniCHOP. Prior to start of Cycle 3 Day 1 he was noted to have increased size of Brian lymph node.  Plan: -- Referral to ENT for consideration of excisional biopsy for both symptom relief and additional tissue in order to assure that we have Brian proper diagnosis for this tumor --If ENT is unable to excise this lesion  will need to consider radiation oncology for palliative radiation to Brian lesion --Brian next chemotherapy line would be gemcitabine and oxaliplatin.  My preference would be to have intervention by either ENT or radiation oncology before starting this regimen. --Await word from ENT regarding whether or not excisional biopsy is feasible  #Pain Control, improved --Castillo is having severe pain in his midline neck up to his scalp --extensive workup including CT/MRI scans of head/neck and LP have revealed no clear etiology for his findings --pain is responding well to gabapentin 300 BID with  flexeril and oxycodone.  --oxycodone to 10 mg PO q 4H PRN with ibuprofen '600mg'$  Q6H PRN. --continue flexeril '5mg'$  q8H PRN.  --Continue to monitor  #Medication Interaction --Allopurinol increases Brian levels of Dilantin. --We will work with Brian Castillo's primary care provider Dr. Shon Baton to monitor his Dilantin levels and adjust accordingly. For now we are following this weekly.  #Fever, resolved --Single episode of fever up to 101 F occurred on 06/25/2020. --Castillo was not neutropenic at that time and was evaluated emergency department with infectious work-up including chest x-ray, blood cultures, and urine culture.  Data at that time showed no evidence of infectious disease. --No further fevers noted since that time.  We will continue to monitor.  #Supportive Care --chemotherapy education completed --zofran '8mg'$  q8H PRN and compazine '10mg'$  PO q6H for nausea --acyclovir '400mg'$  PO BID for VCZ prophylaxis -- allopurinol '300mg'$  PO daily for TLS prophylaxis -- port in place -- no pain medication required at this time.    No orders of Brian defined types were placed in this encounter.  All questions were answered. Brian Castillo knows to call Brian clinic with any problems, questions or concerns.  A total of more than 30 minutes were spent on this encounter and over half of that time was spent on counseling and coordination of care as outlined above.   Ledell Peoples, MD Department of Hematology/Oncology [place] [facility] Phone: [phone] Pager: [phone] Email: [email or web address]  10/27/2020 7:44 PM

## 2020-10-28 ENCOUNTER — Telehealth: Payer: Self-pay | Admitting: *Deleted

## 2020-10-28 NOTE — Progress Notes (Signed)
Oncology Nurse Navigator Documentation   Placed introductory call to new referral patient Brian Castillo.  Introduced myself as the H&N oncology nurse navigator that works with Dr. Isidore Moos to whom he has been referred by Dr. Lorenso Courier.  He confirmed understanding of referral. Briefly explained my role as his navigator, provided my contact information.  Confirmed understanding of upcoming appts and [place] location, explained arrival and registration process. I encouraged him to call with questions/concerns as he moves forward with appts and procedures.   He verbalized understanding of information provided, expressed appreciation for my call.   Navigator Initial Assessment Employment Status: he is retired. Currently on FMLA / STD: no Living Situation: he lives with his wife.  Support System: wife,family PCP: Shon Baton MD PCD: na Financial Concerns: na Transportation Needs: no Sensory Deficits: na Language Barriers/Interpreter Needed:  no Ambulation Needs: no DME Used in Home: no Psychosocial Needs:  no Concerns/Needs Understanding Cancer:  addressed/answered by navigator to best of ability Self-Expressed Needs: no   Harlow Asa RN, BSN, OCN Head & Neck Oncology Nurse [place] [facility] [place] Phone # [phone]  Fax # [phone]

## 2020-10-28 NOTE — Telephone Encounter (Signed)
[name] patient this morning to check on him after starting steroids on Friday and stronger pain meds. Spoke to [place], his wife as [name] was at an appt with his PCP, Dr. Virgina Jock. Asked Arbie Cookey about how he did over the weekend after starting steroids and dilaudid. She states he actually slept 2 nights out of 3-a big improvement. She sates that the swollen area on his right neck started draining again and now needs gauze to help absorb the drainage not just a bandaid.  She states it is yellowish brown in color.  Denies fever or chills.  He is scheduled to see Rad/Onc tomorrow as well as Dr. Redmond Baseman.  Contacted Larita Fife, RN to make sure he has time between appts to see Dr. Redmond Baseman @ 10:50 am. She advised that he should have enough time.  Made Arbie Cookey aware of this.

## 2020-10-28 NOTE — Progress Notes (Signed)
Lymphoma Location(s) / Histology:  Follicular lymphoma, Stage III  Brian Castillo presented with symptoms of: diagnosed with follicular lymphoma several months ago, has had multiple rounds of chemotherapy and immunotherapy treatment. The neck mass seems to be responsive to treatment but then after treatment it seems to return each time. Over the past month or so it has gotten much larger and is now causing some discomfort and pain.  CT Neck w/ Contrast 09/11/2020 IMPRESSION: --Large lymph node mass in the right lateral neck measuring 3.3 x 5 cm. This was markedly hypermetabolic on recent PET and is consistent with tumor. This has developed since the prior neck CT of 04/29/2020. In this patient with a history of lymphoma, this could be a new area of lymphoma versus metastatic carcinoma. --Interval improvement in lymph nodes elsewhere in the neck including anterior to the right submandibular gland and in the posteroinferior neck bilaterally.  Biopsies revealed:  05/20/2020 DIAGNOSIS:  - Monoclonal B-cell population with expression of CD10, see comment.  COMMENT:  - There is a monoclonal B-cell population with expression of CD10. The findings are consistent with a non-Hodgkin B-cell lymphoma  Past/Anticipated interventions by otolaryngology, if any:  10/22/2020 Dr. Izora Gala --Attempt at aspiration for symptom relief, but no liquid or purulence was obtained  05/07/2020 Dr. Melida Quitter --Needle aspiration of right neck mass (needle was then inserted into the mass three times but no fluid could be aspirated)  Past/Anticipated interventions by medical oncology, if any:  Under care of Dr. Narda Rutherford 10/25/2020 --new baseline PET CT scan unfortunately showed progression of the right cervical lymph node.  We are currently planning for 2nd line therapy with R-miniCHOP(which would also be effective if patient is having transformation of the tumor).  -- patient completed PET, Port, and TTE  prior to start of R-miniCHOP. --prior PET CT findings are consistent with a Stage III follicular lymphoma. Due to rapidly enlarging submandibular lymph node we proceeded with treatment.  --imaging shows stable disease save for a single enlarging submandibular lymph node.  Prior biopsy has shown transformation into a more aggressive lymphoma.  --started Cycle 1 Day 1 of R-Benda chemotherapy on 06/12/2020 --completed Cycle 3 Day of R-Benda. Unfortunately he had progression for which we changed his therapy to R-miniCHOP.  --08/28/2020 was Day 1 of Cycle 1 of R-miniCHOP. Prior to start of Cycle 3 Day 1 he was noted to have increased size of the lymph node.  Plan: -- ENT notes lesion is too large for excisional biopsy.  --Given that ENT is unable to excise this lesion will refer to radiation oncology for palliative radiation to the lesion --The next chemotherapy line would be gemcitabine and oxaliplatin.  My preference would be to have intervention radiation oncology before starting this regimen. --patient to see Rad/Onc next Tuesday (10/29/2020)   Nutrition Status Yes No Comments  Weight changes? '[]'$  '[x]'$    Swallowing concerns? '[]'$  '[x]'$    PEG? '[]'$  '[x]'$     Referrals Yes No Comments  Social Work? '[x]'$  '[]'$    Dentistry? '[]'$  '[x]'$    Swallowing therapy? '[x]'$  '[]'$    Nutrition? '[x]'$  '[]'$    Med/Onc? '[x]'$  '[]'$  Dr. Narda Rutherford   Recurrent fevers, or drenching night sweats, if any:  Patient denies  SAFETY ISSUES: Prior radiation? No Pacemaker/ICD? No Possible current pregnancy? N/A Is the patient on methotrexate? No  Current Complaints / other details:  Reports pain at night that he is currently managing with PO Dilaudid. Mass has also begun draining yellow/clear/brown fluid

## 2020-10-28 NOTE — Progress Notes (Signed)
Radiation Oncology         [phone] ________________________________  Initial Outpatient Consultation  Name: Brian Castillo MRN: [id number]  Date: 10/29/2020  DOB: 06-26-1940  HM:CNOBS, Jenny Reichmann, MD  Orson Slick, MD   REFERRING PHYSICIAN: Orson Slick, MD  DIAGNOSIS:    ICD-10-CM   1. 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)  C82.51 Ambulatory referral to Social Work    Ambulatory referral to Social Work    2. Screening for hypothyroidism  Z13.29 TSH    3. Anemia, unspecified type  D64.9 TSH    4. 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 male who presented with an enlarged lymph node of the neck in November 2021. Patient reported that the enlarged lymph node was first noticed by his dentist who instructed the patient to consult with his PCP. Patients PCP prescribed amoxicillin in treatment of this, however enlarged node failed to resolve. Following his third failed round of antibiotics, patient underwent neck CT which demonstrated numerous enlarged lymph nodes; especially in the right submandibular space  Biopsy of cervical lymph node on 02/05/21 revealed: non-Hodgkin B-cell lymphoma, most consistent with follicular lymphoma. "The overall findings are consistent with  non-Hodgkin B-cell lymphoma, follicular type.  Despite small biopsy material, the changes favor a high-grade process."  Subsequently, the patient was referred to Dr. Lorenso Courier on 02/11/21 for further evaluation and management. Per the consult note; the patient stated that he has been losing weight unintentionally since September/ October 2021. Otherwise, the patient denied any and all other symptoms.  On 05/07/20 the patient met with Dr. Redmond Baseman for further evaluation. During this  visit, the patient was noted to report a sudden decrease in size of the right sided neck mass. Dr. Redmond Baseman attempted needle aspiration of the mass during this visit, however, no fluid was able to be extracted. Subsequently, Dr. Redmond Baseman reccommended the patient to undergo excisional biopsy under anesthesia. Excisional biopsy performed on 05/20/20 revealed: monoclonal B-cell population with expression of CD10; consistent with non-Hodgkin B-cell lymphoma. The patient soon after followed up with Dr. Lorenso Courier on 05/29/20 where the patient reported the mass to be warm and itchy, otherwise, the patient continued to deny any additional symptoms.   Per recommendation of Dr. Lorenso Courier, the patient opted to proceed with R-Benda chemotherapy; first dose on 06/12/20.   Of note: the patient presented to the [facility] ED on 06/25/20 with fever and fatigue. Chest x-ray taken during ED course revealed no acute findings. Pt was discharged and recommended to maintain close follow up with Dr. Lorenso Courier  Over the course of his interval history, right cervical lymph node continued to increase in size (detailed on imaging below). During a follow-up visit with Dr. Lorenso Courier on 08/23/20, the patient reported the mass to be tender, sore, and growing rapidly. The patient was otherwise noted to be tolerating chemo treatment well and denied any and all other symptoms. Dr. Lorenso Courier noted no concerning findings in regards to other previously identified lymph nodes.  The patient soon after presented to the ED on 09/08/20 with neck pain, primarily focused to the back of his neck. Patient reported that pain medication provided no relief. He was given ativan, morphine, and toradol during ED course and discharged home with  valium Rx. The patient again returned to the ED on 09/11/20 with progression of neck pain and new accompanied headache. CT of neck ordered revealed further development of the large right lateral neck lymph node mass; measuring 3.3 x 5 cm.  Right lateral neck mass noted as hypermetabolic on recent PET, and consistent with tumor. Otherwise, interval improvement was noted to addintional neck lymph nodes; including nodes located anteriorly to the right submandibular gland, and in the posteroinferior neck bilaterally. Head CT additionally ordered demonstrated no acute or chronic findings of concern.   The patients most recently presented to the ED on 09/13/20 with continued neck pain in addition to new onset of scalp pain/ sensitivity. Pt stated that his neck felt stiff, and pain is focused mainly to occipital up to parietal area. Also has sharp shooting pain up left neck and pain around exterior ear. He reported neck and scalp pain to interfere with sleep, and was noted to have significant decrease ROM due to neck pain.  Pertinent imaging thus far includes the following:  --PET/CT on 02/22/20 ordered by Dr. Lorenso Courier which demonstrated: findings consistent with a Stage III follicular lymphoma (findings detailed from imaging below). --CT of neck on 04/29/20 demonstrating: significant increase in size of right submandibular node; with new central cystic changes or necrosis. Considerations were noted to include spontaneous necrosis and superimposed infection (given infiltration of adjacent fat inferiorly). 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  --CT of the chest abdomen and pelvis on 04/29/20 which demonstrated: no significant change in the size of the supraclavicular and upper abdominal lymph nodes. No new or enlarging thoracic, abdominal or pelvic lymph nodes were visualized. Also seen was the similar appearance of mesenteric stranding and nodularity (noted nonspecific). Of note: scattered bilateral pulmonary nodules were seen, the largest of which measuring 6 mm; including a new 5 mm right lower lobe nodule favoring infectious or inflammatory  etiology. --Restaging PET on 08/22/20 which demonstrated deauville 5 changes with marked enlargement of submandibular lymph node on the right; with markedly increased metabolic activity compared to previous imaging. Imaging additionally revealed resolution of left neck lymph nodes and upper abdominal lymph node (no longer visible). Otherwise, no new or progressive findings were seen.   Swallowing issues, if any: N/A  Weight Changes: Unintentional weight loss starting in September/October of 2021; weight recently reported stable  Pain status: Pain unresolved by medication. Reports recent neck and scalp pain as detailed above; is still in a significant amount of pain and is very uncomfortable (per visit with Dr. Lorenso Courier on 10/25/20). Lip numbness, right.  Other symptoms: Trouble sleeping due to neck and scalp pain; reports this does not improve with any positioning of his head when he tries to sleep. During most recent visit with Dr. Lorenso Courier on 10/25/20, pt is noted to barely be able to sleep at night due to pain and appears quite tired.  Tobacco history, if any: Previous smoker; smoked in the 1950s until approximately 1984  ETOH abuse, if any: Reports drinking about 2 glasses of wine per day prior to recent onset of pain.  Prior cancers, if any: none  I personally reviewed his imaging and I discussed the patient with Dr. Lorenso Courier.  Dr. Lorenso Courier is concerned about the right submandibular mass that has been refractory to chemotherapy.  Aspiration of this mass was attempted last week but could not be accomplished by Dr. Constance Holster.  The mass  is growing rapidly.  PREVIOUS RADIATION THERAPY: No  PAST MEDICAL HISTORY:  has a past medical history of Allergy, Arthritis, Cancer ([place]), Cataract, Colon polyps, GERD (gastroesophageal reflux disease), Glaucoma, Hyperlipidemia, Hypertension, Osteoarthritis of CMC joint of thumb, and Seizures ([place]).    PAST SURGICAL HISTORY: Past Surgical History:  Procedure  Laterality Date   CARPOMETACARPEL SUSPENSION PLASTY Right 08/24/2014   Procedure: RIGHT CMC ARTHOPLASTY WITH DOUBLE TENDON TRANSFER AND REPAIR  RECONSTRUCTION ;  Surgeon: Roseanne Kaufman, MD;  Location: [facility];  Service: Orthopedics;  Laterality: Right;   CHOLECYSTECTOMY     COLONOSCOPY     IR IMAGING GUIDED PORT INSERTION  08/14/2020   IR RADIOLOGIST EVAL & MGMT  08/20/2020   KNEE ARTHROSCOPY  2003   bilateral   POLYPECTOMY      FAMILY HISTORY: family history is not on file.  SOCIAL HISTORY:  reports that he has quit smoking. He has never used smokeless tobacco. He reports current alcohol use of about 6.0 standard drinks per week. He reports that he does not use drugs.  ALLERGIES: Brimonidine tartrate-timolol, Lisinopril, and Netarsudil dimesylate  MEDICATIONS:  Current Outpatient Medications  Medication Sig Dispense Refill   acyclovir (ZOVIRAX) 400 MG tablet Take 1 tablet (400 mg total) by mouth 2 (two) times daily. (Patient not taking: Reported on 10/25/2020) 60 tablet 5   allopurinol (ZYLOPRIM) 300 MG tablet Take 300 mg by mouth daily.     amLODipine (NORVASC) 5 MG tablet Take 5 mg by mouth Daily.      aspirin 81 MG tablet Take 81 mg by mouth daily.     Calcium Carb-Cholecalciferol (CALCIUM 600 + D PO) Take 1 tablet by mouth daily.     cholecalciferol (VITAMIN D) 1000 units tablet Take 1,000 Units by mouth daily.     cyclobenzaprine (FLEXERIL) 5 MG tablet Take 1 tablet (5 mg total) by mouth 3 (three) times daily as needed for muscle spasms. 30 tablet 0   gabapentin (NEURONTIN) 300 MG capsule Take 1 capsule (300 mg total) by mouth 3 (three) times daily. Take 1 capsule (300 mg total) by mouth twice a day and 2 capsules (631m) at bedtime 120 capsule 2   HYDROmorphone (DILAUDID) 2 MG tablet Take 1 tablet (2 mg total) by mouth every 4 (four) hours as needed for severe pain. 15 tablet 0   ibuprofen (ADVIL) 200 MG tablet Take 400 mg by mouth every 6 (six) hours as needed for  mild pain.     Latanoprostene Bunod (VYZULTA) 0.024 % SOLN Place 1 drop into both eyes at bedtime.     lidocaine-prilocaine (EMLA) cream Apply 1 application topically as needed. (Patient taking differently: Apply 1 application topically daily as needed (port access).) 30 g 0   metoprolol succinate (TOPROL-XL) 25 MG 24 hr tablet Take 25 mg by mouth Daily.      oxyCODONE (OXY IR/ROXICODONE) 5 MG immediate release tablet Take 2-3 tablets (10-15 mg total) by mouth every 4 (four) hours as needed for severe pain. 120 tablet 0   phenytoin (DILANTIN) 100 MG ER capsule Take 200 mg by mouth 2 (two) times daily.     predniSONE (DELTASONE) 20 MG tablet Take 3 tablets (60 mg total) by mouth daily with breakfast. 15 tablet 0   rosuvastatin (CRESTOR) 20 MG tablet Take 20 mg by mouth daily.     senna-docusate (SENOKOT-S) 8.6-50 MG tablet Take 2 tablets by mouth at bedtime as needed for moderate constipation. 60 tablet 0   timolol (BETIMOL)  0.5 % ophthalmic solution Place 1 drop into both eyes 2 (two) times daily.     No current facility-administered medications for this encounter.    REVIEW OF SYSTEMS:  Notable for that above.   PHYSICAL EXAM:  weight is 177 lb 6 oz (80.5 kg). His oral temperature is 98.4 F (36.9 C). His blood pressure is 138/64 and his pulse is 66. His respiration is 17 and oxygen saturation is 98%.   General: Alert and oriented, in no acute distress HEENT:  Extraocular movements are intact. Oropharynx is notable for no lesions in mouth or upper throat. Neck: Neck is notable for bulging bulky right submandibular mass which is several centimeters in dimension; skin is erythematous around this mass and there are 3 punctate openings in the skin with drainage over the mass.  No other obvious adenopathy palpated Heart: Regular in rate and rhythm with no murmurs, rubs, or gallops. Chest: Clear to auscultation bilaterally, with no rhonchi, wheezes, or rales. Abdomen: Soft, nontender, nondistended,  with no rigidity or guarding. Extremities: No cyanosis or edema. Skin as above  Psychiatric: Judgment and insight are intact. Affect is appropriate.   ECOG = 1  0 - Asymptomatic (Fully active, able to carry on all predisease activities without restriction)  1 - Symptomatic but completely ambulatory (Restricted in physically strenuous activity but ambulatory and able to carry out work of a light or sedentary nature. For example, light housework, office work)  2 - Symptomatic, <50% in bed during the day (Ambulatory and capable of all self care but unable to carry out any work activities. Up and about more than 50% of waking hours)  3 - Symptomatic, >50% in bed, but not bedbound (Capable of only limited self-care, confined to bed or chair 50% or more of waking hours)  4 - Bedbound (Completely disabled. Cannot carry on any self-care. Totally confined to bed or chair)  5 - Death   Eustace Pen MM, Creech RH, Tormey DC, et al. [phone]). "Toxicity and response criteria of the [facility]". [place] Oncol. 5 (6): 649-55   LABORATORY DATA:  Lab Results  Component Value Date   WBC 5.2 10/25/2020   HGB 10.9 (L) 10/25/2020   HCT 31.3 (L) 10/25/2020   MCV 97.8 10/25/2020   PLT 154 10/25/2020   CMP     Component Value Date/Time   NA 139 10/25/2020 1122   K 4.1 10/25/2020 1122   CL 103 10/25/2020 1122   CO2 27 10/25/2020 1122   GLUCOSE 107 (H) 10/25/2020 1122   BUN 19 10/25/2020 1122   CREATININE 0.81 10/25/2020 1122   CALCIUM 9.2 10/25/2020 1122   PROT 6.9 10/25/2020 1122   ALBUMIN 3.7 10/25/2020 1122   AST 15 10/25/2020 1122   ALT 12 10/25/2020 1122   ALKPHOS 131 (H) 10/25/2020 1122   BILITOT 0.3 10/25/2020 1122   GFRNONAA >60 10/25/2020 1122   GFRAA >60 08/22/2014 1130      No results found for: TSH   RADIOGRAPHY: As above    IMPRESSION/PLAN:  This is a delightful patient with B-cell non-Hodgkin's lymphoma refractory to chemotherapy.  It is behaving  like a high-grade process. I recommend radiotherapy for this patient.  Given the rapidly growing right neck mass we will start his treatment urgently tomorrow.  We will start by treating the mass for 2 weeks and then give an additional 3 weeks of treatment to the mass as well as the other involved sites in the bilateral neck that  resolved with chemotherapy.  He will get approximately 50 Gray in 25 fractions to the gross disease and 30 Gray in 15 fractions to previously involved sites in the bilateral neck.  IMRT will be medically necessary for parotid sparing.  No treatment recommended to the abdomen, this can be followed with imaging.  We discussed the potential risks, benefits, and side effects of radiotherapy. We talked in detail about acute and late effects. We discussed that some of the most bothersome acute effects may be mucositis, dysgeusia, salivary changes, skin irritation, hair loss, dehydration, weight loss and fatigue. We talked about late effects which include but are not necessarily limited to dysphagia, hypothyroidism, nerve injury, vascular injury, spinal cord injury, xerostomia, trismus, neck edema, and potential injury to any of the tissues in the head and neck region. No guarantees of treatment were given. A consent form was signed and placed in the patient's medical record. The patient is enthusiastic about proceeding with treatment. I look forward to participating in the patient's care.    Simulation (treatment planning) will take place today, start treatment tomorrow.  The patient and his wife are pleased with this plan.  TSH ordered for baseline due to risk of hypothyroidism from radiation to the neck    On date of service, in total, I spent 60 minutes on this encounter. Patient was seen in person.  __________________________________________   Eppie Gibson, MD  This document serves as a record of services personally performed by Eppie Gibson, MD. It was created on her behalf  by Roney Mans, a trained medical scribe. The creation of this record is based on the scribe's personal observations and the provider's statements to them. This document has been checked and approved by the attending provider.

## 2020-10-29 ENCOUNTER — Ambulatory Visit
Admission: RE | Admit: 2020-10-29 | Discharge: 2020-10-29 | Disposition: A | Discharge status: Home / Self Care | Payer: Medicare Other | Source: Ambulatory Visit | Attending: Radiation Oncology | Admitting: Radiation Oncology

## 2020-10-29 ENCOUNTER — Telehealth: Payer: Self-pay | Admitting: *Deleted

## 2020-10-29 ENCOUNTER — Telehealth: Payer: Self-pay

## 2020-10-29 ENCOUNTER — Encounter: Payer: Self-pay | Admitting: *Deleted

## 2020-10-29 ENCOUNTER — Encounter: Payer: Self-pay | Admitting: Radiation Oncology

## 2020-10-29 ENCOUNTER — Other Ambulatory Visit: Payer: Self-pay

## 2020-10-29 VITALS — BP 138/64 | HR 66 | Temp 98.4°F | Resp 17 | Wt 177.4 lb

## 2020-10-29 DIAGNOSIS — M542 Cervicalgia: Secondary | ICD-10-CM | POA: Insufficient documentation

## 2020-10-29 DIAGNOSIS — K219 Gastro-esophageal reflux disease without esophagitis: Secondary | ICD-10-CM | POA: Diagnosis not present

## 2020-10-29 DIAGNOSIS — Z86010 Personal history of colonic polyps: Secondary | ICD-10-CM | POA: Diagnosis not present

## 2020-10-29 DIAGNOSIS — I10 Essential (primary) hypertension: Secondary | ICD-10-CM | POA: Insufficient documentation

## 2020-10-29 DIAGNOSIS — C8251 Diffuse follicle center lymphoma, lymph nodes of head, face, and neck: Secondary | ICD-10-CM | POA: Insufficient documentation

## 2020-10-29 DIAGNOSIS — R918 Other nonspecific abnormal finding of lung field: Secondary | ICD-10-CM | POA: Diagnosis not present

## 2020-10-29 DIAGNOSIS — D649 Anemia, unspecified: Secondary | ICD-10-CM | POA: Diagnosis not present

## 2020-10-29 DIAGNOSIS — Z1329 Encounter for screening for other suspected endocrine disorder: Secondary | ICD-10-CM

## 2020-10-29 DIAGNOSIS — Z7982 Long term (current) use of aspirin: Secondary | ICD-10-CM | POA: Diagnosis not present

## 2020-10-29 DIAGNOSIS — Z510 Encounter for antineoplastic radiation therapy: Secondary | ICD-10-CM | POA: Insufficient documentation

## 2020-10-29 DIAGNOSIS — Z7952 Long term (current) use of systemic steroids: Secondary | ICD-10-CM | POA: Diagnosis not present

## 2020-10-29 DIAGNOSIS — E785 Hyperlipidemia, unspecified: Secondary | ICD-10-CM | POA: Insufficient documentation

## 2020-10-29 DIAGNOSIS — M1990 Unspecified osteoarthritis, unspecified site: Secondary | ICD-10-CM | POA: Insufficient documentation

## 2020-10-29 DIAGNOSIS — Z79899 Other long term (current) drug therapy: Secondary | ICD-10-CM | POA: Insufficient documentation

## 2020-10-29 DIAGNOSIS — Z87891 Personal history of nicotine dependence: Secondary | ICD-10-CM | POA: Diagnosis not present

## 2020-10-29 NOTE — Progress Notes (Signed)
Oncology Nurse Navigator Documentation   Met with patient during initial consult with Brian Castillo. He was accompanied by his wife Arbie Cookey. Further introduced myself as his/their Navigator, explained my role as a member of the Care Team. Provided New Patient Information packet: Contact information for physician, this navigator, other members of the Care Team Advance Directive information ([place] blue pamphlet with LCSW insert); provided [name] AD booklet at his request,  Fall Prevention Patient [place] Information sheet Symptom Management Clinic information [facility] campus map with highlight of [place] with post-consult appt scheduling. They verbalized understanding of information provided. I encouraged them to call with questions/concerns moving forward.  Harlow Asa, RN, BSN, OCN Head & Neck Oncology Nurse [place] at [place] [phone]

## 2020-10-29 NOTE — Progress Notes (Signed)
[place] Work  Initial Assessment   Brian Castillo is a 80 y.o. year old male contacted by phone. Clinical Social Work was referred by radiation oncology for assessment of psychosocial needs.   SDOH (Social Determinants of Health) assessments performed: No   Distress Screen completed: Yes ONCBCN DISTRESS SCREENING 10/29/2020  Screening Type Initial Screening  Distress experienced in past week (1-10) 6  Physical Problem type Pain;Sleep/insomnia  Physician notified of physical symptoms Yes  Referral to clinical psychology No  Referral to clinical social work Yes  Referral to dietition Yes  Referral to financial advocate No  Referral to support programs Yes  Referral to palliative care No      Family/Social Information:  Housing Arrangement: patient lives with his wife Family members/support persons in your life? Family Transportation concerns: no  Employment: Retired. Income source: Paediatric nurse concerns: No Type of concern: None Food access concerns: no Religious or spiritual practice: no Medication Concerns: no  Services Currently in place:  no service needs indicated at this time.  Coping/ Adjustment to diagnosis: Patient understands treatment plan and what happens next? yes Concerns about diagnosis and/or treatment:  managing side effects through out the treatment process. Patient reported stressors: Adjusting to my illness Hopes and priorities: maintain independence throughout the treatment process. Patient enjoys time with family/ friends Current coping skills/ strengths: Ability for insight, Capable of independent living, Communication skills, and Supportive family/friends    SUMMARY: Patient did not express any needs at this time.  Patient maintains positive about the treatment process.  Patient is currently working through the large amount of information he has been given through out the treatment planning process.  Patient was  appreciative of CSW contact and stated he could contact CSW with questions or concerns.    Clinical Social Work Clinical Goal(s):  patient will work with SW to address concerns related to potential barriers to treatment and resources.   CSW will also be available for emotional support for patient and family.  Interventions: Discussed common feeling and emotions when being diagnosed with cancer, and the importance of support during treatment Informed patient of the support team roles and support services at [facility] Provided CSW contact information and encouraged patient to call with any questions or concerns    Follow Up Plan: Patient will contact CSW with any support or resource needs Patient verbalizes understanding of plan: Yes  Brian Castillo, MSW, LCSW, OSW-C Clinical Social Worker [place] [phone]        Brian Castillo , LCSW

## 2020-10-29 NOTE — Telephone Encounter (Signed)
CALLED PATIENT TO ASK ABOUT GETTING LAB TOMORROW @ 3:30 PM, PATIENT AGREED TO DO SO, UNLESS DR. Virgina Jock DREW A TSH ON HIM YESTERDAY, DR. Virgina Jock' OFFICE HAS BEEN CALLED AND I AM WAITING FOR A RETURN CALL LETTING ME KNOW IF A TSH HAS BEEN DRAWN

## 2020-10-29 NOTE — Telephone Encounter (Signed)
Pt LM stating he has some questions regarding his rx's.  I have called the pt back and LM returning his call.

## 2020-10-30 ENCOUNTER — Ambulatory Visit
Admission: RE | Admit: 2020-10-30 | Discharge: 2020-10-30 | Disposition: A | Discharge status: Home / Self Care | Payer: Medicare Other | Source: Ambulatory Visit | Attending: Radiation Oncology | Admitting: Radiation Oncology

## 2020-10-30 ENCOUNTER — Telehealth: Payer: Self-pay | Admitting: *Deleted

## 2020-10-30 ENCOUNTER — Ambulatory Visit: Payer: Medicare Other

## 2020-10-30 DIAGNOSIS — C8251 Diffuse follicle center lymphoma, lymph nodes of head, face, and neck: Secondary | ICD-10-CM | POA: Diagnosis not present

## 2020-10-30 NOTE — Progress Notes (Signed)
Oncology Nurse Navigator Documentation   To provide support, encouragement and care continuity, met with [name] for after his initial RT.   I reviewed the 2-step treatment process, answered questions.  [name] completed treatment without difficulty, denied questions/concerns. I reviewed the registration/arrival procedure for subsequent treatments. I encouraged him to call me with questions/concerns as tmts proceed.   Harlow Asa RN, BSN, OCN Head & Neck Oncology Nurse [place] at [facility] Phone # [phone]  Fax # [phone]

## 2020-10-31 ENCOUNTER — Telehealth: Payer: Self-pay | Admitting: *Deleted

## 2020-10-31 ENCOUNTER — Ambulatory Visit
Admission: RE | Admit: 2020-10-31 | Discharge: 2020-10-31 | Disposition: A | Discharge status: Home / Self Care | Payer: Medicare Other | Source: Ambulatory Visit | Attending: Radiation Oncology | Admitting: Radiation Oncology

## 2020-10-31 ENCOUNTER — Other Ambulatory Visit: Payer: Self-pay

## 2020-10-31 ENCOUNTER — Encounter: Payer: Self-pay | Admitting: Hematology and Oncology

## 2020-10-31 DIAGNOSIS — C8251 Diffuse follicle center lymphoma, lymph nodes of head, face, and neck: Secondary | ICD-10-CM | POA: Diagnosis not present

## 2020-10-31 MED ORDER — HYDROMORPHONE HCL 2 MG PO TABS: 2.0000 mg | ORAL_TABLET | ORAL | 0 refills | Status: DC | PRN

## 2020-10-31 NOTE — Addendum Note (Signed)
Addended by: Dede Query T on: 10/31/2020 10:48 AM   Modules accepted: Orders

## 2020-10-31 NOTE — Telephone Encounter (Signed)
Spoke with patient regarding his upcoming radiation treatments. He will have his 1st one this afternoon. He is using the dilaudid at night for pain control with varying results. But overall-he has better pain control though not complete relief by any means. It can reduce his pain from 10/10 to a 6-7/10. Reviewed side effects from radiation-especially dry mouth, fatigue, painful swallowing. Encouraged increased fluid intake. Advised to call with any change in symptoms.  He is aware of seeing Dr. Lorenso Courier and Dr. Mickeal Skinner next week as well as radiation. He says his radiation is 5 weeks long.  Dr. Lanell Persons mentioned radiating the left side of his neck later. He has questions about that. Encouraged to discuss with Dr. Lorenso Courier and Dr. Lanell Persons. Advised to call later this week if he needs refill of dliaudid before the weekend. Pt voiced understanding to all of the above.

## 2020-10-31 NOTE — Telephone Encounter (Signed)
Received call from pt stating that he needs a refill of his dilaudid. He had his 1st radiation treatment yesterday. The pain in his right neck started much earlier in the evening and was difficult to get under control. He ended up taking 3 tablets of the dilaudid over the course of 12 hours.  Dede Query, PA made aware

## 2020-11-01 ENCOUNTER — Ambulatory Visit
Admission: RE | Admit: 2020-11-01 | Discharge: 2020-11-01 | Disposition: A | Discharge status: Home / Self Care | Payer: Medicare Other | Source: Ambulatory Visit | Attending: Radiation Oncology | Admitting: Radiation Oncology

## 2020-11-01 DIAGNOSIS — C8251 Diffuse follicle center lymphoma, lymph nodes of head, face, and neck: Secondary | ICD-10-CM | POA: Diagnosis not present

## 2020-11-04 ENCOUNTER — Ambulatory Visit
Admission: RE | Admit: 2020-11-04 | Discharge: 2020-11-04 | Disposition: A | Discharge status: Home / Self Care | Payer: Medicare Other | Source: Ambulatory Visit | Attending: Radiation Oncology | Admitting: Radiation Oncology

## 2020-11-04 ENCOUNTER — Other Ambulatory Visit: Payer: Self-pay

## 2020-11-04 DIAGNOSIS — C8251 Diffuse follicle center lymphoma, lymph nodes of head, face, and neck: Secondary | ICD-10-CM | POA: Diagnosis not present

## 2020-11-05 ENCOUNTER — Inpatient Hospital Stay (HOSPITAL_BASED_OUTPATIENT_CLINIC_OR_DEPARTMENT_OTHER): Payer: Medicare Other | Admitting: Internal Medicine

## 2020-11-05 ENCOUNTER — Other Ambulatory Visit: Payer: Self-pay | Admitting: Internal Medicine

## 2020-11-05 ENCOUNTER — Ambulatory Visit
Admission: RE | Admit: 2020-11-05 | Discharge: 2020-11-05 | Disposition: A | Discharge status: Home / Self Care | Payer: Medicare Other | Source: Ambulatory Visit | Attending: Radiation Oncology | Admitting: Radiation Oncology

## 2020-11-05 DIAGNOSIS — C8511 Unspecified B-cell lymphoma, lymph nodes of head, face, and neck: Secondary | ICD-10-CM | POA: Diagnosis not present

## 2020-11-05 DIAGNOSIS — M5481 Occipital neuralgia: Secondary | ICD-10-CM | POA: Diagnosis not present

## 2020-11-05 DIAGNOSIS — C8251 Diffuse follicle center lymphoma, lymph nodes of head, face, and neck: Secondary | ICD-10-CM | POA: Diagnosis not present

## 2020-11-05 MED ORDER — GABAPENTIN 300 MG PO CAPS: 600.0000 mg | ORAL_CAPSULE | Freq: Three times a day (TID) | ORAL | 1 refills | Status: DC

## 2020-11-05 NOTE — Progress Notes (Signed)
[facility] [place], [place] [address] [phone]   New Patient Evaluation  Date of Service: 11/05/20 Patient Name: Brian Castillo Patient MRN: [id number] Patient DOB: September 09, 1940 Provider: Ventura Sellers, MD  Identifying Statement:  Brian Castillo is a 80 y.o. male with Bilateral occipital neuralgia who presents for initial consultation and evaluation regarding cancer associated neurologic deficits.    Referring Provider: Shon Baton, MD [address] 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  Meghan CREDENCE GIANNOTTI presents today with neuropathic complaint.  He describes tingling or burning sensation that starts in back of neck, runs up along top of head.  Frequency is intermittent.  He does have neck pain, but his is unrelated and from large lateral neck mass. Symptoms started in June this year after first infusion of Vincristine with Dr. Lorenso Courier.  He has dosed Gabapentin '300mg'$  three times per day which has helped, but not completely cleared the sensory abnormality.  Symptoms are reproducible with pressure on back of head (such as laying down).  No issues with feet or hands.  Medications: Current Outpatient Medications on File Prior to Visit  Medication Sig Dispense  Refill   allopurinol (ZYLOPRIM) 300 MG tablet Take 300 mg by mouth daily.     amLODipine (NORVASC) 5 MG tablet Take 5 mg by mouth Daily.      aspirin 81 MG tablet Take 81 mg by mouth daily.     Calcium Carb-Cholecalciferol (CALCIUM 600 + D PO) Take 1 tablet by mouth daily.     cholecalciferol (VITAMIN D) 1000 units tablet Take 1,000 Units by mouth daily.     gabapentin (NEURONTIN) 300 MG capsule Take 1 capsule (300 mg total) by mouth 3 (three) times daily. Take 1 capsule (300 mg total) by mouth twice a day and 2 capsules ('600mg'$ ) at bedtime 120 capsule 2   HYDROmorphone (DILAUDID) 2 MG tablet Take 1 tablet (2 mg total) by mouth every 4 (four) hours as needed for severe pain. 30 tablet 0   ibuprofen (ADVIL) 200 MG tablet Take 400 mg by mouth every 6 (six) hours as needed for mild pain.     Latanoprostene Bunod (VYZULTA) 0.024 % SOLN Place 1 drop into both eyes at bedtime.     lidocaine-prilocaine (EMLA) cream Apply 1 application topically as needed. (Patient taking differently: Apply 1 application topically daily as needed (port access).) 30 g 0   metoprolol succinate (TOPROL-XL) 25 MG 24 hr tablet Take 25 mg by mouth Daily.      phenytoin (DILANTIN) 100 MG ER capsule Take 200 mg by mouth 2 (two) times daily.     rosuvastatin (CRESTOR) 20 MG tablet Take 20 mg by mouth daily.     timolol (BETIMOL) 0.5 %  ophthalmic solution Place 1 drop into both eyes 2 (two) times daily.     acyclovir (ZOVIRAX) 400 MG tablet Take 1 tablet (400 mg total) by mouth 2 (two) times daily. (Patient not taking: No sig reported) 60 tablet 5   cyclobenzaprine (FLEXERIL) 5 MG tablet Take 1 tablet (5 mg total) by mouth 3 (three) times daily as needed for muscle spasms. (Patient not taking: Reported on 11/05/2020) 30 tablet 0   oxyCODONE (OXY IR/ROXICODONE) 5 MG immediate release tablet Take 2-3 tablets (10-15 mg total) by mouth every 4 (four) hours as needed for severe pain. (Patient not taking: Reported on 11/05/2020) 120 tablet 0    predniSONE (DELTASONE) 20 MG tablet Take 3 tablets (60 mg total) by mouth daily with breakfast. (Patient not taking: Reported on 11/05/2020) 15 tablet 0   senna-docusate (SENOKOT-S) 8.6-50 MG tablet Take 2 tablets by mouth at bedtime as needed for moderate constipation. (Patient not taking: Reported on 11/05/2020) 60 tablet 0   No current facility-administered medications on file prior to visit.    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   Seizures ([facility])    last seizure was in 1988   Past Surgical History:  Past Surgical History:  Procedure Laterality Date   CARPOMETACARPEL SUSPENSION PLASTY Right 08/24/2014   Procedure: RIGHT CMC ARTHOPLASTY WITH DOUBLE TENDON TRANSFER AND REPAIR  RECONSTRUCTION ;  Surgeon: Roseanne Kaufman, MD;  Location: [facility];  Service: Orthopedics;  Laterality: Right;   CHOLECYSTECTOMY     COLONOSCOPY     IR IMAGING GUIDED PORT INSERTION  08/14/2020   IR RADIOLOGIST EVAL & MGMT  08/20/2020   KNEE ARTHROSCOPY  2003   bilateral   POLYPECTOMY     Social History:  Social History   Socioeconomic History   Marital status: Married    Spouse name: Not on file   Number of children: Not on file   Years of education: Not on file   Highest education level: Not on file  Occupational History   Not on file  Tobacco Use   Smoking status: Former    Smokeless tobacco: Never   Tobacco comments:    quit 1984  Substance and Sexual Activity   Alcohol use: Yes    Alcohol/week: 6.0 standard drinks    Types: 6 Glasses of wine per week   Drug use: No   Sexual activity: Not on file  Other Topics Concern   Not on file  Social History Narrative   Not on file   Social Determinants of Health   Financial Resource Strain: Not on file  Food Insecurity: Not on file  Transportation Needs: Not on file  Physical Activity: Not on file  Stress: Not on file  Social Connections: Not on file  Intimate  Partner Violence: Not on file   Family History:  Family History  Problem Relation Age of Onset   Colon cancer Neg Hx    Colon polyps Neg Hx     Review of Systems: Constitutional: Doesn't report fevers, chills or abnormal weight loss Eyes: Doesn't report blurriness of vision Ears, nose, mouth, throat, and face: Doesn't report sore throat Respiratory: Doesn't report cough, dyspnea or wheezes Cardiovascular: Doesn't report palpitation, chest discomfort  Gastrointestinal:  Doesn't report nausea, constipation, diarrhea GU: Doesn't report incontinence Skin: Doesn't report skin rashes Neurological: Per HPI Musculoskeletal: Doesn't report joint pain Behavioral/Psych: Doesn't report anxiety  Physical Exam: Vitals:   11/05/20 0957  BP: (!) 149/68  Pulse: 66  Resp: 19  Temp: 97.6 F (36.4 C)  SpO2: 99%   KPS: 80. General: Alert, cooperative, pleasant, in no acute distress Head: Normal EENT: Lateral neck mass Lungs: Resp effort normal Cardiac: Regular rate Abdomen: Non-distended abdomen Skin: No rashes cyanosis or petechiae. Extremities: No clubbing or edema  Neurologic Exam: Mental Status: Awake, alert, attentive to examiner. Oriented to self and environment. Language is fluent with intact comprehension.  Cranial Nerves: Visual acuity is grossly normal. Visual fields are full. Extra-ocular movements intact. No ptosis. Face is  symmetric Motor: Tone and bulk are normal. Power is full in both arms and legs. Reflexes are symmetric, no pathologic reflexes present.  Sensory: Intact to light touch Gait: Normal.   Labs: I have reviewed the data as listed    Component Value Date/Time   NA 139 10/25/2020 1122   K 4.1 10/25/2020 1122   CL 103 10/25/2020 1122   CO2 27 10/25/2020 1122   GLUCOSE 107 (H) 10/25/2020 1122   BUN 19 10/25/2020 1122   CREATININE 0.81 10/25/2020 1122   CALCIUM 9.2 10/25/2020 1122   PROT 6.9 10/25/2020 1122   ALBUMIN 3.7 10/25/2020 1122   AST 15 10/25/2020 1122   ALT 12 10/25/2020 1122   ALKPHOS 131 (H) 10/25/2020 1122   BILITOT 0.3 10/25/2020 1122   GFRNONAA >60 10/25/2020 1122   GFRAA >60 08/22/2014 1130   Lab Results  Component Value Date   WBC 5.2 10/25/2020   NEUTROABS 3.1 10/25/2020   HGB 10.9 (L) 10/25/2020   HCT 31.3 (L) 10/25/2020   MCV 97.8 10/25/2020   PLT 154 10/25/2020    Assessment/Plan Bilateral occipital neuralgia  Norah J Schroader presents with clinical syndrome consistent with bilateral occipital neuralgia.  Etiology is multifactorial, pressure on upper cervical nerve roots from neck mass and subsequent surgery, as well as neurotoxic exposure to vincristine, 2 cycles.    We recommended either continued neuromodulation with oral medications, or greater occipital nerve blockade.  At this time he prefers increase in gabapentin, will start with '600mg'$  TID and increase from there if needed.  Alternate oral agents could also be considered if desired.   We spent twenty additional minutes teaching regarding the natural history, biology, and historical experience in the treatment of neurologic complications of cancer.   We appreciate the opportunity to participate in the care of Saban J Hiebert.   All questions were answered. The patient knows to call the clinic with any problems, questions or concerns. No barriers to learning were detected.  The total time spent in  the encounter was 40 minutes and more than 50% was on counseling and review of test results   Ventura Sellers, MD Medical Director of Neuro-Oncology [facility] 11/05/20 3:48 PM

## 2020-11-06 ENCOUNTER — Ambulatory Visit
Admission: RE | Admit: 2020-11-06 | Discharge: 2020-11-06 | Disposition: A | Discharge status: Home / Self Care | Payer: Medicare Other | Source: Ambulatory Visit | Attending: Radiation Oncology | Admitting: Radiation Oncology

## 2020-11-06 ENCOUNTER — Other Ambulatory Visit: Payer: Self-pay

## 2020-11-06 DIAGNOSIS — C8251 Diffuse follicle center lymphoma, lymph nodes of head, face, and neck: Secondary | ICD-10-CM | POA: Diagnosis not present

## 2020-11-06 NOTE — Telephone Encounter (Signed)
Refilled by Dr. Mickeal Skinner yesterday. Gardiner Rhyme, RN

## 2020-11-07 ENCOUNTER — Inpatient Hospital Stay (HOSPITAL_BASED_OUTPATIENT_CLINIC_OR_DEPARTMENT_OTHER): Payer: Medicare Other | Admitting: Hematology and Oncology

## 2020-11-07 ENCOUNTER — Inpatient Hospital Stay: Payer: Medicare Other

## 2020-11-07 ENCOUNTER — Other Ambulatory Visit: Payer: Self-pay | Admitting: Hematology and Oncology

## 2020-11-07 ENCOUNTER — Encounter: Payer: Self-pay | Admitting: Hematology and Oncology

## 2020-11-07 ENCOUNTER — Other Ambulatory Visit: Payer: Self-pay | Admitting: *Deleted

## 2020-11-07 ENCOUNTER — Ambulatory Visit
Admission: RE | Admit: 2020-11-07 | Discharge: 2020-11-07 | Disposition: A | Discharge status: Home / Self Care | Payer: Medicare Other | Source: Ambulatory Visit | Attending: Radiation Oncology | Admitting: Radiation Oncology

## 2020-11-07 VITALS — BP 133/59 | HR 64 | Temp 97.4°F | Resp 19 | Ht 68.0 in | Wt 175.3 lb

## 2020-11-07 DIAGNOSIS — Z95828 Presence of other vascular implants and grafts: Secondary | ICD-10-CM

## 2020-11-07 DIAGNOSIS — C8251 Diffuse follicle center lymphoma, lymph nodes of head, face, and neck: Secondary | ICD-10-CM

## 2020-11-07 DIAGNOSIS — C8331 Diffuse large B-cell lymphoma, lymph nodes of head, face, and neck: Secondary | ICD-10-CM | POA: Diagnosis not present

## 2020-11-07 DIAGNOSIS — Z1329 Encounter for screening for other suspected endocrine disorder: Secondary | ICD-10-CM

## 2020-11-07 DIAGNOSIS — M542 Cervicalgia: Secondary | ICD-10-CM

## 2020-11-07 DIAGNOSIS — C8511 Unspecified B-cell lymphoma, lymph nodes of head, face, and neck: Secondary | ICD-10-CM | POA: Diagnosis not present

## 2020-11-07 DIAGNOSIS — D649 Anemia, unspecified: Secondary | ICD-10-CM

## 2020-11-07 LAB — CMP (CANCER CENTER ONLY)
ALT: 15 U/L (ref 0–44)
AST: 17 U/L (ref 15–41)
Albumin: 3.6 g/dL (ref 3.5–5.0)
Alkaline Phosphatase: 128 U/L — ABNORMAL HIGH (ref 38–126)
Anion gap: 8 (ref 5–15)
BUN: 16 mg/dL (ref 8–23)
CO2: 29 mmol/L (ref 22–32)
Calcium: 9.6 mg/dL (ref 8.9–10.3)
Chloride: 102 mmol/L (ref 98–111)
Creatinine: 0.8 mg/dL (ref 0.61–1.24)
GFR, Estimated: >60 mL/min (ref >60)
Glucose, Bld: 102 mg/dL — ABNORMAL HIGH (ref 70–99)
Potassium: 4.1 mmol/L (ref 3.5–5.1)
Sodium: 139 mmol/L (ref 135–145)
Total Bilirubin: 0.3 mg/dL (ref 0.3–1.2)
Total Protein: 7.1 g/dL (ref 6.5–8.1)

## 2020-11-07 LAB — CBC WITH DIFFERENTIAL (CANCER CENTER ONLY)
Abs Immature Granulocytes: 0.01 10*3/uL (ref 0.00–0.07)
Basophils Absolute: 0 10*3/uL (ref 0.0–0.1)
Basophils Relative: 1 %
Eosinophils Absolute: 0.3 10*3/uL (ref 0.0–0.5)
Eosinophils Relative: 7 %
HCT: 32.9 % — ABNORMAL LOW (ref 39.0–52.0)
Hemoglobin: 11.5 g/dL — ABNORMAL LOW (ref 13.0–17.0)
Immature Granulocytes: 0 %
Lymphocytes Relative: 5 %
Lymphs Abs: 0.3 10*3/uL — ABNORMAL LOW (ref 0.7–4.0)
MCH: 33.6 pg (ref 26.0–34.0)
MCHC: 35 g/dL (ref 30.0–36.0)
MCV: 96.2 fL (ref 80.0–100.0)
Monocytes Absolute: 0.5 10*3/uL (ref 0.1–1.0)
Monocytes Relative: 11 %
Neutro Abs: 3.7 10*3/uL (ref 1.7–7.7)
Neutrophils Relative %: 76 %
Platelet Count: 184 10*3/uL (ref 150–400)
RBC: 3.42 MIL/uL — ABNORMAL LOW (ref 4.22–5.81)
RDW: 12.5 % (ref 11.5–15.5)
WBC Count: 4.8 10*3/uL (ref 4.0–10.5)
nRBC: 0 % (ref 0.0–0.2)

## 2020-11-07 LAB — TSH: TSH: 2.988 u[IU]/mL (ref 0.320–4.118)

## 2020-11-07 LAB — LACTATE DEHYDROGENASE: LDH: 361 U/L — ABNORMAL HIGH (ref 98–192)

## 2020-11-07 NOTE — Progress Notes (Signed)
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 Lorenso Courier  4) 02/22/2020: PET CT scan showed Deauville 4 and Deauville 5 adenopathy bilaterally in the neck. There is also a Deauville 4 left supraclavicular lymph node and a Deauville 4 right gastric lymph node 5) 04/24/2020: presents with rapid enlargement of his right submandibular lymph node.  6) 05/20/2020: partial excision of right submandibular lymph node with attempted fluid drainage. Pathology consistent with follicular lymphoma, no clear evidence of transformation.  7) 06/12/2020: Cycle 1 Day 1 of R-Benda. 8) 07/10/2020: Cycle 2 Day 1 of R-Benda. 9) 08/07/2020: Cycle 3 Day 1 of R-Benda. Noted to have enlargement of right cervical lymph node on exam.  10) 08/28/2020: Cycle 1 Day 1 of R-miniCHOP 11) 09/20/2020: Cycle 2 Day 2 of R-miniCHOP 12) 10/11/2020: Cycle 3 of R-miniCHOP HELD due to progression. 13) 10/30/2020: start of palliative radiation therapy  Interval History:  Brian Castillo 80 y.o. 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 The patient's last visit was on 8/12//2022. In the interim since the last visit he has started palliative radiation therapy to the problematic lymph node.   On exam today [name] is accompanied by his wife. He reports that his pain is under much better control.  He reports that he had some pain the first day of radiation but in  combination with the Dilaudid he is able to sleep through the night.  He is having some drainage with eschar having developed on his nose he also was seen by Dr. Mickeal Skinner of note that his pain was occipital neuralgia he can continue on gabapentin to treat.  He is not having any B symptoms at this point.  He reports that he is not having any issues with fevers, chills, sweats, nausea, vomiting or diarrhea.  A full 10 point ROS is listed below.  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   Seizures ([place])    last seizure was in 1988    SURGICAL HISTORY: Past Surgical History:  Procedure Laterality Date   CARPOMETACARPEL SUSPENSION PLASTY Right 08/24/2014   Procedure: RIGHT CMC ARTHOPLASTY WITH DOUBLE TENDON TRANSFER AND REPAIR  RECONSTRUCTION ;  Surgeon: Roseanne Kaufman, MD;  Location: [facility];  Service: Orthopedics;  Laterality: Right;   CHOLECYSTECTOMY     COLONOSCOPY     IR IMAGING GUIDED PORT INSERTION  08/14/2020   IR RADIOLOGIST EVAL & MGMT  08/20/2020   KNEE ARTHROSCOPY  2003   bilateral   POLYPECTOMY      SOCIAL HISTORY: Social History   Socioeconomic History   Marital status:  Married    Spouse name: Not on file   Number of children: Not on file   Years of education: Not on file   Highest education level: Not on file  Occupational History   Not on file  Tobacco Use   Smoking status: Former   Smokeless tobacco: Never   Tobacco comments:    quit 1984  Substance and Sexual Activity   Alcohol use: Yes    Alcohol/week: 6.0 standard drinks    Types: 6 Glasses of wine per week   Drug use: No   Sexual activity: Not on file  Other Topics Concern   Not on file  Social History Narrative    Not on file   Social Determinants of Health   Financial Resource Strain: Not on file  Food Insecurity: Not on file  Transportation Needs: Not on file  Physical Activity: Not on file  Stress: Not on file  Social Connections: Not on file  Intimate Partner Violence: Not on file    FAMILY HISTORY: Family History  Problem Relation Age of Onset   Colon cancer Neg Hx    Colon polyps Neg Hx     ALLERGIES:  is allergic to brimonidine tartrate-timolol, lisinopril, and netarsudil dimesylate.  MEDICATIONS:  Current Outpatient Medications  Medication Sig Dispense Refill   acyclovir (ZOVIRAX) 400 MG tablet Take 1 tablet (400 mg total) by mouth 2 (two) times daily. (Patient not taking: No sig reported) 60 tablet 5   allopurinol (ZYLOPRIM) 300 MG tablet Take 300 mg by mouth daily.     amLODipine (NORVASC) 5 MG tablet Take 5 mg by mouth Daily.      aspirin 81 MG tablet Take 81 mg by mouth daily.     Calcium Carb-Cholecalciferol (CALCIUM 600 + D PO) Take 1 tablet by mouth daily.     cholecalciferol (VITAMIN D) 1000 units tablet Take 1,000 Units by mouth daily.     cyclobenzaprine (FLEXERIL) 5 MG tablet Take 1 tablet (5 mg total) by mouth 3 (three) times daily as needed for muscle spasms. (Patient not taking: Reported on 11/05/2020) 30 tablet 0   gabapentin (NEURONTIN) 300 MG capsule Take 2 capsules (600 mg total) by mouth 3 (three) times daily. Take 1 capsule (300 mg total) by mouth twice a day and 2 capsules ('600mg'$ ) at bedtime 180 capsule 1   HYDROmorphone (DILAUDID) 2 MG tablet Take 1 tablet (2 mg total) by mouth every 4 (four) hours as needed for severe pain. 30 tablet 0   ibuprofen (ADVIL) 200 MG tablet Take 400 mg by mouth every 6 (six) hours as needed for mild pain.     Latanoprostene Bunod (VYZULTA) 0.024 % SOLN Place 1 drop into both eyes at bedtime.     lidocaine-prilocaine (EMLA) cream Apply 1 application topically as needed. (Patient taking differently: Apply 1 application topically  daily as needed (port access).) 30 g 0   metoprolol succinate (TOPROL-XL) 25 MG 24 hr tablet Take 25 mg by mouth Daily.      oxyCODONE (OXY IR/ROXICODONE) 5 MG immediate release tablet Take 2-3 tablets (10-15 mg total) by mouth every 4 (four) hours as needed for severe pain. (Patient not taking: Reported on 11/05/2020) 120 tablet 0   phenytoin (DILANTIN) 100 MG ER capsule Take 200 mg by mouth 2 (two) times daily.     rosuvastatin (CRESTOR) 20 MG tablet Take 20 mg by mouth daily.     senna-docusate (SENOKOT-S) 8.6-50 MG tablet Take 2 tablets by mouth at bedtime as  needed for moderate constipation. (Patient not taking: Reported on 11/05/2020) 60 tablet 0   timolol (BETIMOL) 0.5 % ophthalmic solution Place 1 drop into both eyes 2 (two) times daily.     No current facility-administered medications for this visit.    REVIEW OF SYSTEMS:   Constitutional: ( - ) fevers, ( - )  chills , ( - ) night sweats Eyes: ( - ) blurriness of vision, ( - ) double vision, ( - ) watery eyes Ears, nose, mouth, throat, and face: ( - ) mucositis, ( - ) sore throat Respiratory: ( - ) cough, ( - ) dyspnea, ( - ) wheezes Cardiovascular: ( - ) palpitation, ( - ) chest discomfort, ( - ) lower extremity swelling Gastrointestinal:  ( - ) nausea, ( - ) heartburn, ( - ) change in bowel habits Skin: ( - ) abnormal skin rashes Lymphatics: ( - ) new lymphadenopathy, ( - ) easy bruising Neurological: ( - ) numbness, ( - ) tingling, ( - ) new weaknesses Behavioral/Psych: ( - ) mood change, ( - ) new changes  All other systems were reviewed with the patient and are negative.  PHYSICAL EXAMINATION: ECOG PERFORMANCE STATUS: 1 - Symptomatic but completely ambulatory  Vitals:   11/07/20 1144  BP: (!) 133/59  Pulse: 64  Resp: 19  Temp: (!) 97.4 F (36.3 C)  SpO2: 98%    Filed Weights   11/07/20 1144  Weight: 175 lb 4.8 oz (79.5 kg)     GENERAL: well appearing elderly Caucasian male in NAD  SKIN: skin color, texture,  turgor are normal, no rashes or significant lesions EYES: conjunctiva are pink and non-injected, sclera clear NECK: supple, non-tender LYMPH:  Single palpable right sided submandibular lymph node. Size is steady, though an eschar that drains has developed on it. Otherwise no other marked palpable lymphadenopathy in the cervical, axillary or supraclavicular lymph nodes.  LUNGS: clear to auscultation and percussion with normal breathing effort HEART: regular rate & rhythm and no murmurs and no lower extremity edema Musculoskeletal: no cyanosis of digits and no clubbing  PSYCH: alert & oriented x 3, fluent speech NEURO: no focal motor/sensory deficits  LABORATORY DATA:  I have reviewed the data as listed CBC Latest Ref Rng & Units 11/07/2020 10/25/2020 10/11/2020  WBC 4.0 - 10.5 K/uL 4.8 5.2 2.5(L)  Hemoglobin 13.0 - 17.0 g/dL 11.5(L) 10.9(L) 10.4(L)  Hematocrit 39.0 - 52.0 % 32.9(L) 31.3(L) 29.9(L)  Platelets 150 - 400 K/uL 184 154 144(L)    CMP Latest Ref Rng & Units 11/07/2020 10/25/2020 10/11/2020  Glucose 70 - 99 mg/dL 102(H) 107(H) 133(H)  BUN 8 - 23 mg/dL '16 19 13  '$ Creatinine 0.61 - 1.24 mg/dL 0.80 0.81 0.82  Sodium 135 - 145 mmol/L 139 139 140  Potassium 3.5 - 5.1 mmol/L 4.1 4.1 4.3  Chloride 98 - 111 mmol/L 102 103 105  CO2 22 - 32 mmol/L '29 27 27  '$ Calcium 8.9 - 10.3 mg/dL 9.6 9.2 9.1  Total Protein 6.5 - 8.1 g/dL 7.1 6.9 6.6  Total Bilirubin 0.3 - 1.2 mg/dL 0.3 0.3 0.3  Alkaline Phos 38 - 126 U/L 128(H) 131(H) 123  AST 15 - 41 U/L 17 15 13(L)  ALT 0 - 44 U/L '15 12 11   '$ RADIOGRAPHIC STUDIES: I have personally reviewed the radiological images as listed and agreed with the findings in the report: stable lymphadenopathy with exception of the rapidly enlarging submandibular lymph node.   No results found.  ASSESSMENT &  PLAN Brian Castillo 80 y.o. 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 The patient's last visit was on  02/12/2020 at which time he establish care.  After review the labs, the records, discussion with the patient the findings are most consistent with a stage III follicular lymphoma based on the PET CT scan.  There are lymph nodes on both sides of diaphragm and therefore stage III would be the most appropriate designation.    Previously we discussed the nature of follicular lymphoma and the treatment options moving forward.  The options at this time would be observation, immunotherapy, or combination of chemotherapy and immunotherapy.  Given the lack of symptoms, cytopenias, and bulky disease at the time of presentation we decided to proceed with observation alone. Unfortunately due to rapid progression we need to consider treatment moving forward.  Additionally we discussed rituximab monotherapy with weekly dosing x4 doses followed by every 2 months dosing x4 as a treatment option as well as bendamustine and rituximab on days 1 and 2 of a 28-day cycle x6.  We discussed the side effects, risks and benefits, and possible outcomes of treatment.  The patient voiced his understanding of these options and wished to proceed with R-Benda.   Over the period of one month the patient was found to have a rapidly enlarging right submandibular lymph node.  Due to concern for this enlarging lymph node we ordered a CT scan of the chest abdomen pelvis to assess for progression elsewhere, but that was the only lymph node that was progressing. 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   On PET CT scan from 08/23/2020 the patient showed complete response of disease with every site with the exception of his right cervical lymph node which is increasing in size and brightness.  As such we have to discontinue first-line therapy with rituximab and Bendamustine and transition over to second line R mini CHOP.  R mini CHOP chemotherapy Cycle 1 Day1 started on 08/28/2020.  Unfortunately when he came  time to proceed to cycle 3 it was evident that the lymph node in the neck had started to grow again rapidly.  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  We are currently planning for 2nd line therapy with R-miniCHOP(which would also be effective if patient is having transformation of the tumor).  -- patient completed PET, Port, and TTE prior to start of R-miniCHOP. --prior PET CT findings are consistent with a Stage III follicular lymphoma. Due to rapidly enlarging submandibular lymph node we proceeded with treatment.  --imaging shows stable disease save for a single enlarging submandibular lymph node.  Prior biopsy has shown transformation into a more aggressive lymphoma.  --started Cycle 1 Day 1 of R-Benda chemotherapy on 06/12/2020 --completed Cycle 3 Day of R-Benda. Unfortunately he had progression for which we changed his therapy to R-miniCHOP.  --08/28/2020 was Day 1 of Cycle 1 of R-miniCHOP. Prior to start of Cycle 3 Day 1 he was noted to have increased size of the lymph node.  Plan: -- ENT notes lesion is too large for excisional biopsy at this time --patient is undergoing palliative radiation with Dr. Isidore Moos --The next chemotherapy line would be gemcitabine and oxaliplatin.  My preference would be to complete palliative radiation oncology before starting this regimen. --RTC in 2 weeks  for continue monitoring on radiation.   #Pain Control, improved --patient is having severe pain in his midline neck up to his scalp --extensive workup including CT/MRI scans of head/neck and LP have revealed no clear etiology for his findings --pain is responding well to gabapentin 300 BID with flexeril and oxycodone.  --oxycodone to 15 mg PO q 4H PRN with ibuprofen '600mg'$  Q6H PRN. --Dilaudid 2 mg p.o. for breakthrough pain --continue flexeril '5mg'$  q8H PRN.  --Continue to  monitor  #Medication Interaction --Allopurinol increases the levels of Dilantin. --We will work with the patient's primary care provider Dr. Shon Baton to monitor his Dilantin levels and adjust accordingly. For now we are following this weekly.  #Fever, resolved --Single episode of fever up to 101 F occurred on 06/25/2020. --Patient was not neutropenic at that time and was evaluated emergency department with infectious work-up including chest x-ray, blood cultures, and urine culture.  Data at that time showed no evidence of infectious disease. --No further fevers noted since that time.  We will continue to monitor.  #Supportive Care --chemotherapy education completed --zofran '8mg'$  q8H PRN and compazine '10mg'$  PO q6H for nausea --acyclovir '400mg'$  PO BID for VCZ prophylaxis -- allopurinol '300mg'$  PO daily for TLS prophylaxis -- port in place -- no pain medication required at this time.    Orders Placed This Encounter  Procedures   Phenytoin (Dilantin) level, free and total    Standing Status:   Standing    Number of Occurrences:   26    Standing Expiration Date:   11/07/2021    All questions were answered. The patient knows to call the clinic with any problems, questions or concerns.  A total of more than 30 minutes were spent on this encounter and over half of that time was spent on counseling and coordination of care as outlined above.   Ledell Peoples, MD Department of Hematology/Oncology [facility] Phone: [phone] Pager: [phone] Email: [email or web address]  11/07/2020 2:39 PM

## 2020-11-08 ENCOUNTER — Other Ambulatory Visit: Payer: Self-pay

## 2020-11-08 ENCOUNTER — Ambulatory Visit
Admission: RE | Admit: 2020-11-08 | Discharge: 2020-11-08 | Disposition: A | Discharge status: Home / Self Care | Payer: Medicare Other | Source: Ambulatory Visit | Attending: Radiation Oncology | Admitting: Radiation Oncology

## 2020-11-08 DIAGNOSIS — C8251 Diffuse follicle center lymphoma, lymph nodes of head, face, and neck: Secondary | ICD-10-CM | POA: Diagnosis not present

## 2020-11-08 LAB — PHENYTOIN LEVEL, FREE AND TOTAL
Phenytoin, Free: 1 ug/mL (ref 1.0–2.0)
Phenytoin, Total: 12.9 ug/mL (ref 10.0–20.0)

## 2020-11-11 ENCOUNTER — Ambulatory Visit
Admission: RE | Admit: 2020-11-11 | Discharge: 2020-11-11 | Disposition: A | Discharge status: Home / Self Care | Payer: Medicare Other | Source: Ambulatory Visit | Attending: Radiation Oncology | Admitting: Radiation Oncology

## 2020-11-11 ENCOUNTER — Other Ambulatory Visit: Payer: Self-pay

## 2020-11-11 DIAGNOSIS — C8251 Diffuse follicle center lymphoma, lymph nodes of head, face, and neck: Secondary | ICD-10-CM | POA: Diagnosis not present

## 2020-11-12 ENCOUNTER — Ambulatory Visit
Admission: RE | Admit: 2020-11-12 | Discharge: 2020-11-12 | Disposition: A | Discharge status: Home / Self Care | Payer: Medicare Other | Source: Ambulatory Visit | Attending: Radiation Oncology | Admitting: Radiation Oncology

## 2020-11-12 ENCOUNTER — Telehealth: Payer: Self-pay | Admitting: Hematology and Oncology

## 2020-11-12 DIAGNOSIS — C8251 Diffuse follicle center lymphoma, lymph nodes of head, face, and neck: Secondary | ICD-10-CM | POA: Diagnosis not present

## 2020-11-12 NOTE — Telephone Encounter (Signed)
Scheduled per los. Called and spoke with patients wife. Confirmed appt  

## 2020-11-13 ENCOUNTER — Other Ambulatory Visit: Payer: Self-pay

## 2020-11-13 ENCOUNTER — Ambulatory Visit
Admission: RE | Admit: 2020-11-13 | Discharge: 2020-11-13 | Disposition: A | Discharge status: Home / Self Care | Payer: Medicare Other | Source: Ambulatory Visit | Attending: Radiation Oncology | Admitting: Radiation Oncology

## 2020-11-13 DIAGNOSIS — C8251 Diffuse follicle center lymphoma, lymph nodes of head, face, and neck: Secondary | ICD-10-CM | POA: Diagnosis not present

## 2020-11-14 ENCOUNTER — Ambulatory Visit
Admission: RE | Admit: 2020-11-14 | Discharge: 2020-11-14 | Disposition: A | Discharge status: Home / Self Care | Payer: Medicare Other | Source: Ambulatory Visit | Attending: Radiation Oncology | Admitting: Radiation Oncology

## 2020-11-14 DIAGNOSIS — Z510 Encounter for antineoplastic radiation therapy: Secondary | ICD-10-CM | POA: Insufficient documentation

## 2020-11-14 DIAGNOSIS — C8251 Diffuse follicle center lymphoma, lymph nodes of head, face, and neck: Secondary | ICD-10-CM | POA: Diagnosis not present

## 2020-11-15 ENCOUNTER — Ambulatory Visit
Admission: RE | Admit: 2020-11-15 | Discharge: 2020-11-15 | Disposition: A | Discharge status: Home / Self Care | Payer: Medicare Other | Source: Ambulatory Visit | Attending: Radiation Oncology | Admitting: Radiation Oncology

## 2020-11-15 ENCOUNTER — Other Ambulatory Visit: Payer: Self-pay

## 2020-11-15 DIAGNOSIS — C8251 Diffuse follicle center lymphoma, lymph nodes of head, face, and neck: Secondary | ICD-10-CM | POA: Diagnosis not present

## 2020-11-19 ENCOUNTER — Ambulatory Visit
Admission: RE | Admit: 2020-11-19 | Discharge: 2020-11-19 | Disposition: A | Discharge status: Home / Self Care | Payer: Medicare Other | Source: Ambulatory Visit | Attending: Radiation Oncology | Admitting: Radiation Oncology

## 2020-11-19 ENCOUNTER — Other Ambulatory Visit: Payer: Self-pay

## 2020-11-19 DIAGNOSIS — C8251 Diffuse follicle center lymphoma, lymph nodes of head, face, and neck: Secondary | ICD-10-CM | POA: Diagnosis not present

## 2020-11-19 MED ORDER — SILVER SULFADIAZINE 1 % EX CREA: TOPICAL_CREAM | Freq: Once | CUTANEOUS | Status: AC

## 2020-11-20 ENCOUNTER — Ambulatory Visit
Admission: RE | Admit: 2020-11-20 | Discharge: 2020-11-20 | Disposition: A | Discharge status: Home / Self Care | Payer: Medicare Other | Source: Ambulatory Visit | Attending: Radiation Oncology | Admitting: Radiation Oncology

## 2020-11-20 DIAGNOSIS — C8251 Diffuse follicle center lymphoma, lymph nodes of head, face, and neck: Secondary | ICD-10-CM | POA: Diagnosis not present

## 2020-11-21 ENCOUNTER — Inpatient Hospital Stay: Payer: Medicare Other

## 2020-11-21 ENCOUNTER — Other Ambulatory Visit: Payer: Self-pay

## 2020-11-21 ENCOUNTER — Inpatient Hospital Stay: Payer: Medicare Other | Attending: Hematology and Oncology | Admitting: Hematology and Oncology

## 2020-11-21 ENCOUNTER — Ambulatory Visit
Admission: RE | Admit: 2020-11-21 | Discharge: 2020-11-21 | Disposition: A | Discharge status: Home / Self Care | Payer: Medicare Other | Source: Ambulatory Visit | Attending: Radiation Oncology | Admitting: Radiation Oncology

## 2020-11-21 ENCOUNTER — Other Ambulatory Visit: Payer: Self-pay | Admitting: Hematology and Oncology

## 2020-11-21 VITALS — BP 114/75 | HR 63 | Temp 98.5°F | Resp 20 | Wt 179.3 lb

## 2020-11-21 DIAGNOSIS — Z923 Personal history of irradiation: Secondary | ICD-10-CM | POA: Diagnosis not present

## 2020-11-21 DIAGNOSIS — M1990 Unspecified osteoarthritis, unspecified site: Secondary | ICD-10-CM | POA: Insufficient documentation

## 2020-11-21 DIAGNOSIS — K219 Gastro-esophageal reflux disease without esophagitis: Secondary | ICD-10-CM | POA: Diagnosis not present

## 2020-11-21 DIAGNOSIS — M129 Arthropathy, unspecified: Secondary | ICD-10-CM | POA: Insufficient documentation

## 2020-11-21 DIAGNOSIS — Z7982 Long term (current) use of aspirin: Secondary | ICD-10-CM | POA: Diagnosis not present

## 2020-11-21 DIAGNOSIS — C8251 Diffuse follicle center lymphoma, lymph nodes of head, face, and neck: Secondary | ICD-10-CM

## 2020-11-21 DIAGNOSIS — E785 Hyperlipidemia, unspecified: Secondary | ICD-10-CM | POA: Insufficient documentation

## 2020-11-21 DIAGNOSIS — M5481 Occipital neuralgia: Secondary | ICD-10-CM | POA: Insufficient documentation

## 2020-11-21 DIAGNOSIS — G40909 Epilepsy, unspecified, not intractable, without status epilepticus: Secondary | ICD-10-CM | POA: Diagnosis not present

## 2020-11-21 DIAGNOSIS — C8331 Diffuse large B-cell lymphoma, lymph nodes of head, face, and neck: Secondary | ICD-10-CM

## 2020-11-21 DIAGNOSIS — Z86010 Personal history of colonic polyps: Secondary | ICD-10-CM | POA: Insufficient documentation

## 2020-11-21 DIAGNOSIS — Z79899 Other long term (current) drug therapy: Secondary | ICD-10-CM | POA: Insufficient documentation

## 2020-11-21 DIAGNOSIS — D709 Neutropenia, unspecified: Secondary | ICD-10-CM | POA: Insufficient documentation

## 2020-11-21 DIAGNOSIS — I10 Essential (primary) hypertension: Secondary | ICD-10-CM | POA: Diagnosis not present

## 2020-11-21 DIAGNOSIS — C8511 Unspecified B-cell lymphoma, lymph nodes of head, face, and neck: Secondary | ICD-10-CM | POA: Diagnosis present

## 2020-11-21 DIAGNOSIS — Z95828 Presence of other vascular implants and grafts: Secondary | ICD-10-CM

## 2020-11-21 LAB — CMP (CANCER CENTER ONLY)
ALT: 14 U/L (ref 0–44)
AST: 17 U/L (ref 15–41)
Albumin: 3.6 g/dL (ref 3.5–5.0)
Alkaline Phosphatase: 140 U/L — ABNORMAL HIGH (ref 38–126)
Anion gap: 10 (ref 5–15)
BUN: 17 mg/dL (ref 8–23)
CO2: 26 mmol/L (ref 22–32)
Calcium: 9.2 mg/dL (ref 8.9–10.3)
Chloride: 104 mmol/L (ref 98–111)
Creatinine: 0.78 mg/dL (ref 0.61–1.24)
GFR, Estimated: >60 mL/min (ref >60)
Glucose, Bld: 96 mg/dL (ref 70–99)
Potassium: 4.4 mmol/L (ref 3.5–5.1)
Sodium: 140 mmol/L (ref 135–145)
Total Bilirubin: 0.3 mg/dL (ref 0.3–1.2)
Total Protein: 6.8 g/dL (ref 6.5–8.1)

## 2020-11-21 LAB — CBC WITH DIFFERENTIAL (CANCER CENTER ONLY)
Abs Immature Granulocytes: 0.01 10*3/uL (ref 0.00–0.07)
Basophils Absolute: 0 10*3/uL (ref 0.0–0.1)
Basophils Relative: 1 %
Eosinophils Absolute: 0.2 10*3/uL (ref 0.0–0.5)
Eosinophils Relative: 16 %
HCT: 31.8 % — ABNORMAL LOW (ref 39.0–52.0)
Hemoglobin: 11 g/dL — ABNORMAL LOW (ref 13.0–17.0)
Immature Granulocytes: 1 %
Lymphocytes Relative: 10 %
Lymphs Abs: 0.2 10*3/uL — ABNORMAL LOW (ref 0.7–4.0)
MCH: 33.2 pg (ref 26.0–34.0)
MCHC: 34.6 g/dL (ref 30.0–36.0)
MCV: 96.1 fL (ref 80.0–100.0)
Monocytes Absolute: 0.6 10*3/uL (ref 0.1–1.0)
Monocytes Relative: 36 %
Neutro Abs: 0.6 10*3/uL — ABNORMAL LOW (ref 1.7–7.7)
Neutrophils Relative %: 36 %
Platelet Count: 176 10*3/uL (ref 150–400)
RBC: 3.31 MIL/uL — ABNORMAL LOW (ref 4.22–5.81)
RDW: 12.3 % (ref 11.5–15.5)
WBC Count: 1.6 10*3/uL — ABNORMAL LOW (ref 4.0–10.5)
nRBC: 0 % (ref 0.0–0.2)

## 2020-11-21 LAB — LACTATE DEHYDROGENASE: LDH: 310 U/L — ABNORMAL HIGH (ref 98–192)

## 2020-11-21 MED ORDER — HEPARIN SOD (PORK) LOCK FLUSH 100 UNIT/ML IV SOLN
500.0000 [IU] | Freq: Once | INTRAVENOUS | Status: AC
  Administered 2020-11-21: 500 [IU]

## 2020-11-21 MED ORDER — SODIUM CHLORIDE 0.9% FLUSH
10.0000 mL | Freq: Once | INTRAVENOUS | Status: AC
  Administered 2020-11-21: 10 mL

## 2020-11-21 NOTE — Progress Notes (Signed)
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 Lorenso Courier  4) 02/22/2020: PET CT scan showed Deauville 4 and Deauville 5 adenopathy bilaterally in the neck. There is also a Deauville 4 left supraclavicular lymph node and a Deauville 4 right gastric lymph node 5) 04/24/2020: presents with rapid enlargement of his right submandibular lymph node.  6) 05/20/2020: partial excision of right submandibular lymph node with attempted fluid drainage. Pathology consistent with follicular lymphoma, no clear evidence of transformation.  7) 06/12/2020: Cycle 1 Day 1 of R-Benda. 8) 07/10/2020: Cycle 2 Day 1 of R-Benda. 9) 08/07/2020: Cycle 3 Day 1 of R-Benda. Noted to have enlargement of right cervical lymph node on exam.  10) 08/28/2020: Cycle 1 Day 1 of R-miniCHOP 11) 09/20/2020: Cycle 2 Day 2 of R-miniCHOP 12) 10/11/2020: Cycle 3 of R-miniCHOP HELD due to progression. 13) 10/30/2020: start of palliative radiation therapy  Interval History:  Brian Castillo 80 y.o. 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 The patient's last visit was on 8/25//2022. In the interim since the last visit he has continued palliative radiation therapy to the problematic lymph node.   On exam today Brian Castillo notes that radiation therapy is going well so far.  His pain is under much better control he is down to taking 1 Dilaudid at night in order to help  control the pain so that he can sleep.  He also notes that the gabapentin is working well for the occipital neuralgia.  He is having drainage from the large lymph node from several ulcers that have developed.  He has not changed the things about 4 times per day.  Otherwise he is not having any major side effects as result of the radiation.  His appetite is good and he has been gaining weight.  He reports that he is not having any issues with fevers, chills, sweats, nausea, vomiting or diarrhea.  A full 10 point ROS is listed below.  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;  Service: Orthopedics;  Laterality: Right;   CHOLECYSTECTOMY     COLONOSCOPY     IR IMAGING GUIDED PORT INSERTION  08/14/2020   IR RADIOLOGIST EVAL & MGMT  08/20/2020   KNEE ARTHROSCOPY  2003   bilateral  POLYPECTOMY      SOCIAL HISTORY: Social History   Socioeconomic History   Marital status: Married    Spouse name: Not on file   Number of children: Not on file   Years of education: Not on file   Highest education level: Not on file  Occupational History   Not on file  Tobacco Use   Smoking status: Former   Smokeless tobacco: Never   Tobacco comments:    quit 1984  Substance and Sexual Activity   Alcohol use: Yes    Alcohol/week: 6.0 standard drinks    Types: 6 Glasses of wine per week   Drug use: No    Sexual activity: Not on file  Other Topics Concern   Not on file  Social History Narrative   Not on file   Social Determinants of Health   Financial Resource Strain: Not on file  Food Insecurity: Not on file  Transportation Needs: Not on file  Physical Activity: Not on file  Stress: Not on file  Social Connections: Not on file  Intimate Partner Violence: Not on file    FAMILY HISTORY: Family History  Problem Relation Age of Onset   Colon cancer Neg Hx    Colon polyps Neg Hx     ALLERGIES:  is allergic to brimonidine tartrate-timolol, lisinopril, and netarsudil dimesylate.  MEDICATIONS:  Current Outpatient Medications  Medication Sig Dispense Refill   acyclovir (ZOVIRAX) 400 MG tablet Take 1 tablet (400 mg total) by mouth 2 (two) times daily. (Patient not taking: No sig reported) 60 tablet 5   allopurinol (ZYLOPRIM) 300 MG tablet Take 300 mg by mouth daily.     amLODipine (NORVASC) 5 MG tablet Take 5 mg by mouth Daily.      aspirin 81 MG tablet Take 81 mg by mouth daily.     Calcium Carb-Cholecalciferol (CALCIUM 600 + D PO) Take 1 tablet by mouth daily.     cholecalciferol (VITAMIN D) 1000 units tablet Take 1,000 Units by mouth daily.     cyclobenzaprine (FLEXERIL) 5 MG tablet Take 1 tablet (5 mg total) by mouth 3 (three) times daily as needed for muscle spasms. (Patient not taking: Reported on 11/05/2020) 30 tablet 0   gabapentin (NEURONTIN) 300 MG capsule Take 2 capsules (600 mg total) by mouth 3 (three) times daily. Take 1 capsule (300 mg total) by mouth twice a day and 2 capsules ('600mg'$ ) at bedtime 180 capsule 1   HYDROmorphone (DILAUDID) 2 MG tablet Take 1 tablet (2 mg total) by mouth every 4 (four) hours as needed for severe pain. 30 tablet 0   ibuprofen (ADVIL) 200 MG tablet Take 400 mg by mouth every 6 (six) hours as needed for mild pain.     Latanoprostene Bunod (VYZULTA) 0.024 % SOLN Place 1 drop into both eyes at bedtime.     lidocaine-prilocaine (EMLA) cream Apply 1  application topically as needed. (Patient taking differently: Apply 1 application topically daily as needed (port access).) 30 g 0   metoprolol succinate (TOPROL-XL) 25 MG 24 hr tablet Take 25 mg by mouth Daily.      oxyCODONE (OXY IR/ROXICODONE) 5 MG immediate release tablet Take 2-3 tablets (10-15 mg total) by mouth every 4 (four) hours as needed for severe pain. (Patient not taking: Reported on 11/05/2020) 120 tablet 0   phenytoin (DILANTIN) 100 MG ER capsule Take 200 mg by mouth 2 (two) times daily.     rosuvastatin (CRESTOR) 20 MG tablet Take 20 mg by mouth  daily.     senna-docusate (SENOKOT-S) 8.6-50 MG tablet Take 2 tablets by mouth at bedtime as needed for moderate constipation. (Patient not taking: Reported on 11/05/2020) 60 tablet 0   timolol (BETIMOL) 0.5 % ophthalmic solution Place 1 drop into both eyes 2 (two) times daily.     No current facility-administered medications for this visit.    REVIEW OF SYSTEMS:   Constitutional: ( - ) fevers, ( - )  chills , ( - ) night sweats Eyes: ( - ) blurriness of vision, ( - ) double vision, ( - ) watery eyes Ears, nose, mouth, throat, and face: ( - ) mucositis, ( - ) sore throat Respiratory: ( - ) cough, ( - ) dyspnea, ( - ) wheezes Cardiovascular: ( - ) palpitation, ( - ) chest discomfort, ( - ) lower extremity swelling Gastrointestinal:  ( - ) nausea, ( - ) heartburn, ( - ) change in bowel habits Skin: ( - ) abnormal skin rashes Lymphatics: ( - ) new lymphadenopathy, ( - ) easy bruising Neurological: ( - ) numbness, ( - ) tingling, ( - ) new weaknesses Behavioral/Psych: ( - ) mood change, ( - ) new changes  All other systems were reviewed with the patient and are negative.  PHYSICAL EXAMINATION: ECOG PERFORMANCE STATUS: 1 - Symptomatic but completely ambulatory  Vitals:   11/21/20 1140  BP: 114/75  Pulse: 63  Resp: 20  Temp: 98.5 F (36.9 C)  SpO2: 98%    Filed Weights   11/21/20 1140  Weight: 179 lb 4.8 oz (81.3 kg)      GENERAL: well appearing elderly Caucasian male in NAD  SKIN: skin color, texture, turgor are normal, no rashes or significant lesions EYES: conjunctiva are pink and non-injected, sclera clear NECK: supple, non-tender LYMPH:  Single palpable right sided submandibular lymph node. Size is steady, though 3 ulcers have developed that drain. Otherwise no other marked palpable lymphadenopathy in the cervical, axillary or supraclavicular lymph nodes.  LUNGS: clear to auscultation and percussion with normal breathing effort HEART: regular rate & rhythm and no murmurs and no lower extremity edema Musculoskeletal: no cyanosis of digits and no clubbing  PSYCH: alert & oriented x 3, fluent speech NEURO: no focal motor/sensory deficits  LABORATORY DATA:  I have reviewed the data as listed CBC Latest Ref Rng & Units 11/21/2020 11/07/2020 10/25/2020  WBC 4.0 - 10.5 K/uL 1.6(L) 4.8 5.2  Hemoglobin 13.0 - 17.0 g/dL 11.0(L) 11.5(L) 10.9(L)  Hematocrit 39.0 - 52.0 % 31.8(L) 32.9(L) 31.3(L)  Platelets 150 - 400 K/uL 176 184 154    CMP Latest Ref Rng & Units 11/21/2020 11/07/2020 10/25/2020  Glucose 70 - 99 mg/dL 96 102(H) 107(H)  BUN 8 - 23 mg/dL '17 16 19  '$ Creatinine 0.61 - 1.24 mg/dL 0.78 0.80 0.81  Sodium 135 - 145 mmol/L 140 139 139  Potassium 3.5 - 5.1 mmol/L 4.4 4.1 4.1  Chloride 98 - 111 mmol/L 104 102 103  CO2 22 - 32 mmol/L '26 29 27  '$ Calcium 8.9 - 10.3 mg/dL 9.2 9.6 9.2  Total Protein 6.5 - 8.1 g/dL 6.8 7.1 6.9  Total Bilirubin 0.3 - 1.2 mg/dL 0.3 0.3 0.3  Alkaline Phos 38 - 126 U/L 140(H) 128(H) 131(H)  AST 15 - 41 U/L '17 17 15  '$ ALT 0 - 44 U/L '14 15 12   '$ RADIOGRAPHIC STUDIES: I have personally reviewed the radiological images as listed and agreed with the findings in the report: stable lymphadenopathy with exception of  the rapidly enlarging submandibular lymph node.   No results found.  ASSESSMENT & PLAN Brian Castillo 80 y.o. male with medical history significant for Stage III  follicular lymphoma presents for a follow up visit. The patient's last visit was on 02/12/2020 at which time he establish care.  After review the labs, the records, discussion with the patient the findings are most consistent with a stage III follicular lymphoma based on the PET CT scan.  There are lymph nodes on both sides of diaphragm and therefore stage III would be the most appropriate designation.    Previously we discussed the nature of follicular lymphoma and the treatment options moving forward.  The options at this time would be observation, immunotherapy, or combination of chemotherapy and immunotherapy.  Given the lack of symptoms, cytopenias, and bulky disease at the time of presentation we decided to proceed with observation alone. Unfortunately due to rapid progression we need to consider treatment moving forward.  Additionally we discussed rituximab monotherapy with weekly dosing x4 doses followed by every 2 months dosing x4 as a treatment option as well as bendamustine and rituximab on days 1 and 2 of a 28-day cycle x6.  We discussed the side effects, risks and benefits, and possible outcomes of treatment.  The patient voiced his understanding of these options and wished to proceed with R-Benda.   Over the period of one month the patient was found to have a rapidly enlarging right submandibular lymph node.  Due to concern for this enlarging lymph node we ordered a CT scan of the chest abdomen pelvis to assess for progression elsewhere, but that was the only lymph node that was progressing. 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   On PET CT scan from 08/23/2020 the patient showed complete response of disease with every site with the exception of his right cervical lymph node which is increasing in size and brightness.  As such we have to discontinue first-line therapy with rituximab and Bendamustine and transition over to second line R mini CHOP.  R  mini CHOP chemotherapy Cycle 1 Day1 started on 08/28/2020.  Unfortunately when he came time to proceed to cycle 3 it was evident that the lymph node in the neck had started to grow again rapidly.  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  We are currently planning for 2nd line therapy with R-miniCHOP(which would also be effective if patient is having transformation of the tumor).  -- patient completed PET, Port, and TTE prior to start of R-miniCHOP. --prior PET CT findings are consistent with a Stage III follicular lymphoma. Due to rapidly enlarging submandibular lymph node we proceeded with treatment.  --imaging shows stable disease save for a single enlarging submandibular lymph node.  Prior biopsy has shown transformation into a more aggressive lymphoma.  --started Cycle 1 Day 1 of R-Benda chemotherapy on 06/12/2020 --completed Cycle 3 Day of R-Benda. Unfortunately he had progression for which we changed his therapy to R-miniCHOP.  --08/28/2020 was Day 1 of Cycle 1 of R-miniCHOP. Prior to start of Cycle 3 Day 1 he was noted to have increased size of the lymph node.  Plan: -- ENT notes lesion is too large for excisional biopsy at this time --patient is undergoing palliative radiation with Dr. Isidore Moos --The next chemotherapy line would be gemcitabine and oxaliplatin.  My preference would  be to complete palliative radiation oncology before starting this regimen. --RTC in 2 weeks for continue monitoring on radiation.   #Neutropenia, acute --unclear if this represents a lab error or genuine drop in [facility] --patient no currently on chemotherapy, unclear what provoked this.  --gave patient neutropenic precautions --will recheck labs in 1 -2 weeks to assess for resolution.   #Pain Control, improved --patient is having severe pain in his midline neck up to his scalp --extensive  workup including CT/MRI scans of head/neck and LP have revealed no clear etiology for his findings --pain is responding well to gabapentin 300 BID with flexeril and dilaudid at night.  --oxycodone to 15 mg PO q 4H PRN with ibuprofen '600mg'$  Q6H PRN. (Not currently taking this) --Dilaudid 2 mg p.o. at night.  --continue flexeril '5mg'$  q8H PRN. (Not taking) --Continue to monitor  #Medication Interaction --Allopurinol increases the levels of Dilantin. --We will work with the patient's primary care provider Dr. Shon Baton to monitor his Dilantin levels and adjust accordingly. For now we are following this weekly.  #Fever, resolved --Single episode of fever up to 101 F occurred on 06/25/2020. --Patient was not neutropenic at that time and was evaluated emergency department with infectious work-up including chest x-ray, blood cultures, and urine culture.  Data at that time showed no evidence of infectious disease. --No further fevers noted since that time.  We will continue to monitor.  #Supportive Care --chemotherapy education completed --zofran '8mg'$  q8H PRN and compazine '10mg'$  PO q6H for nausea --acyclovir '400mg'$  PO BID for VCZ prophylaxis -- allopurinol '300mg'$  PO daily for TLS prophylaxis -- port in place -- no pain medication required at this time.   No orders of the defined types were placed in this encounter.  All questions were answered. The patient knows to call the clinic with any problems, questions or concerns.  A total of more than 30 minutes were spent on this encounter and over half of that time was spent on counseling and coordination of care as outlined above.   Ledell Peoples, MD Department of Hematology/Oncology [address] [facility] Phone: [phone] Pager: [phone] Email: [email or web address]  11/21/2020 3:53 PM

## 2020-11-22 ENCOUNTER — Telehealth: Payer: Self-pay | Admitting: *Deleted

## 2020-11-22 ENCOUNTER — Ambulatory Visit
Admission: RE | Admit: 2020-11-22 | Discharge: 2020-11-22 | Disposition: A | Discharge status: Home / Self Care | Payer: Medicare Other | Source: Ambulatory Visit | Attending: Radiation Oncology | Admitting: Radiation Oncology

## 2020-11-22 DIAGNOSIS — C8251 Diffuse follicle center lymphoma, lymph nodes of head, face, and neck: Secondary | ICD-10-CM | POA: Diagnosis not present

## 2020-11-22 LAB — PHENYTOIN LEVEL, FREE AND TOTAL
Phenytoin, Free: 1.2 ug/mL (ref 1.0–2.0)
Phenytoin, Total: 14 ug/mL (ref 10.0–20.0)

## 2020-11-22 NOTE — Telephone Encounter (Signed)
[name]  patient in response to on call message that he had a fever last night and was found to have a WBC of 1.6 Spoke with pt and he stated his Tmax was 99.3 and he was essentially asymptomatic. He does has open ulcerations on the enlarged lymph in his right neck-he is getting radiation to that area.  This area has drainage baseline-at times blood tinged but normally yellowish in color. This has not changed. Pt states he has not had a fever today and feels at his baseline.  Advised to call with temp >100.4, any new s/s of infection. Advised on neutrapenic precautions.. He voiced understanding.  He also asked if he could receive flu vaccine and the new covid bivalent booster. Dr. Lorenso Courier stated it was ok to do this. Pt made aware of that. Pt voiced understanding of all of the above.

## 2020-11-25 ENCOUNTER — Other Ambulatory Visit: Payer: Self-pay

## 2020-11-25 ENCOUNTER — Ambulatory Visit
Admission: RE | Admit: 2020-11-25 | Discharge: 2020-11-25 | Disposition: A | Discharge status: Home / Self Care | Payer: Medicare Other | Source: Ambulatory Visit | Attending: Radiation Oncology | Admitting: Radiation Oncology

## 2020-11-25 DIAGNOSIS — C8251 Diffuse follicle center lymphoma, lymph nodes of head, face, and neck: Secondary | ICD-10-CM | POA: Diagnosis not present

## 2020-11-26 ENCOUNTER — Ambulatory Visit
Admission: RE | Admit: 2020-11-26 | Discharge: 2020-11-26 | Disposition: A | Discharge status: Home / Self Care | Payer: Medicare Other | Source: Ambulatory Visit | Attending: Radiation Oncology | Admitting: Radiation Oncology

## 2020-11-26 DIAGNOSIS — C8251 Diffuse follicle center lymphoma, lymph nodes of head, face, and neck: Secondary | ICD-10-CM | POA: Diagnosis not present

## 2020-11-27 ENCOUNTER — Other Ambulatory Visit: Payer: Self-pay

## 2020-11-27 ENCOUNTER — Ambulatory Visit
Admission: RE | Admit: 2020-11-27 | Discharge: 2020-11-27 | Disposition: A | Discharge status: Home / Self Care | Payer: Medicare Other | Source: Ambulatory Visit | Attending: Radiation Oncology | Admitting: Radiation Oncology

## 2020-11-27 DIAGNOSIS — C8251 Diffuse follicle center lymphoma, lymph nodes of head, face, and neck: Secondary | ICD-10-CM | POA: Diagnosis not present

## 2020-11-28 ENCOUNTER — Ambulatory Visit
Admission: RE | Admit: 2020-11-28 | Discharge: 2020-11-28 | Disposition: A | Discharge status: Home / Self Care | Payer: Medicare Other | Source: Ambulatory Visit | Attending: Radiation Oncology | Admitting: Radiation Oncology

## 2020-11-28 DIAGNOSIS — C8251 Diffuse follicle center lymphoma, lymph nodes of head, face, and neck: Secondary | ICD-10-CM | POA: Diagnosis not present

## 2020-11-29 ENCOUNTER — Ambulatory Visit
Admission: RE | Admit: 2020-11-29 | Discharge: 2020-11-29 | Disposition: A | Discharge status: Home / Self Care | Payer: Medicare Other | Source: Ambulatory Visit | Attending: Radiation Oncology | Admitting: Radiation Oncology

## 2020-11-29 ENCOUNTER — Other Ambulatory Visit: Payer: Self-pay

## 2020-11-29 DIAGNOSIS — C8251 Diffuse follicle center lymphoma, lymph nodes of head, face, and neck: Secondary | ICD-10-CM | POA: Diagnosis not present

## 2020-12-02 ENCOUNTER — Ambulatory Visit
Admission: RE | Admit: 2020-12-02 | Discharge: 2020-12-02 | Disposition: A | Discharge status: Home / Self Care | Payer: Medicare Other | Source: Ambulatory Visit | Attending: Radiation Oncology | Admitting: Radiation Oncology

## 2020-12-02 ENCOUNTER — Other Ambulatory Visit: Payer: Self-pay

## 2020-12-02 DIAGNOSIS — C8251 Diffuse follicle center lymphoma, lymph nodes of head, face, and neck: Secondary | ICD-10-CM | POA: Diagnosis not present

## 2020-12-03 ENCOUNTER — Ambulatory Visit
Admission: RE | Admit: 2020-12-03 | Discharge: 2020-12-03 | Disposition: A | Discharge status: Home / Self Care | Payer: Medicare Other | Source: Ambulatory Visit | Attending: Radiation Oncology | Admitting: Radiation Oncology

## 2020-12-03 DIAGNOSIS — C8251 Diffuse follicle center lymphoma, lymph nodes of head, face, and neck: Secondary | ICD-10-CM | POA: Diagnosis not present

## 2020-12-04 ENCOUNTER — Telehealth: Payer: Self-pay | Admitting: *Deleted

## 2020-12-04 ENCOUNTER — Ambulatory Visit: Payer: Medicare Other

## 2020-12-04 ENCOUNTER — Other Ambulatory Visit: Payer: Self-pay

## 2020-12-04 ENCOUNTER — Ambulatory Visit
Admission: RE | Admit: 2020-12-04 | Discharge: 2020-12-04 | Disposition: A | Discharge status: Home / Self Care | Payer: Medicare Other | Source: Ambulatory Visit | Attending: Radiation Oncology | Admitting: Radiation Oncology

## 2020-12-04 ENCOUNTER — Encounter: Payer: Self-pay | Admitting: Radiation Oncology

## 2020-12-04 ENCOUNTER — Encounter (HOSPITAL_COMMUNITY): Payer: Self-pay | Admitting: Radiology

## 2020-12-04 DIAGNOSIS — C8251 Diffuse follicle center lymphoma, lymph nodes of head, face, and neck: Secondary | ICD-10-CM

## 2020-12-04 NOTE — Telephone Encounter (Signed)
Received call from pt stating he saw Dr. Lanell Persons today.  He states he has developed 2  "Lumps" on his right cheek, just below his cheekbone. This the same side that he is receiving radiation treatments to (right neck) but outside the zone of his radiation. He states he has fever today of 100.1, c/o increasing fatigue and sore throat as he has gone through radiation. He states that Dr. Isidore Moos is going to hold the next 5 treatments and arrange to have the 2 new areas biopsied. Pt is asking if he should go to the ED for the fever. Advised that he does not need to do that. He is scheduled to see [place] tomoorow @ 1:45

## 2020-12-04 NOTE — Telephone Encounter (Signed)
Pt voiced understanding. Dr. Donalda Ewings aware of the situation from Dr. Lanell Persons

## 2020-12-04 NOTE — Progress Notes (Signed)
Patient Name  [name], [name] Legal Sex  Male DOB  Dec 28, 1940 SSN  [id number] Address  [place] [address] Phone  [phone] (Home) *Preferred*  [phone] (Mobile)    RE: CT Biopsy Received: Today Suttle, Rosanne Ashing, MD  Eppie Gibson, MD; Garth Bigness D Approved for ultrasound guided right partoid/neck nodule biopsy.  Images from dosimetry CT reviewed as per discussion with Dr. Isidore Moos below.     Dylan        Previous Messages   ----- Message -----  From: Eppie Gibson, MD  Sent: 12/04/2020   3:27 PM EDT  To: Suzette Battiest, MD, Jillyn Hidden  Subject: RE: CT Biopsy                                   It's palpable now and it arose since previous imaging in radiology.  I can send Dr. Serafina Royals a snapshot by email now, depicting the nodule in the right parotid (as it appears) from rad/onc imaging. Thanks!    ----- Message -----  From: Garth Bigness D  Sent: 12/04/2020   1:51 PM EDT  To: Eppie Gibson, MD  Subject: FW: CT Biopsy                                   Please see message below from Dr Serafina Royals.  ----- Message -----  From: Suzette Battiest, MD  Sent: 12/04/2020   1:33 PM EDT  To: Jillyn Hidden  Subject: RE: CT Biopsy                                   I am unable to see any new subcutaneous nodules on recent imaging.     Dylan  ----- Message -----  From: Garth Bigness D  Sent: 12/04/2020  12:32 PM EDT  To: Ir Procedure Requests  Subject: CT Biopsy                                       Procedure:  CT Biopsy   Reason:  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,  Dr. Eppie Gibson would like an image guided biopsy of SQ nodules superior to the dominant right neck node to r/o Lymphoma. He was getting radiation to the dominant node but radiation will be paused until a biopsy is completed of the new area and then resumed once biopsy results are in.   History:  NM PEt, CT, MR in computer   Provider:  Eppie Gibson   Provider Contact:  [phone]

## 2020-12-05 ENCOUNTER — Inpatient Hospital Stay: Payer: Medicare Other

## 2020-12-05 ENCOUNTER — Emergency Department (HOSPITAL_COMMUNITY)
Admission: EM | Admit: 2020-12-05 | Discharge: 2020-12-05 | Disposition: A | Discharge status: Home / Self Care | Payer: Medicare Other | Attending: Emergency Medicine | Admitting: Emergency Medicine

## 2020-12-05 ENCOUNTER — Encounter (HOSPITAL_COMMUNITY): Payer: Self-pay | Admitting: Oncology

## 2020-12-05 ENCOUNTER — Emergency Department (HOSPITAL_COMMUNITY): Payer: Medicare Other

## 2020-12-05 ENCOUNTER — Other Ambulatory Visit: Payer: Self-pay | Admitting: Physician Assistant

## 2020-12-05 ENCOUNTER — Other Ambulatory Visit: Payer: Self-pay

## 2020-12-05 ENCOUNTER — Inpatient Hospital Stay: Payer: Medicare Other | Admitting: Hematology and Oncology

## 2020-12-05 ENCOUNTER — Ambulatory Visit: Payer: Medicare Other

## 2020-12-05 VITALS — BP 133/66 | HR 72 | Temp 97.7°F | Resp 18 | Ht 68.0 in | Wt 172.9 lb

## 2020-12-05 DIAGNOSIS — Z859 Personal history of malignant neoplasm, unspecified: Secondary | ICD-10-CM | POA: Diagnosis not present

## 2020-12-05 DIAGNOSIS — Z7982 Long term (current) use of aspirin: Secondary | ICD-10-CM | POA: Diagnosis not present

## 2020-12-05 DIAGNOSIS — S80212A Abrasion, left knee, initial encounter: Secondary | ICD-10-CM | POA: Diagnosis not present

## 2020-12-05 DIAGNOSIS — C8331 Diffuse large B-cell lymphoma, lymph nodes of head, face, and neck: Secondary | ICD-10-CM

## 2020-12-05 DIAGNOSIS — Z79899 Other long term (current) drug therapy: Secondary | ICD-10-CM | POA: Diagnosis not present

## 2020-12-05 DIAGNOSIS — W19XXXA Unspecified fall, initial encounter: Secondary | ICD-10-CM | POA: Insufficient documentation

## 2020-12-05 DIAGNOSIS — C8251 Diffuse follicle center lymphoma, lymph nodes of head, face, and neck: Secondary | ICD-10-CM | POA: Diagnosis not present

## 2020-12-05 DIAGNOSIS — S60512A Abrasion of left hand, initial encounter: Secondary | ICD-10-CM | POA: Insufficient documentation

## 2020-12-05 DIAGNOSIS — S0081XA Abrasion of other part of head, initial encounter: Secondary | ICD-10-CM | POA: Diagnosis not present

## 2020-12-05 DIAGNOSIS — Z87891 Personal history of nicotine dependence: Secondary | ICD-10-CM | POA: Diagnosis not present

## 2020-12-05 DIAGNOSIS — S0993XA Unspecified injury of face, initial encounter: Secondary | ICD-10-CM

## 2020-12-05 DIAGNOSIS — Z95828 Presence of other vascular implants and grafts: Secondary | ICD-10-CM

## 2020-12-05 DIAGNOSIS — S0992XA Unspecified injury of nose, initial encounter: Secondary | ICD-10-CM | POA: Diagnosis present

## 2020-12-05 DIAGNOSIS — I10 Essential (primary) hypertension: Secondary | ICD-10-CM | POA: Diagnosis not present

## 2020-12-05 DIAGNOSIS — Y92239 Unspecified place in hospital as the place of occurrence of the external cause: Secondary | ICD-10-CM | POA: Diagnosis not present

## 2020-12-05 DIAGNOSIS — S022XXA Fracture of nasal bones, initial encounter for closed fracture: Secondary | ICD-10-CM | POA: Diagnosis not present

## 2020-12-05 LAB — CMP (CANCER CENTER ONLY)
ALT: 21 U/L (ref 0–44)
AST: 20 U/L (ref 15–41)
Albumin: 3.7 g/dL (ref 3.5–5.0)
Alkaline Phosphatase: 128 U/L — ABNORMAL HIGH (ref 38–126)
Anion gap: 10 (ref 5–15)
BUN: 16 mg/dL (ref 8–23)
CO2: 26 mmol/L (ref 22–32)
Calcium: 9.5 mg/dL (ref 8.9–10.3)
Chloride: 101 mmol/L (ref 98–111)
Creatinine: 0.82 mg/dL (ref 0.61–1.24)
GFR, Estimated: >60 mL/min (ref >60)
Glucose, Bld: 102 mg/dL — ABNORMAL HIGH (ref 70–99)
Potassium: 4.2 mmol/L (ref 3.5–5.1)
Sodium: 137 mmol/L (ref 135–145)
Total Bilirubin: 0.3 mg/dL (ref 0.3–1.2)
Total Protein: 7.5 g/dL (ref 6.5–8.1)

## 2020-12-05 LAB — CBC WITH DIFFERENTIAL (CANCER CENTER ONLY)
Abs Immature Granulocytes: 0.01 10*3/uL (ref 0.00–0.07)
Basophils Absolute: 0 10*3/uL (ref 0.0–0.1)
Basophils Relative: 2 %
Eosinophils Absolute: 0.2 10*3/uL (ref 0.0–0.5)
Eosinophils Relative: 11 %
HCT: 35.2 % — ABNORMAL LOW (ref 39.0–52.0)
Hemoglobin: 12.1 g/dL — ABNORMAL LOW (ref 13.0–17.0)
Immature Granulocytes: 1 %
Lymphocytes Relative: 7 %
Lymphs Abs: 0.1 10*3/uL — ABNORMAL LOW (ref 0.7–4.0)
MCH: 32.6 pg (ref 26.0–34.0)
MCHC: 34.4 g/dL (ref 30.0–36.0)
MCV: 94.9 fL (ref 80.0–100.0)
Monocytes Absolute: 0.6 10*3/uL (ref 0.1–1.0)
Monocytes Relative: 34 %
Neutro Abs: 0.7 10*3/uL — ABNORMAL LOW (ref 1.7–7.7)
Neutrophils Relative %: 45 %
Platelet Count: 173 10*3/uL (ref 150–400)
RBC: 3.71 MIL/uL — ABNORMAL LOW (ref 4.22–5.81)
RDW: 12.3 % (ref 11.5–15.5)
WBC Count: 1.6 10*3/uL — ABNORMAL LOW (ref 4.0–10.5)
nRBC: 0 % (ref 0.0–0.2)

## 2020-12-05 LAB — LACTATE DEHYDROGENASE: LDH: 325 U/L — ABNORMAL HIGH (ref 98–192)

## 2020-12-05 MED ORDER — BACITRACIN ZINC 500 UNIT/GM EX OINT
TOPICAL_OINTMENT | Freq: Two times a day (BID) | CUTANEOUS | Status: DC
  Filled 2020-12-05: qty 1.8

## 2020-12-05 MED ORDER — LIDOCAINE VISCOUS HCL 2 % MT SOLN: 15.0000 mL | OROMUCOSAL | 1 refills | Status: DC | PRN

## 2020-12-05 MED ORDER — HYDROMORPHONE HCL 2 MG PO TABS: 2.0000 mg | ORAL_TABLET | ORAL | 0 refills | Status: DC | PRN

## 2020-12-05 NOTE — Discharge Instructions (Addendum)
Your CT scans were reassuring today besides a fracture of your nasal bones. These will heal on their own. I would recommend applying ice to your face to help with pain. Take Tylenol as needed for pain.   Apply bacitracin (neosporin) to your wounds to help with healing.   Please follow up with your PCP regarding ED visit today.  Return to the ED for any new/worsening symptoms

## 2020-12-05 NOTE — ED Provider Notes (Signed)
[facility] DEPT Provider Note   CSN: [id number] Arrival date & time: 12/05/20  1506     History Chief Complaint  Patient presents with   Fall    Brian Castillo is a 80 y.o. 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  Patient was at cancer center when he tripped on a curb causing him to fall forward and hit his face/forehead on the concrete.  No loss of consciousness and is not anticoagulated.  He states he tried to catch himself with his outstretched left hand and the left knee and has sustained abrasions to both.  Patient also has abrasions to his face.  He states he is up-to-date on tetanus.  He has no other complaints at this time.  Wife states he is acting at baseline.  Denies any vision changes, nausea, vomiting.   The history is provided by the patient, the spouse and medical records.      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   HTN (hypertension) 09/13/2020   Port-A-Cath in place 30/86/5784   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 ([place]) 05/29/2020   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/24/2014    Past Surgical History:  Procedure Laterality Date   CARPOMETACARPEL SUSPENSION PLASTY Right 08/24/2014    Procedure: RIGHT CMC ARTHOPLASTY WITH DOUBLE TENDON TRANSFER AND REPAIR  RECONSTRUCTION ;  Surgeon: Roseanne Kaufman, MD;  Location: [facility];  Service: Orthopedics;  Laterality: Right;   CHOLECYSTECTOMY     COLONOSCOPY     IR IMAGING GUIDED PORT INSERTION  08/14/2020   IR RADIOLOGIST EVAL & MGMT  08/20/2020   KNEE ARTHROSCOPY  2003   bilateral   POLYPECTOMY         Family History  Problem Relation Age of Onset   Colon cancer Neg Hx    Colon polyps Neg Hx     Social History   Tobacco Use   Smoking status: Former   Smokeless tobacco: Never   Tobacco comments:    quit 1984  Substance Use Topics   Alcohol use: Yes    Alcohol/week: 6.0 standard drinks    Types: 6 Glasses of wine per week   Drug use: No    Home Medications Prior to Admission medications   Medication Sig Start Date End Date Taking? Authorizing Provider  acyclovir (ZOVIRAX) 400 MG tablet Take 1 tablet (400 mg total) by mouth 2 (two) times daily. Patient not taking: No sig reported 05/29/20   Orson Slick, MD  allopurinol (ZYLOPRIM) 300 MG tablet Take 300 mg by mouth daily.    [provider]  amLODipine (  NORVASC) 5 MG tablet Take 5 mg by mouth Daily.  11/28/11   [provider]  aspirin 81 MG tablet Take 81 mg by mouth daily.    [provider]  Calcium Carb-Cholecalciferol (CALCIUM 600 + D PO) Take 1 tablet by mouth daily.    [provider]  cholecalciferol (VITAMIN D) 1000 units tablet Take 1,000 Units by mouth daily.    [provider]  cyclobenzaprine (FLEXERIL) 5 MG tablet Take 1 tablet (5 mg total) by mouth 3 (three) times daily as needed for muscle spasms. Patient not taking: Reported on 11/05/2020 09/09/20   Orson Slick, MD  gabapentin (NEURONTIN) 300 MG capsule Take 2 capsules (600 mg total) by mouth 3 (three) times daily. Take 1 capsule (300 mg total) by mouth twice a day and 2 capsules (600mg ) at bedtime 11/05/20   Vaslow, Acey Lav, MD   HYDROmorphone (DILAUDID) 2 MG tablet Take 1 tablet (2 mg total) by mouth every 4 (four) hours as needed for severe pain. 12/05/20   Orson Slick, MD  ibuprofen (ADVIL) 200 MG tablet Take 400 mg by mouth every 6 (six) hours as needed for mild pain.    [provider]  Latanoprostene Bunod (VYZULTA) 0.024 % SOLN Place 1 drop into both eyes at bedtime.    [provider]  lidocaine (XYLOCAINE) 2 % solution Use as directed 15 mLs in the mouth or throat every 3 (three) hours as needed for mouth pain. Swish, gargle and spit out every 3 hours as needed for mouth/throat  pain 12/05/20   Orson Slick, MD  lidocaine-prilocaine (EMLA) cream Apply 1 application topically as needed. Patient taking differently: Apply 1 application topically daily as needed (port access). 08/28/20   Orson Slick, MD  metoprolol succinate (TOPROL-XL) 25 MG 24 hr tablet Take 25 mg by mouth Daily.  11/28/11   [provider]  oxyCODONE (OXY IR/ROXICODONE) 5 MG immediate release tablet Take 2-3 tablets (10-15 mg total) by mouth every 4 (four) hours as needed for severe pain. Patient not taking: Reported on 11/05/2020 10/24/20   Orson Slick, MD  phenytoin (DILANTIN) 100 MG ER capsule Take 200 mg by mouth 2 (two) times daily.    [provider]  rosuvastatin (CRESTOR) 20 MG tablet Take 20 mg by mouth daily.    [provider]  senna-docusate (SENOKOT-S) 8.6-50 MG tablet Take 2 tablets by mouth at bedtime as needed for moderate constipation. Patient not taking: Reported on 11/05/2020 09/16/20   Damita Lack, MD  timolol (BETIMOL) 0.5 % ophthalmic solution Place 1 drop into both eyes 2 (two) times daily.    [provider]    Allergies    Brimonidine tartrate-timolol, Lisinopril, and Netarsudil dimesylate  Review of Systems   Review of Systems  Constitutional:  Negative for chills and fever.  Eyes:  Negative for visual disturbance.  Gastrointestinal:  Negative  for nausea and vomiting.  Musculoskeletal:  Positive for arthralgias.  Skin:  Positive for wound.  Neurological:  Positive for headaches. Negative for syncope.  All other systems reviewed and are negative.  Physical Exam Updated Vital Signs BP (!) 145/72 (BP Location: Right Arm)   Pulse 74   Resp 18   Ht 5\' 8"  (1.727 m)   Wt 78 kg   SpO2 98%   BMI 26.15 kg/m   Physical Exam Vitals and nursing note reviewed.  Constitutional:      Appearance: He is  not ill-appearing.  HENT:     Head: Normocephalic.     Comments: Abrasions noted to L forehead and nose with TTP.  No raccoon's sign or battle's sign. No malocclusion.  Eyes:     Extraocular Movements: Extraocular movements intact.     Conjunctiva/sclera: Conjunctivae normal.     Pupils: Pupils are equal, round, and reactive to light.  Neck:     Comments: No specific midline TTP Cardiovascular:     Rate and Rhythm: Normal rate and regular rhythm.     Pulses: Normal pulses.  Pulmonary:     Effort: Pulmonary effort is normal.     Breath sounds: Normal breath sounds. No wheezing, rhonchi or rales.  Abdominal:     Palpations: Abdomen is soft.     Tenderness: There is no abdominal tenderness.  Musculoskeletal:     Cervical back: Neck supple. No tenderness.     Comments: Abrasions noted to L knee and L radial wrist/L thumb. ROM intact to both. Strength and sensation intact. 2+ radial pulse and PT pulse on left side.   Skin:    General: Skin is warm and dry.  Neurological:     Mental Status: He is alert.     Comments: Alert and oriented to self, place, time and event.   Speech is fluent, clear without dysarthria or dysphasia.   Strength 5/5 in upper/lower extremities   Sensation intact in upper/lower extremities   Normal gait.  Negative Romberg. No pronator drift.  Normal finger-to-nose and feet tapping.  CN I not tested  CN II grossly intact visual fields bilaterally. Did not visualize posterior eye.  CN III, IV, VI  PERRLA and EOMs intact bilaterally  CN V Intact sensation to sharp and light touch to the face  CN VII facial movements symmetric  CN VIII not tested  CN IX, X no uvula deviation, symmetric rise of soft palate  CN XI 5/5 SCM and trapezius strength bilaterally  CN XII Midline tongue protrusion, symmetric L/R movements      ED Results / Procedures / Treatments   Labs (all labs ordered are listed, but only abnormal results are displayed) Labs Reviewed - No data to display  EKG None  Radiology DG Wrist Complete Left  Result Date: 12/05/2020 CLINICAL DATA:  Fall. EXAM: LEFT HAND - COMPLETE 3+ VIEW; LEFT WRIST - COMPLETE 3+ VIEW COMPARISON:  None. FINDINGS: No acute fracture or dislocation. Prior trapeziiectomy. Mild osteoarthritis of the DIP joints and thumb IP joint. Bone mineralization is normal. Soft tissues are unremarkable. IMPRESSION: 1. No acute osseous abnormality. Electronically Signed   By: Titus Dubin M.D.   On: 12/05/2020 18:19   CT Head Wo Contrast  Result Date: 12/05/2020 CLINICAL DATA:  Recent fall with left facial trauma and pain, initial encounter EXAM: CT HEAD WITHOUT CONTRAST CT MAXILLOFACIAL WITHOUT CONTRAST CT CERVICAL SPINE WITHOUT CONTRAST TECHNIQUE: Multidetector CT imaging of the head, cervical spine, and maxillofacial structures were performed using the standard protocol without intravenous contrast. Multiplanar CT image reconstructions of the cervical spine and maxillofacial structures were also generated. COMPARISON:  09/11/2020 FINDINGS: CT HEAD FINDINGS Brain: No evidence of acute infarction, hemorrhage, hydrocephalus, extra-axial collection or mass lesion/mass effect. Mild atrophic changes are noted. Vascular: No hyperdense vessel or unexpected calcification. Skull: Normal. Negative for fracture or focal lesion. Other: None. CT MAXILLOFACIAL FINDINGS Osseous: Mildly downward displaced distal nasal bone fracture is noted best seen on image number 48 of series 9.  This is noted centrally at the bridge  of the nose. No other fractures are seen. No other bony abnormality is noted. Orbits: The orbits and their contents are within normal limits. No muscular abnormality is noted. Sinuses: Paranasal sinuses are well aerated. No mucosal abnormality is seen. Soft tissues: Surrounding soft tissue structures again show an enlarged soft tissue mass lesion similar to that seen on prior CT of the neck. It demonstrates changes of central necrosis as well as some air within which may be related to recent biopsy. Clinical correlation is recommended. This lesion now measures 6.3 x 4.8 cm which is increased from the prior exam at which time it measured 4.9 x 3.3 cm. Some surrounding soft tissue edema is noted. Some extension along the masseter muscle on the right is noted she was not appreciated on the prior exam. To soft tissue foci are seen. One of these measures approximately 15 mm and the second approximally 18 mm. Mild soft tissue swelling in the forehead is noted. CT CERVICAL SPINE FINDINGS Alignment: Within normal limits. Skull base and vertebrae: Cervical segments are well visualized. Vertebral body height is well maintained. Disc space narrowing is noted from C4-C7 with mild associated osteophytic changes. Multilevel facet hypertrophic changes are seen. No acute fracture or acute facet abnormality is noted. Neural foraminal narrowing is noted bilaterally. Soft tissues and spinal canal: Surrounding soft tissue structures again show a mass lesion in the right lateral neck increased in size from the prior study in June of 2022 with some central necrosis and air identified. Upper chest: Visualized lung apices are within normal limits. Other: None IMPRESSION: CT of the head: Mild atrophic changes without acute intracranial abnormality. CT of the maxillofacial bones: Acute nasal bone fracture involving the bridge distally. No other bony abnormality is seen. Soft tissue mass lesion in the  right lateral neck similar to this seen on prior CT examination from June of 2022 although increased in size as described. Some additional soft tissue components extending along the masseter muscle on the right are noted new from the prior exam. This is consistent with the given clinical history of lymphoma. Mild left forehead hematoma is seen. CT of the cervical spine: Multilevel degenerative changes without acute abnormality. Electronically Signed   By: Inez Catalina M.D.   On: 12/05/2020 17:23   CT Cervical Spine Wo Contrast  Result Date: 12/05/2020 CLINICAL DATA:  Recent fall with left facial trauma and pain, initial encounter EXAM: CT HEAD WITHOUT CONTRAST CT MAXILLOFACIAL WITHOUT CONTRAST CT CERVICAL SPINE WITHOUT CONTRAST TECHNIQUE: Multidetector CT imaging of the head, cervical spine, and maxillofacial structures were performed using the standard protocol without intravenous contrast. Multiplanar CT image reconstructions of the cervical spine and maxillofacial structures were also generated. COMPARISON:  09/11/2020 FINDINGS: CT HEAD FINDINGS Brain: No evidence of acute infarction, hemorrhage, hydrocephalus, extra-axial collection or mass lesion/mass effect. Mild atrophic changes are noted. Vascular: No hyperdense vessel or unexpected calcification. Skull: Normal. Negative for fracture or focal lesion. Other: None. CT MAXILLOFACIAL FINDINGS Osseous: Mildly downward displaced distal nasal bone fracture is noted best seen on image number 48 of series 9. This is noted centrally at the bridge of the nose. No other fractures are seen. No other bony abnormality is noted. Orbits: The orbits and their contents are within normal limits. No muscular abnormality is noted. Sinuses: Paranasal sinuses are well aerated. No mucosal abnormality is seen. Soft tissues: Surrounding soft tissue structures again show an enlarged soft tissue mass lesion similar to that seen on prior CT of the neck. It demonstrates  changes of  central necrosis as well as some air within which may be related to recent biopsy. Clinical correlation is recommended. This lesion now measures 6.3 x 4.8 cm which is increased from the prior exam at which time it measured 4.9 x 3.3 cm. Some surrounding soft tissue edema is noted. Some extension along the masseter muscle on the right is noted she was not appreciated on the prior exam. To soft tissue foci are seen. One of these measures approximately 15 mm and the second approximally 18 mm. Mild soft tissue swelling in the forehead is noted. CT CERVICAL SPINE FINDINGS Alignment: Within normal limits. Skull base and vertebrae: Cervical segments are well visualized. Vertebral body height is well maintained. Disc space narrowing is noted from C4-C7 with mild associated osteophytic changes. Multilevel facet hypertrophic changes are seen. No acute fracture or acute facet abnormality is noted. Neural foraminal narrowing is noted bilaterally. Soft tissues and spinal canal: Surrounding soft tissue structures again show a mass lesion in the right lateral neck increased in size from the prior study in June of 2022 with some central necrosis and air identified. Upper chest: Visualized lung apices are within normal limits. Other: None IMPRESSION: CT of the head: Mild atrophic changes without acute intracranial abnormality. CT of the maxillofacial bones: Acute nasal bone fracture involving the bridge distally. No other bony abnormality is seen. Soft tissue mass lesion in the right lateral neck similar to this seen on prior CT examination from June of 2022 although increased in size as described. Some additional soft tissue components extending along the masseter muscle on the right are noted new from the prior exam. This is consistent with the given clinical history of lymphoma. Mild left forehead hematoma is seen. CT of the cervical spine: Multilevel degenerative changes without acute abnormality. Electronically Signed   By:  Inez Catalina M.D.   On: 12/05/2020 17:23   DG Knee Complete 4 Views Left  Result Date: 12/05/2020 CLINICAL DATA:  Fall. EXAM: LEFT KNEE - COMPLETE 4+ VIEW COMPARISON:  None. FINDINGS: No acute fracture or dislocation. No joint effusion. Mild medial compartment joint space narrowing. Bone mineralization is normal. Soft tissues are unremarkable. IMPRESSION: 1. No acute osseous abnormality. 2. Mild medial compartment osteoarthritis. Electronically Signed   By: Titus Dubin M.D.   On: 12/05/2020 18:16   DG Hand Complete Left  Result Date: 12/05/2020 CLINICAL DATA:  Fall. EXAM: LEFT HAND - COMPLETE 3+ VIEW; LEFT WRIST - COMPLETE 3+ VIEW COMPARISON:  None. FINDINGS: No acute fracture or dislocation. Prior trapeziiectomy. Mild osteoarthritis of the DIP joints and thumb IP joint. Bone mineralization is normal. Soft tissues are unremarkable. IMPRESSION: 1. No acute osseous abnormality. Electronically Signed   By: Titus Dubin M.D.   On: 12/05/2020 18:19   CT Maxillofacial WO CM  Result Date: 12/05/2020 CLINICAL DATA:  Recent fall with left facial trauma and pain, initial encounter EXAM: CT HEAD WITHOUT CONTRAST CT MAXILLOFACIAL WITHOUT CONTRAST CT CERVICAL SPINE WITHOUT CONTRAST TECHNIQUE: Multidetector CT imaging of the head, cervical spine, and maxillofacial structures were performed using the standard protocol without intravenous contrast. Multiplanar CT image reconstructions of the cervical spine and maxillofacial structures were also generated. COMPARISON:  09/11/2020 FINDINGS: CT HEAD FINDINGS Brain: No evidence of acute infarction, hemorrhage, hydrocephalus, extra-axial collection or mass lesion/mass effect. Mild atrophic changes are noted. Vascular: No hyperdense vessel or unexpected calcification. Skull: Normal. Negative for fracture or focal lesion. Other: None. CT MAXILLOFACIAL FINDINGS Osseous: Mildly downward displaced distal nasal bone fracture  is noted best seen on image number 48 of series  9. This is noted centrally at the bridge of the nose. No other fractures are seen. No other bony abnormality is noted. Orbits: The orbits and their contents are within normal limits. No muscular abnormality is noted. Sinuses: Paranasal sinuses are well aerated. No mucosal abnormality is seen. Soft tissues: Surrounding soft tissue structures again show an enlarged soft tissue mass lesion similar to that seen on prior CT of the neck. It demonstrates changes of central necrosis as well as some air within which may be related to recent biopsy. Clinical correlation is recommended. This lesion now measures 6.3 x 4.8 cm which is increased from the prior exam at which time it measured 4.9 x 3.3 cm. Some surrounding soft tissue edema is noted. Some extension along the masseter muscle on the right is noted she was not appreciated on the prior exam. To soft tissue foci are seen. One of these measures approximately 15 mm and the second approximally 18 mm. Mild soft tissue swelling in the forehead is noted. CT CERVICAL SPINE FINDINGS Alignment: Within normal limits. Skull base and vertebrae: Cervical segments are well visualized. Vertebral body height is well maintained. Disc space narrowing is noted from C4-C7 with mild associated osteophytic changes. Multilevel facet hypertrophic changes are seen. No acute fracture or acute facet abnormality is noted. Neural foraminal narrowing is noted bilaterally. Soft tissues and spinal canal: Surrounding soft tissue structures again show a mass lesion in the right lateral neck increased in size from the prior study in June of 2022 with some central necrosis and air identified. Upper chest: Visualized lung apices are within normal limits. Other: None IMPRESSION: CT of the head: Mild atrophic changes without acute intracranial abnormality. CT of the maxillofacial bones: Acute nasal bone fracture involving the bridge distally. No other bony abnormality is seen. Soft tissue mass lesion in the  right lateral neck similar to this seen on prior CT examination from June of 2022 although increased in size as described. Some additional soft tissue components extending along the masseter muscle on the right are noted new from the prior exam. This is consistent with the given clinical history of lymphoma. Mild left forehead hematoma is seen. CT of the cervical spine: Multilevel degenerative changes without acute abnormality. Electronically Signed   By: Inez Catalina M.D.   On: 12/05/2020 17:23    Procedures Procedures   Medications Ordered in ED Medications  bacitracin ointment (has no administration in time range)    ED Course  I have reviewed the triage vital signs and the nursing notes.  Pertinent labs & imaging results that were available during my care of the patient were reviewed by me and considered in my medical decision making (see chart for details).    MDM Rules/Calculators/A&P                           80 year old male who presents to the ED today status post mechanical fall that occurred earlier today at cancer center.  Positive head injury however no loss of consciousness and is not anticoagulated.  Has multiple lesions to his face, left wrist/hand, left knee.  He has no focal neurodeficits on exam today.  His wife states he is acting at baseline.  On arrival to the ED vitals are stable.  Given age and facial trauma we will plan for CT head, CT maxillofacial, CT C-spine.  We will also plan for x-ray  of the left wrist/hand and left knee.  He is up-to-date on tetanus.  His wounds do not require closure at this time given there are superficial abrasions.  We will plan for bacitracin and dressings.  CT Head, CT C spine, and CT Max without acute findings Xrays negative as well  Pt stable for discharge home at this time. Instructions for bacitracin for wound healing and PCP follow up. Pt in agreement with plan and stable for discharge home. Attending physician Dr. Maryan Rued has  evaluated patient as well and agrees with plan.   This note was prepared using Dragon voice recognition software and may include unintentional dictation errors due to the inherent limitations of voice recognition software.   Final Clinical Impression(s) / ED Diagnoses Final diagnoses:  Fall, initial encounter  Facial injury, initial encounter  Closed fracture of nasal bone, initial encounter    Rx / DC Orders ED Discharge Orders     None        Discharge Instructions      Your CT scans were reassuring today besides a fracture of your nasal bones. These will heal on their own. I would recommend applying ice to your face to help with pain. Take Tylenol as needed for pain.   Apply bacitracin (neosporin) to your wounds to help with healing.   Please follow up with your PCP regarding ED visit today.  Return to the ED for any new/worsening symptoms         Eustaquio Maize, Hershal Coria 12/05/20 Roselle Locus, MD 12/06/20 1346    Blanchie Dessert, MD 12/23/20 [id number]

## 2020-12-05 NOTE — ED Triage Notes (Signed)
Pt sustained a mechanical fall outside of cancer center.  Abrasions to face and left knee.  Bleeding controlled.  Denies visual disturbance. Denies LOC.

## 2020-12-06 ENCOUNTER — Ambulatory Visit (HOSPITAL_COMMUNITY)
Admission: RE | Admit: 2020-12-06 | Discharge: 2020-12-06 | Disposition: A | Discharge status: Home / Self Care | Payer: Medicare Other | Source: Ambulatory Visit | Attending: Radiation Oncology | Admitting: Radiation Oncology

## 2020-12-06 ENCOUNTER — Other Ambulatory Visit: Payer: Self-pay | Admitting: Radiology

## 2020-12-06 ENCOUNTER — Encounter (HOSPITAL_COMMUNITY): Payer: Self-pay

## 2020-12-06 ENCOUNTER — Ambulatory Visit: Payer: Medicare Other

## 2020-12-06 DIAGNOSIS — I10 Essential (primary) hypertension: Secondary | ICD-10-CM | POA: Insufficient documentation

## 2020-12-06 DIAGNOSIS — K118 Other diseases of salivary glands: Secondary | ICD-10-CM | POA: Diagnosis present

## 2020-12-06 DIAGNOSIS — Z79899 Other long term (current) drug therapy: Secondary | ICD-10-CM | POA: Diagnosis not present

## 2020-12-06 DIAGNOSIS — R221 Localized swelling, mass and lump, neck: Secondary | ICD-10-CM | POA: Insufficient documentation

## 2020-12-06 DIAGNOSIS — E785 Hyperlipidemia, unspecified: Secondary | ICD-10-CM | POA: Insufficient documentation

## 2020-12-06 DIAGNOSIS — Z7901 Long term (current) use of anticoagulants: Secondary | ICD-10-CM | POA: Diagnosis not present

## 2020-12-06 DIAGNOSIS — C8251 Diffuse follicle center lymphoma, lymph nodes of head, face, and neck: Secondary | ICD-10-CM

## 2020-12-06 DIAGNOSIS — Z87891 Personal history of nicotine dependence: Secondary | ICD-10-CM | POA: Insufficient documentation

## 2020-12-06 DIAGNOSIS — Z7982 Long term (current) use of aspirin: Secondary | ICD-10-CM | POA: Diagnosis not present

## 2020-12-06 LAB — CBC WITH DIFFERENTIAL/PLATELET
Abs Immature Granulocytes: 0.1 10*3/uL — ABNORMAL HIGH (ref 0.00–0.07)
Basophils Absolute: 0 10*3/uL (ref 0.0–0.1)
Basophils Relative: 1 %
Eosinophils Absolute: 0.2 10*3/uL (ref 0.0–0.5)
Eosinophils Relative: 9 %
HCT: 32.9 % — ABNORMAL LOW (ref 39.0–52.0)
Hemoglobin: 11.5 g/dL — ABNORMAL LOW (ref 13.0–17.0)
Immature Granulocytes: 6 %
Lymphocytes Relative: 8 %
Lymphs Abs: 0.1 10*3/uL — ABNORMAL LOW (ref 0.7–4.0)
MCH: 33.2 pg (ref 26.0–34.0)
MCHC: 35 g/dL (ref 30.0–36.0)
MCV: 95.1 fL (ref 80.0–100.0)
Monocytes Absolute: 0.7 10*3/uL (ref 0.1–1.0)
Monocytes Relative: 42 %
Neutro Abs: 0.6 10*3/uL — ABNORMAL LOW (ref 1.7–7.7)
Neutrophils Relative %: 34 %
Platelets: 163 10*3/uL (ref 150–400)
RBC: 3.46 MIL/uL — ABNORMAL LOW (ref 4.22–5.81)
RDW: 12.4 % (ref 11.5–15.5)
WBC: 1.7 10*3/uL — ABNORMAL LOW (ref 4.0–10.5)
nRBC: 0 % (ref 0.0–0.2)

## 2020-12-06 LAB — PROTIME-INR
INR: 1 (ref 0.8–1.2)
Prothrombin Time: 13.4 seconds (ref 11.4–15.2)

## 2020-12-06 MED ORDER — SODIUM CHLORIDE 0.9 % IV SOLN: INTRAVENOUS | Status: DC

## 2020-12-06 MED ORDER — LIDOCAINE HCL 1 % IJ SOLN
INTRAMUSCULAR | Status: AC
  Filled 2020-12-06: qty 20

## 2020-12-06 MED ORDER — FENTANYL CITRATE (PF) 100 MCG/2ML IJ SOLN
INTRAMUSCULAR | Status: AC
  Filled 2020-12-06: qty 2

## 2020-12-06 MED ORDER — HEPARIN SOD (PORK) LOCK FLUSH 100 UNIT/ML IV SOLN
500.0000 [IU] | INTRAVENOUS | Status: AC | PRN
  Administered 2020-12-06: 500 [IU]
  Filled 2020-12-06: qty 5

## 2020-12-06 MED ORDER — MIDAZOLAM HCL 2 MG/2ML IJ SOLN
INTRAMUSCULAR | Status: AC
  Filled 2020-12-06: qty 2

## 2020-12-06 MED ORDER — LIDOCAINE HCL (PF) 1 % IJ SOLN
INTRAMUSCULAR | Status: DC | PRN
  Administered 2020-12-06: 5 mL

## 2020-12-06 NOTE — H&P (Addendum)
Chief Complaint: Patient was seen in consultation today for right parotid/neck nodule biopsy at the request of [place]  Referring Physician(s): Eppie Gibson  Supervising Physician: Markus Daft  Patient Status: [name] - Out-pt  History of Present Illness: Brian Castillo is a 80 y.o. male with PMH of stage III follicular lymphoma, arthritis, cataracts, GERD, glaucoma, HLD, HTN and seizure disorder.  Patient recently started palliative radiation therapy on 10/30/2020.  Patient reported to PCP 12/04/2020 that he developed lumps on his right cheek just below cheekbone the same area he is receiving radiation.  CT maxillofacial 12/05/2020 shows soft tissue mass lesion in the right lateral neck that has increased in size since 6/22 with additional soft tissue components extending along the masseter muscle on the right.  Dr. Lowella Dandy is requesting biopsy of subcutaneous nodules in the right neck.  Dr. Serafina Royals has approved ultrasound-guided right parotid/neck nodule biopsy.  CT maxillofacial 12/05/2020:  IMPRESSION: CT of the head: Mild atrophic changes without acute intracranial abnormality.   CT of the maxillofacial bones: Acute nasal bone fracture involving the bridge distally. No other bony abnormality is seen.   Soft tissue mass lesion in the right lateral neck similar to this seen on prior CT examination from June of 2022 although increased in size as described. Some additional soft tissue components extending along the masseter muscle on the right are noted new from the prior exam. This is consistent with the given clinical history of lymphoma.   Mild left forehead hematoma is seen.  Patient resting comfortably in bed.  He is alert, oriented, calm and pleasant.   He is NPO per order. Ordered labs pending. Patient has obvious large mass to right side of face/neck. There are two ulcers covered with dressing where previous biopsies were performed and have not healed.    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   Seizures ([place])    last seizure was in 1988    Past Surgical History:  Procedure Laterality Date   CARPOMETACARPEL SUSPENSION PLASTY Right 08/24/2014   Procedure: RIGHT CMC ARTHOPLASTY WITH DOUBLE TENDON TRANSFER AND REPAIR  RECONSTRUCTION ;  Surgeon: Roseanne Kaufman, MD;  Location: [place];  Service: Orthopedics;  Laterality: Right;   CHOLECYSTECTOMY     COLONOSCOPY     IR IMAGING GUIDED PORT INSERTION  08/14/2020   IR RADIOLOGIST EVAL & MGMT  08/20/2020   KNEE ARTHROSCOPY  2003   bilateral   POLYPECTOMY      Allergies: Brimonidine tartrate-timolol, Lisinopril, and Netarsudil dimesylate  Medications: Prior to Admission medications   Medication Sig Start Date End Date Taking? Authorizing Provider  acyclovir (ZOVIRAX) 400 MG tablet Take 1 tablet (400 mg total) by mouth 2 (two) times daily. Patient not taking: No sig reported 05/29/20   Orson Slick, MD  allopurinol (ZYLOPRIM) 300 MG tablet Take 300 mg by mouth daily.    [provider]  amLODipine (NORVASC) 5 MG tablet Take 5 mg by mouth Daily.  11/28/11   [provider]  aspirin 81 MG tablet Take 81 mg by mouth daily.    [provider]  Calcium Carb-Cholecalciferol (CALCIUM 600 + D PO) Take 1 tablet by mouth daily.    [provider]  cholecalciferol (VITAMIN D) 1000 units tablet Take 1,000 Units by mouth daily.    [provider]  cyclobenzaprine (FLEXERIL) 5 MG tablet Take 1 tablet (5 mg total) by mouth 3 (three) times daily as needed for muscle spasms. Patient not taking: Reported on 11/05/2020 09/09/20   Orson Slick, MD  gabapentin (NEURONTIN) 300 MG capsule Take 2  capsules (600 mg total) by mouth 3 (three) times daily. Take 1 capsule (300 mg total) by mouth twice a day and 2 capsules (600mg ) at bedtime 11/05/20   Vaslow, Acey Lav, MD  HYDROmorphone (DILAUDID) 2 MG tablet Take 1 tablet (2 mg total) by mouth every 4 (four) hours as needed for severe pain. 12/05/20   Orson Slick, MD  ibuprofen (ADVIL) 200 MG tablet Take 400 mg by mouth every 6 (six) hours as needed for mild pain.    [provider]  Latanoprostene Bunod (VYZULTA) 0.024 % SOLN Place 1 drop into both eyes at bedtime.    [provider]  lidocaine (XYLOCAINE) 2 % solution Use as directed 15 mLs in the mouth or throat every 3 (three) hours as needed for mouth pain. Swish, gargle and spit out every 3 hours as needed for mouth/throat  pain 12/05/20   Orson Slick, MD  lidocaine-prilocaine (EMLA) cream Apply 1 application topically as needed. Patient taking differently: Apply 1 application topically daily as needed (port access). 08/28/20   Orson Slick, MD  metoprolol succinate (TOPROL-XL) 25 MG 24 hr tablet Take 25 mg by mouth Daily.  11/28/11   [provider]  oxyCODONE (OXY IR/ROXICODONE) 5 MG immediate release tablet Take 2-3 tablets (10-15 mg total) by mouth every 4 (four) hours as needed for severe pain. Patient not taking: Reported on 11/05/2020 10/24/20   Orson Slick, MD  phenytoin (DILANTIN) 100 MG ER capsule Take 200 mg by mouth 2 (two) times daily.    [provider]  rosuvastatin (CRESTOR) 20 MG tablet Take 20 mg by mouth daily.    [provider]  senna-docusate (SENOKOT-S) 8.6-50 MG tablet Take 2 tablets by mouth at bedtime as needed for moderate constipation. Patient not taking: Reported on 11/05/2020 09/16/20   Damita Lack, MD  timolol (BETIMOL) 0.5 % ophthalmic solution Place 1 drop into both eyes 2 (two) times daily.    [provider]     Family History  Problem Relation Age of Onset   Colon cancer Neg Hx     Colon polyps Neg Hx     Social History   Socioeconomic History   Marital status: Married    Spouse name: Not on file   Number of children: Not on file   Years of education: Not on file   Highest education level: Not on file  Occupational History   Not on file  Tobacco Use   Smoking status: Former   Smokeless tobacco: Never   Tobacco comments:    quit 1984  Vaping Use   Vaping Use: Never used  Substance and Sexual Activity   Alcohol use: Yes    Alcohol/week: 6.0 standard drinks    Types: 6 Glasses of wine per week   Drug use: No   Sexual activity: Not on file  Other Topics Concern   Not on file  Social History Narrative   Not on  file   Social Determinants of Health   Financial Resource Strain: Not on file  Food Insecurity: Not on file  Transportation Needs: Not on file  Physical Activity: Not on file  Stress: Not on file  Social Connections: Not on file     Review of Systems: A 12 point ROS discussed and pertinent positives are indicated in the HPI above.  All other systems are negative.  Review of Systems  Constitutional:  Negative for chills and fever.  Respiratory:  Negative for cough and shortness of breath.   Cardiovascular:  Negative for chest pain and leg swelling.  Gastrointestinal:  Negative for abdominal pain, blood in stool, diarrhea, nausea and vomiting.   Vital Signs: BP (!) 155/64   Pulse 71   Temp 98.3 F (36.8 C) (Oral)   Resp 16   SpO2 93%   Physical Exam HENT:     Head: Normocephalic.     Comments: Abrasions noted to L forehead, nose and L eye from recent mechanical fall    Nose: Nose normal.     Comments: Abrasions to tip of nose from recent mechanical fall Pt reports fractured nose Cardiovascular:     Rate and Rhythm: Normal rate and regular rhythm.     Pulses: Normal pulses.  Pulmonary:     Effort: No respiratory distress.     Breath sounds: Normal breath sounds. No stridor. No wheezing, rhonchi or rales.  Abdominal:      General: There is no distension.     Palpations: Abdomen is soft.     Tenderness: There is no abdominal tenderness. There is no guarding.  Musculoskeletal:     Right lower leg: No edema.     Left lower leg: No edema.  Skin:    General: Skin is warm and dry.  Neurological:     Mental Status: He is alert and oriented to person, place, and time. Mental status is at baseline.  Psychiatric:        Mood and Affect: Mood normal.        Behavior: Behavior normal.        Thought Content: Thought content normal.        Judgment: Judgment normal.    Imaging: DG Wrist Complete Left  Result Date: 12/05/2020 CLINICAL DATA:  Fall. EXAM: LEFT HAND - COMPLETE 3+ VIEW; LEFT WRIST - COMPLETE 3+ VIEW COMPARISON:  None. FINDINGS: No acute fracture or dislocation. Prior trapeziiectomy. Mild osteoarthritis of the DIP joints and thumb IP joint. Bone mineralization is normal. Soft tissues are unremarkable. IMPRESSION: 1. No acute osseous abnormality. Electronically Signed   By: Titus Dubin M.D.   On: 12/05/2020 18:19   CT Head Wo Contrast  Result Date: 12/05/2020 CLINICAL DATA:  Recent fall with left facial trauma and pain, initial encounter EXAM: CT HEAD WITHOUT CONTRAST CT MAXILLOFACIAL WITHOUT CONTRAST CT CERVICAL SPINE WITHOUT CONTRAST TECHNIQUE: Multidetector CT imaging of the head, cervical spine, and maxillofacial structures were performed using the standard protocol without intravenous contrast. Multiplanar CT image reconstructions of the cervical spine and maxillofacial structures were also generated. COMPARISON:  09/11/2020 FINDINGS: CT HEAD FINDINGS Brain: No evidence of acute infarction, hemorrhage, hydrocephalus, extra-axial collection or mass lesion/mass effect. Mild atrophic changes are noted. Vascular: No hyperdense vessel or unexpected calcification. Skull: Normal. Negative for fracture or focal lesion. Other: None. CT MAXILLOFACIAL FINDINGS Osseous: Mildly downward displaced distal nasal bone  fracture is noted best seen on image number 48 of series 9. This is noted centrally at the  bridge of the nose. No other fractures are seen. No other bony abnormality is noted. Orbits: The orbits and their contents are within normal limits. No muscular abnormality is noted. Sinuses: Paranasal sinuses are well aerated. No mucosal abnormality is seen. Soft tissues: Surrounding soft tissue structures again show an enlarged soft tissue mass lesion similar to that seen on prior CT of the neck. It demonstrates changes of central necrosis as well as some air within which may be related to recent biopsy. Clinical correlation is recommended. This lesion now measures 6.3 x 4.8 cm which is increased from the prior exam at which time it measured 4.9 x 3.3 cm. Some surrounding soft tissue edema is noted. Some extension along the masseter muscle on the right is noted she was not appreciated on the prior exam. To soft tissue foci are seen. One of these measures approximately 15 mm and the second approximally 18 mm. Mild soft tissue swelling in the forehead is noted. CT CERVICAL SPINE FINDINGS Alignment: Within normal limits. Skull base and vertebrae: Cervical segments are well visualized. Vertebral body height is well maintained. Disc space narrowing is noted from C4-C7 with mild associated osteophytic changes. Multilevel facet hypertrophic changes are seen. No acute fracture or acute facet abnormality is noted. Neural foraminal narrowing is noted bilaterally. Soft tissues and spinal canal: Surrounding soft tissue structures again show a mass lesion in the right lateral neck increased in size from the prior study in June of 2022 with some central necrosis and air identified. Upper chest: Visualized lung apices are within normal limits. Other: None IMPRESSION: CT of the head: Mild atrophic changes without acute intracranial abnormality. CT of the maxillofacial bones: Acute nasal bone fracture involving the bridge distally. No other  bony abnormality is seen. Soft tissue mass lesion in the right lateral neck similar to this seen on prior CT examination from June of 2022 although increased in size as described. Some additional soft tissue components extending along the masseter muscle on the right are noted new from the prior exam. This is consistent with the given clinical history of lymphoma. Mild left forehead hematoma is seen. CT of the cervical spine: Multilevel degenerative changes without acute abnormality. Electronically Signed   By: Inez Catalina M.D.   On: 12/05/2020 17:23   CT Cervical Spine Wo Contrast  Result Date: 12/05/2020 CLINICAL DATA:  Recent fall with left facial trauma and pain, initial encounter EXAM: CT HEAD WITHOUT CONTRAST CT MAXILLOFACIAL WITHOUT CONTRAST CT CERVICAL SPINE WITHOUT CONTRAST TECHNIQUE: Multidetector CT imaging of the head, cervical spine, and maxillofacial structures were performed using the standard protocol without intravenous contrast. Multiplanar CT image reconstructions of the cervical spine and maxillofacial structures were also generated. COMPARISON:  09/11/2020 FINDINGS: CT HEAD FINDINGS Brain: No evidence of acute infarction, hemorrhage, hydrocephalus, extra-axial collection or mass lesion/mass effect. Mild atrophic changes are noted. Vascular: No hyperdense vessel or unexpected calcification. Skull: Normal. Negative for fracture or focal lesion. Other: None. CT MAXILLOFACIAL FINDINGS Osseous: Mildly downward displaced distal nasal bone fracture is noted best seen on image number 48 of series 9. This is noted centrally at the bridge of the nose. No other fractures are seen. No other bony abnormality is noted. Orbits: The orbits and their contents are within normal limits. No muscular abnormality is noted. Sinuses: Paranasal sinuses are well aerated. No mucosal abnormality is seen. Soft tissues: Surrounding soft tissue structures again show an enlarged soft tissue mass lesion similar to that  seen on prior CT of the neck.  It demonstrates changes of central necrosis as well as some air within which may be related to recent biopsy. Clinical correlation is recommended. This lesion now measures 6.3 x 4.8 cm which is increased from the prior exam at which time it measured 4.9 x 3.3 cm. Some surrounding soft tissue edema is noted. Some extension along the masseter muscle on the right is noted she was not appreciated on the prior exam. To soft tissue foci are seen. One of these measures approximately 15 mm and the second approximally 18 mm. Mild soft tissue swelling in the forehead is noted. CT CERVICAL SPINE FINDINGS Alignment: Within normal limits. Skull base and vertebrae: Cervical segments are well visualized. Vertebral body height is well maintained. Disc space narrowing is noted from C4-C7 with mild associated osteophytic changes. Multilevel facet hypertrophic changes are seen. No acute fracture or acute facet abnormality is noted. Neural foraminal narrowing is noted bilaterally. Soft tissues and spinal canal: Surrounding soft tissue structures again show a mass lesion in the right lateral neck increased in size from the prior study in June of 2022 with some central necrosis and air identified. Upper chest: Visualized lung apices are within normal limits. Other: None IMPRESSION: CT of the head: Mild atrophic changes without acute intracranial abnormality. CT of the maxillofacial bones: Acute nasal bone fracture involving the bridge distally. No other bony abnormality is seen. Soft tissue mass lesion in the right lateral neck similar to this seen on prior CT examination from June of 2022 although increased in size as described. Some additional soft tissue components extending along the masseter muscle on the right are noted new from the prior exam. This is consistent with the given clinical history of lymphoma. Mild left forehead hematoma is seen. CT of the cervical spine: Multilevel degenerative changes  without acute abnormality. Electronically Signed   By: Inez Catalina M.D.   On: 12/05/2020 17:23   DG Knee Complete 4 Views Left  Result Date: 12/05/2020 CLINICAL DATA:  Fall. EXAM: LEFT KNEE - COMPLETE 4+ VIEW COMPARISON:  None. FINDINGS: No acute fracture or dislocation. No joint effusion. Mild medial compartment joint space narrowing. Bone mineralization is normal. Soft tissues are unremarkable. IMPRESSION: 1. No acute osseous abnormality. 2. Mild medial compartment osteoarthritis. Electronically Signed   By: Titus Dubin M.D.   On: 12/05/2020 18:16   DG Hand Complete Left  Result Date: 12/05/2020 CLINICAL DATA:  Fall. EXAM: LEFT HAND - COMPLETE 3+ VIEW; LEFT WRIST - COMPLETE 3+ VIEW COMPARISON:  None. FINDINGS: No acute fracture or dislocation. Prior trapeziiectomy. Mild osteoarthritis of the DIP joints and thumb IP joint. Bone mineralization is normal. Soft tissues are unremarkable. IMPRESSION: 1. No acute osseous abnormality. Electronically Signed   By: Titus Dubin M.D.   On: 12/05/2020 18:19   CT Maxillofacial WO CM  Result Date: 12/05/2020 CLINICAL DATA:  Recent fall with left facial trauma and pain, initial encounter EXAM: CT HEAD WITHOUT CONTRAST CT MAXILLOFACIAL WITHOUT CONTRAST CT CERVICAL SPINE WITHOUT CONTRAST TECHNIQUE: Multidetector CT imaging of the head, cervical spine, and maxillofacial structures were performed using the standard protocol without intravenous contrast. Multiplanar CT image reconstructions of the cervical spine and maxillofacial structures were also generated. COMPARISON:  09/11/2020 FINDINGS: CT HEAD FINDINGS Brain: No evidence of acute infarction, hemorrhage, hydrocephalus, extra-axial collection or mass lesion/mass effect. Mild atrophic changes are noted. Vascular: No hyperdense vessel or unexpected calcification. Skull: Normal. Negative for fracture or focal lesion. Other: None. CT MAXILLOFACIAL FINDINGS Osseous: Mildly downward displaced distal nasal bone  fracture is noted best seen on image number 48 of series 9. This is noted centrally at the bridge of the nose. No other fractures are seen. No other bony abnormality is noted. Orbits: The orbits and their contents are within normal limits. No muscular abnormality is noted. Sinuses: Paranasal sinuses are well aerated. No mucosal abnormality is seen. Soft tissues: Surrounding soft tissue structures again show an enlarged soft tissue mass lesion similar to that seen on prior CT of the neck. It demonstrates changes of central necrosis as well as some air within which may be related to recent biopsy. Clinical correlation is recommended. This lesion now measures 6.3 x 4.8 cm which is increased from the prior exam at which time it measured 4.9 x 3.3 cm. Some surrounding soft tissue edema is noted. Some extension along the masseter muscle on the right is noted she was not appreciated on the prior exam. To soft tissue foci are seen. One of these measures approximately 15 mm and the second approximally 18 mm. Mild soft tissue swelling in the forehead is noted. CT CERVICAL SPINE FINDINGS Alignment: Within normal limits. Skull base and vertebrae: Cervical segments are well visualized. Vertebral body height is well maintained. Disc space narrowing is noted from C4-C7 with mild associated osteophytic changes. Multilevel facet hypertrophic changes are seen. No acute fracture or acute facet abnormality is noted. Neural foraminal narrowing is noted bilaterally. Soft tissues and spinal canal: Surrounding soft tissue structures again show a mass lesion in the right lateral neck increased in size from the prior study in June of 2022 with some central necrosis and air identified. Upper chest: Visualized lung apices are within normal limits. Other: None IMPRESSION: CT of the head: Mild atrophic changes without acute intracranial abnormality. CT of the maxillofacial bones: Acute nasal bone fracture involving the bridge distally. No other  bony abnormality is seen. Soft tissue mass lesion in the right lateral neck similar to this seen on prior CT examination from June of 2022 although increased in size as described. Some additional soft tissue components extending along the masseter muscle on the right are noted new from the prior exam. This is consistent with the given clinical history of lymphoma. Mild left forehead hematoma is seen. CT of the cervical spine: Multilevel degenerative changes without acute abnormality. Electronically Signed   By: Inez Catalina M.D.   On: 12/05/2020 17:23    Labs:  CBC: Recent Labs    11/07/20 1040 11/21/20 1116 12/05/20 1357 12/06/20 1200  WBC 4.8 1.6* 1.6* 1.7*  HGB 11.5* 11.0* 12.1* 11.5*  HCT 32.9* 31.8* 35.2* 32.9*  PLT 184 176 173 163    COAGS: Recent Labs    02/06/20 1115 08/14/20 1355 12/06/20 1200  INR 1.0 1.0 1.0    BMP: Recent Labs    10/25/20 1122 11/07/20 1040 11/21/20 1116 12/05/20 1357  NA 139 139 140 137  K 4.1 4.1 4.4 4.2  CL 103 102 104 101  CO2 27 29 26 26   GLUCOSE 107* 102* 96 102*  BUN 19 16 17 16   CALCIUM 9.2 9.6 9.2 9.5  CREATININE 0.81 0.80 0.78 0.82  GFRNONAA >60 >60 >60 >60    LIVER FUNCTION TESTS: Recent Labs    10/25/20 1122 11/07/20 1040 11/21/20 1116 12/05/20 1357  BILITOT 0.3 0.3 0.3 0.3  AST 15 17 17 20   ALT 12 15 14 21   ALKPHOS 131* 128* 140* 128*  PROT 6.9 7.1 6.8 7.5  ALBUMIN 3.7 3.6 3.6 3.7  TUMOR MARKERS: No results for input(s): AFPTM, CEA, CA199, CHROMGRNA in the last 8760 hours.  Assessment and Plan: History of stage III follicular lymphoma, arthritis, cataracts, GERD, glaucoma, HLD, HTN and seizure disorder.  Patient recently started palliative radiation therapy on 10/30/2020.  Patient reported to PCP 12/04/2020 that he developed lumps on his right cheek just below cheekbone the same area he is receiving radiation.  CT maxillofacial 12/05/2020 shows soft tissue mass lesion in the right lateral neck that has increased  in size since 6/22 with additional soft tissue components extending along the masseter muscle on the right.  Dr. Lowella Dandy is requesting biopsy of subcutaneous nodules in the right neck.  Dr. Serafina Royals has approved ultrasound-guided right parotid/neck nodule biopsy.  Patient does not take thinners. Labs 12/05/20: Patient is neutropenic with WBC count of 1.7 Hgb 11.5 VSS  Risks and benefits of right parotid/neck nodule biopsy was discussed with the patient and/or patient's family including, but not limited to bleeding, infection, damage to adjacent structures or low yield requiring additional tests.  All of the questions were answered and there is agreement to proceed.  Consent signed and in chart.   Thank you for this interesting consult.  I greatly enjoyed meeting Cheveyo RUDOLFO BRANDOW and look forward to participating in their care.  A copy of this report was sent to the requesting provider on this date.  Electronically Signed: Tyson Alias, NP 12/06/2020, 12:44 PM   I spent a total of 30 minutes in face to face in clinical consultation, greater than 50% of which was counseling/coordinating care for right parotid/neck nodule biopsy.

## 2020-12-06 NOTE — Procedures (Signed)
Interventional Radiology Procedure:   Indications: Lymphoma with new nodules on right side of face.  Procedure: US guided core biopsy of right parotid nodule  Findings: 5 cores obtained from right parotid nodule.  Right masseter nodule is connected to larger right neck mass.  Complications: No immediate complications noted.     EBL: Minimal  Plan: Discharge to home in 1 hour   Jaclene Bartelt R. Anselm Pancoast, MD  Pager: [phone]

## 2020-12-07 LAB — PHENYTOIN LEVEL, FREE AND TOTAL
Phenytoin, Free: 0.6 ug/mL — ABNORMAL LOW (ref 1.0–2.0)
Phenytoin, Total: 8.7 ug/mL — ABNORMAL LOW (ref 10.0–20.0)

## 2020-12-09 ENCOUNTER — Ambulatory Visit: Payer: Medicare Other

## 2020-12-09 ENCOUNTER — Other Ambulatory Visit: Payer: Self-pay | Admitting: Radiation Oncology

## 2020-12-09 DIAGNOSIS — C8251 Diffuse follicle center lymphoma, lymph nodes of head, face, and neck: Secondary | ICD-10-CM

## 2020-12-09 MED ORDER — DEXAMETHASONE 4 MG PO TABS: ORAL_TABLET | ORAL | 0 refills | Status: DC

## 2020-12-09 NOTE — Progress Notes (Signed)
I spoke with Brian Castillo today.  He reports severe shooting pain since the biopsy of his right upper neck mass.  I told him to increase his gabapentin to 600 mg 3 times a day and we will start him on a dexamethasone taper.  Because he is also taking Dilantin we will check his levels on Monday, October 3.  He is pleased with this plan and knows that he will hear from [place] once we have the results to his biopsy.  He will call medical oncology if he needs more pain medication (such as Dilaudid). -----------------------------------  Eppie Gibson, MD

## 2020-12-10 ENCOUNTER — Other Ambulatory Visit: Payer: Self-pay

## 2020-12-10 ENCOUNTER — Ambulatory Visit: Payer: Medicare Other

## 2020-12-10 ENCOUNTER — Emergency Department (HOSPITAL_BASED_OUTPATIENT_CLINIC_OR_DEPARTMENT_OTHER): Payer: Medicare Other | Admitting: Radiology

## 2020-12-10 ENCOUNTER — Encounter (HOSPITAL_BASED_OUTPATIENT_CLINIC_OR_DEPARTMENT_OTHER): Payer: Self-pay

## 2020-12-10 ENCOUNTER — Encounter: Payer: Self-pay | Admitting: Hematology and Oncology

## 2020-12-10 ENCOUNTER — Inpatient Hospital Stay (HOSPITAL_BASED_OUTPATIENT_CLINIC_OR_DEPARTMENT_OTHER)
Admission: EM | Admit: 2020-12-10 | Discharge: 2020-12-19 | DRG: 854 | Disposition: A | Discharge status: Home / Self Care | Payer: Medicare Other | Attending: Internal Medicine | Admitting: Internal Medicine

## 2020-12-10 ENCOUNTER — Emergency Department (HOSPITAL_BASED_OUTPATIENT_CLINIC_OR_DEPARTMENT_OTHER): Payer: Medicare Other

## 2020-12-10 DIAGNOSIS — Z9049 Acquired absence of other specified parts of digestive tract: Secondary | ICD-10-CM | POA: Diagnosis not present

## 2020-12-10 DIAGNOSIS — A419 Sepsis, unspecified organism: Secondary | ICD-10-CM | POA: Diagnosis present

## 2020-12-10 DIAGNOSIS — H409 Unspecified glaucoma: Secondary | ICD-10-CM | POA: Diagnosis present

## 2020-12-10 DIAGNOSIS — D649 Anemia, unspecified: Secondary | ICD-10-CM | POA: Diagnosis present

## 2020-12-10 DIAGNOSIS — C8330 Diffuse large B-cell lymphoma, unspecified site: Secondary | ICD-10-CM | POA: Diagnosis present

## 2020-12-10 DIAGNOSIS — R221 Localized swelling, mass and lump, neck: Secondary | ICD-10-CM | POA: Diagnosis present

## 2020-12-10 DIAGNOSIS — C8251 Diffuse follicle center lymphoma, lymph nodes of head, face, and neck: Secondary | ICD-10-CM

## 2020-12-10 DIAGNOSIS — D709 Neutropenia, unspecified: Secondary | ICD-10-CM | POA: Diagnosis not present

## 2020-12-10 DIAGNOSIS — L03221 Cellulitis of neck: Secondary | ICD-10-CM | POA: Diagnosis not present

## 2020-12-10 DIAGNOSIS — M5481 Occipital neuralgia: Secondary | ICD-10-CM | POA: Diagnosis present

## 2020-12-10 DIAGNOSIS — R5081 Fever presenting with conditions classified elsewhere: Secondary | ICD-10-CM | POA: Diagnosis not present

## 2020-12-10 DIAGNOSIS — Z86010 Personal history of colonic polyps: Secondary | ICD-10-CM | POA: Diagnosis not present

## 2020-12-10 DIAGNOSIS — Z20822 Contact with and (suspected) exposure to covid-19: Secondary | ICD-10-CM | POA: Diagnosis present

## 2020-12-10 DIAGNOSIS — Z888 Allergy status to other drugs, medicaments and biological substances status: Secondary | ICD-10-CM | POA: Diagnosis not present

## 2020-12-10 DIAGNOSIS — E785 Hyperlipidemia, unspecified: Secondary | ICD-10-CM | POA: Diagnosis present

## 2020-12-10 DIAGNOSIS — R911 Solitary pulmonary nodule: Secondary | ICD-10-CM | POA: Diagnosis present

## 2020-12-10 DIAGNOSIS — I10 Essential (primary) hypertension: Secondary | ICD-10-CM | POA: Diagnosis present

## 2020-12-10 DIAGNOSIS — G40909 Epilepsy, unspecified, not intractable, without status epilepticus: Secondary | ICD-10-CM | POA: Diagnosis present

## 2020-12-10 DIAGNOSIS — R6520 Severe sepsis without septic shock: Secondary | ICD-10-CM | POA: Diagnosis present

## 2020-12-10 DIAGNOSIS — Z87891 Personal history of nicotine dependence: Secondary | ICD-10-CM | POA: Diagnosis not present

## 2020-12-10 LAB — COMPREHENSIVE METABOLIC PANEL
ALT: 17 U/L (ref 0–44)
AST: 14 U/L — ABNORMAL LOW (ref 15–41)
Albumin: 3.9 g/dL (ref 3.5–5.0)
Alkaline Phosphatase: 78 U/L (ref 38–126)
Anion gap: 10 (ref 5–15)
BUN: 12 mg/dL (ref 8–23)
CO2: 27 mmol/L (ref 22–32)
Calcium: 9.3 mg/dL (ref 8.9–10.3)
Chloride: 100 mmol/L (ref 98–111)
Creatinine, Ser: 0.7 mg/dL (ref 0.61–1.24)
GFR, Estimated: >60 mL/min (ref >60)
Glucose, Bld: 131 mg/dL — ABNORMAL HIGH (ref 70–99)
Potassium: 3.7 mmol/L (ref 3.5–5.1)
Sodium: 137 mmol/L (ref 135–145)
Total Bilirubin: 0.3 mg/dL (ref 0.3–1.2)
Total Protein: 6.9 g/dL (ref 6.5–8.1)

## 2020-12-10 LAB — CBC WITH DIFFERENTIAL/PLATELET
Abs Immature Granulocytes: 0.05 10*3/uL (ref 0.00–0.07)
Basophils Absolute: 0 10*3/uL (ref 0.0–0.1)
Basophils Relative: 1 %
Eosinophils Absolute: 0 10*3/uL (ref 0.0–0.5)
Eosinophils Relative: 4 %
HCT: 32.9 % — ABNORMAL LOW (ref 39.0–52.0)
Hemoglobin: 11.6 g/dL — ABNORMAL LOW (ref 13.0–17.0)
Immature Granulocytes: 5 %
Lymphocytes Relative: 24 %
Lymphs Abs: 0.2 10*3/uL — ABNORMAL LOW (ref 0.7–4.0)
MCH: 32.6 pg (ref 26.0–34.0)
MCHC: 35.3 g/dL (ref 30.0–36.0)
MCV: 92.4 fL (ref 80.0–100.0)
Monocytes Absolute: 0.6 10*3/uL (ref 0.1–1.0)
Monocytes Relative: 58 %
Neutro Abs: 0.1 10*3/uL — CL (ref 1.7–7.7)
Neutrophils Relative %: 8 %
Platelet Morphology: NORMAL
Platelets: 181 10*3/uL (ref 150–400)
RBC: 3.56 MIL/uL — ABNORMAL LOW (ref 4.22–5.81)
RDW: 12.2 % (ref 11.5–15.5)
WBC: 1 10*3/uL — CL (ref 4.0–10.5)
nRBC: 0 % (ref 0.0–0.2)

## 2020-12-10 LAB — SURGICAL PATHOLOGY

## 2020-12-10 LAB — TSH: TSH: 2.167 u[IU]/mL (ref 0.350–4.500)

## 2020-12-10 LAB — RESP PANEL BY RT-PCR (FLU A&B, COVID) ARPGX2
Influenza A by PCR: NEGATIVE
Influenza B by PCR: NEGATIVE
SARS Coronavirus 2 by RT PCR: NEGATIVE

## 2020-12-10 LAB — LACTIC ACID, PLASMA
Lactic Acid, Venous: 1.1 mmol/L (ref 0.5–1.9)
Lactic Acid, Venous: 1 mmol/L (ref 0.5–1.9)

## 2020-12-10 MED ORDER — IOHEXOL 350 MG/ML SOLN
100.0000 mL | Freq: Once | INTRAVENOUS | Status: AC | PRN
  Administered 2020-12-10: 75 mL via INTRAVENOUS

## 2020-12-10 MED ORDER — SODIUM CHLORIDE 0.9% FLUSH
3.0000 mL | Freq: Two times a day (BID) | INTRAVENOUS | Status: DC
  Administered 2020-12-10 – 2020-12-19 (×18): 3 mL via INTRAVENOUS

## 2020-12-10 MED ORDER — CHLORHEXIDINE GLUCONATE CLOTH 2 % EX PADS
6.0000 | MEDICATED_PAD | Freq: Every day | CUTANEOUS | Status: DC
  Administered 2020-12-12 – 2020-12-19 (×7): 6 via TOPICAL

## 2020-12-10 MED ORDER — VANCOMYCIN HCL 1750 MG/350ML IV SOLN
1750.0000 mg | INTRAVENOUS | Status: DC
  Filled 2020-12-10: qty 350

## 2020-12-10 MED ORDER — ACETAMINOPHEN 325 MG PO TABS
650.0000 mg | ORAL_TABLET | ORAL | Status: DC | PRN
  Administered 2020-12-13 – 2020-12-14 (×2): 650 mg via ORAL
  Filled 2020-12-10 (×2): qty 2

## 2020-12-10 MED ORDER — ACETAMINOPHEN 325 MG PO TABS: 650.0000 mg | ORAL_TABLET | Freq: Four times a day (QID) | ORAL | Status: DC | PRN

## 2020-12-10 MED ORDER — FENTANYL CITRATE PF 50 MCG/ML IJ SOSY
50.0000 ug | PREFILLED_SYRINGE | Freq: Once | INTRAMUSCULAR | Status: AC
  Administered 2020-12-10: 50 ug via INTRAVENOUS
  Filled 2020-12-10: qty 1

## 2020-12-10 MED ORDER — VANCOMYCIN HCL 2000 MG/400ML IV SOLN
2000.0000 mg | Freq: Once | INTRAVENOUS | Status: DC
  Filled 2020-12-10: qty 400

## 2020-12-10 MED ORDER — SODIUM CHLORIDE 0.9 % IV SOLN
2.0000 g | Freq: Three times a day (TID) | INTRAVENOUS | Status: DC
  Administered 2020-12-10 – 2020-12-12 (×7): 2 g via INTRAVENOUS
  Filled 2020-12-10 (×8): qty 2

## 2020-12-10 MED ORDER — PHENYTOIN SODIUM EXTENDED 100 MG PO CAPS
200.0000 mg | ORAL_CAPSULE | Freq: Two times a day (BID) | ORAL | Status: DC
  Administered 2020-12-10 – 2020-12-19 (×19): 200 mg via ORAL
  Filled 2020-12-10 (×19): qty 2

## 2020-12-10 MED ORDER — ACETAMINOPHEN 650 MG RE SUPP: 650.0000 mg | Freq: Four times a day (QID) | RECTAL | Status: DC | PRN

## 2020-12-10 MED ORDER — MORPHINE SULFATE (PF) 4 MG/ML IV SOLN
4.0000 mg | INTRAVENOUS | Status: DC | PRN
  Administered 2020-12-10 – 2020-12-17 (×33): 4 mg via INTRAVENOUS
  Filled 2020-12-10 (×33): qty 1

## 2020-12-10 MED ORDER — METOPROLOL SUCCINATE ER 25 MG PO TB24
25.0000 mg | ORAL_TABLET | Freq: Every day | ORAL | Status: DC
  Administered 2020-12-10 – 2020-12-19 (×10): 25 mg via ORAL
  Filled 2020-12-10 (×10): qty 1

## 2020-12-10 MED ORDER — MORPHINE SULFATE (PF) 2 MG/ML IV SOLN
INTRAVENOUS | Status: AC
  Filled 2020-12-10: qty 1

## 2020-12-10 MED ORDER — SODIUM CHLORIDE 0.9 % IV BOLUS
1000.0000 mL | Freq: Once | INTRAVENOUS | Status: AC
  Administered 2020-12-10: 1000 mL via INTRAVENOUS

## 2020-12-10 MED ORDER — VANCOMYCIN HCL IN DEXTROSE 1-5 GM/200ML-% IV SOLN
1000.0000 mg | Freq: Once | INTRAVENOUS | Status: AC
  Administered 2020-12-10: 1000 mg via INTRAVENOUS
  Filled 2020-12-10: qty 200

## 2020-12-10 MED ORDER — AMLODIPINE BESYLATE 5 MG PO TABS
5.0000 mg | ORAL_TABLET | Freq: Every day | ORAL | Status: DC
  Administered 2020-12-10 – 2020-12-19 (×10): 5 mg via ORAL
  Filled 2020-12-10 (×10): qty 1

## 2020-12-10 MED ORDER — SODIUM CHLORIDE 0.9 % IV SOLN
2.0000 g | Freq: Once | INTRAVENOUS | Status: AC
  Administered 2020-12-10: 2 g via INTRAVENOUS
  Filled 2020-12-10: qty 2

## 2020-12-10 MED ORDER — ONDANSETRON HCL 4 MG PO TABS: 4.0000 mg | ORAL_TABLET | Freq: Four times a day (QID) | ORAL | Status: DC | PRN

## 2020-12-10 MED ORDER — VANCOMYCIN HCL 1750 MG/350ML IV SOLN
1750.0000 mg | INTRAVENOUS | Status: DC
  Administered 2020-12-11 – 2020-12-12 (×2): 1750 mg via INTRAVENOUS
  Filled 2020-12-10 (×2): qty 350

## 2020-12-10 MED ORDER — MORPHINE SULFATE (PF) 2 MG/ML IV SOLN
2.0000 mg | INTRAVENOUS | Status: DC | PRN
  Administered 2020-12-10: 2 mg via INTRAVENOUS
  Filled 2020-12-10 (×2): qty 1

## 2020-12-10 MED ORDER — LATANOPROSTENE BUNOD 0.024 % OP SOLN
1.0000 [drp] | Freq: Every day | OPHTHALMIC | Status: DC
  Administered 2020-12-10 – 2020-12-18 (×9): 1 [drp] via OPHTHALMIC

## 2020-12-10 MED ORDER — ACETAMINOPHEN 650 MG RE SUPP: 650.0000 mg | RECTAL | Status: DC | PRN

## 2020-12-10 MED ORDER — ACYCLOVIR 200 MG PO CAPS
400.0000 mg | ORAL_CAPSULE | Freq: Two times a day (BID) | ORAL | Status: DC
  Filled 2020-12-10 (×19): qty 2

## 2020-12-10 MED ORDER — OXYCODONE HCL 5 MG PO TABS
5.0000 mg | ORAL_TABLET | ORAL | Status: DC | PRN
  Administered 2020-12-10 – 2020-12-12 (×2): 5 mg via ORAL
  Filled 2020-12-10 (×2): qty 1

## 2020-12-10 MED ORDER — GABAPENTIN 300 MG PO CAPS
600.0000 mg | ORAL_CAPSULE | Freq: Three times a day (TID) | ORAL | Status: DC
  Administered 2020-12-10 – 2020-12-19 (×28): 600 mg via ORAL
  Filled 2020-12-10 (×29): qty 2

## 2020-12-10 MED ORDER — MORPHINE SULFATE (PF) 2 MG/ML IV SOLN
2.0000 mg | INTRAVENOUS | Status: DC | PRN
  Administered 2020-12-10: 2 mg via INTRAVENOUS

## 2020-12-10 MED ORDER — DEXAMETHASONE 4 MG PO TABS: 2.0000 mg | ORAL_TABLET | Freq: Every day | ORAL | Status: DC

## 2020-12-10 MED ORDER — ENOXAPARIN SODIUM 40 MG/0.4ML IJ SOSY
40.0000 mg | PREFILLED_SYRINGE | Freq: Every day | INTRAMUSCULAR | Status: DC
  Administered 2020-12-11 – 2020-12-19 (×9): 40 mg via SUBCUTANEOUS
  Filled 2020-12-10 (×9): qty 0.4

## 2020-12-10 MED ORDER — VANCOMYCIN HCL IN DEXTROSE 1-5 GM/200ML-% IV SOLN
1000.0000 mg | INTRAVENOUS | Status: AC
  Administered 2020-12-10: 1000 mg via INTRAVENOUS
  Filled 2020-12-10: qty 200

## 2020-12-10 MED ORDER — SENNOSIDES-DOCUSATE SODIUM 8.6-50 MG PO TABS: 1.0000 | ORAL_TABLET | Freq: Every evening | ORAL | Status: DC | PRN

## 2020-12-10 MED ORDER — TIMOLOL MALEATE 0.5 % OP SOLN
1.0000 [drp] | Freq: Two times a day (BID) | OPHTHALMIC | Status: DC
  Administered 2020-12-10 – 2020-12-19 (×19): 1 [drp] via OPHTHALMIC
  Filled 2020-12-10: qty 5

## 2020-12-10 MED ORDER — DEXAMETHASONE 4 MG PO TABS
8.0000 mg | ORAL_TABLET | Freq: Two times a day (BID) | ORAL | Status: AC
  Administered 2020-12-10 – 2020-12-13 (×8): 8 mg via ORAL
  Filled 2020-12-10 (×8): qty 2

## 2020-12-10 MED ORDER — METRONIDAZOLE 500 MG/100ML IV SOLN
500.0000 mg | Freq: Two times a day (BID) | INTRAVENOUS | Status: DC
  Administered 2020-12-10 – 2020-12-12 (×5): 500 mg via INTRAVENOUS
  Filled 2020-12-10 (×5): qty 100

## 2020-12-10 MED ORDER — ONDANSETRON HCL 4 MG/2ML IJ SOLN: 4.0000 mg | Freq: Four times a day (QID) | INTRAMUSCULAR | Status: DC | PRN

## 2020-12-10 MED ORDER — DEXAMETHASONE 4 MG PO TABS
4.0000 mg | ORAL_TABLET | Freq: Every day | ORAL | Status: DC
  Administered 2020-12-14 – 2020-12-19 (×6): 4 mg via ORAL
  Filled 2020-12-10 (×6): qty 1

## 2020-12-10 MED ORDER — ALLOPURINOL 300 MG PO TABS
300.0000 mg | ORAL_TABLET | Freq: Every day | ORAL | Status: DC
  Administered 2020-12-10 – 2020-12-19 (×10): 300 mg via ORAL
  Filled 2020-12-10 (×10): qty 1

## 2020-12-10 NOTE — Progress Notes (Addendum)
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 Lorenso Courier, actively getting radiation), hyperlipidemia, hypertension who presents to [facility] with complaints of generalized malaise and fever of 102.53F at home.    On evaluation, patient has significant redness swelling and warmth with multiple ulcerations over the right lateral neck which is the site of frequent rounds of radiation therapy.  ER provider feels that this is likely the source of the patient's infection.  No obvious area of fluctuance on exam however he states that the anatomy is quite deformed. Please note that [place] is well under 500.  While the patient did have several noncontrast CT scans of the maxillofacial region and C-spine several days ago I have asked for a repeat CT is of the soft tissues of the neck to identify any definitive abscess formation.  No ultrasound available at this time.  1 L of IV fluids and intravenous cefepime initiated.  Per my discussion with ER provider intravenous vancomycin will also be initiated.  Medical telemetry bed requested at [facility] preferentially.  This patient has been accepted to the [facility] Hospitalist service.   Patient currently has a bed request order placed by the hospitalist provider.    While awaiting a bed, [facility] is available for medical guidance in management of this patient. However, please direct all requests for new orders to the ER provider while the patient remains at [facility]    Inda Merlin MD [facility]

## 2020-12-10 NOTE — Assessment & Plan Note (Addendum)
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 Lorenso Courier and Dr. Isidore Moos - s/p treatment with R-miniCHOP ending 10/11/20 and then started on palliative radiation on 10/30/20 - on allopurinol for TLS ppx; interaction with dilantin and levels being monitored outpatient (due to level check on 10/3) - continue acyclovir 400 mg BID for VCZ ppx - evaluated by rad onc on 9/26: started on decadron taper and gabapentin increased to 600 mg TID - if patient remains in hospital on Monday, he will be brought down to appt with Dr. Isidore Moos followed by CT sim - plan is also bone marrow biopsy on Monday

## 2020-12-10 NOTE — Assessment & Plan Note (Addendum)
-   has remained neutropenic since early September - luckily dramatic improvement in [place] today (10/2) with now [place] 5073 and WBC has improved to 9.3 today - give 1 last dose of Granix today then okay to discontinue - can d/c neutropenic precautions at this time - see sepsis

## 2020-12-10 NOTE — Assessment & Plan Note (Signed)
-   Baseline hemoglobin approximately 11 g/dL - Currently at baseline

## 2020-12-10 NOTE — Assessment & Plan Note (Addendum)
-   febrile, neutropenia. Source considered neck cellulitis -No growth on cultures.  MRSA screen negative.  Has received at least 48 hours of IV antibiotics. -De-escalate to Augmentin and complete course (total of 10 days likely adequate) - lactic normal - cortisol low, likely relative adrenal insufficiency; unable to perform ACTH stim test in setting of current steroid course

## 2020-12-10 NOTE — Assessment & Plan Note (Signed)
-  continue gabapentin

## 2020-12-10 NOTE — ED Provider Notes (Signed)
[facility] Emergency Department Provider Note MRN:  [id number]  Arrival date & time: 12/10/20     Chief Complaint   Fever   History of Present Illness   Brian Castillo is a 80 y.o. year-old male with a history of lymphoma presenting to the ED with chief complaint of fever.  Low-grade fever throughout the day yesterday, fever up to 102.6 this morning.  Denies chills.  Mild recent cough.  No chest pain or shortness of breath, no abdominal pain, no vomiting or diarrhea, no burning with urination.  Some increased warmth to right neck mass/wound but not significantly changed in appearance.  Review of Systems  A complete 10 system review of systems was obtained and all systems are negative except as noted in the HPI and PMH.   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   Seizures ([place])    last seizure was in 1988    Past Surgical History:  Procedure Laterality Date   CARPOMETACARPEL SUSPENSION PLASTY Right 08/24/2014   Procedure: RIGHT CMC ARTHOPLASTY WITH DOUBLE TENDON TRANSFER AND REPAIR  RECONSTRUCTION ;  Surgeon: Roseanne Kaufman, MD;  Location: [place];  Service: Orthopedics;  Laterality: Right;   CHOLECYSTECTOMY     COLONOSCOPY     IR IMAGING GUIDED PORT INSERTION  08/14/2020   IR RADIOLOGIST EVAL & MGMT  08/20/2020   KNEE ARTHROSCOPY  2003   bilateral   POLYPECTOMY      Family History  Problem Relation Age of Onset   Colon cancer Neg Hx    Colon polyps Neg Hx     Social History   Socioeconomic History   Marital status: Married    Spouse name: Not on file   Number of children: Not on file   Years of education: Not on file   Highest education  level: Not on file  Occupational History   Not on file  Tobacco Use   Smoking status: Former   Smokeless tobacco: Never   Tobacco comments:    quit 1984  Vaping Use   Vaping Use: Never used  Substance and Sexual Activity   Alcohol use: Yes    Alcohol/week: 6.0 standard drinks    Types: 6 Glasses of wine per week   Drug use: No   Sexual activity: Not on file  Other Topics Concern   Not on file  Social History Narrative   Not on file   Social Determinants of Health   Financial Resource Strain: Not on file  Food Insecurity: Not on file  Transportation Needs: Not on file  Physical Activity: Not on file  Stress: Not on file  Social Connections: Not on file  Intimate Partner Violence: Not on file     Physical Exam   Vitals:   12/10/20 0424 12/10/20 0430  BP: (!) 143/69 (!) 137/54  Pulse: 95 99  Resp: 16 16  Temp: 99.8 F (37.7 C)   SpO2: 96% 95%    CONSTITUTIONAL: Well-appearing, NAD NEURO:  Alert and oriented x 3, no focal deficits EYES:  eyes equal and  reactive ENT/NECK:  no LAD, no JVD CARDIO: Regular rate, well-perfused, normal S1 and S2 PULM:  CTAB no wheezing or rhonchi GI/GU:  normal bowel sounds, non-distended, non-tender MSK/SPINE:  No gross deformities, no edema SKIN: Large erythematous nodule with wounds located on the right lateral neck PSYCH:  Appropriate speech and behavior  *Additional and/or pertinent findings included in MDM below  Diagnostic and Interventional Summary    EKG Interpretation  Date/Time:  December 10, 2020 at 05: 28: 03 Ventricular Rate:  94 PR Interval:  162 QRS Duration: 96 QT Interval:  397 QTC Calculation: 474 R Axis:     Text Interpretation: Sinus rhythm, normal intervals Confirmed by Dr. Gerlene Fee at 5:56 AM       Labs Reviewed  CBC WITH DIFFERENTIAL/PLATELET - Abnormal; Notable for the following components:      Result Value   WBC 1.0 (*)    RBC 3.56 (*)    Hemoglobin 11.6 (*)    HCT 32.9 (*)     Neutro Abs 0.1 (*)    Lymphs Abs 0.2 (*)    All other components within normal limits  COMPREHENSIVE METABOLIC PANEL - Abnormal; Notable for the following components:   Glucose, Bld 131 (*)    AST 14 (*)    All other components within normal limits  CULTURE, BLOOD (ROUTINE X 2)  CULTURE, BLOOD (ROUTINE X 2)  RESP PANEL BY RT-PCR (FLU A&B, COVID) ARPGX2  URINALYSIS, ROUTINE W REFLEX MICROSCOPIC    DG Chest 2 View  Final Result      Medications  ceFEPIme (MAXIPIME) 2 g in sodium chloride 0.9 % 100 mL IVPB (2 g Intravenous New Bag/Given 12/10/20 0532)  sodium chloride 0.9 % bolus 1,000 mL (1,000 mLs Intravenous New Bag/Given 12/10/20 0537)     Procedures  /  Critical Care .Critical Care Performed by: Maudie Flakes, MD Authorized by: Maudie Flakes, MD   Critical care provider statement:    Critical care time (minutes):  36   Critical care was necessary to treat or prevent imminent or life-threatening deterioration of the following conditions: neutropenic fever.   Critical care was time spent personally by me on the following activities:  Discussions with consultants, evaluation of patient's response to treatment, examination of patient, ordering and performing treatments and interventions, ordering and review of laboratory studies, ordering and review of radiographic studies, pulse oximetry, re-evaluation of patient's condition, obtaining history from patient or surrogate and review of old charts  ED Course and Medical Decision Making  I have reviewed the triage vital signs, the nursing notes, and pertinent available records from the EMR.  Listed above are laboratory and imaging tests that I personally ordered, reviewed, and interpreted and then considered in my medical decision making (see below for details).  Concern for neutropenic fever, source likely from neck wound.  Patient underwent multiple rounds of chemotherapy for lymphoma, last chemotherapy session in July.  Since then  has been having multiple rounds of palliative radiation to a troublesome lymph node of the right neck.  The neck wound is quite erythematous and is the expected source of the fever, though patient and patient's wife deny any significant changes in its appearance, does seem a bit more warm.  Recent cough, so pneumonia also considered.  Providing empiric fluids, cefepime, awaiting labs, infectious work-up.  Suspect will need admission.  Patient reports recent [place] of 1.6.     Labs reveal ANC of 0.1.  Chest x-ray normal.  Patient remains very  stable, well-appearing.  Will admit to medicine.  Barth Kirks. [place]onia Small, [place] [email or web address]  Final Clinical Impressions(s) / ED Diagnoses     ICD-10-CM   1. Neutropenic fever ([place])  D70.9    R50.81       ED Discharge Orders     None        Discharge Instructions Discussed with and Provided to Patient:   Discharge Instructions   None       Maudie Flakes, MD 12/10/20 [phone]

## 2020-12-10 NOTE — Progress Notes (Addendum)
Pharmacy Antibiotic Note  Brian Castillo is a 80 y.o. male admitted on 12/10/2020 with  febrile neutropenia .  Pharmacy has been consulted for vanc dosing.  Pt with B-cell lymphoma who presented to the ED with malaise and fever. Pt has multiple ulcerations around neck. Pt got one dose of cefepime so far. Vanc is added to the cefepime. He has porta-cath in place.  Scr 0.7 Wbc 1>>ANC 0.1  Plan: Vanc 2g x1 then 1.75g IV q24>>AUC 499 scr 0.8 Cefepime 2g IV q8 Level as needed   Height: 5\' 8"  (172.7 cm) Weight: 78.9 kg (174 lb) IBW/kg (Calculated) : 68.4  Temp (24hrs), Avg:99.8 F (37.7 C), Min:99.8 F (37.7 C), Max:99.8 F (37.7 C)  Recent Labs  Lab 12/05/20 1357 12/06/20 1200 12/10/20 0456  WBC 1.6* 1.7* 1.0*  CREATININE 0.82  --  0.70    Estimated Creatinine Clearance: 68.9 mL/min (by C-G formula based on SCr of 0.7 mg/dL).    Allergies  Allergen Reactions   Brimonidine Tartrate-Timolol Other (See Comments)    Other reaction(s): Swelling in eyes   Lisinopril Hives and Other (See Comments)    Other reaction(s): swelling of lips (undetermined)   Netarsudil Dimesylate Other (See Comments)    Other reaction(s): Swelliing in eyes    Antimicrobials this admission: 9/27 cefepime>> 9/27 vanc>>  Dose adjustments this admission:   Microbiology results: 9/27 blood>>  Onnie Boer, PharmD, [place], AAHIVP, CPP Infectious Disease Pharmacist 12/10/2020 6:55 AM

## 2020-12-10 NOTE — Assessment & Plan Note (Signed)
-   continue amlodipine and toprol

## 2020-12-10 NOTE — Assessment & Plan Note (Signed)
-   2 new ground glass nodules in RUL noted on CT neck on 12/10/20  - see lymphoma

## 2020-12-10 NOTE — ED Triage Notes (Signed)
Patient here POV from Home with Fever.  Patient had Chemotherapy Session on Wednesday and had had Biopsy completed on Friday for Abscess on Right Face. Highest T: 102.6 today PTA.  Tylenol at 2125 yesterday. Ambulatory. NAD Noted during Triage. No CP. No SOB. GCS 15.

## 2020-12-10 NOTE — H&P (Signed)
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  Started on palliative radiation therapy on 10/30/20. Prior to this treated with R-miniCHOP which was stopped on 10/11/20 due to progression of disease.   He presented to [facility] due to fevers at home and generalized fatigue.  He stated that he completed approximately 5 weeks of radiation about 1 week ago.  He states that his neck mass was attempted to be aspirated prior to initiation of radiation however it did not yield fluid. He underwent a CT of the neck on evaluation.  This showed a 7 cm transfacial right neck mass with marked size progression and extensive necrosis with evidence of draining to the skin.  The right facial artery traverses the necrotic mass.  2 groundglass nodules in the RUL also seen.  He was started on vancomycin and cefepime.  He was transferred for ongoing IV antibiotics and further work-up.  I have personally briefly reviewed patient's old medical records in [facility] and discussed patient with the ER provider when appropriate/indicated.  Assessment/Plan: * Severe sepsis (HCC) - febrile, neutropenia. 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; follows with Brian Castillo - s/p treatment with R-miniCHOP ending 10/11/20 and then started on palliative radiation on 10/30/20 - on allopurinol for TLS ppx;  interaction with dilantin and levels being monitored outpatient (due to level check on 10/3) - continue acyclovir 400 mg BID for VCZ ppx - evaluated by rad onc on 9/26: started on decadron taper and gabapentin increased to 600 mg TID  Lung nodule - 2 new ground glass nodules in RUL noted on CT neck on 12/10/20  - see lymphoma   Seizure disorder ([place]) - Continue Dilantin - Due for Dilantin level check on 12/16/2020 - Given hospitalization, will go ahead and check level now as well  Normocytic anemia - Baseline hemoglobin approximately 11 g/dL - Currently at baseline  Bilateral occipital neuralgia - continue gabapentin   HTN (hypertension) - continue amlodipine and toprol    Code Status:     Code Status: Full Code  DVT Prophylaxis:   enoxaparin (LOVENOX) injection 40 mg Start: 12/11/20 1000   Anticipated disposition is to: home  History: Past Medical History:  Diagnosis Date   Allergy    mild   Arthritis    Cancer ([place])    Cataract    forming   Colon polyps    GERD (gastroesophageal reflux disease)    15 -20 years ago -none currently   Glaucoma    Hyperlipidemia    Hypertension    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 He has never used smokeless tobacco. He reports current alcohol use of about 6.0 standard drinks per week. He reports that he does not use drugs.  Allergies  Allergen Reactions    Brimonidine Tartrate-Timolol Other (See Comments)    Other reaction(s): Swelling in eyes   Lisinopril Hives and Other (See Comments)    Other reaction(s): swelling of lips (undetermined)   Netarsudil Dimesylate Other (See Comments)    Other reaction(s): Swelliing in eyes    Family History  Problem Relation Age of Onset   Colon cancer Neg Hx    Colon polyps Neg Hx    Home Medications: Prior to Admission medications   Medication Sig Start Date End Date Taking? Authorizing Provider  allopurinol (ZYLOPRIM) 300 MG tablet Take 300 mg by mouth daily.   Yes Provider, Historical, Castillo  amLODipine (NORVASC) 5 MG tablet Take 5 mg by mouth Daily.  11/28/11  Yes Provider, Historical, Castillo  aspirin 81 MG tablet Take 81 mg by mouth daily.   Yes Provider, Historical, Castillo  Calcium Carb-Cholecalciferol (CALCIUM 600 + D PO) Take 1 tablet by mouth daily.   Yes Provider, Historical, Castillo  cholecalciferol (VITAMIN D) 1000 units tablet Take 1,000 Units by mouth daily.   Yes Provider, Historical, Castillo  dexamethasone (DECADRON) 4 MG tablet Take 2 tablets BID through 9/30, then 1 tablet daily through 10/7, then 1/2 tablet daily through 10/14. Take with food. 12/09/20  Yes Brian Castillo  gabapentin (NEURONTIN) 300 MG capsule Take 2 capsules (600 mg total) by mouth 3 (three) times daily. Take 1 capsule (300 mg total) by mouth twice a day and 2 capsules (600mg ) at bedtime Patient taking differently: Take 600 mg by mouth 3 (three) times daily. 11/05/20  Yes Brian Castillo  HYDROmorphone (DILAUDID) 2 MG tablet Take 1 tablet (2 mg total) by mouth every 4 (four) hours as needed for severe pain. Patient taking differently: Take 2 mg by mouth at bedtime as needed for severe pain. 12/05/20  Yes Brian Castillo  Latanoprostene Bunod (VYZULTA) 0.024 % SOLN Place 1 drop into both eyes at bedtime.   Yes Provider, Historical, Castillo  lidocaine-prilocaine (EMLA) cream Apply 1 application topically as needed. Patient taking  differently: Apply 1 application topically daily as needed (port access). 08/28/20  Yes Brian Castillo  metoprolol succinate (TOPROL-XL) 25 MG 24 hr tablet Take 25 mg by mouth Daily.  11/28/11  Yes Provider, Historical, Castillo  phenytoin (DILANTIN) 100 MG ER capsule Take 200 mg by mouth 2 (two) times daily.   Yes Provider, Historical, Castillo  rosuvastatin (CRESTOR) 20 MG tablet Take 20 mg by mouth daily.   Yes Provider, Historical, Castillo  timolol (BETIMOL) 0.5 % ophthalmic solution Place 1 drop into both eyes 2 (two) times daily.   Yes Provider, Historical, Castillo  acyclovir (ZOVIRAX) 400 MG tablet Take 1 tablet (400 mg total) by mouth 2 (two) times daily. Patient not taking: No sig reported 05/29/20   Brian Castillo  cyclobenzaprine (FLEXERIL) 5 MG tablet Take 1 tablet (5 mg total) by mouth  3 (three) times daily as needed for muscle spasms. Patient not taking: No sig reported 09/09/20   Brian Castillo  lidocaine (XYLOCAINE) 2 % solution Use as directed 15 mLs in the mouth or throat every 3 (three) hours as needed for mouth pain. Swish, gargle and spit out every 3 hours as needed for mouth/throat  pain Patient not taking: No sig reported 12/05/20   Brian Castillo  oxyCODONE (OXY IR/ROXICODONE) 5 MG immediate release tablet Take 2-3 tablets (10-15 mg total) by mouth every 4 (four) hours as needed for severe pain. Patient not taking: No sig reported 10/24/20   Brian Castillo  senna-docusate (SENOKOT-S) 8.6-50 MG tablet Take 2 tablets by mouth at bedtime as needed for moderate constipation. Patient not taking: No sig reported 09/16/20   Damita Lack, Castillo    Review of Systems:  Pertinent items noted in HPI and remainder of comprehensive ROS otherwise negative.  Physical Exam: Vitals:   12/10/20 0731 12/10/20 0800 12/10/20 0808 12/10/20 0906  BP: (!) 153/59 137/67  (!) 147/69  Pulse: 91 89 89 84  Resp: 18 11 16 18   Temp:    81.1 F (37.6 C)  TempSrc:    Oral  SpO2: 97% 98%  96% 95%  Weight:      Height:       General appearance: alert, cooperative, and no distress Head: Normocephalic, without obvious abnormality, atraumatic Eyes:  EOMI Mouth: slight right mouth droop Neck: large ~5 cm hardened mass with multiple areas of skin necrosis and surrounding erythema and TTP; unable to express any purulent drainage with compression and no open drainage into oral cavity Lungs: clear to auscultation bilaterally Heart: regular rate and rhythm and S1, S2 normal Abdomen: normal findings: bowel sounds normal and soft, non-tender Extremities:  no edema Skin: mobility and turgor normal Neurologic: decreased V2/V3 sensation in right face; right mouth droop appreciated  Labs on Admission:  I have personally reviewed following labs and imaging studies Results for orders placed or performed during the hospital encounter of 12/10/20 (from the past 24 hour(s))  Culture, blood (x 2)     Status: None (Preliminary result)   Collection Time: 12/10/20  4:40 AM   Specimen: BLOOD RIGHT WRIST  Result Value Ref Range   Specimen Description BLOOD RIGHT WRIST    Special Requests      BOTTLES DRAWN AEROBIC AND ANAEROBIC Blood Culture adequate volume Performed at [facility], [address] [place], [place] [address]    Culture PENDING    Report Status PENDING   Culture, blood (x 2)     Status: None (Preliminary result)   Collection Time: 12/10/20  4:51 AM   Specimen: BLOOD LEFT WRIST  Result Value Ref Range   Specimen Description BLOOD LEFT WRIST    Special Requests      BOTTLES DRAWN AEROBIC AND ANAEROBIC Blood Culture adequate volume Performed at [facility], [place] [address] [place], [place] [address]    Culture PENDING    Report Status PENDING   CBC with Differential     Status: Abnormal   Collection Time: 12/10/20  4:56 AM  Result Value Ref Range   WBC 1.0 (LL) 4.0 - 10.5 K/uL   RBC 3.56 (L) 4.22 - 5.81 MIL/uL   Hemoglobin 11.6 (L) 13.0 - 17.0 g/dL   HCT  32.9 (L) 39.0 - 52.0 %   MCV 92.4 80.0 - 100.0 fL   MCH  32.6 26.0 - 34.0 pg   MCHC 35.3 30.0 - 36.0 g/dL   RDW 12.2 11.5 - 15.5 %   Platelets 181 150 - 400 K/uL   nRBC 0.0 0.0 - 0.2 %   Neutrophils Relative % 8 %   Neutro Abs 0.1 (LL) 1.7 - 7.7 K/uL   Lymphocytes Relative 24 %   Lymphs Abs 0.2 (L) 0.7 - 4.0 K/uL   Monocytes Relative 58 %   Monocytes Absolute 0.6 0.1 - 1.0 K/uL   Eosinophils Relative 4 %   Eosinophils Absolute 0.0 0.0 - 0.5 K/uL   Basophils Relative 1 %   Basophils Absolute 0.0 0.0 - 0.1 K/uL   RBC Morphology MORPHOLOGY UNREMARKABLE    Immature Granulocytes 5 %   Abs Immature Granulocytes 0.05 0.00 - 0.07 K/uL   Platelet Morphology NORMAL   Comprehensive metabolic panel     Status: Abnormal   Collection Time: 12/10/20  4:56 AM  Result Value Ref Range   Sodium 137 135 - 145 mmol/L   Potassium 3.7 3.5 - 5.1 mmol/L   Chloride 100 98 - 111 mmol/L   CO2 27 22 - 32 mmol/L   Glucose, Bld 131 (H) 70 - 99 mg/dL   BUN 12 8 - 23 mg/dL   Creatinine, Ser 0.70 0.61 - 1.24 mg/dL   Calcium 9.3 8.9 - 10.3 mg/dL   Total Protein 6.9 6.5 - 8.1 g/dL   Albumin 3.9 3.5 - 5.0 g/dL   AST 14 (L) 15 - 41 U/L   ALT 17 0 - 44 U/L   Alkaline Phosphatase 78 38 - 126 U/L   Total Bilirubin 0.3 0.3 - 1.2 mg/dL   GFR, Estimated >60 >60 mL/min   Anion gap 10 5 - 15  Resp Panel by RT-PCR (Flu A&B, Covid) Nasopharyngeal Swab     Status: None   Collection Time: 12/10/20  5:50 AM   Specimen: Nasopharyngeal Swab; Nasopharyngeal(NP) swabs in vial transport medium  Result Value Ref Range   SARS Coronavirus 2 by RT PCR NEGATIVE NEGATIVE   Influenza A by PCR NEGATIVE NEGATIVE   Influenza B by PCR NEGATIVE NEGATIVE     Radiological Exams on Admission: DG Chest 2 View  Result Date: 12/10/2020 CLINICAL DATA:  Cough and fever EXAM: CHEST - 2 VIEW COMPARISON:  06/25/2020 FINDINGS: Coarsened lung markings over the lower lobes on the lateral view with scar-like appearance by prior chest CT February  2022. There is no edema, consolidation, effusion, or pneumothorax. Normal heart size and mediastinal contours. Unremarkable porta catheter positioning IMPRESSION: No evidence of active disease. Electronically Signed   By: Jorje Guild M.D.   On: 12/10/2020 05:18   CT Soft Tissue Neck W Contrast  Result Date: 12/10/2020 CLINICAL DATA:  Lymphoma under treatment. Malaise and fever. Ulcerated right lateral neck, site of radiotherapy EXAM: CT NECK WITH CONTRAST TECHNIQUE: Multidetector CT imaging of the neck was performed using the standard protocol following the bolus administration of intravenous contrast. CONTRAST:  22mL OMNIPAQUE IOHEXOL 350 MG/ML SOLN COMPARISON:  09/11/2020 FINDINGS: Pharynx and larynx: No evidence of mass or thickening of Waldeyer's ring. Palatine tonsilliths Salivary glands: Right submandibular mass involving the parotid tail and submandibular glands, progressed in size, and necrotic with central low-density, gas, and opening to the skin surface. The mass measures up to 7 cm in span. Increased solid tumor invasion into the right parotid and right anterior masseter. The mass is closely associated with the right facial artery, without visible pseudoaneurysm. Thyroid: Normal Lymph  nodes: It is unclear if the right neck mass was initially a node. No other enlarged or heterogeneous nodes. Vascular: As above. Atheromatous calcifications and right IJ porta catheter. 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 Visualized orbits: Negative Mastoids and visualized paranasal sinuses: Clear Skeleton: Patient's mass is in continuity with the right mandibular body without asymmetric bone loss or erosion. Upper chest: 2 new ground-glass nodules in the right upper lobe measuring 1 cm. IMPRESSION: 1. ~7 cm trans spatial right neck mass with marked size progression from June 2020 and extensive necrosis with evidence of draining to the skin. The right facial artery traverses  the necrotic mass. 2. Two new ground-glass nodules in the right upper lobe which could be lymphomatous. Electronically Signed   By: Jorje Guild M.D.   On: 12/10/2020 07:39   CT Soft Tissue Neck W Contrast  Final Result    DG Chest 2 View  Final Result      Consults called:  ENT   EKG: Independently reviewed. NSR, QTc 474   Dwyane Dee, Castillo [facility] 12/10/2020, 10:29 AM

## 2020-12-10 NOTE — Assessment & Plan Note (Addendum)
-   Continue Dilantin - levels checked on 9/27: slightly low (free 0.6, total 7.3)

## 2020-12-10 NOTE — Hospital Course (Addendum)
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 Lorenso Courier), HLD, HTN.  Started on palliative radiation therapy on 10/30/20. Prior to this treated with R-miniCHOP which was stopped on 10/11/20 due to progression of disease.   He presented to [facility] due to fevers at home and generalized fatigue.  He stated that he completed approximately 5 weeks of radiation about 1 week ago.  He states that his neck mass was attempted to be aspirated prior to initiation of radiation however it did not yield fluid. He underwent a CT of the neck on evaluation.  This showed a 7 cm transfacial right neck mass with marked size progression and extensive necrosis with evidence of draining to the skin.  The right facial artery traverses the necrotic mass.  2 groundglass nodules in the RUL also seen.  He was started on vancomycin and cefepime initially.  He was transferred for ongoing IV antibiotics and further work-up.

## 2020-12-10 NOTE — Progress Notes (Signed)
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 Lorenso Courier  4) 02/22/2020: PET CT scan showed Deauville 4 and Deauville 5 adenopathy bilaterally in the neck. There is also a Deauville 4 left supraclavicular lymph node and a Deauville 4 right gastric lymph node 5) 04/24/2020: presents with rapid enlargement of his right submandibular lymph node.  6) 05/20/2020: partial excision of right submandibular lymph node with attempted fluid drainage. Pathology consistent with follicular lymphoma, no clear evidence of transformation.  7) 06/12/2020: Cycle 1 Day 1 of R-Benda. 8) 07/10/2020: Cycle 2 Day 1 of R-Benda. 9) 08/07/2020: Cycle 3 Day 1 of R-Benda. Noted to have enlargement of right cervical lymph node on exam.  10) 08/28/2020: Cycle 1 Day 1 of R-miniCHOP 11) 09/20/2020: Cycle 2 Day 2 of R-miniCHOP 12) 10/11/2020: Cycle 3 of R-miniCHOP HELD due to progression. 13) 10/30/2020: start of palliative radiation therapy  Interval History:  Brian Castillo 80 y.o. 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 The patient's last visit was on 11/21/2020. In the interim since the last visit he has continued palliative radiation therapy to the problematic lymph node.    On exam today Brian Castillo notes he has had moderately high temperatures in the interim since her last visit.  He notes that they can reach 98 to 53 F, but no frank fever.  He is having some erythema and drainage from ulcers that have developed on his right-sided lymph node.  Fortunately his pain is under good control at this time and he mostly uses Dilaudid to keep the pain under control so he can sleep.  He unfortunately has had some issues with what appears to be progression on his right cheek for which Dr. Isidore Moos has arranged a biopsy.  He is also beginning to have some pain while attempting to swallow.  He reports that he is not having any issues with fevers, chills, sweats, nausea, vomiting or diarrhea.  A full 10 point ROS is listed below.  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   Seizures ([place])    last seizure was in 1988    SURGICAL HISTORY: Past Surgical History:  Procedure Laterality Date   CARPOMETACARPEL SUSPENSION PLASTY Right 08/24/2014   Procedure: RIGHT CMC ARTHOPLASTY WITH DOUBLE TENDON TRANSFER AND REPAIR  RECONSTRUCTION ;  Surgeon: Roseanne Kaufman, MD;  Location: [facility];  Service: Orthopedics;  Laterality: Right;   CHOLECYSTECTOMY     COLONOSCOPY  IR IMAGING GUIDED PORT INSERTION  08/14/2020   IR RADIOLOGIST EVAL & MGMT  08/20/2020   KNEE ARTHROSCOPY  2003   bilateral   POLYPECTOMY      SOCIAL HISTORY: Social History   Socioeconomic History   Marital status: Married    Spouse name: Not on file   Number of children: Not on file   Years of education: Not on file   Highest education level: Not on file  Occupational History   Not on file  Tobacco Use   Smoking status:  Former   Smokeless tobacco: Never   Tobacco comments:    quit 1984  Vaping Use   Vaping Use: Never used  Substance and Sexual Activity   Alcohol use: Yes    Alcohol/week: 6.0 standard drinks    Types: 6 Glasses of wine per week   Drug use: No   Sexual activity: Not on file  Other Topics Concern   Not on file  Social History Narrative   Not on file   Social Determinants of Health   Financial Resource Strain: Not on file  Food Insecurity: Not on file  Transportation Needs: Not on file  Physical Activity: Not on file  Stress: Not on file  Social Connections: Not on file  Intimate Partner Violence: Not on file    FAMILY HISTORY: Family History  Problem Relation Age of Onset   Colon cancer Neg Hx    Colon polyps Neg Hx     ALLERGIES:  is allergic to brimonidine tartrate-timolol, lisinopril, and netarsudil dimesylate.  MEDICATIONS:  No current facility-administered medications for this visit.   Current Outpatient Medications  Medication Sig Dispense Refill   lidocaine (XYLOCAINE) 2 % solution Use as directed 15 mLs in the mouth or throat every 3 (three) hours as needed for mouth pain. Swish, gargle and spit out every 3 hours as needed for mouth/throat  pain 100 mL 1   acyclovir (ZOVIRAX) 400 MG tablet Take 1 tablet (400 mg total) by mouth 2 (two) times daily. (Patient not taking: No sig reported) 60 tablet 5   allopurinol (ZYLOPRIM) 300 MG tablet Take 300 mg by mouth daily.     amLODipine (NORVASC) 5 MG tablet Take 5 mg by mouth Daily.      aspirin 81 MG tablet Take 81 mg by mouth daily.     Calcium Carb-Cholecalciferol (CALCIUM 600 + D PO) Take 1 tablet by mouth daily.     cholecalciferol (VITAMIN D) 1000 units tablet Take 1,000 Units by mouth daily.     cyclobenzaprine (FLEXERIL) 5 MG tablet Take 1 tablet (5 mg total) by mouth 3 (three) times daily as needed for muscle spasms. (Patient not taking: Reported on 11/05/2020) 30 tablet 0   dexamethasone (DECADRON) 4 MG tablet  Take 2 tablets BID through 9/30, then 1 tablet daily through 10/7, then 1/2 tablet daily through 10/14. Take with food. 19 tablet 0   gabapentin (NEURONTIN) 300 MG capsule Take 2 capsules (600 mg total) by mouth 3 (three) times daily. Take 1 capsule (300 mg total) by mouth twice a day and 2 capsules (600mg ) at bedtime 180 capsule 1   HYDROmorphone (DILAUDID) 2 MG tablet Take 1 tablet (2 mg total) by mouth every 4 (four) hours as needed for severe pain. 30 tablet 0   ibuprofen (ADVIL) 200 MG tablet Take 400 mg by mouth every 6 (six) hours as needed for mild pain.     Latanoprostene Bunod (VYZULTA) 0.024 % SOLN Place 1 drop  into both eyes at bedtime.     lidocaine-prilocaine (EMLA) cream Apply 1 application topically as needed. (Patient taking differently: Apply 1 application topically daily as needed (port access).) 30 g 0   metoprolol succinate (TOPROL-XL) 25 MG 24 hr tablet Take 25 mg by mouth Daily.      oxyCODONE (OXY IR/ROXICODONE) 5 MG immediate release tablet Take 2-3 tablets (10-15 mg total) by mouth every 4 (four) hours as needed for severe pain. (Patient not taking: Reported on 11/05/2020) 120 tablet 0   phenytoin (DILANTIN) 100 MG ER capsule Take 200 mg by mouth 2 (two) times daily.     rosuvastatin (CRESTOR) 20 MG tablet Take 20 mg by mouth daily.     senna-docusate (SENOKOT-S) 8.6-50 MG tablet Take 2 tablets by mouth at bedtime as needed for moderate constipation. (Patient not taking: Reported on 11/05/2020) 60 tablet 0   timolol (BETIMOL) 0.5 % ophthalmic solution Place 1 drop into both eyes 2 (two) times daily.     Facility-Administered Medications Ordered in Other Visits  Medication Dose Route Frequency Provider Last Rate Last Admin   ceFEPIme (MAXIPIME) 2 g in sodium chloride 0.9 % 100 mL IVPB  2 g Intravenous Q8H Pham, Minh Q, RPH-CPP       vancomycin (VANCOCIN) IVPB 1000 mg/200 mL premix  1,000 mg Intravenous Q1 Hr x 2 Pham, Minh Q, RPH-CPP 200 mL/hr at 12/10/20 0759 1,000 mg at  12/10/20 0759   Followed by   vancomycin (VANCOREADY) IVPB 1750 mg/350 mL  1,750 mg Intravenous Q24H Pham, Minh Q, RPH-CPP        REVIEW OF SYSTEMS:   Constitutional: ( - ) fevers, ( - )  chills , ( - ) night sweats Eyes: ( - ) blurriness of vision, ( - ) double vision, ( - ) watery eyes Ears, nose, mouth, throat, and face: ( - ) mucositis, ( - ) sore throat Respiratory: ( - ) cough, ( - ) dyspnea, ( - ) wheezes Cardiovascular: ( - ) palpitation, ( - ) chest discomfort, ( - ) lower extremity swelling Gastrointestinal:  ( - ) nausea, ( - ) heartburn, ( - ) change in bowel habits Skin: ( - ) abnormal skin rashes Lymphatics: ( - ) new lymphadenopathy, ( - ) easy bruising Neurological: ( - ) numbness, ( - ) tingling, ( - ) new weaknesses Behavioral/Psych: ( - ) mood change, ( - ) new changes  All other systems were reviewed with the patient and are negative.  PHYSICAL EXAMINATION: ECOG PERFORMANCE STATUS: 1 - Symptomatic but completely ambulatory  Vitals:   12/05/20 1410  BP: 133/66  Pulse: 72  Resp: 18  Temp: 97.7 F (36.5 C)  SpO2: 100%    Filed Weights   12/05/20 1410  Weight: 172 lb 14.4 oz (78.4 kg)     GENERAL: well appearing elderly Caucasian male in NAD  SKIN: skin color, texture, turgor are normal, no rashes or significant lesions EYES: conjunctiva are pink and non-injected, sclera clear NECK: supple, non-tender LYMPH:  Single palpable right sided submandibular lymph node. Size is steady, though 3 ulcers have developed that drain. Otherwise no other marked palpable lymphadenopathy in the cervical, axillary or supraclavicular lymph nodes.  LUNGS: clear to auscultation and percussion with normal breathing effort HEART: regular rate & rhythm and no murmurs and no lower extremity edema Musculoskeletal: no cyanosis of digits and no clubbing  PSYCH: alert & oriented x 3, fluent speech NEURO: no focal motor/sensory deficits  LABORATORY DATA:  I have reviewed the data as  listed CBC Latest Ref Rng & Units 12/10/2020 12/06/2020 12/05/2020  WBC 4.0 - 10.5 K/uL 1.0(LL) 1.7(L) 1.6(L)  Hemoglobin 13.0 - 17.0 g/dL 11.6(L) 11.5(L) 12.1(L)  Hematocrit 39.0 - 52.0 % 32.9(L) 32.9(L) 35.2(L)  Platelets 150 - 400 K/uL 181 163 173    CMP Latest Ref Rng & Units 12/10/2020 12/05/2020 11/21/2020  Glucose 70 - 99 mg/dL 131(H) 102(H) 96  BUN 8 - 23 mg/dL 12 16 17   Creatinine 0.61 - 1.24 mg/dL 0.70 0.82 0.78  Sodium 135 - 145 mmol/L 137 137 140  Potassium 3.5 - 5.1 mmol/L 3.7 4.2 4.4  Chloride 98 - 111 mmol/L 100 101 104  CO2 22 - 32 mmol/L 27 26 26   Calcium 8.9 - 10.3 mg/dL 9.3 9.5 9.2  Total Protein 6.5 - 8.1 g/dL 6.9 7.5 6.8  Total Bilirubin 0.3 - 1.2 mg/dL 0.3 0.3 0.3  Alkaline Phos 38 - 126 U/L 78 128(H) 140(H)  AST 15 - 41 U/L 14(L) 20 17  ALT 0 - 44 U/L 17 21 14    RADIOGRAPHIC STUDIES: I have personally reviewed the radiological images as listed and agreed with the findings in the report: stable lymphadenopathy with exception of the rapidly enlarging submandibular lymph node.   DG Chest 2 View  Result Date: 12/10/2020 CLINICAL DATA:  Cough and fever EXAM: CHEST - 2 VIEW COMPARISON:  06/25/2020 FINDINGS: Coarsened lung markings over the lower lobes on the lateral view with scar-like appearance by prior chest CT February 2022. There is no edema, consolidation, effusion, or pneumothorax. Normal heart size and mediastinal contours. Unremarkable porta catheter positioning IMPRESSION: No evidence of active disease. Electronically Signed   By: Jorje Guild M.D.   On: 12/10/2020 05:18   DG Wrist Complete Left  Result Date: 12/05/2020 CLINICAL DATA:  Fall. EXAM: LEFT HAND - COMPLETE 3+ VIEW; LEFT WRIST - COMPLETE 3+ VIEW COMPARISON:  None. FINDINGS: No acute fracture or dislocation. Prior trapeziiectomy. Mild osteoarthritis of the DIP joints and thumb IP joint. Bone mineralization is normal. Soft tissues are unremarkable. IMPRESSION: 1. No acute osseous abnormality.  Electronically Signed   By: Titus Dubin M.D.   On: 12/05/2020 18:19   CT Head Wo Contrast  Result Date: 12/05/2020 CLINICAL DATA:  Recent fall with left facial trauma and pain, initial encounter EXAM: CT HEAD WITHOUT CONTRAST CT MAXILLOFACIAL WITHOUT CONTRAST CT CERVICAL SPINE WITHOUT CONTRAST TECHNIQUE: Multidetector CT imaging of the head, cervical spine, and maxillofacial structures were performed using the standard protocol without intravenous contrast. Multiplanar CT image reconstructions of the cervical spine and maxillofacial structures were also generated. COMPARISON:  09/11/2020 FINDINGS: CT HEAD FINDINGS Brain: No evidence of acute infarction, hemorrhage, hydrocephalus, extra-axial collection or mass lesion/mass effect. Mild atrophic changes are noted. Vascular: No hyperdense vessel or unexpected calcification. Skull: Normal. Negative for fracture or focal lesion. Other: None. CT MAXILLOFACIAL FINDINGS Osseous: Mildly downward displaced distal nasal bone fracture is noted best seen on image number 48 of series 9. This is noted centrally at the bridge of the nose. No other fractures are seen. No other bony abnormality is noted. Orbits: The orbits and their contents are within normal limits. No muscular abnormality is noted. Sinuses: Paranasal sinuses are well aerated. No mucosal abnormality is seen. Soft tissues: Surrounding soft tissue structures again show an enlarged soft tissue mass lesion similar to that seen on prior CT of the neck. It demonstrates changes of central necrosis as well as some air within which may be  related to recent biopsy. Clinical correlation is recommended. This lesion now measures 6.3 x 4.8 cm which is increased from the prior exam at which time it measured 4.9 x 3.3 cm. Some surrounding soft tissue edema is noted. Some extension along the masseter muscle on the right is noted she was not appreciated on the prior exam. To soft tissue foci are seen. One of these measures  approximately 15 mm and the second approximally 18 mm. Mild soft tissue swelling in the forehead is noted. CT CERVICAL SPINE FINDINGS Alignment: Within normal limits. Skull base and vertebrae: Cervical segments are well visualized. Vertebral body height is well maintained. Disc space narrowing is noted from C4-C7 with mild associated osteophytic changes. Multilevel facet hypertrophic changes are seen. No acute fracture or acute facet abnormality is noted. Neural foraminal narrowing is noted bilaterally. Soft tissues and spinal canal: Surrounding soft tissue structures again show a mass lesion in the right lateral neck increased in size from the prior study in June of 2022 with some central necrosis and air identified. Upper chest: Visualized lung apices are within normal limits. Other: None IMPRESSION: CT of the head: Mild atrophic changes without acute intracranial abnormality. CT of the maxillofacial bones: Acute nasal bone fracture involving the bridge distally. No other bony abnormality is seen. Soft tissue mass lesion in the right lateral neck similar to this seen on prior CT examination from June of 2022 although increased in size as described. Some additional soft tissue components extending along the masseter muscle on the right are noted new from the prior exam. This is consistent with the given clinical history of lymphoma. Mild left forehead hematoma is seen. CT of the cervical spine: Multilevel degenerative changes without acute abnormality. Electronically Signed   By: Inez Catalina M.D.   On: 12/05/2020 17:23   CT Soft Tissue Neck W Contrast  Result Date: 12/10/2020 CLINICAL DATA:  Lymphoma under treatment. Malaise and fever. Ulcerated right lateral neck, site of radiotherapy EXAM: CT NECK WITH CONTRAST TECHNIQUE: Multidetector CT imaging of the neck was performed using the standard protocol following the bolus administration of intravenous contrast. CONTRAST:  37mL OMNIPAQUE IOHEXOL 350 MG/ML SOLN  COMPARISON:  09/11/2020 FINDINGS: Pharynx and larynx: No evidence of mass or thickening of Waldeyer's ring. Palatine tonsilliths Salivary glands: Right submandibular mass involving the parotid tail and submandibular glands, progressed in size, and necrotic with central low-density, gas, and opening to the skin surface. The mass measures up to 7 cm in span. Increased solid tumor invasion into the right parotid and right anterior masseter. The mass is closely associated with the right facial artery, without visible pseudoaneurysm. Thyroid: Normal Lymph nodes: It is unclear if the right neck mass was initially a node. No other enlarged or heterogeneous nodes. Vascular: As above. Atheromatous calcifications and right IJ porta catheter. 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 Visualized orbits: Negative Mastoids and visualized paranasal sinuses: Clear Skeleton: Patient's mass is in continuity with the right mandibular body without asymmetric bone loss or erosion. Upper chest: 2 new ground-glass nodules in the right upper lobe measuring 1 cm. IMPRESSION: 1. ~7 cm trans spatial right neck mass with marked size progression from June 2020 and extensive necrosis with evidence of draining to the skin. The right facial artery traverses the necrotic mass. 2. Two new ground-glass nodules in the right upper lobe which could be lymphomatous. Electronically Signed   By: Jorje Guild M.D.   On: 12/10/2020 07:39   CT Cervical  Spine Wo Contrast  Result Date: 12/05/2020 CLINICAL DATA:  Recent fall with left facial trauma and pain, initial encounter EXAM: CT HEAD WITHOUT CONTRAST CT MAXILLOFACIAL WITHOUT CONTRAST CT CERVICAL SPINE WITHOUT CONTRAST TECHNIQUE: Multidetector CT imaging of the head, cervical spine, and maxillofacial structures were performed using the standard protocol without intravenous contrast. Multiplanar CT image reconstructions of the cervical spine and maxillofacial  structures were also generated. COMPARISON:  09/11/2020 FINDINGS: CT HEAD FINDINGS Brain: No evidence of acute infarction, hemorrhage, hydrocephalus, extra-axial collection or mass lesion/mass effect. Mild atrophic changes are noted. Vascular: No hyperdense vessel or unexpected calcification. Skull: Normal. Negative for fracture or focal lesion. Other: None. CT MAXILLOFACIAL FINDINGS Osseous: Mildly downward displaced distal nasal bone fracture is noted best seen on image number 48 of series 9. This is noted centrally at the bridge of the nose. No other fractures are seen. No other bony abnormality is noted. Orbits: The orbits and their contents are within normal limits. No muscular abnormality is noted. Sinuses: Paranasal sinuses are well aerated. No mucosal abnormality is seen. Soft tissues: Surrounding soft tissue structures again show an enlarged soft tissue mass lesion similar to that seen on prior CT of the neck. It demonstrates changes of central necrosis as well as some air within which may be related to recent biopsy. Clinical correlation is recommended. This lesion now measures 6.3 x 4.8 cm which is increased from the prior exam at which time it measured 4.9 x 3.3 cm. Some surrounding soft tissue edema is noted. Some extension along the masseter muscle on the right is noted she was not appreciated on the prior exam. To soft tissue foci are seen. One of these measures approximately 15 mm and the second approximally 18 mm. Mild soft tissue swelling in the forehead is noted. CT CERVICAL SPINE FINDINGS Alignment: Within normal limits. Skull base and vertebrae: Cervical segments are well visualized. Vertebral body height is well maintained. Disc space narrowing is noted from C4-C7 with mild associated osteophytic changes. Multilevel facet hypertrophic changes are seen. No acute fracture or acute facet abnormality is noted. Neural foraminal narrowing is noted bilaterally. Soft tissues and spinal canal:  Surrounding soft tissue structures again show a mass lesion in the right lateral neck increased in size from the prior study in June of 2022 with some central necrosis and air identified. Upper chest: Visualized lung apices are within normal limits. Other: None IMPRESSION: CT of the head: Mild atrophic changes without acute intracranial abnormality. CT of the maxillofacial bones: Acute nasal bone fracture involving the bridge distally. No other bony abnormality is seen. Soft tissue mass lesion in the right lateral neck similar to this seen on prior CT examination from June of 2022 although increased in size as described. Some additional soft tissue components extending along the masseter muscle on the right are noted new from the prior exam. This is consistent with the given clinical history of lymphoma. Mild left forehead hematoma is seen. CT of the cervical spine: Multilevel degenerative changes without acute abnormality. Electronically Signed   By: Inez Catalina M.D.   On: 12/05/2020 17:23   DG Knee Complete 4 Views Left  Result Date: 12/05/2020 CLINICAL DATA:  Fall. EXAM: LEFT KNEE - COMPLETE 4+ VIEW COMPARISON:  None. FINDINGS: No acute fracture or dislocation. No joint effusion. Mild medial compartment joint space narrowing. Bone mineralization is normal. Soft tissues are unremarkable. IMPRESSION: 1. No acute osseous abnormality. 2. Mild medial compartment osteoarthritis. Electronically Signed   By: Orville Govern.D.  On: 12/05/2020 18:16   DG Hand Complete Left  Result Date: 12/05/2020 CLINICAL DATA:  Fall. EXAM: LEFT HAND - COMPLETE 3+ VIEW; LEFT WRIST - COMPLETE 3+ VIEW COMPARISON:  None. FINDINGS: No acute fracture or dislocation. Prior trapeziiectomy. Mild osteoarthritis of the DIP joints and thumb IP joint. Bone mineralization is normal. Soft tissues are unremarkable. IMPRESSION: 1. No acute osseous abnormality. Electronically Signed   By: Titus Dubin M.D.   On: 12/05/2020 18:19   [place]  CORE BIOPSY (SALIVARY GLAND/PAROTID GLAND)  Result Date: 12/06/2020 INDICATION: 80 year old with a large right neck mass and follicular lymphoma. Patient has new lesions on the right side of the face. EXAM: Ultrasound-guided core biopsy of right parotid gland lesion MEDICATIONS: Moderate sedation ANESTHESIA/SEDATION: Moderate (conscious) sedation was employed during this procedure. A total of Versed 1.0 mg and Fentanyl 50 mcg was administered intravenously. Moderate Sedation Time: 15 minutes. The patient's level of consciousness and vital signs were monitored continuously by radiology nursing throughout the procedure under my direct supervision. FLUOROSCOPY TIME:  None COMPLICATIONS: None immediate. PROCEDURE: Informed written consent was obtained from the patient after a thorough discussion of the procedural risks, benefits and alternatives. All questions were addressed. A timeout was performed prior to the initiation of the procedure. Patient has visible and palpable lesions on the right side of the face above the right neck mass. This area was evaluated with ultrasound. Two nodules in this area correspond with the recent CT imaging. Right side of the face was prepped with chlorhexidine and sterile field was created. Skin and soft tissues were anesthetized using 1% lidocaine. A tiny incision was made on the right side of the neck. Using ultrasound guidance, a total of 5 core biopsies were obtained with an 18 gauge [place] device in the right parotid lesion. Specimens placed in saline. Bandage placed over the puncture site. FINDINGS: Large mass in the right upper neck. There are 2 visible nodules on the right side of the face anterior to the right ear. These correspond with nodules on the recent CT imaging. There is 1 nodule within the anterior right parotid gland that measures 1.5 x 0.9 x 1.5 cm. This nodule was biopsied. There is a larger nodule anterior to the right parotid nodule that is more hypoechoic  and heterogeneous. This nodule is involving the right masseter muscle based on the CT and is contiguous with the larger right neck mass based on sonography. Morphology of these new right facial nodules are similar to the larger right neck mass. IMPRESSION: Ultrasound-guided core biopsies of the right parotid lesion. Electronically Signed   By: Markus Daft M.D.   On: 12/06/2020 15:35   CT Maxillofacial WO CM  Result Date: 12/05/2020 CLINICAL DATA:  Recent fall with left facial trauma and pain, initial encounter EXAM: CT HEAD WITHOUT CONTRAST CT MAXILLOFACIAL WITHOUT CONTRAST CT CERVICAL SPINE WITHOUT CONTRAST TECHNIQUE: Multidetector CT imaging of the head, cervical spine, and maxillofacial structures were performed using the standard protocol without intravenous contrast. Multiplanar CT image reconstructions of the cervical spine and maxillofacial structures were also generated. COMPARISON:  09/11/2020 FINDINGS: CT HEAD FINDINGS Brain: No evidence of acute infarction, hemorrhage, hydrocephalus, extra-axial collection or mass lesion/mass effect. Mild atrophic changes are noted. Vascular: No hyperdense vessel or unexpected calcification. Skull: Normal. Negative for fracture or focal lesion. Other: None. CT MAXILLOFACIAL FINDINGS Osseous: Mildly downward displaced distal nasal bone fracture is noted best seen on image number 48 of series 9. This is noted centrally at the bridge of  the nose. No other fractures are seen. No other bony abnormality is noted. Orbits: The orbits and their contents are within normal limits. No muscular abnormality is noted. Sinuses: Paranasal sinuses are well aerated. No mucosal abnormality is seen. Soft tissues: Surrounding soft tissue structures again show an enlarged soft tissue mass lesion similar to that seen on prior CT of the neck. It demonstrates changes of central necrosis as well as some air within which may be related to recent biopsy. Clinical correlation is recommended. This  lesion now measures 6.3 x 4.8 cm which is increased from the prior exam at which time it measured 4.9 x 3.3 cm. Some surrounding soft tissue edema is noted. Some extension along the masseter muscle on the right is noted she was not appreciated on the prior exam. To soft tissue foci are seen. One of these measures approximately 15 mm and the second approximally 18 mm. Mild soft tissue swelling in the forehead is noted. CT CERVICAL SPINE FINDINGS Alignment: Within normal limits. Skull base and vertebrae: Cervical segments are well visualized. Vertebral body height is well maintained. Disc space narrowing is noted from C4-C7 with mild associated osteophytic changes. Multilevel facet hypertrophic changes are seen. No acute fracture or acute facet abnormality is noted. Neural foraminal narrowing is noted bilaterally. Soft tissues and spinal canal: Surrounding soft tissue structures again show a mass lesion in the right lateral neck increased in size from the prior study in June of 2022 with some central necrosis and air identified. Upper chest: Visualized lung apices are within normal limits. Other: None IMPRESSION: CT of the head: Mild atrophic changes without acute intracranial abnormality. CT of the maxillofacial bones: Acute nasal bone fracture involving the bridge distally. No other bony abnormality is seen. Soft tissue mass lesion in the right lateral neck similar to this seen on prior CT examination from June of 2022 although increased in size as described. Some additional soft tissue components extending along the masseter muscle on the right are noted new from the prior exam. This is consistent with the given clinical history of lymphoma. Mild left forehead hematoma is seen. CT of the cervical spine: Multilevel degenerative changes without acute abnormality. Electronically Signed   By: Inez Catalina M.D.   On: 12/05/2020 17:23    ASSESSMENT & PLAN Brian Castillo 80 y.o. 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 The patient's last visit was on 02/12/2020 at which time he establish care.  After review the labs, the records, discussion with the patient the findings are most consistent with a stage III follicular lymphoma based on the PET CT scan.  There are lymph nodes on both sides of diaphragm and therefore stage III would be the most appropriate designation.    Previously we discussed the nature of follicular lymphoma and the treatment options moving forward.  The options at this time would be observation, immunotherapy, or combination of chemotherapy and immunotherapy.  Given the lack of symptoms, cytopenias, and bulky disease at the time of presentation we decided to proceed with observation alone. Unfortunately due to rapid progression we need to consider treatment moving forward.  Additionally we discussed rituximab monotherapy with weekly dosing x4 doses followed by every 2 months dosing x4 as a treatment option as well as bendamustine and rituximab on days 1 and 2 of a 28-day cycle x6.  We discussed the side effects, risks and benefits, and possible outcomes of treatment.  The patient voiced  his understanding of these options and wished to proceed with R-Benda.   Over the period of one month the patient was found to have a rapidly enlarging right submandibular lymph node.  Due to concern for this enlarging lymph node we ordered a CT scan of the chest abdomen pelvis to assess for progression elsewhere, but that was the only lymph node that was progressing. 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   On PET CT scan from 08/23/2020 the patient showed complete response of disease with every site with the exception of his right cervical lymph node which is increasing in size and brightness.  As such we have to discontinue first-line therapy with rituximab and Bendamustine and transition over to second line R  mini CHOP.  R mini CHOP chemotherapy Cycle 1 Day1 started on 08/28/2020.  Unfortunately when he came time to proceed to cycle 3 it was evident that the lymph node in the neck had started to grow again rapidly.  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  We are currently planning for 2nd line therapy with R-miniCHOP(which would also be effective if patient is having transformation of the tumor).  -- patient completed PET, Port, and TTE prior to start of R-miniCHOP. --prior PET CT findings are consistent with a Stage III follicular lymphoma. Due to rapidly enlarging submandibular lymph node we proceeded with treatment.  --imaging shows stable disease save for a single enlarging submandibular lymph node.  Prior biopsy has shown transformation into a more aggressive lymphoma.  --started Cycle 1 Day 1 of R-Benda chemotherapy on 06/12/2020 --completed Cycle 3 Day of R-Benda. Unfortunately he had progression for which we changed his therapy to R-miniCHOP.  --08/28/2020 was Day 1 of Cycle 1 of R-miniCHOP. Prior to start of Cycle 3 Day 1 he was noted to have increased size of the lymph node.  Plan: -- ENT notes lesion is too large for excisional biopsy at this time --patient is undergoing palliative radiation with Dr. Isidore Moos --The next chemotherapy line would be gemcitabine and oxaliplatin.  My preference would be to complete palliative radiation oncology before starting this regimen. --due to enlarging areas on his right cheek (outside of the radiation field) he will be getting a repeat biopsy.  --RTC in 2 weeks for continued monitoring after completion of radiation  #Neutropenia, acute --unclear if this represents a lab error or genuine drop in [facility] --patient no currently on chemotherapy, unclear what provoked this.  --gave patient neutropenic precautions --will recheck labs in 1  -2 weeks to assess for resolution.   #Pain Control, improved --patient is having severe pain in his midline neck up to his scalp --extensive workup including CT/MRI scans of head/neck and LP have revealed no clear etiology for his findings --pain is responding well to gabapentin 300 BID with flexeril and dilaudid at night.  --oxycodone to 15 mg PO q 4H PRN with ibuprofen 600mg  Q6H PRN. (Not currently taking this) --Dilaudid 2 mg p.o. at night.  --Continue to monitor  #Medication Interaction --Allopurinol increases the levels of Dilantin. --We will work with the patient's primary care provider Dr. Shon Baton to monitor his Dilantin levels and adjust accordingly. For now we are following this weekly.  #Fever, resolved --Single episode of fever up to 101 F occurred on 06/25/2020. --Patient was not neutropenic at that time and was evaluated emergency  department with infectious work-up including chest x-ray, blood cultures, and urine culture.  Data at that time showed no evidence of infectious disease. --No further fevers noted since that time.  We will continue to monitor.  #Supportive Care --chemotherapy education completed --zofran 8mg  q8H PRN and compazine 10mg  PO q6H for nausea --acyclovir 400mg  PO BID for VCZ prophylaxis -- allopurinol 300mg  PO daily for TLS prophylaxis -- port in place -- no pain medication required at this time.   No orders of the defined types were placed in this encounter.  All questions were answered. The patient knows to call the clinic with any problems, questions or concerns.  A total of more than 30 minutes were spent on this encounter and over half of that time was spent on counseling and coordination of care as outlined above.   Ledell Peoples, MD Department of Hematology/Oncology [place] at [facility] Phone: [phone] Pager: [phone] Email: [email or web address]  12/10/2020 8:55 AM

## 2020-12-11 ENCOUNTER — Ambulatory Visit: Payer: Medicare Other

## 2020-12-11 DIAGNOSIS — A419 Sepsis, unspecified organism: Secondary | ICD-10-CM | POA: Diagnosis not present

## 2020-12-11 DIAGNOSIS — R5081 Fever presenting with conditions classified elsewhere: Secondary | ICD-10-CM | POA: Diagnosis not present

## 2020-12-11 DIAGNOSIS — R6520 Severe sepsis without septic shock: Secondary | ICD-10-CM | POA: Diagnosis not present

## 2020-12-11 DIAGNOSIS — D709 Neutropenia, unspecified: Secondary | ICD-10-CM | POA: Diagnosis not present

## 2020-12-11 LAB — CBC WITH DIFFERENTIAL/PLATELET
Abs Immature Granulocytes: 0.01 10*3/uL (ref 0.00–0.07)
Basophils Absolute: 0 10*3/uL (ref 0.0–0.1)
Basophils Relative: 2 %
Eosinophils Absolute: 0 10*3/uL (ref 0.0–0.5)
Eosinophils Relative: 3 %
HCT: 32.3 % — ABNORMAL LOW (ref 39.0–52.0)
Hemoglobin: 11.3 g/dL — ABNORMAL LOW (ref 13.0–17.0)
Immature Granulocytes: 1 %
Lymphocytes Relative: 22 %
Lymphs Abs: 0.2 10*3/uL — ABNORMAL LOW (ref 0.7–4.0)
MCH: 32.9 pg (ref 26.0–34.0)
MCHC: 35 g/dL (ref 30.0–36.0)
MCV: 94.2 fL (ref 80.0–100.0)
Monocytes Absolute: 0.7 10*3/uL (ref 0.1–1.0)
Monocytes Relative: 64 %
Neutro Abs: 0.1 10*3/uL — CL (ref 1.7–7.7)
Neutrophils Relative %: 8 %
Platelets: 184 10*3/uL (ref 150–400)
RBC: 3.43 MIL/uL — ABNORMAL LOW (ref 4.22–5.81)
RDW: 12.4 % (ref 11.5–15.5)
WBC: 1 10*3/uL — CL (ref 4.0–10.5)
nRBC: 0 % (ref 0.0–0.2)

## 2020-12-11 LAB — COMPREHENSIVE METABOLIC PANEL
ALT: 18 U/L (ref 0–44)
AST: 17 U/L (ref 15–41)
Albumin: 3.3 g/dL — ABNORMAL LOW (ref 3.5–5.0)
Alkaline Phosphatase: 75 U/L (ref 38–126)
Anion gap: 8 (ref 5–15)
BUN: 14 mg/dL (ref 8–23)
CO2: 25 mmol/L (ref 22–32)
Calcium: 9.4 mg/dL (ref 8.9–10.3)
Chloride: 106 mmol/L (ref 98–111)
Creatinine, Ser: 0.56 mg/dL — ABNORMAL LOW (ref 0.61–1.24)
GFR, Estimated: >60 mL/min (ref >60)
Glucose, Bld: 135 mg/dL — ABNORMAL HIGH (ref 70–99)
Potassium: 4.3 mmol/L (ref 3.5–5.1)
Sodium: 139 mmol/L (ref 135–145)
Total Bilirubin: 0.4 mg/dL (ref 0.3–1.2)
Total Protein: 6.8 g/dL (ref 6.5–8.1)

## 2020-12-11 LAB — MAGNESIUM: Magnesium: 2.1 mg/dL (ref 1.7–2.4)

## 2020-12-11 LAB — PROTIME-INR
INR: 1.1 (ref 0.8–1.2)
Prothrombin Time: 13.9 seconds (ref 11.4–15.2)

## 2020-12-11 LAB — SURGICAL PATHOLOGY

## 2020-12-11 LAB — CORTISOL-AM, BLOOD: Cortisol - AM: 1.2 ug/dL — ABNORMAL LOW (ref 6.7–22.6)

## 2020-12-11 LAB — PROCALCITONIN: Procalcitonin: <0.1 ng/mL

## 2020-12-11 MED ORDER — COSYNTROPIN 0.25 MG IJ SOLR: 0.2500 mg | Freq: Once | INTRAMUSCULAR | Status: DC

## 2020-12-11 MED ORDER — TBO-FILGRASTIM 300 MCG/0.5ML ~~LOC~~ SOSY
300.0000 ug | PREFILLED_SYRINGE | Freq: Every day | SUBCUTANEOUS | Status: AC
  Administered 2020-12-11 – 2020-12-15 (×5): 300 ug via SUBCUTANEOUS
  Filled 2020-12-11 (×5): qty 0.5

## 2020-12-11 NOTE — Progress Notes (Signed)
I looked at our patient's biopsy (showing progressive lymphoma above the radiation fields), recent CT (showing short interval growth of the new parotid nodules above the area where we treated the dominant mass), and inpatient notes.  I spoke with Dr. Lorenso Courier and I just spoke with the patient and his wife.  I recommend that we give him some time to respond to his antibiotics and recuperate.  I would like to then see him on Monday for a follow-up in person and then a CT SIM (treatment planning session )with contrast in radiation oncology so that we can get an up-to-date image of the new nodules and quickly turn around the plan to treat those new nodules as they seem to be growing quickly.  Our patient navigator will arrange this scheduling.  If anything changes over the weekend we can certainly cancel the CT sim but I want to keep his options open.  The patient and his wife are agreeable with this plan.  They are also aware that Dr. Lorenso Courier is exploring other options for chemotherapy with [facility].  -----------------------------------  Eppie Gibson, MD Radiation Oncology

## 2020-12-11 NOTE — Progress Notes (Signed)
Progress Note    Brian Castillo   WIO:[id number]  DOB: 1940/07/24  DOA: 12/10/2020     1 Date of Service: 12/11/2020  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 Lorenso Courier), HLD, HTN.  Started on palliative radiation therapy on 10/30/20. Prior to this treated with R-miniCHOP which was stopped on 10/11/20 due to progression of disease.   He presented to [facility] due to fevers at home and generalized fatigue.  He stated that he completed approximately 5 weeks of radiation about 1 week ago.  He states that his neck mass was attempted to be aspirated prior to initiation of radiation however it did not yield fluid. He underwent a CT of the neck on evaluation.  This showed a 7 cm transfacial right neck mass with marked size progression and extensive necrosis with evidence of draining to the skin.  The right facial artery traverses the necrotic mass.  2 groundglass nodules in the RUL also seen.  He was started on vancomycin and cefepime.  He was transferred for ongoing IV antibiotics and further work-up.   Subjective:  No events overnight.  Still having some ongoing pain and swelling in his neck, not unexpected.  Denies any fevers, chills.  Reviewed his labs with him today notably his ongoing severely low ANC.  Hospital Problems * Severe sepsis (HCC) - febrile, neutropenia. Source considered neck cellulitis - continue vanc, cefepime, flagyl given necrosis on CT - lactic normal - follow up cultures, NGTD - cortisol low, likely relative adrenal insufficiency; unable to perform ACTH stim test in setting of current steroid course   Neutropenic fever (HCC) - has remained neutropenic since early September - ANC remains severely low, approx ~90 today - continue neutropenic precautions - see sepsis  - discussed with Dr. 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 Lorenso Courier - s/p treatment with R-miniCHOP ending 10/11/20 and then started on palliative radiation on 10/30/20 - on allopurinol for TLS ppx; interaction with dilantin and levels being monitored outpatient (due to level check on 10/3) - continue acyclovir 400 mg BID for VCZ ppx - evaluated by rad onc on 9/26: started on decadron taper and gabapentin increased to 600 mg TID  Lung nodule - 2 new ground glass nodules in RUL noted on CT neck on 12/10/20  - see lymphoma   Seizure disorder ([place]) - Continue Dilantin - Due for Dilantin level check on 12/16/2020 - Given hospitalization, will go ahead and check level now as well  Normocytic anemia - Baseline hemoglobin approximately 11 g/dL - Currently at baseline  Bilateral occipital neuralgia - continue gabapentin   HTN (hypertension) - continue amlodipine and toprol    Objective Vital signs were reviewed Vitals:   12/10/20 1417 12/10/20 1847 12/10/20 2120 12/11/20 0517  BP: (!) 145/61 138/68 (!) 142/66 129/61  Pulse: 89 80 84 77  Resp: 17 18 18 18   Temp: 98.9 F (37.2 C) 100.1 F (37.8 C) 99.4 F (37.4 C) 98 F (36.7 C)  TempSrc: Oral Oral Oral Oral  SpO2: 94% 93% 95% 96%  Weight:      Height:       78.9 kg  Exam General appearance: alert, cooperative, and no distress Head: Normocephalic, without obvious abnormality, atraumatic Eyes:  EOMI Mouth: slight right mouth droop Neck:  large ~5 cm hardened mass with multiple areas of skin necrosis and surrounding erythema and TTP; unable to express any purulent drainage with compression and no open drainage into oral cavity Lungs: clear to auscultation bilaterally Heart: regular rate and rhythm and S1, S2 normal Abdomen: normal findings: bowel sounds normal and soft, non-tender Extremities:  no edema Skin: mobility and turgor normal Neurologic: decreased V2/V3 sensation in right face; right mouth droop  appreciated  Labs / Other Information My review of labs, imaging, notes and other tests is significant for ANC 90     Time spent: Greater than 50% of the 35 minute visit was spent in counseling/coordination of care for the patient as laid out in the A&P.   Dwyane Dee, MD  [facility] 12/11/2020, 12:52 PM

## 2020-12-12 ENCOUNTER — Ambulatory Visit: Payer: Medicare Other

## 2020-12-12 DIAGNOSIS — D709 Neutropenia, unspecified: Secondary | ICD-10-CM | POA: Diagnosis not present

## 2020-12-12 DIAGNOSIS — A419 Sepsis, unspecified organism: Secondary | ICD-10-CM | POA: Diagnosis not present

## 2020-12-12 DIAGNOSIS — R6520 Severe sepsis without septic shock: Secondary | ICD-10-CM | POA: Diagnosis not present

## 2020-12-12 LAB — CBC WITH DIFFERENTIAL/PLATELET
Abs Immature Granulocytes: 0.01 10*3/uL (ref 0.00–0.07)
Basophils Absolute: 0 10*3/uL (ref 0.0–0.1)
Basophils Relative: 2 %
Eosinophils Absolute: 0.2 10*3/uL (ref 0.0–0.5)
Eosinophils Relative: 12 %
HCT: 31.8 % — ABNORMAL LOW (ref 39.0–52.0)
Hemoglobin: 10.8 g/dL — ABNORMAL LOW (ref 13.0–17.0)
Immature Granulocytes: 1 %
Lymphocytes Relative: 22 %
Lymphs Abs: 0.3 10*3/uL — ABNORMAL LOW (ref 0.7–4.0)
MCH: 32.4 pg (ref 26.0–34.0)
MCHC: 34 g/dL (ref 30.0–36.0)
MCV: 95.5 fL (ref 80.0–100.0)
Monocytes Absolute: 0.8 10*3/uL (ref 0.1–1.0)
Monocytes Relative: 61 %
Neutro Abs: 0 10*3/uL — CL (ref 1.7–7.7)
Neutrophils Relative %: 2 %
Platelets: 176 10*3/uL (ref 150–400)
RBC: 3.33 MIL/uL — ABNORMAL LOW (ref 4.22–5.81)
RDW: 12.4 % (ref 11.5–15.5)
WBC: 1.3 10*3/uL — CL (ref 4.0–10.5)
nRBC: 0 % (ref 0.0–0.2)

## 2020-12-12 LAB — PHENYTOIN LEVEL, FREE AND TOTAL
Phenytoin, Free: 0.6 ug/mL — ABNORMAL LOW (ref 1.0–2.0)
Phenytoin, Total: 7.3 ug/mL — ABNORMAL LOW (ref 10.0–20.0)

## 2020-12-12 LAB — COMPREHENSIVE METABOLIC PANEL
ALT: 18 U/L (ref 0–44)
AST: 15 U/L (ref 15–41)
Albumin: 3.1 g/dL — ABNORMAL LOW (ref 3.5–5.0)
Alkaline Phosphatase: 73 U/L (ref 38–126)
Anion gap: 11 (ref 5–15)
BUN: 13 mg/dL (ref 8–23)
CO2: 25 mmol/L (ref 22–32)
Calcium: 8.5 mg/dL — ABNORMAL LOW (ref 8.9–10.3)
Chloride: 99 mmol/L (ref 98–111)
Creatinine, Ser: 0.75 mg/dL (ref 0.61–1.24)
GFR, Estimated: >60 mL/min (ref >60)
Glucose, Bld: 115 mg/dL — ABNORMAL HIGH (ref 70–99)
Potassium: 3.6 mmol/L (ref 3.5–5.1)
Sodium: 135 mmol/L (ref 135–145)
Total Bilirubin: 0.5 mg/dL (ref 0.3–1.2)
Total Protein: 6.5 g/dL (ref 6.5–8.1)

## 2020-12-12 LAB — MAGNESIUM: Magnesium: 2.1 mg/dL (ref 1.7–2.4)

## 2020-12-12 LAB — MRSA NEXT GEN BY PCR, NASAL: MRSA by PCR Next Gen: NOT DETECTED

## 2020-12-12 MED ORDER — OXYCODONE HCL 5 MG PO TABS
15.0000 mg | ORAL_TABLET | ORAL | Status: DC | PRN
Start: 2020-12-12 — End: 2020-12-14
  Administered 2020-12-12 – 2020-12-14 (×5): 15 mg via ORAL
  Filled 2020-12-12 (×5): qty 3

## 2020-12-12 MED ORDER — AMOXICILLIN-POT CLAVULANATE 875-125 MG PO TABS
1.0000 | ORAL_TABLET | Freq: Two times a day (BID) | ORAL | Status: DC
  Administered 2020-12-12 – 2020-12-17 (×11): 1 via ORAL
  Filled 2020-12-12 (×11): qty 1

## 2020-12-12 NOTE — Progress Notes (Signed)
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 Lorenso Courier), HLD, HTN.  Started on palliative radiation therapy on 10/30/20. Prior to this treated with R-miniCHOP which was stopped on 10/11/20 due to progression of disease.   He presented to [facility] due to fevers at home and generalized fatigue.  He stated that he completed approximately 5 weeks of radiation about 1 week ago.  He states that his neck mass was attempted to be aspirated prior to initiation of radiation however it did not yield fluid. He underwent a CT of the neck on evaluation.  This showed a 7 cm transfacial right neck mass with marked size progression and extensive necrosis with evidence of draining to the skin.  The right facial artery traverses the necrotic mass.  2 groundglass nodules in the RUL also seen.  He was started on vancomycin and cefepime.  He was transferred for ongoing IV antibiotics and further work-up.  Subjective:  Feels as if his mass is enlarging compared to yesterday.  Pain control is still being worked on as he is not fully controlled yet; regimen being further adjusted.  Hospital Problems * Severe sepsis (HCC)-resolved as of 12/12/2020 - febrile, neutropenia. Source considered neck cellulitis -No growth on cultures.  MRSA screen negative.  Has received at least 48 hours of IV antibiotics. -De-escalate to Augmentin and complete course - lactic normal - cortisol low, likely relative adrenal insufficiency; unable to perform ACTH stim test in setting of current steroid course   Severe neutropenia (HCC) - has remained neutropenic since early September - [place] down to 39 today - ANC remains severely low and continues to downtrend.  This is main issue likely keeping him hospitalized; noted he does have an appointment on Monday  with Dr. 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 Lorenso Courier - s/p treatment with R-miniCHOP ending 10/11/20 and then started on palliative radiation on 10/30/20 - on allopurinol for TLS ppx; interaction with dilantin and levels being monitored outpatient (due to level check on 10/3) - continue acyclovir 400 mg BID for VCZ ppx - evaluated by rad onc on 9/26: started on decadron taper and gabapentin increased to 600 mg TID  Lung nodule - 2 new ground glass nodules in RUL noted on CT neck on 12/10/20  - see lymphoma   Seizure disorder ([place]) - Continue Dilantin - Due for Dilantin level check on 12/16/2020 - Given hospitalization, will go ahead and check level now as well  Normocytic anemia - Baseline hemoglobin approximately 11 g/dL - Currently at baseline  Bilateral occipital neuralgia - continue gabapentin   HTN (hypertension) - continue amlodipine and toprol   Objective Vital signs were reviewed and unremarkable.  Vitals:   12/12/20 0509 12/12/20 0855 12/12/20 0900 12/12/20 1353  BP: 130/65 127/70  130/73  Pulse: 78 85  77  Resp: 19 14 19 15   Temp: 97.9 F (36.6 C) 98.2 F (36.8 C)  98.2 F (36.8 C)  TempSrc: Oral Oral  Oral  SpO2: 95%  95% 95% 95%  Weight:      Height:       78.9 kg  Exam General appearance: alert, cooperative, and no distress Head: Normocephalic, without obvious abnormality, atraumatic Eyes:  EOMI Mouth: slight right mouth droop Neck: large ~5 cm hardened mass with multiple areas of skin necrosis and surrounding erythema and TTP; unable to express any purulent drainage with compression and no open drainage into oral cavity Lungs: clear to auscultation bilaterally Heart: regular rate and  rhythm and S1, S2 normal Abdomen: normal findings: bowel sounds normal and soft, non-tender Extremities:  no edema Skin: mobility and turgor normal Neurologic: decreased V2/V3 sensation in right face; right mouth droop appreciated  Pic taken 9/29:    Labs / Other Information My review of labs, imaging, notes and other tests is significant for severely low ANC    Time spent: Greater than 50% of the 35 minute visit was spent in counseling/coordination of care for the patient as laid out in the A&P.  Dwyane Dee, MD [facility] 12/12/2020, 4:17 PM

## 2020-12-12 NOTE — Progress Notes (Signed)
CRITICAL VALUE STICKER  CRITICAL VALUE: neutrophils 0  RECEIVER (on-site recipient of call): Joran Kallal, [place] NOTIFIED: 12/12/20 1131  MESSENGER (representative from lab): Raelyn Ensign  MD NOTIFIED: Sabino Gasser, MD  TIME OF NOTIFICATION: 12/12/20 1133  RESPONSE: MD looking into labs

## 2020-12-13 ENCOUNTER — Other Ambulatory Visit: Payer: Self-pay | Admitting: Hematology and Oncology

## 2020-12-13 ENCOUNTER — Ambulatory Visit: Payer: Medicare Other

## 2020-12-13 DIAGNOSIS — D709 Neutropenia, unspecified: Secondary | ICD-10-CM | POA: Diagnosis not present

## 2020-12-13 DIAGNOSIS — C8251 Diffuse follicle center lymphoma, lymph nodes of head, face, and neck: Secondary | ICD-10-CM | POA: Diagnosis not present

## 2020-12-13 DIAGNOSIS — A419 Sepsis, unspecified organism: Principal | ICD-10-CM

## 2020-12-13 DIAGNOSIS — R6520 Severe sepsis without septic shock: Secondary | ICD-10-CM

## 2020-12-13 LAB — CBC WITH DIFFERENTIAL/PLATELET
Abs Immature Granulocytes: 0 10*3/uL (ref 0.00–0.07)
Basophils Absolute: 0 10*3/uL (ref 0.0–0.1)
Basophils Relative: 2 %
Eosinophils Absolute: 0.1 10*3/uL (ref 0.0–0.5)
Eosinophils Relative: 5 %
HCT: 31.3 % — ABNORMAL LOW (ref 39.0–52.0)
Hemoglobin: 10.8 g/dL — ABNORMAL LOW (ref 13.0–17.0)
Immature Granulocytes: 0 %
Lymphocytes Relative: 20 %
Lymphs Abs: 0.3 10*3/uL — ABNORMAL LOW (ref 0.7–4.0)
MCH: 32.9 pg (ref 26.0–34.0)
MCHC: 34.5 g/dL (ref 30.0–36.0)
MCV: 95.4 fL (ref 80.0–100.0)
Monocytes Absolute: 0.9 10*3/uL (ref 0.1–1.0)
Monocytes Relative: 66 %
Neutro Abs: 0.1 10*3/uL — CL (ref 1.7–7.7)
Neutrophils Relative %: 7 %
Platelets: 183 10*3/uL (ref 150–400)
RBC: 3.28 MIL/uL — ABNORMAL LOW (ref 4.22–5.81)
RDW: 12.5 % (ref 11.5–15.5)
WBC: 1.3 10*3/uL — CL (ref 4.0–10.5)
nRBC: 0 % (ref 0.0–0.2)

## 2020-12-13 LAB — COMPREHENSIVE METABOLIC PANEL
ALT: 22 U/L (ref 0–44)
AST: 18 U/L (ref 15–41)
Albumin: 3.1 g/dL — ABNORMAL LOW (ref 3.5–5.0)
Alkaline Phosphatase: 70 U/L (ref 38–126)
Anion gap: 9 (ref 5–15)
BUN: 16 mg/dL (ref 8–23)
CO2: 24 mmol/L (ref 22–32)
Calcium: 8.9 mg/dL (ref 8.9–10.3)
Chloride: 101 mmol/L (ref 98–111)
Creatinine, Ser: 0.71 mg/dL (ref 0.61–1.24)
GFR, Estimated: >60 mL/min (ref >60)
Glucose, Bld: 132 mg/dL — ABNORMAL HIGH (ref 70–99)
Potassium: 3.8 mmol/L (ref 3.5–5.1)
Sodium: 134 mmol/L — ABNORMAL LOW (ref 135–145)
Total Bilirubin: 0.3 mg/dL (ref 0.3–1.2)
Total Protein: 6.7 g/dL (ref 6.5–8.1)

## 2020-12-13 LAB — MAGNESIUM: Magnesium: 2 mg/dL (ref 1.7–2.4)

## 2020-12-13 MED ORDER — MORPHINE SULFATE (PF) 4 MG/ML IV SOLN
4.0000 mg | Freq: Once | INTRAVENOUS | Status: AC
  Filled 2020-12-13: qty 1

## 2020-12-13 MED ORDER — RESOURCE INSTANT PROTEIN PO PWD PACKET
1.0000 | Freq: Three times a day (TID) | ORAL | Status: DC
  Administered 2020-12-13 – 2020-12-19 (×11): 6 g via ORAL
  Filled 2020-12-13 (×19): qty 6

## 2020-12-13 MED ORDER — ENSURE ENLIVE PO LIQD
237.0000 mL | Freq: Three times a day (TID) | ORAL | Status: DC
  Administered 2020-12-13 – 2020-12-19 (×9): 237 mL via ORAL

## 2020-12-13 MED ORDER — ADULT MULTIVITAMIN W/MINERALS CH
1.0000 | ORAL_TABLET | Freq: Every day | ORAL | Status: DC
  Administered 2020-12-13 – 2020-12-19 (×7): 1 via ORAL
  Filled 2020-12-13 (×7): qty 1

## 2020-12-13 NOTE — Progress Notes (Signed)
Initial Nutrition Assessment  INTERVENTION:   -Ensure Plus PO TID, each provides 350 kcals and 13g protein   -Magic cup BID with meals, each supplement provides 290 kcal and 9 grams of protein  -Multivitamin with minerals daily  NUTRITION DIAGNOSIS:   Increased nutrient needs related to cancer and cancer related treatments as evidenced by estimated needs.  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 Lorenso Courier), HLD, HTN.   Started on palliative radiation therapy on 10/30/20. Prior to this treated with R-miniCHOP which was stopped on 10/11/20 due to progression of disease.     He presented to [facility] due to fevers at home and generalized fatigue.  Patient in room, seems visibly uncomfortable. States his appetite is fine but it difficult and painful to chew solid foods at the moment d/t his lymphoma of the neck. Pt cannot open his mouth that wide. Able to drink liquids well. Starting to drink Ensure today. Ordered for TID, pt open to trying Magic cups and Beneprotein as well for additional protein and kcals.   Per weight records, pt has lost 4 lbs since 9/8 (2% wt loss x 3 weeks, insignificant for time frame).   Medications reviewed.  Labs reviewed:  Low Na  NUTRITION - FOCUSED PHYSICAL EXAM:  No depletions noted.   Diet Order:   Diet Order             Diet regular Room service appropriate? Yes; Fluid consistency: Thin  Diet effective now                   EDUCATION NEEDS:   No education needs have been identified at this time  Skin:  Skin Assessment: Reviewed RN Assessment  Last BM:  9/29  Height:   Ht Readings from Last 1 Encounters:  12/10/20 5\' 8"  (1.727 m)     Weight:   Wt Readings from Last 1 Encounters:  12/10/20 78.9 kg    BMI:  Body mass index is 26.46 kg/m.  Estimated Nutritional Needs:   Kcal:  2200-2400  Protein:  110-125g  Fluid:  2.2L/day  Clayton Bibles, MS, RD, LDN Inpatient Clinical Dietitian Contact information available via Amion

## 2020-12-13 NOTE — Progress Notes (Signed)
Brief Oncology Progress Note  S: 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   Vitals:   12/13/20 0512 12/13/20 1232  BP: (!) 147/69 (!) 118/58  Pulse: 75 72  Resp: 19 18  Temp: 97.6 F (36.4 C) 98.4 F (36.9 C)  SpO2: 98% 98%   CBC Latest Ref Rng & Units 12/13/2020 12/12/2020 12/11/2020  WBC 4.0 - 10.5 K/uL 1.3(LL) 1.3(LL) 1.0(LL)  Hemoglobin 13.0 - 17.0 g/dL 10.8(L) 10.8(L) 11.3(L)  Hematocrit 39.0 - 52.0 % 31.3(L) 31.8(L) 32.3(L)  Platelets 150 - 400 K/uL 183 176 184   CMP Latest Ref Rng & Units 12/13/2020 12/12/2020 12/11/2020  Glucose 70 - 99 mg/dL 132(H) 115(H) 135(H)  BUN 8 - 23 mg/dL 16 13 14   Creatinine 0.61 - 1.24 mg/dL 0.71 0.75 0.56(L)  Sodium 135 - 145 mmol/L 134(L) 135 139  Potassium 3.5 - 5.1 mmol/L 3.8 3.6 4.3  Chloride 98 - 111 mmol/L 101 99 106  CO2 22 - 32 mmol/L 24 25 25   Calcium 8.9 - 10.3 mg/dL 8.9 8.5(L) 9.4  Total Protein 6.5 - 8.1 g/dL 6.7 6.5 6.8  Total Bilirubin 0.3 - 1.2 mg/dL 0.3 0.5 0.4  Alkaline Phos 38 - 126 U/L 70 73 75  AST 15 - 41 U/L 18 15 17   ALT 0 - 44 U/L 22 18 18    A/P:  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  #Neutropenia -- Etiology is unclear but may be related to inflammation versus infection.  Remote possibility this is secondary to bone marrow invasion of the lymphoma --Recommend pursuing a bone marrow biopsy while patient is in house --Continue filgrastim shots 300 mcg subcu daily until [place] is greater than 1.5 --Empiric antibiotics per primary team.  Recommend inpatient  admission and continuation of antibiotics as long as patient has an [place] less than 1.5  #DLBCL of Submandibular Lymph Node -- Disease has been refractory to multiple lines of chemotherapy as well as radiation --Currently working with radiation oncology for plan of action moving forward. --Next option for systemic therapy would be either gemcitabine oxaliplatin versus tafasitamab and lenalidomide --will schedule outpatient follow up at time of discharge.   Ledell Peoples, MD Department of Hematology/Oncology [place] at [facility] Phone: [phone] Pager: [phone] Email: [email or web address]

## 2020-12-13 NOTE — Care Management Important Message (Signed)
Medicare IM printed for Social Work at [facility] to give to the patient 

## 2020-12-13 NOTE — Progress Notes (Signed)
IR was requested for image guided BM BX for neutropenia in a pt with large B cell lymphoma.    BM BX not done on weekend, will plan for Monday 10/3.  Made NPO MN on Monday, CBC w/ diff to be drawn on Mon morning.  Will attempt to see pt and get consent over the weekend.    BM BX can be done as an outpt procedure.  If pt were to be discharged before Monday, please place out pt BM BX order.  The central schedulers will contact pt to schedule the BM BX.    Please call IR for questions and concerns.   Armando Gang Tnya Ades PA-C 12/13/2020 8:23 PM

## 2020-12-13 NOTE — Progress Notes (Signed)
Progress Note    Brian Castillo   AYT:[id number]  DOB: [birth year]  DOA: 12/10/2020     3 Date of Service: 12/13/2020   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 Lorenso Courier), HLD, HTN.  Started on palliative radiation therapy on 10/30/20. Prior to this treated with R-miniCHOP which was stopped on 10/11/20 due to progression of disease.   He presented to [facility] due to fevers at home and generalized fatigue.  He stated that he completed approximately 5 weeks of radiation about 1 week ago.  He states that his neck mass was attempted to be aspirated prior to initiation of radiation however it did not yield fluid. He underwent a CT of the neck on evaluation.  This showed a 7 cm transfacial right neck mass with marked size progression and extensive necrosis with evidence of draining to the skin.  The right facial artery traverses the necrotic mass.  2 groundglass nodules in the RUL also seen.  He was started on vancomycin and cefepime initially.  He was transferred for ongoing IV antibiotics and further work-up.  Subjective:  No events overnight.  Still having some difficulty controlling his pain, given an extra dose of morphine this morning.  However, does have overall better pain control compared to admission with his regimen being consistently adjusted as needed.  Hospital Problems * Severe sepsis (HCC)-resolved as of 12/12/2020 - febrile, neutropenia. Source considered neck cellulitis -No growth on cultures.  MRSA screen negative.  Has received at least 48 hours of IV antibiotics. -De-escalate to Augmentin and complete course - lactic normal - cortisol low, likely relative adrenal insufficiency; unable to perform ACTH stim test in setting of current steroid course   Severe neutropenia ([place]) - has remained neutropenic since early September - [place] improved some, up to 91 today (hopeful that nadir was 9/29) - continue  neutropenic precautions - see sepsis  - continue Granix, goal is to improve ANC > 1500  - discussed with Dr. Lorenso Courier. 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 Lorenso Courier and Dr. Isidore Moos - s/p treatment with R-miniCHOP ending 10/11/20 and then started on palliative radiation on 10/30/20 - on allopurinol for TLS ppx; interaction with dilantin and levels being monitored outpatient (due to level check on 10/3) - continue acyclovir 400 mg BID for VCZ ppx - evaluated by rad onc on 9/26: started on decadron taper and gabapentin increased to 600 mg TID - if patient remains in hospital on Monday, he will be brought down to appt with Dr. Isidore Moos followed by CT sim  Lung nodule - 2 new ground glass nodules in RUL noted on CT neck on 12/10/20  - see lymphoma   Seizure disorder ([place]) - Continue Dilantin - levels checked on 9/27: slightly low (free 0.6, total 7.3)  Normocytic anemia - Baseline hemoglobin approximately 11 g/dL - Currently at baseline  Bilateral occipital neuralgia - continue gabapentin   HTN (hypertension) - continue amlodipine and toprol   Objective Vital signs were reviewed and unremarkable.  Vitals:   12/12/20 1353 12/12/20 2049 12/13/20 0512 12/13/20 1232  BP: 130/73 (!) 141/62 (!) 147/69 (!) 118/58  Pulse: 77 82 75 72  Resp: 15 18 19 18   Temp: 98.2 F (36.8 C) 99 F (37.2 C) 97.6 F (36.4  C) 98.4 F (36.9 C)  TempSrc: Oral Oral Oral Oral  SpO2: 95% 100% 98% 98%  Weight:      Height:       78.9 kg  Exam General appearance: alert, cooperative, and no distress Head: Normocephalic, without obvious abnormality, atraumatic Eyes:  EOMI Mouth: slight right mouth droop Neck: large ~5 cm hardened mass with multiple areas of skin necrosis and surrounding erythema and TTP; unable to express any purulent drainage  with compression and no open drainage into oral cavity Lungs: clear to auscultation bilaterally Heart: regular rate and rhythm and S1, S2 normal Abdomen: normal findings: bowel sounds normal and soft, non-tender Extremities:  no edema Skin: mobility and turgor normal Neurologic: decreased V2/V3 sensation in right face; right mouth droop appreciated  Pic taken 9/29:    Labs / Other Information My review of labs, imaging, notes and other tests is significant for severely low ANC    Time spent: Greater than 50% of the 35 minute visit was spent in counseling/coordination of care for the patient as laid out in the A&P.  Dwyane Dee, MD [facility] 12/13/2020, 3:38 PM

## 2020-12-14 DIAGNOSIS — C8251 Diffuse follicle center lymphoma, lymph nodes of head, face, and neck: Secondary | ICD-10-CM | POA: Diagnosis not present

## 2020-12-14 DIAGNOSIS — R6520 Severe sepsis without septic shock: Secondary | ICD-10-CM | POA: Diagnosis not present

## 2020-12-14 DIAGNOSIS — A419 Sepsis, unspecified organism: Secondary | ICD-10-CM | POA: Diagnosis not present

## 2020-12-14 LAB — CBC WITH DIFFERENTIAL/PLATELET
Abs Immature Granulocytes: 0.05 10*3/uL (ref 0.00–0.07)
Basophils Absolute: 0 10*3/uL (ref 0.0–0.1)
Basophils Relative: 1 %
Eosinophils Absolute: 0.1 10*3/uL (ref 0.0–0.5)
Eosinophils Relative: 5 %
HCT: 32.4 % — ABNORMAL LOW (ref 39.0–52.0)
Hemoglobin: 10.9 g/dL — ABNORMAL LOW (ref 13.0–17.0)
Immature Granulocytes: 2 %
Lymphocytes Relative: 16 %
Lymphs Abs: 0.4 10*3/uL — ABNORMAL LOW (ref 0.7–4.0)
MCH: 32.2 pg (ref 26.0–34.0)
MCHC: 33.6 g/dL (ref 30.0–36.0)
MCV: 95.6 fL (ref 80.0–100.0)
Monocytes Absolute: 1.1 10*3/uL — ABNORMAL HIGH (ref 0.1–1.0)
Monocytes Relative: 42 %
Neutro Abs: 0.8 10*3/uL — ABNORMAL LOW (ref 1.7–7.7)
Neutrophils Relative %: 34 %
Platelets: 181 10*3/uL (ref 150–400)
RBC: 3.39 MIL/uL — ABNORMAL LOW (ref 4.22–5.81)
RDW: 12.4 % (ref 11.5–15.5)
WBC: 2.5 10*3/uL — ABNORMAL LOW (ref 4.0–10.5)
nRBC: 0.8 % — ABNORMAL HIGH (ref 0.0–0.2)

## 2020-12-14 LAB — COMPREHENSIVE METABOLIC PANEL
ALT: 25 U/L (ref 0–44)
AST: 23 U/L (ref 15–41)
Albumin: 3.1 g/dL — ABNORMAL LOW (ref 3.5–5.0)
Alkaline Phosphatase: 71 U/L (ref 38–126)
Anion gap: 7 (ref 5–15)
BUN: 14 mg/dL (ref 8–23)
CO2: 26 mmol/L (ref 22–32)
Calcium: 8.9 mg/dL (ref 8.9–10.3)
Chloride: 105 mmol/L (ref 98–111)
Creatinine, Ser: 0.55 mg/dL — ABNORMAL LOW (ref 0.61–1.24)
GFR, Estimated: >60 mL/min (ref >60)
Glucose, Bld: 129 mg/dL — ABNORMAL HIGH (ref 70–99)
Potassium: 4.4 mmol/L (ref 3.5–5.1)
Sodium: 138 mmol/L (ref 135–145)
Total Bilirubin: 0.3 mg/dL (ref 0.3–1.2)
Total Protein: 6.5 g/dL (ref 6.5–8.1)

## 2020-12-14 LAB — MAGNESIUM: Magnesium: 2.3 mg/dL (ref 1.7–2.4)

## 2020-12-14 MED ORDER — ROSUVASTATIN CALCIUM 20 MG PO TABS
20.0000 mg | ORAL_TABLET | Freq: Every day | ORAL | Status: DC
  Administered 2020-12-14 – 2020-12-19 (×6): 20 mg via ORAL
  Filled 2020-12-14 (×6): qty 1

## 2020-12-14 MED ORDER — HYDROMORPHONE HCL 2 MG PO TABS
2.0000 mg | ORAL_TABLET | ORAL | Status: DC | PRN
Start: 2020-12-14 — End: 2020-12-15
  Administered 2020-12-14: 2 mg via ORAL
  Filled 2020-12-14: qty 1

## 2020-12-14 MED ORDER — CETAPHIL MOISTURIZING EX LOTN
TOPICAL_LOTION | CUTANEOUS | Status: DC | PRN
  Filled 2020-12-14: qty 473

## 2020-12-14 NOTE — Progress Notes (Signed)
Progress Note    Brian Castillo   [id number]:[id number]  DOB: 01-27-1941  DOA: 12/10/2020     4 Date of Service: 12/14/2020   Mr. 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 Lorenso Courier), HLD, HTN.  Started on palliative radiation therapy on 10/30/20. Prior to this treated with R-miniCHOP which was stopped on 10/11/20 due to progression of disease.   He presented to [facility] due to fevers at home and generalized fatigue.  He stated that he completed approximately 5 weeks of radiation about 1 week ago.  He states that his neck mass was attempted to be aspirated prior to initiation of radiation however it did not yield fluid. He underwent a CT of the neck on evaluation.  This showed a 7 cm transfacial right neck mass with marked size progression and extensive necrosis with evidence of draining to the skin.  The right facial artery traverses the necrotic mass.  2 groundglass nodules in the RUL also seen.  He was started on vancomycin and cefepime initially.  He was transferred for ongoing IV antibiotics and further work-up.  Subjective:  States that oral Dilaudid was working better for his pain at home, therefore we are transitioning to this today. He understands tentative plan for bone marrow biopsy on Monday as well as CT simulation.  Hospital Problems * Severe sepsis (HCC)-resolved as of 12/12/2020 - febrile, neutropenia. Source considered neck cellulitis -No growth on cultures.  MRSA screen negative.  Has received at least 48 hours of IV antibiotics. -De-escalate to Augmentin and complete course - lactic normal - cortisol low, likely relative adrenal insufficiency; unable to perform ACTH stim test in setting of current steroid course   Severe neutropenia ([place]) - has remained neutropenic since early September - [place] improved some, up to 91 today (hopeful that nadir was 9/29) - continue neutropenic precautions - see sepsis  - continue  Granix, goal is to improve ANC > 1500  - discussed with Dr. Lorenso Courier. 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 Lorenso Courier and Dr. Isidore Moos - s/p treatment with R-miniCHOP ending 10/11/20 and then started on palliative radiation on 10/30/20 - on allopurinol for TLS ppx; interaction with dilantin and levels being monitored outpatient (due to level check on 10/3) - continue acyclovir 400 mg BID for VCZ ppx - evaluated by rad onc on 9/26: started on decadron taper and gabapentin increased to 600 mg TID - if patient remains in hospital on Monday, he will be brought down to appt with Dr. Isidore Moos followed by CT sim  Lung nodule - 2 new ground glass nodules in RUL noted on CT neck on 12/10/20  - see lymphoma   Seizure disorder ([place]) - Continue Dilantin - levels checked on 9/27: slightly low (free 0.6, total 7.3)  Normocytic anemia - Baseline hemoglobin approximately 11 g/dL - Currently at baseline  Bilateral occipital neuralgia - continue gabapentin   HTN (hypertension) - continue amlodipine and toprol   Objective Vital signs were reviewed and unremarkable.  Vitals:   12/13/20 1232 12/13/20 2131 12/14/20 0528 12/14/20 1317  BP: (!) 118/58 (!) 125/57 128/63 (!) 133/58  Pulse: 72 78 72 75  Resp: 18 14 14  20  Temp: 98.4 F (36.9 C) 98.7 F (37.1 C) 98 F (36.7 C) 97.9 F (36.6 C)  TempSrc: Oral Oral Oral Oral  SpO2: 98% 96% 98% 97%  Weight:      Height:       78.9 kg  Exam General appearance: alert, cooperative, and no distress Head: Normocephalic, without obvious abnormality, atraumatic Eyes:  EOMI Mouth: slight right mouth droop Neck: large ~6 cm hardened mass with multiple areas of skin necrosis and surrounding erythema (much improved)  and TTP (improved); unable to express any purulent drainage with compression and no open drainage into oral cavity Lungs: clear to auscultation bilaterally Heart: regular rate and rhythm and S1, S2 normal Abdomen: normal findings: bowel sounds normal and soft, non-tender Extremities:  no edema Skin: mobility and turgor normal Neurologic: decreased V2/V3 sensation in right face  Pic taken 9/29:    Labs / Other Information My review of labs, imaging, notes and other tests is significant for severely low ANC    Time spent: Greater than 50% of the 35 minute visit was spent in counseling/coordination of care for the patient as laid out in the A&P.  Dwyane Dee, MD [facility] 12/14/2020, 1:31 PM

## 2020-12-14 NOTE — H&P (Signed)
Chief Complaint: Patient was seen in consultation today for image guided bone marrow biopsy and aspiration Chief Complaint  Patient presents with   Fever   at the request of Girguis, D.   Referring Physician(s): Girguis, D.  Supervising Physician: Mir, Sharen Heck  Patient Status: [facility] - In-pt  History of Present Illness: Brian Castillo is a 80 y.o. 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  Patient is known to IR service for ultrasound-guided biopsy of right parotid lesion on 12/06/2020 with Dr. Anselm Pancoast.  Pathology revealed non-Hodgkin's B-cell lymphoma.   Vitals in the ED showed no fever, however, labs revealed severe neutropenia, neutrophil count less than 0.1 K. Patient underwent CT soft tissue neck with contrast due to ulceration on right lateral neck which showed 7 cm right neck mass with marked size progression from June 2022 and extensive necrosis with evidence of draining to the skin.  Patient was started on IV antibiotics and admitted for further work-up.  Radiation oncology was consulted and patient was evaluated by Dr. Lorenso Courier on 12/13/2020, who recommended a bone marrow biopsy to evaluate etiology of neutropenia.  After thorough discussion and shared decision making, patient decided to proceed with the bone marrow biopsy.  IR was requested for image guided bone marrow biopsy and aspiration.  Patient laying in bed, not in acute distress. Wife at bedside. Reports HA, relates to right facial swelling/mass. HA not worse than usual.  Denise fever, chills, shortness of breath, cough, chest pain, abdominal pain, nausea ,vomiting, and bleeding.   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   Seizures ([place])    last seizure was in 1988    Past Surgical History:  Procedure Laterality Date   CARPOMETACARPEL SUSPENSION PLASTY Right 08/24/2014   Procedure: RIGHT CMC ARTHOPLASTY WITH DOUBLE TENDON TRANSFER AND REPAIR  RECONSTRUCTION ;  Surgeon: Roseanne Kaufman, MD;  Location: [place];  Service: Orthopedics;  Laterality: Right;   CHOLECYSTECTOMY     COLONOSCOPY     IR IMAGING GUIDED PORT INSERTION  08/14/2020   IR RADIOLOGIST EVAL & MGMT  08/20/2020   KNEE ARTHROSCOPY  2003   bilateral   POLYPECTOMY      Allergies: Brimonidine tartrate-timolol, Lisinopril, and Netarsudil dimesylate  Medications: Prior to Admission medications   Medication Sig Start Date End Date Taking? Authorizing Provider  amLODipine (NORVASC) 5 MG tablet Take 5 mg by mouth Daily.  11/28/11  Yes [provider]  aspirin 81 MG tablet Take 81 mg by mouth daily.   Yes [provider]  Calcium Carb-Cholecalciferol (CALCIUM 600 + D PO) Take 1 tablet by mouth daily.   Yes [provider]  cholecalciferol (VITAMIN D) 1000 units tablet Take 1,000 Units by mouth daily.   Yes [provider]  dexamethasone (DECADRON) 4 MG tablet Take 2 tablets BID through 9/30, then 1 tablet daily through 10/7, then 1/2 tablet daily through 10/14. Take with food. 12/09/20  Yes Eppie Gibson, MD  gabapentin (NEURONTIN) 300 MG capsule Take 2 capsules (600 mg total) by mouth 3 (three) times daily. Take 1 capsule (300 mg total) by mouth twice a day and 2 capsules (66m) at bedtime Patient taking differently: Take 600 mg by mouth 3 (three) times daily. 11/05/20  Yes Vaslow, ZAcey Lav MD  HYDROmorphone (DILAUDID) 2 MG tablet Take 1 tablet (2 mg total) by mouth every 4 (four) hours as needed for severe pain. Patient taking differently: Take 2 mg by mouth at bedtime as  needed for severe pain. 12/05/20  Yes DOrson Slick MD  Latanoprostene Bunod (VYZULTA) 0.024 % SOLN Place 1 drop into both eyes at bedtime.   Yes [provider]  lidocaine-prilocaine (EMLA) cream Apply 1 application topically as needed. Patient taking differently: Apply 1 application topically daily as needed (port access). 08/28/20  Yes DOrson Slick MD  metoprolol succinate (TOPROL-XL) 25 MG 24 hr tablet Take 25 mg by mouth Daily.  11/28/11  Yes [provider]  phenytoin (DILANTIN) 100 MG ER capsule Take 200 mg by mouth 2 (two) times daily.   Yes [provider]  rosuvastatin (CRESTOR) 20 MG tablet Take 20 mg by mouth daily.   Yes [provider]  timolol (BETIMOL) 0.5 % ophthalmic solution Place 1 drop into both eyes 2 (two) times daily.   Yes [provider]  acyclovir (ZOVIRAX) 400 MG tablet TAKE 1 TABLET BY MOUTH TWICE A DAY 12/13/20   DOrson Slick MD  allopurinol (ZYLOPRIM) 300 MG tablet TAKE 1 TABLET BY MOUTH EVERY DAY 12/13/20   DOrson Slick MD  cyclobenzaprine (FLEXERIL) 5 MG tablet Take 1 tablet (5 mg total) by mouth 3 (three) times daily as needed for muscle spasms. Patient not taking: No sig reported 09/09/20   DOrson Slick MD  lidocaine (XYLOCAINE) 2 % solution Use as directed 15 mLs in the mouth or throat every 3 (three) hours as needed for mouth pain. Swish, gargle and spit out every 3 hours as needed for mouth/throat  pain Patient not taking: No sig reported 12/05/20   DOrson Slick MD  oxyCODONE (OXY IR/ROXICODONE) 5 MG immediate release tablet Take 2-3 tablets (10-15 mg total) by mouth every 4 (four) hours as needed for severe pain. Patient not taking: No sig reported 10/24/20   DOrson Slick MD  senna-docusate (SENOKOT-S) 8.6-50 MG tablet Take 2 tablets by mouth at bedtime as needed for moderate constipation. Patient not taking: No sig reported 09/16/20   ADamita Lack MD     Family History   Problem Relation Age of Onset   Colon cancer Neg Hx    Colon polyps Neg Hx     Social History   Socioeconomic History   Marital status: Married    Spouse name: Not on file   Number of children: Not on file   Years of education: Not on file   Highest education level: Not on file  Occupational History   Not on file  Tobacco Use   Smoking status: Former   Smokeless tobacco: Never   Tobacco comments:    quit 1984  Vaping Use   Vaping Use: Never used  Substance and Sexual Activity   Alcohol use: Yes    Alcohol/week: 6.0 standard drinks    Types: 6 Glasses of wine per week   Drug use: No   Sexual activity: Not on file  Other Topics Concern   Not on file  Social History Narrative   Not on file   Social Determinants of Health   Financial Resource Strain: Not on file  Food Insecurity: Not on file  Transportation Needs: Not on file  Physical Activity: Not on file  Stress: Not on file  Social Connections: Not on file     Review of Systems: A 12 point ROS discussed and pertinent positives are indicated in the HPI above.  All other systems are negative.  Vital Signs: BP (!) 133/58 (BP Location: Right Arm)   Pulse 75   Temp 97.9 F (36.6 C) (Oral)   Resp 20   Ht 5' 8"  (1.727 m)   Wt 174 lb (78.9 kg)   SpO2 97%   BMI 26.46 kg/m    Physical Exam Vitals reviewed.  Constitutional:      General: He is not in acute distress.    Appearance: He is not ill-appearing.  HENT:     Head: Normocephalic and atraumatic.     Comments: Swelling/mass with erythema on right jaw line, extends to right neck     Mouth/Throat:     Mouth: Mucous membranes are moist.  Neck:     Comments: Mass on right neck with erythema, no open wound or  drainage noted  Pulmonary:     Effort: Pulmonary effort is normal.     Breath sounds: Wheezing present.     Comments: LLL expiratory wheeze Abdominal:     General: Abdomen is flat. Bowel sounds are normal.     Palpations: Abdomen is soft.   Skin:    General: Skin is warm and dry.     Coloration: Skin is not jaundiced.  Neurological:     Mental Status: He is alert and oriented to person, place, and time.  Psychiatric:        Mood and Affect: Mood normal.        Behavior: Behavior normal.        Judgment: Judgment normal.    MD Evaluation Airway: WNL Heart: WNL Abdomen: WNL Chest/ Lungs: WNL ASA  Classification: 3 Mallampati/Airway Score: Two  Imaging: DG Chest 2 View  Result Date: 12/10/2020 CLINICAL DATA:  Cough and fever EXAM: CHEST - 2 VIEW COMPARISON:  06/25/2020 FINDINGS: Coarsened lung markings over the lower lobes on the lateral view with scar-like appearance by prior chest CT February 2022. There is no edema, consolidation, effusion, or pneumothorax. Normal heart size and mediastinal contours. Unremarkable porta catheter positioning IMPRESSION: No evidence of active disease. Electronically Signed   By: Jorje Guild M.D.   On: 12/10/2020 05:18   DG Wrist Complete Left  Result Date: 12/05/2020 CLINICAL DATA:  Fall. EXAM: LEFT HAND - COMPLETE 3+ VIEW; LEFT WRIST - COMPLETE 3+ VIEW COMPARISON:  None. FINDINGS: No acute fracture or dislocation. Prior trapeziiectomy. Mild osteoarthritis of the DIP joints and thumb IP joint. Bone mineralization is normal. Soft tissues are unremarkable. IMPRESSION: 1. No acute osseous abnormality. Electronically Signed   By: Titus Dubin M.D.   On: 12/05/2020 18:19   CT Head Wo Contrast  Result Date: 12/05/2020 CLINICAL DATA:  Recent fall with left facial trauma and pain, initial encounter EXAM: CT HEAD WITHOUT CONTRAST CT MAXILLOFACIAL WITHOUT CONTRAST CT CERVICAL SPINE WITHOUT CONTRAST TECHNIQUE: Multidetector CT imaging of the head, cervical spine, and maxillofacial structures were performed using the standard protocol without intravenous contrast. Multiplanar CT image reconstructions of the cervical spine and maxillofacial structures were also generated. COMPARISON:  09/11/2020  FINDINGS: CT HEAD FINDINGS Brain: No evidence  of acute infarction, hemorrhage, hydrocephalus, extra-axial collection or mass lesion/mass effect. Mild atrophic changes are noted. Vascular: No hyperdense vessel or unexpected calcification. Skull: Normal. Negative for fracture or focal lesion. Other: None. CT MAXILLOFACIAL FINDINGS Osseous: Mildly downward displaced distal nasal bone fracture is noted best seen on image number 48 of series 9. This is noted centrally at the bridge of the nose. No other fractures are seen. No other bony abnormality is noted. Orbits: The orbits and their contents are within normal limits. No muscular abnormality is noted. Sinuses: Paranasal sinuses are well aerated. No mucosal abnormality is seen. Soft tissues: Surrounding soft tissue structures again show an enlarged soft tissue mass lesion similar to that seen on prior CT of the neck. It demonstrates changes of central necrosis as well as some air within which may be related to recent biopsy. Clinical correlation is recommended. This lesion now measures 6.3 x 4.8 cm which is increased from the prior exam at which time it measured 4.9 x 3.3 cm. Some surrounding soft tissue edema is noted. Some extension along the masseter muscle on the right is noted she was not appreciated on the prior exam. To soft tissue foci are seen. One of these measures approximately 15 mm and the second approximally 18 mm. Mild soft tissue swelling in the forehead is noted. CT CERVICAL SPINE FINDINGS Alignment: Within normal limits. Skull base and vertebrae: Cervical segments are well visualized. Vertebral body height is well maintained. Disc space narrowing is noted from C4-C7 with mild associated osteophytic changes. Multilevel facet hypertrophic changes are seen. No acute fracture or acute facet abnormality is noted. Neural foraminal narrowing is noted bilaterally. Soft tissues and spinal canal: Surrounding soft tissue structures again show a mass lesion in  the right lateral neck increased in size from the prior study in June of 2022 with some central necrosis and air identified. Upper chest: Visualized lung apices are within normal limits. Other: None IMPRESSION: CT of the head: Mild atrophic changes without acute intracranial abnormality. CT of the maxillofacial bones: Acute nasal bone fracture involving the bridge distally. No other bony abnormality is seen. Soft tissue mass lesion in the right lateral neck similar to this seen on prior CT examination from June of 2022 although increased in size as described. Some additional soft tissue components extending along the masseter muscle on the right are noted new from the prior exam. This is consistent with the given clinical history of lymphoma. Mild left forehead hematoma is seen. CT of the cervical spine: Multilevel degenerative changes without acute abnormality. Electronically Signed   By: Inez Catalina M.D.   On: 12/05/2020 17:23   CT Soft Tissue Neck W Contrast  Result Date: 12/10/2020 CLINICAL DATA:  Lymphoma under treatment. Malaise and fever. Ulcerated right lateral neck, site of radiotherapy EXAM: CT NECK WITH CONTRAST TECHNIQUE: Multidetector CT imaging of the neck was performed using the standard protocol following the bolus administration of intravenous contrast. CONTRAST:  34m OMNIPAQUE IOHEXOL 350 MG/ML SOLN COMPARISON:  09/11/2020 FINDINGS: Pharynx and larynx: No evidence of mass or thickening of Waldeyer's ring. Palatine tonsilliths Salivary glands: Right submandibular mass involving the parotid tail and submandibular glands, progressed in size, and necrotic with central low-density, gas, and opening to the skin surface. The mass measures up to 7 cm in span. Increased solid tumor invasion into the right parotid and right anterior masseter. The mass is closely associated with the right facial artery, without visible pseudoaneurysm. Thyroid: Normal Lymph nodes: It is unclear if the  right neck mass was  initially a node. No other enlarged or heterogeneous nodes. Vascular: As above. Atheromatous calcifications and right IJ porta catheter. 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 Visualized orbits: Negative Mastoids and visualized paranasal sinuses: Clear Skeleton: Patient's mass is in continuity with the right mandibular body without asymmetric bone loss or erosion. Upper chest: 2 new ground-glass nodules in the right upper lobe measuring 1 cm. IMPRESSION: 1. ~7 cm trans spatial right neck mass with marked size progression from June 2020 and extensive necrosis with evidence of draining to the skin. The right facial artery traverses the necrotic mass. 2. Two new ground-glass nodules in the right upper lobe which could be lymphomatous. Electronically Signed   By: Jorje Guild M.D.   On: 12/10/2020 07:39   CT Cervical Spine Wo Contrast  Result Date: 12/05/2020 CLINICAL DATA:  Recent fall with left facial trauma and pain, initial encounter EXAM: CT HEAD WITHOUT CONTRAST CT MAXILLOFACIAL WITHOUT CONTRAST CT CERVICAL SPINE WITHOUT CONTRAST TECHNIQUE: Multidetector CT imaging of the head, cervical spine, and maxillofacial structures were performed using the standard protocol without intravenous contrast. Multiplanar CT image reconstructions of the cervical spine and maxillofacial structures were also generated. COMPARISON:  09/11/2020 FINDINGS: CT HEAD FINDINGS Brain: No evidence of acute infarction, hemorrhage, hydrocephalus, extra-axial collection or mass lesion/mass effect. Mild atrophic changes are noted. Vascular: No hyperdense vessel or unexpected calcification. Skull: Normal. Negative for fracture or focal lesion. Other: None. CT MAXILLOFACIAL FINDINGS Osseous: Mildly downward displaced distal nasal bone fracture is noted best seen on image number 48 of series 9. This is noted centrally at the bridge of the nose. No other fractures are seen. No other bony abnormality is  noted. Orbits: The orbits and their contents are within normal limits. No muscular abnormality is noted. Sinuses: Paranasal sinuses are well aerated. No mucosal abnormality is seen. Soft tissues: Surrounding soft tissue structures again show an enlarged soft tissue mass lesion similar to that seen on prior CT of the neck. It demonstrates changes of central necrosis as well as some air within which may be related to recent biopsy. Clinical correlation is recommended. This lesion now measures 6.3 x 4.8 cm which is increased from the prior exam at which time it measured 4.9 x 3.3 cm. Some surrounding soft tissue edema is noted. Some extension along the masseter muscle on the right is noted she was not appreciated on the prior exam. To soft tissue foci are seen. One of these measures approximately 15 mm and the second approximally 18 mm. Mild soft tissue swelling in the forehead is noted. CT CERVICAL SPINE FINDINGS Alignment: Within normal limits. Skull base and vertebrae: Cervical segments are well visualized. Vertebral body height is well maintained. Disc space narrowing is noted from C4-C7 with mild associated osteophytic changes. Multilevel facet hypertrophic changes are seen. No acute fracture or acute facet abnormality is noted. Neural foraminal narrowing is noted bilaterally. Soft tissues and spinal canal: Surrounding soft tissue structures again show a mass lesion in the right lateral neck increased in size from the prior study in June of 2022 with some central necrosis and air identified. Upper chest: Visualized lung apices are within normal limits. Other: None IMPRESSION: CT of the head: Mild atrophic changes without acute intracranial abnormality. CT of the maxillofacial bones: Acute nasal bone fracture involving the bridge distally. No other bony abnormality is seen. Soft tissue mass lesion in the right lateral neck similar to this seen on prior CT examination  from June of 2022 although increased in size as  described. Some additional soft tissue components extending along the masseter muscle on the right are noted new from the prior exam. This is consistent with the given clinical history of lymphoma. Mild left forehead hematoma is seen. CT of the cervical spine: Multilevel degenerative changes without acute abnormality. Electronically Signed   By: Inez Catalina M.D.   On: 12/05/2020 17:23   DG Knee Complete 4 Views Left  Result Date: 12/05/2020 CLINICAL DATA:  Fall. EXAM: LEFT KNEE - COMPLETE 4+ VIEW COMPARISON:  None. FINDINGS: No acute fracture or dislocation. No joint effusion. Mild medial compartment joint space narrowing. Bone mineralization is normal. Soft tissues are unremarkable. IMPRESSION: 1. No acute osseous abnormality. 2. Mild medial compartment osteoarthritis. Electronically Signed   By: Titus Dubin M.D.   On: 12/05/2020 18:16   DG Hand Complete Left  Result Date: 12/05/2020 CLINICAL DATA:  Fall. EXAM: LEFT HAND - COMPLETE 3+ VIEW; LEFT WRIST - COMPLETE 3+ VIEW COMPARISON:  None. FINDINGS: No acute fracture or dislocation. Prior trapeziiectomy. Mild osteoarthritis of the DIP joints and thumb IP joint. Bone mineralization is normal. Soft tissues are unremarkable. IMPRESSION: 1. No acute osseous abnormality. Electronically Signed   By: Titus Dubin M.D.   On: 12/05/2020 18:19   [place] CORE BIOPSY (SALIVARY GLAND/PAROTID GLAND)  Result Date: 12/06/2020 INDICATION: 80 year old with a large right neck mass and follicular lymphoma. Patient has new lesions on the right side of the face. EXAM: Ultrasound-guided core biopsy of right parotid gland lesion MEDICATIONS: Moderate sedation ANESTHESIA/SEDATION: Moderate (conscious) sedation was employed during this procedure. A total of Versed 1.0 mg and Fentanyl 50 mcg was administered intravenously. Moderate Sedation Time: 15 minutes. The patient's level of consciousness and vital signs were monitored continuously by radiology nursing throughout the  procedure under my direct supervision. FLUOROSCOPY TIME:  None COMPLICATIONS: None immediate. PROCEDURE: Informed written consent was obtained from the patient after a thorough discussion of the procedural risks, benefits and alternatives. All questions were addressed. A timeout was performed prior to the initiation of the procedure. Patient has visible and palpable lesions on the right side of the face above the right neck mass. This area was evaluated with ultrasound. Two nodules in this area correspond with the recent CT imaging. Right side of the face was prepped with chlorhexidine and sterile field was created. Skin and soft tissues were anesthetized using 1% lidocaine. A tiny incision was made on the right side of the neck. Using ultrasound guidance, a total of 5 core biopsies were obtained with an 18 gauge [place] device in the right parotid lesion. Specimens placed in saline. Bandage placed over the puncture site. FINDINGS: Large mass in the right upper neck. There are 2 visible nodules on the right side of the face anterior to the right ear. These correspond with nodules on the recent CT imaging. There is 1 nodule within the anterior right parotid gland that measures 1.5 x 0.9 x 1.5 cm. This nodule was biopsied. There is a larger nodule anterior to the right parotid nodule that is more hypoechoic and heterogeneous. This nodule is involving the right masseter muscle based on the CT and is contiguous with the larger right neck mass based on sonography. Morphology of these new right facial nodules are similar to the larger right neck mass. IMPRESSION: Ultrasound-guided core biopsies of the right parotid lesion. Electronically Signed   By: Markus Daft M.D.   On: 12/06/2020 15:35   CT  Maxillofacial WO CM  Result Date: 12/05/2020 CLINICAL DATA:  Recent fall with left facial trauma and pain, initial encounter EXAM: CT HEAD WITHOUT CONTRAST CT MAXILLOFACIAL WITHOUT CONTRAST CT CERVICAL SPINE WITHOUT  CONTRAST TECHNIQUE: Multidetector CT imaging of the head, cervical spine, and maxillofacial structures were performed using the standard protocol without intravenous contrast. Multiplanar CT image reconstructions of the cervical spine and maxillofacial structures were also generated. COMPARISON:  09/11/2020 FINDINGS: CT HEAD FINDINGS Brain: No evidence of acute infarction, hemorrhage, hydrocephalus, extra-axial collection or mass lesion/mass effect. Mild atrophic changes are noted. Vascular: No hyperdense vessel or unexpected calcification. Skull: Normal. Negative for fracture or focal lesion. Other: None. CT MAXILLOFACIAL FINDINGS Osseous: Mildly downward displaced distal nasal bone fracture is noted best seen on image number 48 of series 9. This is noted centrally at the bridge of the nose. No other fractures are seen. No other bony abnormality is noted. Orbits: The orbits and their contents are within normal limits. No muscular abnormality is noted. Sinuses: Paranasal sinuses are well aerated. No mucosal abnormality is seen. Soft tissues: Surrounding soft tissue structures again show an enlarged soft tissue mass lesion similar to that seen on prior CT of the neck. It demonstrates changes of central necrosis as well as some air within which may be related to recent biopsy. Clinical correlation is recommended. This lesion now measures 6.3 x 4.8 cm which is increased from the prior exam at which time it measured 4.9 x 3.3 cm. Some surrounding soft tissue edema is noted. Some extension along the masseter muscle on the right is noted she was not appreciated on the prior exam. To soft tissue foci are seen. One of these measures approximately 15 mm and the second approximally 18 mm. Mild soft tissue swelling in the forehead is noted. CT CERVICAL SPINE FINDINGS Alignment: Within normal limits. Skull base and vertebrae: Cervical segments are well visualized. Vertebral body height is well maintained. Disc space narrowing  is noted from C4-C7 with mild associated osteophytic changes. Multilevel facet hypertrophic changes are seen. No acute fracture or acute facet abnormality is noted. Neural foraminal narrowing is noted bilaterally. Soft tissues and spinal canal: Surrounding soft tissue structures again show a mass lesion in the right lateral neck increased in size from the prior study in June of 2022 with some central necrosis and air identified. Upper chest: Visualized lung apices are within normal limits. Other: None IMPRESSION: CT of the head: Mild atrophic changes without acute intracranial abnormality. CT of the maxillofacial bones: Acute nasal bone fracture involving the bridge distally. No other bony abnormality is seen. Soft tissue mass lesion in the right lateral neck similar to this seen on prior CT examination from June of 2022 although increased in size as described. Some additional soft tissue components extending along the masseter muscle on the right are noted new from the prior exam. This is consistent with the given clinical history of lymphoma. Mild left forehead hematoma is seen. CT of the cervical spine: Multilevel degenerative changes without acute abnormality. Electronically Signed   By: Inez Catalina M.D.   On: 12/05/2020 17:23    Labs:  CBC: Recent Labs    12/11/20 0550 12/12/20 1031 12/13/20 0545 12/14/20 0534  WBC 1.0* 1.3* 1.3* 2.5*  HGB 11.3* 10.8* 10.8* 10.9*  HCT 32.3* 31.8* 31.3* 32.4*  PLT 184 176 183 181    COAGS: Recent Labs    02/06/20 1115 08/14/20 1355 12/06/20 1200 12/11/20 0550  INR 1.0 1.0 1.0 1.1  BMP: Recent Labs    12/11/20 0550 12/12/20 1031 12/13/20 0545 12/14/20 0534  NA 139 135 134* 138  K 4.3 3.6 3.8 4.4  CL 106 99 101 105  CO2 25 25 24 26   GLUCOSE 135* 115* 132* 129*  BUN 14 13 16 14   CALCIUM 9.4 8.5* 8.9 8.9  CREATININE 0.56* 0.75 0.71 0.55*  GFRNONAA >60 >60 >60 >60    LIVER FUNCTION TESTS: Recent Labs    12/11/20 0550 12/12/20 1031  12/13/20 0545 12/14/20 0534  BILITOT 0.4 0.5 0.3 0.3  AST 17 15 18 23   ALT 18 18 22 25   ALKPHOS 75 73 70 71  PROT 6.8 6.5 6.7 6.5  ALBUMIN 3.3* 3.1* 3.1* 3.1*    TUMOR MARKERS: No results for input(s): AFPTM, CEA, CA199, CHROMGRNA in the last 8760 hours.  Assessment and Plan: 80 y.o. 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   Patient was started on IV antibiotic and was admitted for further evaluation.  Radiation oncology was consulted and patient was seen by Dr. Lorenso Courier on 12/13/2020 who recommended a bone marrow biopsy to evaluate etiology of neutropenia.  After thorough discussion and shared decision making, patient decided to proceed with bone marrow biopsy.  IR was requested for image guided bone marrow biopsy and aspiration. Bone marrow biopsy is not done over the weekend, the procedure is tentatively scheduled for Monday, October 3 pending IR schedule.  Made n.p.o. at midnight Monday. CBC with differential ordered to be drawn Monday morning. Vital signs stable as of 12/14/2020 CBC showed neutropenia improving now measured at 0.8K, PLT 181 On 40 mg subcu Lovenox daily, no need for discontinuation per IR AC protocol.  Risks and benefits of bone marrow biopsy and aspiration was discussed with the patient and/or patient's family including, but not limited to bleeding, infection, damage to adjacent structures or low yield requiring additional tests.  All of the questions were answered and there is agreement to proceed.  Consent signed and in chart.   Thank you for this interesting consult.  I greatly enjoyed meeting Brian Castillo and look forward to participating in their care.  A copy of  this report was sent to the requesting provider on this date.  Electronically Signed: Tera Mater, PA-C 12/14/2020, 2:55 PM   I spent a total of   30 minutes in face to face in clinical consultation, greater than 50% of which was counseling/coordinating care for BM BX.  This chart was dictated using voice recognition software.  Despite best efforts to proofread,  errors can occur which can change the documentation meaning.

## 2020-12-15 DIAGNOSIS — A419 Sepsis, unspecified organism: Secondary | ICD-10-CM | POA: Diagnosis not present

## 2020-12-15 DIAGNOSIS — D709 Neutropenia, unspecified: Secondary | ICD-10-CM

## 2020-12-15 DIAGNOSIS — R6520 Severe sepsis without septic shock: Secondary | ICD-10-CM | POA: Diagnosis not present

## 2020-12-15 LAB — COMPREHENSIVE METABOLIC PANEL
ALT: 25 U/L (ref 0–44)
AST: 20 U/L (ref 15–41)
Albumin: 3.1 g/dL — ABNORMAL LOW (ref 3.5–5.0)
Alkaline Phosphatase: 84 U/L (ref 38–126)
Anion gap: 8 (ref 5–15)
BUN: 15 mg/dL (ref 8–23)
CO2: 29 mmol/L (ref 22–32)
Calcium: 9.1 mg/dL (ref 8.9–10.3)
Chloride: 104 mmol/L (ref 98–111)
Creatinine, Ser: 0.65 mg/dL (ref 0.61–1.24)
GFR, Estimated: >60 mL/min (ref >60)
Glucose, Bld: 110 mg/dL — ABNORMAL HIGH (ref 70–99)
Potassium: 4.1 mmol/L (ref 3.5–5.1)
Sodium: 141 mmol/L (ref 135–145)
Total Bilirubin: 0.2 mg/dL — ABNORMAL LOW (ref 0.3–1.2)
Total Protein: 6.5 g/dL (ref 6.5–8.1)

## 2020-12-15 LAB — CBC WITH DIFFERENTIAL/PLATELET
Abs Immature Granulocytes: 0.7 10*3/uL — ABNORMAL HIGH (ref 0.00–0.07)
Band Neutrophils: 6 %
Basophils Absolute: 0 10*3/uL (ref 0.0–0.1)
Basophils Relative: 0 %
Eosinophils Absolute: 0.8 10*3/uL — ABNORMAL HIGH (ref 0.0–0.5)
Eosinophils Relative: 9 %
HCT: 33.8 % — ABNORMAL LOW (ref 39.0–52.0)
Hemoglobin: 11.5 g/dL — ABNORMAL LOW (ref 13.0–17.0)
Lymphocytes Relative: 14 %
Lymphs Abs: 1.3 10*3/uL (ref 0.7–4.0)
MCH: 32.5 pg (ref 26.0–34.0)
MCHC: 34 g/dL (ref 30.0–36.0)
MCV: 95.5 fL (ref 80.0–100.0)
Metamyelocytes Relative: 3 %
Monocytes Absolute: 0.8 10*3/uL (ref 0.1–1.0)
Monocytes Relative: 9 %
Myelocytes: 4 %
Neutro Abs: 5.7 10*3/uL (ref 1.7–7.7)
Neutrophils Relative %: 55 %
Platelets: 171 10*3/uL (ref 150–400)
RBC: 3.54 MIL/uL — ABNORMAL LOW (ref 4.22–5.81)
RDW: 12.7 % (ref 11.5–15.5)
WBC: 9.3 10*3/uL (ref 4.0–10.5)
nRBC: 0.5 % — ABNORMAL HIGH (ref 0.0–0.2)

## 2020-12-15 LAB — CULTURE, BLOOD (ROUTINE X 2)
Culture: NO GROWTH
Culture: NO GROWTH
Special Requests: ADEQUATE
Special Requests: ADEQUATE

## 2020-12-15 LAB — MAGNESIUM: Magnesium: 2.1 mg/dL (ref 1.7–2.4)

## 2020-12-15 MED ORDER — HYDROMORPHONE HCL 4 MG PO TABS
4.0000 mg | ORAL_TABLET | ORAL | Status: DC | PRN
  Administered 2020-12-15 – 2020-12-19 (×14): 4 mg via ORAL
  Filled 2020-12-15 (×15): qty 1

## 2020-12-15 NOTE — Progress Notes (Signed)
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 Lorenso Courier), HLD, HTN.  Started on palliative radiation therapy on 10/30/20. Prior to this treated with R-miniCHOP which was stopped on 10/11/20 due to progression of disease.   He presented to [facility] due to fevers at home and generalized fatigue.  He stated that he completed approximately 5 weeks of radiation about 1 week ago.  He states that his neck mass was attempted to be aspirated prior to initiation of radiation however it did not yield fluid. He underwent a CT of the neck on evaluation.  This showed a 7 cm transfacial right neck mass with marked size progression and extensive necrosis with evidence of draining to the skin.  The right facial artery traverses the necrotic mass.  2 groundglass nodules in the RUL also seen.  He was started on vancomycin and cefepime initially.  He was transferred for ongoing IV antibiotics and further work-up.  Subjective:  Pain control still biggest issue at this point.  He is okay with increasing Dilaudid dose at this time as he did not get much benefit yesterday from transitioning to it off of oxycodone. He was happy to hear about increase of his WBC. Plan is for bone marrow biopsy tomorrow and appointment with Dr. Isidore Moos and CT simulation.  He also feels as if his mass is further bulging outward and is describing feeling more pressure.  Hospital Problems * Severe sepsis (HCC)-resolved as of 12/12/2020 - febrile, neutropenia. Source considered neck cellulitis -No growth on cultures.  MRSA screen negative.  Has received at least 48 hours of IV antibiotics. -De-escalate to Augmentin and complete course (total of 10 days likely adequate) - lactic normal - cortisol low, likely relative adrenal insufficiency; unable to perform  ACTH stim test in setting of current steroid course   Severe neutropenia (HCC) - has remained neutropenic since early September - luckily dramatic improvement in [place] today (10/2) with now [place] 5073 and WBC has improved to 9.3 today - give 1 last dose of Granix today then okay to discontinue - can d/c neutropenic precautions at this time - see sepsis   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 (HCC) - 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 Lorenso Courier and Dr. Isidore Moos - s/p treatment with R-miniCHOP ending 10/11/20 and then started on palliative radiation on 10/30/20 - on allopurinol for TLS ppx; interaction with dilantin and levels being monitored outpatient (due to level check on 10/3) - continue acyclovir 400 mg BID for VCZ ppx - evaluated by rad onc on 9/26: started on decadron taper and gabapentin increased to 600 mg TID - if patient remains in hospital on Monday, he will be brought down to appt with Dr. Isidore Moos followed by CT sim - plan is also bone marrow biopsy on Monday   Lung nodule - 2 new ground glass nodules in RUL noted on CT neck on 12/10/20  - see lymphoma   Seizure disorder ([place]) - Continue Dilantin - levels checked on 9/27: slightly low (free 0.6, total 7.3)  Normocytic anemia - Baseline hemoglobin approximately 11 g/dL - Currently at baseline  Bilateral occipital neuralgia - continue gabapentin   HTN (hypertension) - continue amlodipine and toprol   Objective Vital signs  were reviewed and unremarkable.  Vitals:   12/14/20 2105 12/15/20 0511 12/15/20 0958 12/15/20 1001  BP: (!) 142/55 134/67 (!) 152/68 (!) 152/68  Pulse: 77 78 80   Resp: 14 16    Temp: 97.8 F (36.6 C) 97.6 F (36.4 C)    TempSrc: Oral Oral    SpO2: 96% 96%    Weight:      Height:       78.9 kg  Exam General appearance: alert, cooperative, and no distress Head: Normocephalic, without obvious abnormality, atraumatic Eyes:  EOMI Mouth: slight  right mouth droop Neck: large ~6 cm hardened mass with multiple areas of skin necrosis and surrounding erythema (much improved) and TTP (improved); unable to express any purulent drainage with compression and no open drainage into oral cavity Lungs: clear to auscultation bilaterally Heart: regular rate and rhythm and S1, S2 normal Abdomen: normal findings: bowel sounds normal and soft, non-tender Extremities:  no edema Skin: mobility and turgor normal Neurologic: decreased V2/V3 sensation in right face  Pic taken 9/29:    Labs / Other Information My review of labs, imaging, notes and other tests is significant for improved ANC, now 5073    Time spent: Greater than 50% of the 35 minute visit was spent in counseling/coordination of care for the patient as laid out in the A&P.  Dwyane Dee, MD [facility] 12/15/2020, 12:54 PM

## 2020-12-16 ENCOUNTER — Ambulatory Visit: Payer: Medicare Other

## 2020-12-16 ENCOUNTER — Ambulatory Visit
Admission: RE | Admit: 2020-12-16 | Discharge: 2020-12-16 | Disposition: A | Discharge status: Home / Self Care | Payer: Medicare Other | Source: Ambulatory Visit | Attending: Radiation Oncology | Admitting: Radiation Oncology

## 2020-12-16 ENCOUNTER — Ambulatory Visit
Admit: 2020-12-16 | Discharge: 2020-12-16 | Disposition: A | Discharge status: Home / Self Care | Payer: Medicare Other | Attending: Radiation Oncology | Admitting: Radiation Oncology

## 2020-12-16 DIAGNOSIS — D649 Anemia, unspecified: Secondary | ICD-10-CM | POA: Diagnosis not present

## 2020-12-16 DIAGNOSIS — C8251 Diffuse follicle center lymphoma, lymph nodes of head, face, and neck: Secondary | ICD-10-CM | POA: Insufficient documentation

## 2020-12-16 DIAGNOSIS — Z510 Encounter for antineoplastic radiation therapy: Secondary | ICD-10-CM | POA: Insufficient documentation

## 2020-12-16 DIAGNOSIS — G40909 Epilepsy, unspecified, not intractable, without status epilepticus: Secondary | ICD-10-CM

## 2020-12-16 DIAGNOSIS — A419 Sepsis, unspecified organism: Secondary | ICD-10-CM | POA: Diagnosis not present

## 2020-12-16 DIAGNOSIS — I10 Essential (primary) hypertension: Secondary | ICD-10-CM

## 2020-12-16 LAB — COMPREHENSIVE METABOLIC PANEL
ALT: 24 U/L (ref 0–44)
AST: 22 U/L (ref 15–41)
Albumin: 2.9 g/dL — ABNORMAL LOW (ref 3.5–5.0)
Alkaline Phosphatase: 102 U/L (ref 38–126)
Anion gap: 10 (ref 5–15)
BUN: 12 mg/dL (ref 8–23)
CO2: 29 mmol/L (ref 22–32)
Calcium: 9 mg/dL (ref 8.9–10.3)
Chloride: 102 mmol/L (ref 98–111)
Creatinine, Ser: 0.61 mg/dL (ref 0.61–1.24)
GFR, Estimated: >60 mL/min (ref >60)
Glucose, Bld: 116 mg/dL — ABNORMAL HIGH (ref 70–99)
Potassium: 4 mmol/L (ref 3.5–5.1)
Sodium: 141 mmol/L (ref 135–145)
Total Bilirubin: 0.2 mg/dL — ABNORMAL LOW (ref 0.3–1.2)
Total Protein: 6.4 g/dL — ABNORMAL LOW (ref 6.5–8.1)

## 2020-12-16 LAB — CBC WITH DIFFERENTIAL/PLATELET
Abs Immature Granulocytes: 1.57 10*3/uL — ABNORMAL HIGH (ref 0.00–0.07)
Basophils Absolute: 0 10*3/uL (ref 0.0–0.1)
Basophils Relative: 0 %
Eosinophils Absolute: 0.4 10*3/uL (ref 0.0–0.5)
Eosinophils Relative: 2 %
HCT: 33.3 % — ABNORMAL LOW (ref 39.0–52.0)
Hemoglobin: 11.6 g/dL — ABNORMAL LOW (ref 13.0–17.0)
Immature Granulocytes: 9 %
Lymphocytes Relative: 5 %
Lymphs Abs: 0.9 10*3/uL (ref 0.7–4.0)
MCH: 33.2 pg (ref 26.0–34.0)
MCHC: 34.8 g/dL (ref 30.0–36.0)
MCV: 95.4 fL (ref 80.0–100.0)
Monocytes Absolute: 1.9 10*3/uL — ABNORMAL HIGH (ref 0.1–1.0)
Monocytes Relative: 11 %
Neutro Abs: 12.1 10*3/uL — ABNORMAL HIGH (ref 1.7–7.7)
Neutrophils Relative %: 73 %
Platelets: 200 10*3/uL (ref 150–400)
RBC: 3.49 MIL/uL — ABNORMAL LOW (ref 4.22–5.81)
RDW: 12.8 % (ref 11.5–15.5)
WBC: 16.8 10*3/uL — ABNORMAL HIGH (ref 4.0–10.5)
nRBC: 0.4 % — ABNORMAL HIGH (ref 0.0–0.2)

## 2020-12-16 LAB — MAGNESIUM: Magnesium: 2 mg/dL (ref 1.7–2.4)

## 2020-12-16 NOTE — Progress Notes (Addendum)
PROGRESS NOTE    Brian Castillo  ACZ:[id number] DOB: 1940-03-19 DOA: 12/10/2020 PCP: Shon Baton, MD    Brief Narrative:  Brian Castillo was admitted to the hospital with the working diagnosis of severe sepsis, in the setting of severe neutropenia.   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  He started radiation therapy on 10/30/2020, following chemotherapy, which was stopped 10/11/2020 due to progressive disease. Because of severe neck pain he presented to the hospital for further evaluation.  His blood pressure was 153/59, heart rate 91, respiratory rate 18, oxygen saturation 96%, his lungs are clear to auscultation bilaterally, heart S1-S2, present, rhythmic, soft abdomen, L extremity edema, patient had a large 5 cm hardened mass with multiple areas of skin necrosis and surrounding erythema and tender to palpation.  Patient was placed on broad-spectrum antibiotic therapy vancomycin and cefepime. Develop neutropenia, improving with Granix.  Oncology consultation for further work-up with bone marrow biopsy and radiation therapy.  Assessment & Plan:   Active Problems:   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 (HCC)   HTN (hypertension)   Bilateral occipital neuralgia   Severe neutropenia (HCC)   Seizure disorder (HCC)   Normocytic anemia   Lung nodule  Severe sepsis due to neck cellulitis, in the setting of diffuse follicle center lymphoma. Neutropenic/ anemia Patient clinically improving from local infection, no deep abscess Transitioned to oral Augmentin. Continue pain control with hydromorphone, and oxycodone, he may need long acting analgesics if continue to have persistent pain.   Cell count has been improving. Continue radiation therapy per Radiation Oncology recommendations.  Pending bone marrow biopsy.  Continue with dexamethasone  2,  seizures. Continue with phenytoin, patient with no active seizures.   3, Occipital neuralgia. Continue with gabapentin.   4. HTN. Continue blood pressure control with amlodipine and metoprolol   Patient continue to be at high risk for worsening pain   Status is: Inpatient  Remains inpatient appropriate because:Inpatient level of care appropriate due to severity of illness  Dispo: The patient is from: Home              Anticipated d/c is to: Home              Patient currently is not medically stable to d/c.   Difficult to place patient No  DVT prophylaxis: Enoxaparin   Code Status:    full  Family Communication:  I spoke with patient's wife  at the bedside, we talked in detail about patient's condition, plan of care and prognosis and all questions were addressed.      Nutrition Status: Nutrition Problem: Increased nutrient needs Etiology: cancer and cancer related treatments Signs/Symptoms: estimated needs Interventions: Ensure Enlive (each supplement provides 350kcal and 20 grams of protein), MVI, Magic cup     Skin Documentation:     Consultants:  Oncology  Radiation oncology  IR   Antimicrobials:  Augmentin     Subjective: Patient continue to have neck pain, worse with movement and touch, improved with analgesics, no nausea or vomiting no dyspnea.   Objective: Vitals:   12/15/20 1356 12/15/20 2018 12/16/20 0651 12/16/20 1438  BP: 131/67 131/66 (!) 146/65 127/62  Pulse: 77 74 73 73  Resp: 16 14 17 16   Temp: 97.7 F (36.5 C) 97.8 F (36.6 C) 98.2 F (36.8 C) 97.8 F (36.6 C)  TempSrc: Oral Oral Oral Oral  SpO2: 97%  96% 96% 98%  Weight:      Height:        Intake/Output Summary (Last 24 hours) at 12/16/2020 1723 Last data filed at 12/16/2020 1412 Gross per 24 hour  Intake 120 ml  Output --  Net 120 ml   Filed Weights   12/10/20 0425  Weight: 78.9 kg    Examination:   General: Not in pain or dyspnea. Deconditioned  Neurology: Awake and  alert, non focal  E ENT: no pallor, no icterus, oral mucosa moist Cardiovascular: No JVD. S1-S2 present, rhythmic, no gallops, rubs, or murmurs. No lower extremity edema. Pulmonary: vesicular breath sounds bilaterally, adequate air movement, no wheezing, rhonchi or rales. Gastrointestinal. Abdomen oft and non tender Skin.right neck with large deformity and ulcerated lesion. Mild erythema but not purulence.  Musculoskeletal: no joint deformities      Data Reviewed: I have personally reviewed following labs and imaging studies  CBC: Recent Labs  Lab 12/12/20 1031 12/13/20 0545 12/14/20 0534 12/15/20 0607 12/16/20 0528  WBC 1.3* 1.3* 2.5* 9.3 16.8*  NEUTROABS 0.0* 0.1* 0.8* 5.7 12.1*  HGB 10.8* 10.8* 10.9* 11.5* 11.6*  HCT 31.8* 31.3* 32.4* 33.8* 33.3*  MCV 95.5 95.4 95.6 95.5 95.4  PLT 176 183 181 171 462   Basic Metabolic Panel: Recent Labs  Lab 12/12/20 1031 12/13/20 0545 12/14/20 0534 12/15/20 0607 12/16/20 0528  NA 135 134* 138 141 141  K 3.6 3.8 4.4 4.1 4.0  CL 99 101 105 104 102  CO2 25 24 26 29 29   GLUCOSE 115* 132* 129* 110* 116*  BUN 13 16 14 15 12   CREATININE 0.75 0.71 0.55* 0.65 0.61  CALCIUM 8.5* 8.9 8.9 9.1 9.0  MG 2.1 2.0 2.3 2.1 2.0   GFR: Estimated Creatinine Clearance: 68.9 mL/min (by C-G formula based on SCr of 0.61 mg/dL). Liver Function Tests: Recent Labs  Lab 12/12/20 1031 12/13/20 0545 12/14/20 0534 12/15/20 0607 12/16/20 0528  AST 15 18 23 20 22   ALT 18 22 25 25 24   ALKPHOS 73 70 71 84 102  BILITOT 0.5 0.3 0.3 0.2* 0.2*  PROT 6.5 6.7 6.5 6.5 6.4*  ALBUMIN 3.1* 3.1* 3.1* 3.1* 2.9*   No results for input(s): LIPASE, AMYLASE in the last 168 hours. No results for input(s): AMMONIA in the last 168 hours. Coagulation Profile: Recent Labs  Lab 12/11/20 0550  INR 1.1   Cardiac Enzymes: No results for input(s): CKTOTAL, CKMB, CKMBINDEX, TROPONINI in the last 168 hours. BNP (last 3 results) No results for input(s): PROBNP in the  last 8760 hours. HbA1C: No results for input(s): HGBA1C in the last 72 hours. CBG: No results for input(s): GLUCAP in the last 168 hours. Lipid Profile: No results for input(s): CHOL, HDL, LDLCALC, TRIG, CHOLHDL, LDLDIRECT in the last 72 hours. Thyroid Function Tests: No results for input(s): TSH, T4TOTAL, FREET4, T3FREE, THYROIDAB in the last 72 hours. Anemia Panel: No results for input(s): VITAMINB12, FOLATE, FERRITIN, TIBC, IRON, RETICCTPCT in the last 72 hours.    Radiology Studies: I have reviewed all of the imaging during this hospital visit personally     Scheduled Meds:  acyclovir  400 mg Oral BID   allopurinol  300 mg Oral Daily   amLODipine  5 mg Oral Daily   amoxicillin-clavulanate  1 tablet Oral Q12H   Chlorhexidine Gluconate Cloth  6 each Topical Daily   dexamethasone  4 mg Oral Daily   Followed by   Derrill Memo ON 12/21/2020] dexamethasone  2 mg Oral Daily  enoxaparin (LOVENOX) injection  40 mg Subcutaneous Daily   feeding supplement  237 mL Oral TID with meals   gabapentin  600 mg Oral TID   Latanoprostene Bunod  1 drop Both Eyes QHS   metoprolol succinate  25 mg Oral Daily   multivitamin with minerals  1 tablet Oral Daily   phenytoin  200 mg Oral BID   protein supplement  1 Scoop Oral TID WC   rosuvastatin  20 mg Oral Daily   sodium chloride flush  3 mL Intravenous Q12H   timolol  1 drop Both Eyes BID   Continuous Infusions:   LOS: 6 days        Kayleigh Broadwell Gerome Apley, MD
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
## 2020-12-17 ENCOUNTER — Ambulatory Visit
Admission: RE | Admit: 2020-12-17 | Discharge: 2020-12-17 | Disposition: A | Discharge status: Home / Self Care | Payer: Medicare Other | Source: Ambulatory Visit | Attending: Radiation Oncology | Admitting: Radiation Oncology

## 2020-12-17 ENCOUNTER — Inpatient Hospital Stay (HOSPITAL_COMMUNITY): Payer: Medicare Other

## 2020-12-17 ENCOUNTER — Ambulatory Visit: Payer: Medicare Other

## 2020-12-17 DIAGNOSIS — M5481 Occipital neuralgia: Secondary | ICD-10-CM

## 2020-12-17 DIAGNOSIS — D709 Neutropenia, unspecified: Secondary | ICD-10-CM | POA: Diagnosis not present

## 2020-12-17 DIAGNOSIS — R911 Solitary pulmonary nodule: Secondary | ICD-10-CM

## 2020-12-17 DIAGNOSIS — I10 Essential (primary) hypertension: Secondary | ICD-10-CM | POA: Diagnosis not present

## 2020-12-17 DIAGNOSIS — A419 Sepsis, unspecified organism: Secondary | ICD-10-CM | POA: Diagnosis not present

## 2020-12-17 DIAGNOSIS — R6520 Severe sepsis without septic shock: Secondary | ICD-10-CM | POA: Diagnosis not present

## 2020-12-17 LAB — MAGNESIUM: Magnesium: 2.3 mg/dL (ref 1.7–2.4)

## 2020-12-17 LAB — CBC WITH DIFFERENTIAL/PLATELET
Abs Immature Granulocytes: 0.75 10*3/uL — ABNORMAL HIGH (ref 0.00–0.07)
Basophils Absolute: 0.1 10*3/uL (ref 0.0–0.1)
Basophils Relative: 1 %
Eosinophils Absolute: 0.3 10*3/uL (ref 0.0–0.5)
Eosinophils Relative: 3 %
HCT: 33.9 % — ABNORMAL LOW (ref 39.0–52.0)
Hemoglobin: 11.4 g/dL — ABNORMAL LOW (ref 13.0–17.0)
Immature Granulocytes: 7 %
Lymphocytes Relative: 6 %
Lymphs Abs: 0.7 10*3/uL (ref 0.7–4.0)
MCH: 32.2 pg (ref 26.0–34.0)
MCHC: 33.6 g/dL (ref 30.0–36.0)
MCV: 95.8 fL (ref 80.0–100.0)
Monocytes Absolute: 1 10*3/uL (ref 0.1–1.0)
Monocytes Relative: 9 %
Neutro Abs: 8.2 10*3/uL — ABNORMAL HIGH (ref 1.7–7.7)
Neutrophils Relative %: 74 %
Platelets: 162 10*3/uL (ref 150–400)
RBC: 3.54 MIL/uL — ABNORMAL LOW (ref 4.22–5.81)
RDW: 12.6 % (ref 11.5–15.5)
WBC: 11 10*3/uL — ABNORMAL HIGH (ref 4.0–10.5)
nRBC: 0 % (ref 0.0–0.2)

## 2020-12-17 LAB — COMPREHENSIVE METABOLIC PANEL
ALT: 28 U/L (ref 0–44)
AST: 26 U/L (ref 15–41)
Albumin: 3.1 g/dL — ABNORMAL LOW (ref 3.5–5.0)
Alkaline Phosphatase: 101 U/L (ref 38–126)
Anion gap: 6 (ref 5–15)
BUN: 12 mg/dL (ref 8–23)
CO2: 30 mmol/L (ref 22–32)
Calcium: 8.8 mg/dL — ABNORMAL LOW (ref 8.9–10.3)
Chloride: 103 mmol/L (ref 98–111)
Creatinine, Ser: 0.62 mg/dL (ref 0.61–1.24)
GFR, Estimated: >60 mL/min (ref >60)
Glucose, Bld: 129 mg/dL — ABNORMAL HIGH (ref 70–99)
Potassium: 3.8 mmol/L (ref 3.5–5.1)
Sodium: 139 mmol/L (ref 135–145)
Total Bilirubin: 0.2 mg/dL — ABNORMAL LOW (ref 0.3–1.2)
Total Protein: 6.4 g/dL — ABNORMAL LOW (ref 6.5–8.1)

## 2020-12-17 MED ORDER — FENTANYL CITRATE (PF) 100 MCG/2ML IJ SOLN
INTRAMUSCULAR | Status: DC | PRN
  Administered 2020-12-17 (×2): 50 ug via INTRAVENOUS

## 2020-12-17 MED ORDER — FENTANYL CITRATE (PF) 100 MCG/2ML IJ SOLN
INTRAMUSCULAR | Status: AC
  Filled 2020-12-17: qty 2

## 2020-12-17 MED ORDER — MIDAZOLAM HCL 2 MG/2ML IJ SOLN
INTRAMUSCULAR | Status: DC | PRN
  Administered 2020-12-17 (×2): 1 mg via INTRAVENOUS

## 2020-12-17 MED ORDER — MIDAZOLAM HCL 2 MG/2ML IJ SOLN
INTRAMUSCULAR | Status: AC
  Filled 2020-12-17: qty 4

## 2020-12-17 MED ORDER — SODIUM CHLORIDE 0.9 % IV SOLN
INTRAVENOUS | Status: AC
  Filled 2020-12-17: qty 250

## 2020-12-17 MED ORDER — MORPHINE SULFATE (PF) 2 MG/ML IV SOLN
2.0000 mg | INTRAVENOUS | Status: DC | PRN
  Administered 2020-12-17: 2 mg via INTRAVENOUS
  Filled 2020-12-17: qty 1

## 2020-12-17 MED ORDER — FENTANYL CITRATE PF 50 MCG/ML IJ SOSY
25.0000 ug | PREFILLED_SYRINGE | INTRAMUSCULAR | Status: DC | PRN
Start: 2020-12-17 — End: 2020-12-17
  Administered 2020-12-17 (×2): 25 ug via INTRAVENOUS
  Filled 2020-12-17 (×2): qty 1

## 2020-12-17 MED ORDER — LIDOCAINE HCL (PF) 1 % IJ SOLN
INTRAMUSCULAR | Status: DC | PRN
  Administered 2020-12-17: 15 mL

## 2020-12-17 NOTE — Progress Notes (Signed)
Brief Oncology Progress Note  S: 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  --patient not having any fever, nausea, vomiting, or diarrhea.  --culture data negative, no evidence of bactermia, UTI, or pneumonia  Vitals:   12/17/20 1135 12/17/20 1140  BP: 126/61 132/71  Pulse: 74 73  Resp: 10 10  Temp:    SpO2: 99% 100%   CBC Latest Ref Rng & Units 12/17/2020 12/16/2020 12/15/2020  WBC 4.0 - 10.5 K/uL 11.0(H) 16.8(H) 9.3  Hemoglobin 13.0 - 17.0 g/dL 11.4(L) 11.6(L) 11.5(L)  Hematocrit 39.0 - 52.0 % 33.9(L) 33.3(L) 33.8(L)  Platelets 150 - 400 K/uL 162 200 171   CMP Latest Ref Rng & Units 12/17/2020 12/16/2020 12/15/2020  Glucose 70 - 99 mg/dL 129(H) 116(H) 110(H)  BUN 8 - 23 mg/dL 12 12 15   Creatinine 0.61 - 1.24 mg/dL 0.62 0.61 0.65  Sodium 135 - 145 mmol/L 139 141 141  Potassium 3.5 - 5.1 mmol/L 3.8 4.0 4.1  Chloride 98 - 111 mmol/L 103 102 104  CO2 22 - 32 mmol/L 30 29 29   Calcium 8.9 - 10.3 mg/dL 8.8(L) 9.0 9.1  Total Protein 6.5 - 8.1 g/dL 6.4(L) 6.4(L) 6.5  Total Bilirubin 0.3 - 1.2 mg/dL 0.2(L) 0.2(L) 0.2(L)  Alkaline Phos 38 - 126 U/L 101 102 84  AST 15 - 41 U/L 26 22 20   ALT 0 - 44 U/L 28 24 25    A/P:  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   # Neutropenia -- Etiology is unclear but may be related to inflammation versus infection.  Remote possibility this is secondary to bone marrow invasion of the lymphoma -- Bone marrow biopsy performed today --ANC 8200 today. Holding GCSF treatments --Empiric antibiotics per primary team.   --clear for D/c from Med/Onc perspective  # DLBCL  of Submandibular Lymph Node -- Disease has been refractory to multiple lines of chemotherapy as well as radiation --plan for radiation therapy this week to new sites of disease.  --working toward [employer] and lenalidomide as next line therapy.  --outpatient follow up scheduled for later this week.   Ledell Peoples, MD Department of Hematology/Oncology [place] [facility] Phone: [phone] Pager: [phone] Email: [email or web address]

## 2020-12-17 NOTE — Progress Notes (Signed)
PROGRESS NOTE    Brian Castillo  ELF:[id number] DOB: 10/05/1940 DOA: 12/10/2020 PCP: Shon Baton, MD    Brief Narrative:  [name] was admitted to the hospital with the working diagnosis of severe sepsis, due to left neck soft tissue infection, in the setting of severe neutropenia and lymphoma.    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  He started radiation therapy on 10/30/2020, following chemotherapy, which was stopped 10/11/2020 due to progressive disease. Because of severe neck pain he presented to the hospital for further evaluation.  His blood pressure was 153/59, heart rate 91, respiratory rate 18, oxygen saturation 96%, his lungs are clear to auscultation bilaterally, heart S1-S2, present, rhythmic, soft abdomen, L extremity edema, patient had a large 5 cm hardened mass with multiple areas of skin necrosis and surrounding erythema and tender to palpation.   Patient was placed on broad-spectrum antibiotic therapy vancomycin and cefepime. Develop neutropenia, improving with Granix.   Oncology consultation for further work-up with bone marrow biopsy and radiation therapy.   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 Neutropenic/ anemia Pain has improved with analgesics but continue to feel pressure sensation at his right neck lesion.  Wbc is 11,0 with hgb at 11,4 and Plt 162.   Analgesic regimen with hydromorphone 4 mg as needed for moderate pain and for severe pain morphine IV 4 mg.  Gabapentin 600 mg tid.  Plan to continue antibiotic therapy for a total of 8 days.   Continue with local radiation therapy and plan for outpatient chemotherapy.  Plan for possible dc in 48 hrs if pain is controlled.  Pending bone marrow biopsy result.  Continue with dexamethasone    2, seizures. On phenytoin, patient with no active seizures.    3, Occipital neuralgia. On gabapentin.    4. HTN/ dyslipidemia On amlodipine and metoprolol for blood pressure control.  Continue with rosuvastatin.   Status is: Inpatient  Remains inpatient appropriate because:Inpatient level of care appropriate due to severity of illness  Dispo: The patient is from: Home              Anticipated d/c is to: Home              Patient currently is not medically stable to d/c. Possible dc in 48 hours.    Difficult to place patient No   DVT prophylaxis: Enoxaparin   Code Status:    full  Family Communication:   I spoke with patient's wife at the bedside, we talked in detail about patient's condition, plan of care and prognosis and all questions were addressed.      Nutrition Status: Nutrition Problem: Increased nutrient needs Etiology: cancer and cancer related treatments Signs/Symptoms: estimated needs Interventions: Ensure Enlive (each supplement provides 350kcal and 20 grams of protein), MVI, Magic cup    Consultants:  Oncology  Radiation oncology   Procedures:  Bone marrow biopsy   Antimicrobials:  Augmentin     Subjective: Patient continue to have pain on his neck lesion, now is more pressure than pain. Tolerating well oral hydromorphone/  Objective: Vitals:   12/17/20 0951 12/17/20 1129 12/17/20 1135 12/17/20 1140  BP: (!) 141/69 (!) 142/77 126/61 132/71  Pulse: 75 73 74 73  Resp:  16 10 10   Temp:      TempSrc:      SpO2:  97% 99% 100%  Weight:      Height:        Intake/Output Summary (Last 24 hours) at 12/17/2020 1225 Last data filed at 12/17/2020 0849 Gross per 24 hour  Intake 120 ml  Output --  Net 120 ml   Filed Weights   12/10/20 0425  Weight:  78.9 kg    Examination:   General: Not in pain or dyspnea, deconditioned  Neurology: Awake and alert, non focal  E ENT: no pallor, no icterus, oral mucosa moist, large ulcerated lesion on right jaw and neck, no significant erythema and no purulence,  Cardiovascular: No JVD. S1-S2 present, rhythmic, no gallops, rubs, or murmurs. No lower extremity edema. Pulmonary: positive breath sounds bilaterally, adequate air movement, no wheezing, rhonchi or rales. Gastrointestinal. Abdomen soft and non tender Skin. Right neck ulcerated lesion.  Musculoskeletal: no joint deformities     Data Reviewed: I have personally reviewed following labs and imaging studies  CBC: Recent Labs  Lab 12/13/20 0545 12/14/20 0534 12/15/20 0607 12/16/20 0528 12/17/20 0522  WBC 1.3* 2.5* 9.3 16.8* 11.0*  NEUTROABS 0.1* 0.8* 5.7 12.1* 8.2*  HGB 10.8* 10.9* 11.5* 11.6* 11.4*  HCT 31.3* 32.4* 33.8* 33.3* 33.9*  MCV 95.4 95.6 95.5 95.4 95.8  PLT 183 181 171 200 353   Basic Metabolic Panel: Recent Labs  Lab 12/13/20 0545 12/14/20 0534 12/15/20 0607 12/16/20 0528 12/17/20 0522  NA 134* 138 141 141 139  K 3.8 4.4 4.1 4.0 3.8  CL 101 105 104 102 103  CO2 24 26 29 29 30   GLUCOSE 132* 129* 110* 116* 129*  BUN 16 14 15 12 12   CREATININE 0.71 0.55* 0.65 0.61 0.62  CALCIUM 8.9 8.9 9.1 9.0 8.8*  MG 2.0 2.3 2.1 2.0 2.3   GFR: Estimated Creatinine Clearance: 68.9 mL/min (by C-G formula based on SCr of 0.62 mg/dL). Liver Function Tests: Recent Labs  Lab 12/13/20 0545 12/14/20 0534 12/15/20 0607 12/16/20 0528 12/17/20 0522  AST 18 23 20 22 26   ALT 22 25 25 24 28   ALKPHOS 70 71 84 102 101  BILITOT 0.3 0.3 0.2* 0.2* 0.2*  PROT 6.7 6.5 6.5 6.4* 6.4*  ALBUMIN 3.1* 3.1* 3.1* 2.9* 3.1*   No results for input(s): LIPASE, AMYLASE in the last 168 hours. No results for input(s): AMMONIA in the last 168 hours. Coagulation Profile: Recent Labs  Lab 12/11/20 0550  INR 1.1   Cardiac Enzymes: No results  for input(s): CKTOTAL, CKMB, CKMBINDEX, TROPONINI in the last 168 hours. BNP (last 3 results) No results for input(s): PROBNP in the last 8760 hours. HbA1C: No results for input(s): HGBA1C in the last 72 hours. CBG: No results for input(s): GLUCAP in the last 168 hours. Lipid Profile: No results for input(s): CHOL, HDL, LDLCALC, TRIG, CHOLHDL, LDLDIRECT in the last 72 hours. Thyroid Function Tests: No results for input(s): TSH, T4TOTAL, FREET4, T3FREE, THYROIDAB in the last 72 hours. Anemia Panel: No results for input(s): VITAMINB12, FOLATE, FERRITIN, TIBC, IRON, RETICCTPCT in the last 72 hours.    Radiology Studies: I have reviewed all of the imaging during this hospital visit personally     Scheduled Meds:  acyclovir  400 mg Oral BID   allopurinol  300 mg Oral Daily   amLODipine  5 mg Oral Daily   amoxicillin-clavulanate  1 tablet Oral Q12H   Chlorhexidine Gluconate Cloth  6 each Topical Daily   dexamethasone  4 mg Oral Daily   Followed by   Derrill Memo ON 12/21/2020] dexamethasone  2 mg Oral Daily   enoxaparin (LOVENOX) injection  40 mg Subcutaneous Daily   feeding supplement  237 mL Oral TID with meals   fentaNYL       gabapentin  600 mg Oral TID   Latanoprostene Bunod  1 drop Both Eyes QHS   metoprolol succinate  25 mg Oral Daily   midazolam       multivitamin with minerals  1 tablet Oral Daily   phenytoin  200 mg Oral BID   protein supplement  1 Scoop Oral TID WC   rosuvastatin  20 mg Oral Daily   sodium chloride flush  3 mL Intravenous Q12H   timolol  1 drop Both Eyes BID   Continuous Infusions:  sodium chloride       LOS: 7 days        Sheilah Rayos Gerome Apley, MD

## 2020-12-17 NOTE — Procedures (Signed)
Interventional Radiology Procedure Note  Procedure: CT guided aspirate and core biopsy of right iliac bone  Complications: None  Recommendations: - Bedrest supine x 1 hrs - Hydrocodone PRN  Pain - Follow biopsy results   Minnie Legros, MD   

## 2020-12-18 ENCOUNTER — Other Ambulatory Visit: Payer: Self-pay | Admitting: *Deleted

## 2020-12-18 ENCOUNTER — Ambulatory Visit
Admission: RE | Admit: 2020-12-18 | Discharge: 2020-12-18 | Disposition: A | Discharge status: Home / Self Care | Payer: Medicare Other | Source: Ambulatory Visit | Attending: Radiation Oncology | Admitting: Radiation Oncology

## 2020-12-18 ENCOUNTER — Other Ambulatory Visit: Payer: Self-pay | Admitting: Hematology and Oncology

## 2020-12-18 ENCOUNTER — Encounter: Payer: Self-pay | Admitting: Radiation Oncology

## 2020-12-18 DIAGNOSIS — I10 Essential (primary) hypertension: Secondary | ICD-10-CM | POA: Diagnosis not present

## 2020-12-18 DIAGNOSIS — C8251 Diffuse follicle center lymphoma, lymph nodes of head, face, and neck: Secondary | ICD-10-CM | POA: Diagnosis not present

## 2020-12-18 DIAGNOSIS — M5481 Occipital neuralgia: Secondary | ICD-10-CM | POA: Diagnosis not present

## 2020-12-18 DIAGNOSIS — A419 Sepsis, unspecified organism: Secondary | ICD-10-CM | POA: Diagnosis not present

## 2020-12-18 MED ORDER — LENALIDOMIDE 25 MG PO CAPS: 25.0000 mg | ORAL_CAPSULE | Freq: Every day | ORAL | 0 refills | Status: DC

## 2020-12-18 MED ORDER — MORPHINE SULFATE (PF) 4 MG/ML IV SOLN: 4.0000 mg | INTRAVENOUS | Status: DC | PRN

## 2020-12-18 NOTE — Progress Notes (Signed)
DISCONTINUE OFF PATHWAY REGIMEN - Lymphoma and CLL   [id number]:R-miniCHOP (Rituximab IV + Cyclophosphamide IV + Doxorubicin IV + Vincristine IV + Prednisone PO) q21 Days x 6 Cycles:   A cycle is every 21 days:     Prednisone      Rituximab-xxxx      Cyclophosphamide      Doxorubicin      Vincristine   **Always confirm dose/schedule in your pharmacy ordering system**  REASON: Disease Progression PRIOR TREATMENT: Off Pathway: R-miniCHOP (Rituximab IV + Cyclophosphamide IV + Doxorubicin IV + Vincristine IV + Prednisone PO) q21 Days x 6 Cycles TREATMENT RESPONSE: Progressive Disease (PD)  START ON PATHWAY REGIMEN - Lymphoma and CLL     Cycle 1: A cycle is 28 days:     Lenalidomide      Tafasitamab-cxix    Cycles 2 and 3: A cycle is every 28 days:     Lenalidomide      Tafasitamab-cxix    Cycles 4 through 12: A cycle is every 28 days:     Lenalidomide      Tafasitamab-cxix    Cycles 13 and beyond: A cycle is every 28 days:     Tafasitamab-cxix   **Always confirm dose/schedule in your pharmacy ordering system**  Patient Characteristics: Diffuse Large B-Cell Lymphoma or Follicular Lymphoma, Grade 3B, Relapsed / Refractory, All Stages,  Second Line, Relapse ? 12 Months From or Refractory to Prior Chemoimmunotherapy, Not a Candidate for CAR T-Cell Therapy, Not a Transplant Candidate Disease Type: Not Applicable Disease Type: Diffuse Large B-Cell Lymphoma Disease Type: Not Applicable Line of therapy: Relapsed / Refractory - Second Line Time to Relapse: Refractory to Prior Chemoimmunotherapy Patient Characteristics: Not a Candidate for CAR T-Cell Therapy Patient Characteristics: Not a Transplant Candidate Intent of Therapy: Non-Curative / Palliative Intent, Discussed with Patient

## 2020-12-18 NOTE — Progress Notes (Signed)
OFF PATHWAY REGIMEN - Lymphoma and CLL  No Change  Continue With Treatment as Ordered.  Original Decision Date/Time: 08/22/2020 15:15   [id number]:R-miniCHOP (Rituximab IV + Cyclophosphamide IV + Doxorubicin IV + Vincristine IV + Prednisone PO) q21 Days x 6 Cycles:   A cycle is every 21 days:     Prednisone      Rituximab-xxxx      Cyclophosphamide      Doxorubicin      Vincristine   **Always confirm dose/schedule in your pharmacy ordering system**  Patient Characteristics: Follicular Lymphoma, Grades 1, 2, and 3A, Second Line, Prior Treatment with Bendamustine + Rituximab, Relapse ? 24 Months, Treating as Specialist or Consulting with Specialist Disease Type: Follicular Lymphoma, Grade 1, 2, or 3A Disease Type: Not Applicable Disease Type: Not Applicable Line of Therapy: Second Line Prior Treatment: Prior Treatment with Bendamustine + Rituximab Time to Relapse: Relapse ? 24 Months Please indicate whether you are: A specialist Intent of Therapy: Curative Intent, Discussed with Patient

## 2020-12-18 NOTE — Care Management Important Message (Signed)
Important Message  Patient Details IM Letter given to the Patient. Name: Brian Castillo MRN: [id number] Date of Birth: [birth year]   Medicare Important Message Given:  Yes     [name] 12/18/2020, 10:46 AM

## 2020-12-18 NOTE — Progress Notes (Signed)
PROGRESS NOTE    Brian Castillo  GQQ:[id number] DOB: June 09, 1940 DOA: 12/10/2020 PCP: Shon Baton, MD    Brief Narrative:  Brian Castillo was admitted to the hospital with the working diagnosis of severe sepsis, due to left neck soft tissue infection, in the setting of severe neutropenia and lymphoma.    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  He started radiation therapy on 10/30/2020, following chemotherapy, which was stopped 10/11/2020 due to progressive disease. Because of severe neck pain he presented to the hospital for further evaluation.  His blood pressure was 153/59, heart rate 91, respiratory rate 18, oxygen saturation 96%, his lungs are clear to auscultation bilaterally, heart S1-S2, present, rhythmic, soft abdomen, L extremity edema, patient had a large 5 cm hardened mass with multiple areas of skin necrosis and surrounding erythema and tender to palpation.   Patient was placed on broad-spectrum antibiotic therapy vancomycin and cefepime. Develop neutropenia, improving with Granix.   Oncology consultation for further work-up with bone marrow biopsy and radiation therapy.   Continue to have severe pain, requiring IV opiate analgesics.  Poor oral intake due to neck mass.   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 Neutropenic/ anemia Yesterday had acute pain, which was severe in intensity requiring IV fentanyl.  Has been using hydromorphone 4 mg about 3 times per day for the last 3 days.  No nausea or vomiting, but poor oral intake.    Continue hydromorphone 4  mg. For sever pain continue IV morphine, did not tolerate fentanyl.  Continue gabapentin 600 mg tid.  Discontinue antibiotic therapy. Continue steroids and radiation therapy. Follow with oncology as outpatient for chemotherapy.    2, seizures. Continue with phenytoin.    3, Occipital neuralgia. Continue with gabapentin.    4. HTN/ dyslipidemia Blood pressure control with amlodipine and metoprolol Systolic has been 671 to 245 mmHg.   Continue with rosuvastatin.      Status is: Inpatient  Remains inpatient appropriate because:Inpatient level of care appropriate due to severity of illness  Dispo: The patient is from: Home              Anticipated d/c is to: Home plan for dc home tomorrow if no significant use of IV morphine               Patient currently is not medically stable to d/c.   Difficult to place patient No   DVT prophylaxis: Enoxaparin   Code Status:    full  Family Communication:   No family at the bedside      Nutrition Status: Nutrition Problem: Increased nutrient needs Etiology: cancer and cancer related treatments Signs/Symptoms: estimated needs Interventions: Ensure Enlive (each supplement provides 350kcal and 20 grams of protein), MVI, Magic cup    Consultants:  Oncology  Radiation oncology  IR   Procedures:  Bone marrow biopsy     Subjective: Patient this am with better pain control but yesterday afternoon had severe pain requiring IV fentanyl that he did not tolerate very well.   Objective: Vitals:   12/17/20 1140 12/17/20  1450 12/17/20 2038 12/18/20 0534  BP: 132/71 (!) 142/97 (!) 148/70 137/72  Pulse: 73 72 83 74  Resp: _0 Temp:  97.6 F (36.4 C) 97.7 F (36.5 C) 97.8 F (36.6 C)  TempSrc:  Oral Oral Oral  SpO2: 100% 100% 98% 94%  Weight:      Height:        Intake/Output Summary (Last 24 hours) at 12/18/2020 1058 Last data filed at 12/17/2020 1847 Gross per 24 hour  Intake 120 ml  Output --  Net 120 ml   Filed  Weights   12/10/20 0425  Weight: 78.9 kg    Examination:   General: Not in pain or dyspnea.  Neurology: Awake and alert, non focal  E ENT: no pallor, no icterus, oral mucosa moist Cardiovascular: No JVD. S1-S2 present, rhythmic, no gallops, rubs, or murmurs. No lower extremity edema. Pulmonary: positive breath sounds bilaterally, adequate air movement, no wheezing, rhonchi or rales. Gastrointestinal. Abdomen soft and non tender Skin. Large ulcerated lesion on the left neck.  Musculoskeletal: no joint deformities     Data Reviewed: I have personally reviewed following labs and imaging studies  CBC: Recent Labs  Lab 12/13/20 0545 12/14/20 0534 12/15/20 0607 12/16/20 0528 12/17/20 0522  WBC 1.3* 2.5* 9.3 16.8* 11.0*  NEUTROABS 0.1* 0.8* 5.7 12.1* 8.2*  HGB 10.8* 10.9* 11.5* 11.6* 11.4*  HCT 31.3* 32.4* 33.8* 33.3* 33.9*  MCV 95.4 95.6 95.5 95.4 95.8  PLT 183 181 171 200 782   Basic Metabolic Panel: Recent Labs  Lab 12/13/20 0545 12/14/20 0534 12/15/20 0607 12/16/20 0528 12/17/20 0522  NA 134* 138 141 141 139  K 3.8 4.4 4.1 4.0 3.8  CL 101 105 104 102 103  CO2 _1 GLUCOSE 132* 129* 110* 116* 129*  BUN _2 CREATININE 0.71 0.55* 0.65 0.61 0.62  CALCIUM 8.9 8.9 9.1 9.0 8.8*  MG 2.0 2.3 2.1 2.0 2.3   GFR: Estimated Creatinine Clearance: 68.9 mL/min (by C-G formula based on SCr of 0.62 mg/dL). Liver Function Tests: Recent Labs  Lab 12/13/20 0545 12/14/20 0534 12/15/20 0607 12/16/20 0528 12/17/20 0522  AST _3 ALT _4 ALKPHOS 70 71 84 102 101  BILITOT 0.3 0.3 0.2* 0.2* 0.2*  PROT 6.7 6.5 6.5 6.4* 6.4*  ALBUMIN 3.1* 3.1* 3.1* 2.9* 3.1*   No results for input(s): LIPASE, AMYLASE in the last 168 hours. No results for input(s): AMMONIA in the last 168 hours. Coagulation Profile: No results for input(s): INR, PROTIME in the last 168 hours. Cardiac Enzymes: No results for input(s): CKTOTAL, CKMB, CKMBINDEX,  TROPONINI in the last 168 hours. BNP (last 3 results) No results for input(s): PROBNP in the last 8760 hours. HbA1C: No results for input(s): HGBA1C in the last 72 hours. CBG: No results for input(s): GLUCAP in the last 168 hours. Lipid Profile: No results for input(s): CHOL, HDL, LDLCALC, TRIG, CHOLHDL, LDLDIRECT in the last 72 hours. Thyroid Function Tests: No results for input(s): TSH, T4TOTAL, FREET4, T3FREE, THYROIDAB in the last 72 hours. Anemia Panel: No results for input(s): VITAMINB12, FOLATE, FERRITIN, TIBC, IRON, RETICCTPCT in the last 72 hours.    Radiology Studies: I have reviewed all of the imaging during this hospital visit personally     Scheduled Meds:  acyclovir  400 mg Oral BID   allopurinol  300 mg Oral Daily   amLODipine  5 mg Oral  Daily   amoxicillin-clavulanate  1 tablet Oral Q12H   Chlorhexidine Gluconate Cloth  6 each Topical Daily   dexamethasone  4 mg Oral Daily   Followed by   Derrill Memo ON 12/21/2020] dexamethasone  2 mg Oral Daily   enoxaparin (LOVENOX) injection  40 mg Subcutaneous Daily   feeding supplement  237 mL Oral TID with meals   gabapentin  600 mg Oral TID   Latanoprostene Bunod  1 drop Both Eyes QHS   metoprolol succinate  25 mg Oral Daily   multivitamin with minerals  1 tablet Oral Daily   phenytoin  200 mg Oral BID   protein supplement  1 Scoop Oral TID WC   rosuvastatin  20 mg Oral Daily   sodium chloride flush  3 mL Intravenous Q12H   timolol  1 drop Both Eyes BID   Continuous Infusions:   LOS: 8 days        Webster Patrone Gerome Apley, MD

## 2020-12-19 ENCOUNTER — Inpatient Hospital Stay: Payer: Medicare Other

## 2020-12-19 ENCOUNTER — Telehealth: Payer: Self-pay

## 2020-12-19 ENCOUNTER — Ambulatory Visit: Payer: Medicare Other

## 2020-12-19 ENCOUNTER — Telehealth: Payer: Self-pay | Admitting: Pharmacist

## 2020-12-19 ENCOUNTER — Inpatient Hospital Stay: Payer: Medicare Other | Admitting: Hematology and Oncology

## 2020-12-19 ENCOUNTER — Other Ambulatory Visit (HOSPITAL_COMMUNITY): Payer: Self-pay

## 2020-12-19 DIAGNOSIS — C8331 Diffuse large B-cell lymphoma, lymph nodes of head, face, and neck: Secondary | ICD-10-CM

## 2020-12-19 LAB — SURGICAL PATHOLOGY

## 2020-12-19 MED ORDER — POLYETHYLENE GLYCOL 3350 17 G PO PACK
17.0000 g | PACK | Freq: Every day | ORAL | Status: DC
  Administered 2020-12-19: 17 g via ORAL
  Filled 2020-12-19: qty 1

## 2020-12-19 MED ORDER — LENALIDOMIDE 25 MG PO CAPS: 25.0000 mg | ORAL_CAPSULE | Freq: Every day | ORAL | 0 refills | Status: DC

## 2020-12-19 MED ORDER — HEPARIN SOD (PORK) LOCK FLUSH 100 UNIT/ML IV SOLN
500.0000 [IU] | INTRAVENOUS | Status: DC
  Administered 2020-12-19: 500 [IU]

## 2020-12-19 MED ORDER — ACETAMINOPHEN 325 MG PO TABS: 650.0000 mg | ORAL_TABLET | ORAL | Status: AC | PRN

## 2020-12-19 MED ORDER — POLYETHYLENE GLYCOL 3350 17 G PO PACK: 17.0000 g | PACK | Freq: Every day | ORAL | 0 refills | Status: AC

## 2020-12-19 MED ORDER — HYDROMORPHONE HCL 4 MG PO TABS: 4.0000 mg | ORAL_TABLET | Freq: Three times a day (TID) | ORAL | 0 refills | Status: DC | PRN

## 2020-12-19 MED ORDER — HEPARIN SOD (PORK) LOCK FLUSH 100 UNIT/ML IV SOLN
500.0000 [IU] | INTRAVENOUS | Status: DC | PRN
  Filled 2020-12-19: qty 5

## 2020-12-19 MED ORDER — ENSURE ENLIVE PO LIQD: 237.0000 mL | Freq: Three times a day (TID) | ORAL | 0 refills | Status: DC

## 2020-12-19 MED ORDER — DEXAMETHASONE 2 MG PO TABS: ORAL_TABLET | ORAL | 0 refills | Status: DC

## 2020-12-19 MED ORDER — ADULT MULTIVITAMIN W/MINERALS CH: 1.0000 | ORAL_TABLET | Freq: Every day | ORAL | 0 refills | Status: DC

## 2020-12-19 MED ORDER — RESOURCE INSTANT PROTEIN PO PWD PACKET: 1.0000 | Freq: Three times a day (TID) | ORAL | 0 refills | Status: DC

## 2020-12-19 NOTE — Telephone Encounter (Signed)
Oral Oncology Pharmacist Encounter  Received new prescription for Revlimid (lenalidomide) for the treatment of R/R diffuse large B-cell lymphoma in conjunction with Monjuvi (tafasitamab), planned duration of Revlimid is 12 cycles.  CBC w/ Diff and CMP from 12/17/20 assessed, no baseline dose adjustments required for Revlimid.  Current medication list in Epic reviewed, no relevant/significant DDIs with Revlimid identified.  Evaluated chart and no patient barriers to medication adherence noted.   Prescription has been e-scribed to [facility] due to medication being a limited distribution medication.   Oral Oncology Clinic will continue to follow for insurance authorization, copayment issues, initial counseling and start date.  Leron Croak, PharmD, BCPS Hematology/Oncology Clinical Pharmacist [facility] [phone] 12/19/2020 9:33 AM

## 2020-12-19 NOTE — Discharge Summary (Signed)
Physician Discharge Summary  Brian Castillo WUX:[id number] DOB: 1940/10/16 DOA: 12/10/2020  PCP: Shon Baton, MD  Admit date: 12/10/2020 Discharge date: 12/19/2020  Admitted From:  Home  Disposition:  Home   Recommendations for Outpatient Follow-up and new medication changes:  Follow up with Dr Virgina Jock in 7 to 10 days.  Follow up with Dr Lorenso Courier from oncology as scheduled  Increased hydromorphone to 4 mg as needed tid  Added bowel regimen   I spoke with patient's wife at the bedside, we talked in detail about patient's condition, plan of care and prognosis and all questions were addressed.   Home Health: no   Equipment/Devices: na    Discharge Condition: stable CODE STATUS: full  Diet recommendation:  regular as tolerated.   Brief/Interim Summary: [name] was admitted to the hospital with the working diagnosis of severe sepsis, due to left neck soft tissue infection, in the setting of severe neutropenia and neck soft tissue lymphoma.    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  He started radiation therapy on 10/30/2020, following chemotherapy, which was stopped 10/11/2020 due to progressive disease. Because of severe neck pain and fever he presented to the hospital for further evaluation.  His blood pressure was 153/59, heart rate 91, respiratory rate 18, oxygen saturation 96%, his lungs were clear to auscultation bilaterally, heart S1-S2, present, rhythmic, soft abdomen, L extremity edema, patient had a large 5 cm hardened mass with multiple areas of skin necrosis and surrounding erythema and tender to palpation.   Sodium 137, potassium 3.7, chloride 100, bicarb 27, glucose 131, BUN 12, creatinine 0.70, white count 1.0, hemoglobin 11.6, hematocrit 32.9, platelets 181. SARS COVID-19 negative.  CT soft tissue neck with 7 cm trans spatial right neck mass with marked size  progression from June 2020 and extensive necrosis with evidence of draining to the skin.  The right facial artery transverses the necrotic mass.  2 new groundglass nodules in the right upper lobe which could be a lymphomatous.  Patient was placed on broad-spectrum antibiotic therapy vancomycin and cefepime. Develop neutropenia, that improving with Granix.   Oncology consultation for further work-up with bone marrow biopsy  Resumed radiation therapy to neck mass with good toleration.    Continue to have severe pain, requiring IV opiate analgesics.  Poor oral intake due to neck mass.   Antibiotic therapy was changed to oral Augmentin and completed course while in the hospital.  Hydromorphone has been increased to 4 mg as needed with good toleration.   Patient will follow up with Oncology as outpatient.   Severe sepsis due to neck cellulitis in the setting of diffuse follicle center lymphoma, complicated by neutropenia and anemia. Patient received radiation therapy to his neck mass, he completed full course of antibiotic during his hospitalization. No purulent drainage or deep abscess noted.   Plan to follow-up as an outpatient.  His neck pain was difficult to control, requiring intravenous opiate analgesics. He was placed on gabapentin 600 mg 3 times daily and oral hydromorphone.  Over the last 24 hours he has not required any intravenous analgesic agent.  He has been using 4 mg of hydromorphone about 3 times daily.  Patient had a bone marrow biopsy 10/04 which reports still pending. It is suspected that the lesion is now radioresistant and further prognosis will depend on response to systemic chemotherapy.  Continue with steroid taper.   2.  Seizures.  Continue phenytoin.  3.  Occipital neuralgia.  Continue with gabapentin.  5.  Hypertension/dyslipidemia.  Continue blood pressure control with amlodipine and metoprolol. Continue with statin therapy.    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)   HTN (hypertension)   Bilateral occipital neuralgia   Severe neutropenia (HCC)   Seizure disorder (HCC)   Normocytic anemia   Lung nodule    Discharge Instructions   Allergies as of 12/19/2020       Reactions   Brimonidine Tartrate-timolol Other (See Comments)   Other reaction(s): Swelling in eyes   Lisinopril Hives, Other (See Comments)   Other reaction(s): swelling of lips (undetermined)   Netarsudil Dimesylate Other (See Comments)   Other reaction(s): Swelliing in eyes        Medication List     STOP taking these medications    cyclobenzaprine 5 MG tablet Commonly known as: FLEXERIL   lidocaine 2 % solution Commonly known as: XYLOCAINE   oxyCODONE 5 MG immediate release tablet Commonly known as: Oxy IR/ROXICODONE   senna-docusate 8.6-50 MG tablet Commonly known as: Senokot-S       TAKE these medications    acetaminophen 325 MG tablet Commonly known as: TYLENOL Take 2 tablets (650 mg total) by mouth every 4 (four) hours as needed for mild pain, moderate pain, fever or headache (or Fever >/= 101).   acyclovir 400 MG tablet Commonly known as: ZOVIRAX TAKE 1 TABLET BY MOUTH TWICE A DAY   allopurinol 300 MG tablet Commonly known as: ZYLOPRIM TAKE 1 TABLET BY MOUTH EVERY DAY   amLODipine 5 MG tablet Commonly known as: NORVASC Take 5 mg by mouth Daily.   aspirin 81 MG tablet Take 81 mg by mouth daily.   CALCIUM 600 + D PO Take 1 tablet by mouth daily.   cholecalciferol 1000 units tablet Commonly known as: VITAMIN D Take 1,000 Units by mouth daily.   dexamethasone 2 MG tablet Commonly known as: DECADRON Take 2 tablets daily for 2 days, then continue with 1 tablet daily for 7 days. What changed:  medication strength additional instructions   feeding supplement Liqd Take 237 mLs by mouth with breakfast, with lunch, and with evening meal.   gabapentin 300 MG  capsule Commonly known as: NEURONTIN Take 2 capsules (600 mg total) by mouth 3 (three) times daily. Take 1 capsule (300 mg total) by mouth twice a day and 2 capsules (660m) at bedtime What changed: additional instructions   HYDROmorphone 4 MG tablet Commonly known as: DILAUDID Take 1 tablet (4 mg total) by mouth every 8 (eight) hours as needed for severe pain. What changed:  medication strength how much to take when to take this   lenalidomide 25 MG capsule Commonly known as: REVLIMID Take 1 capsule (25 mg total) by mouth daily. Take for 21 days, then none for 7 days. Repeat as ordered. [employer] Auth # [id number] Date Obtained 12/18/20   lidocaine-prilocaine cream Commonly known as: EMLA Apply 1 application topically as needed. What changed:  when to take this reasons to take this   metoprolol succinate 25 MG 24 hr tablet Commonly known as: TOPROL-XL Take 25 mg by mouth Daily.   multivitamin with minerals Tabs tablet Take 1 tablet by mouth daily. Start taking on: December 20, 2020   phenytoin 100 MG ER capsule Commonly known as: DILANTIN Take 200 mg by mouth 2 (two) times daily.   polyethylene glycol 17 g packet  Commonly known as: MIRALAX / GLYCOLAX Take 17 g by mouth daily.   protein supplement 6 g Powd Commonly known as: RESOURCE BENEPROTEIN Take 1 Scoop (6 g total) by mouth 3 (three) times daily with meals.   rosuvastatin 20 MG tablet Commonly known as: CRESTOR Take 20 mg by mouth daily.   timolol 0.5 % ophthalmic solution Commonly known as: BETIMOL Place 1 drop into both eyes 2 (two) times daily.   Vyzulta 0.024 % Soln Generic drug: Latanoprostene Bunod Place 1 drop into both eyes at bedtime.        Allergies  Allergen Reactions   Brimonidine Tartrate-Timolol Other (See Comments)    Other reaction(s): Swelling in eyes   Lisinopril Hives and Other (See Comments)    Other reaction(s): swelling of lips (undetermined)   Netarsudil Dimesylate Other (See  Comments)    Other reaction(s): Swelliing in eyes    Consultations: Radiation oncology  Oncology  Ir   Procedures/Studies: DG Chest 2 View  Result Date: 12/10/2020 CLINICAL DATA:  Cough and fever EXAM: CHEST - 2 VIEW COMPARISON:  06/25/2020 FINDINGS: Coarsened lung markings over the lower lobes on the lateral view with scar-like appearance by prior chest CT February 2022. There is no edema, consolidation, effusion, or pneumothorax. Normal heart size and mediastinal contours. Unremarkable porta catheter positioning IMPRESSION: No evidence of active disease. Electronically Signed   By: Jorje Guild M.D.   On: 12/10/2020 05:18   DG Wrist Complete Left  Result Date: 12/05/2020 CLINICAL DATA:  Fall. EXAM: LEFT HAND - COMPLETE 3+ VIEW; LEFT WRIST - COMPLETE 3+ VIEW COMPARISON:  None. FINDINGS: No acute fracture or dislocation. Prior trapeziiectomy. Mild osteoarthritis of the DIP joints and thumb IP joint. Bone mineralization is normal. Soft tissues are unremarkable. IMPRESSION: 1. No acute osseous abnormality. Electronically Signed   By: Titus Dubin M.D.   On: 12/05/2020 18:19   CT Head Wo Contrast  Result Date: 12/05/2020 CLINICAL DATA:  Recent fall with left facial trauma and pain, initial encounter EXAM: CT HEAD WITHOUT CONTRAST CT MAXILLOFACIAL WITHOUT CONTRAST CT CERVICAL SPINE WITHOUT CONTRAST TECHNIQUE: Multidetector CT imaging of the head, cervical spine, and maxillofacial structures were performed using the standard protocol without intravenous contrast. Multiplanar CT image reconstructions of the cervical spine and maxillofacial structures were also generated. COMPARISON:  09/11/2020 FINDINGS: CT HEAD FINDINGS Brain: No evidence of acute infarction, hemorrhage, hydrocephalus, extra-axial collection or mass lesion/mass effect. Mild atrophic changes are noted. Vascular: No hyperdense vessel or unexpected calcification. Skull: Normal. Negative for fracture or focal lesion. Other: None.  CT MAXILLOFACIAL FINDINGS Osseous: Mildly downward displaced distal nasal bone fracture is noted best seen on image number 48 of series 9. This is noted centrally at the bridge of the nose. No other fractures are seen. No other bony abnormality is noted. Orbits: The orbits and their contents are within normal limits. No muscular abnormality is noted. Sinuses: Paranasal sinuses are well aerated. No mucosal abnormality is seen. Soft tissues: Surrounding soft tissue structures again show an enlarged soft tissue mass lesion similar to that seen on prior CT of the neck. It demonstrates changes of central necrosis as well as some air within which may be related to recent biopsy. Clinical correlation is recommended. This lesion now measures 6.3 x 4.8 cm which is increased from the prior exam at which time it measured 4.9 x 3.3 cm. Some surrounding soft tissue edema is noted. Some extension along the masseter muscle on the right is noted she was not appreciated  on the prior exam. To soft tissue foci are seen. One of these measures approximately 15 mm and the second approximally 18 mm. Mild soft tissue swelling in the forehead is noted. CT CERVICAL SPINE FINDINGS Alignment: Within normal limits. Skull base and vertebrae: Cervical segments are well visualized. Vertebral body height is well maintained. Disc space narrowing is noted from C4-C7 with mild associated osteophytic changes. Multilevel facet hypertrophic changes are seen. No acute fracture or acute facet abnormality is noted. Neural foraminal narrowing is noted bilaterally. Soft tissues and spinal canal: Surrounding soft tissue structures again show a mass lesion in the right lateral neck increased in size from the prior study in June of 2022 with some central necrosis and air identified. Upper chest: Visualized lung apices are within normal limits. Other: None IMPRESSION: CT of the head: Mild atrophic changes without acute intracranial abnormality. CT of the  maxillofacial bones: Acute nasal bone fracture involving the bridge distally. No other bony abnormality is seen. Soft tissue mass lesion in the right lateral neck similar to this seen on prior CT examination from June of 2022 although increased in size as described. Some additional soft tissue components extending along the masseter muscle on the right are noted new from the prior exam. This is consistent with the given clinical history of lymphoma. Mild left forehead hematoma is seen. CT of the cervical spine: Multilevel degenerative changes without acute abnormality. Electronically Signed   By: Inez Catalina M.D.   On: 12/05/2020 17:23   CT Soft Tissue Neck W Contrast  Result Date: 12/10/2020 CLINICAL DATA:  Lymphoma under treatment. Malaise and fever. Ulcerated right lateral neck, site of radiotherapy EXAM: CT NECK WITH CONTRAST TECHNIQUE: Multidetector CT imaging of the neck was performed using the standard protocol following the bolus administration of intravenous contrast. CONTRAST:  3m OMNIPAQUE IOHEXOL 350 MG/ML SOLN COMPARISON:  09/11/2020 FINDINGS: Pharynx and larynx: No evidence of mass or thickening of Waldeyer's ring. Palatine tonsilliths Salivary glands: Right submandibular mass involving the parotid tail and submandibular glands, progressed in size, and necrotic with central low-density, gas, and opening to the skin surface. The mass measures up to 7 cm in span. Increased solid tumor invasion into the right parotid and right anterior masseter. The mass is closely associated with the right facial artery, without visible pseudoaneurysm. Thyroid: Normal Lymph nodes: It is unclear if the right neck mass was initially a node. No other enlarged or heterogeneous nodes. Vascular: As above. Atheromatous calcifications and right IJ porta catheter. 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 Visualized orbits: Negative Mastoids and visualized paranasal sinuses: Clear  Skeleton: Patient's mass is in continuity with the right mandibular body without asymmetric bone loss or erosion. Upper chest: 2 new ground-glass nodules in the right upper lobe measuring 1 cm. IMPRESSION: 1. ~7 cm trans spatial right neck mass with marked size progression from June 2020 and extensive necrosis with evidence of draining to the skin. The right facial artery traverses the necrotic mass. 2. Two new ground-glass nodules in the right upper lobe which could be lymphomatous. Electronically Signed   By: JJorje GuildM.D.   On: 12/10/2020 07:39   CT Cervical Spine Wo Contrast  Result Date: 12/05/2020 CLINICAL DATA:  Recent fall with left facial trauma and pain, initial encounter EXAM: CT HEAD WITHOUT CONTRAST CT MAXILLOFACIAL WITHOUT CONTRAST CT CERVICAL SPINE WITHOUT CONTRAST TECHNIQUE: Multidetector CT imaging of the head, cervical spine, and maxillofacial structures were performed using the standard protocol without intravenous  contrast. Multiplanar CT image reconstructions of the cervical spine and maxillofacial structures were also generated. COMPARISON:  09/11/2020 FINDINGS: CT HEAD FINDINGS Brain: No evidence of acute infarction, hemorrhage, hydrocephalus, extra-axial collection or mass lesion/mass effect. Mild atrophic changes are noted. Vascular: No hyperdense vessel or unexpected calcification. Skull: Normal. Negative for fracture or focal lesion. Other: None. CT MAXILLOFACIAL FINDINGS Osseous: Mildly downward displaced distal nasal bone fracture is noted best seen on image number 48 of series 9. This is noted centrally at the bridge of the nose. No other fractures are seen. No other bony abnormality is noted. Orbits: The orbits and their contents are within normal limits. No muscular abnormality is noted. Sinuses: Paranasal sinuses are well aerated. No mucosal abnormality is seen. Soft tissues: Surrounding soft tissue structures again show an enlarged soft tissue mass lesion similar to that  seen on prior CT of the neck. It demonstrates changes of central necrosis as well as some air within which may be related to recent biopsy. Clinical correlation is recommended. This lesion now measures 6.3 x 4.8 cm which is increased from the prior exam at which time it measured 4.9 x 3.3 cm. Some surrounding soft tissue edema is noted. Some extension along the masseter muscle on the right is noted she was not appreciated on the prior exam. To soft tissue foci are seen. One of these measures approximately 15 mm and the second approximally 18 mm. Mild soft tissue swelling in the forehead is noted. CT CERVICAL SPINE FINDINGS Alignment: Within normal limits. Skull base and vertebrae: Cervical segments are well visualized. Vertebral body height is well maintained. Disc space narrowing is noted from C4-C7 with mild associated osteophytic changes. Multilevel facet hypertrophic changes are seen. No acute fracture or acute facet abnormality is noted. Neural foraminal narrowing is noted bilaterally. Soft tissues and spinal canal: Surrounding soft tissue structures again show a mass lesion in the right lateral neck increased in size from the prior study in June of 2022 with some central necrosis and air identified. Upper chest: Visualized lung apices are within normal limits. Other: None IMPRESSION: CT of the head: Mild atrophic changes without acute intracranial abnormality. CT of the maxillofacial bones: Acute nasal bone fracture involving the bridge distally. No other bony abnormality is seen. Soft tissue mass lesion in the right lateral neck similar to this seen on prior CT examination from June of 2022 although increased in size as described. Some additional soft tissue components extending along the masseter muscle on the right are noted new from the prior exam. This is consistent with the given clinical history of lymphoma. Mild left forehead hematoma is seen. CT of the cervical spine: Multilevel degenerative changes  without acute abnormality. Electronically Signed   By: Inez Catalina M.D.   On: 12/05/2020 17:23   CT BIOPSY  Result Date: 12/17/2020 INDICATION: 80 year old male with history of lymphoma neutropenia. EXAM: CT-GUIDED BONE MARROW BIOPSY AND ASPIRATION MEDICATIONS: None ANESTHESIA/SEDATION: Fentanyl 100 mcg IV; Versed 2 mg IV Sedation Time: 10 minutes; The patient was continuously monitored during the procedure by the interventional radiology nurse under my direct supervision. COMPLICATIONS: None immediate. PROCEDURE: Informed consent was obtained from the patient following an explanation of the procedure, risks, benefits and alternatives. The patient understands, agrees and consents for the procedure. All questions were addressed. A time out was performed prior to the initiation of the procedure. The patient was positioned prone and non-contrast localization CT was performed of the pelvis to demonstrate the iliac marrow spaces. The operative  site was prepped and draped in the usual sterile fashion. Under sterile conditions and local anesthesia, a 22 gauge spinal needle was utilized for procedural planning. Next, an 11 gauge coaxial bone biopsy needle was advanced into the right iliac marrow space. Needle position was confirmed with CT imaging. Initially, a bone marrow aspiration was performed. Next, a bone marrow biopsy was obtained with the 11 gauge outer bone marrow device. Samples were prepared with the cytotechnologist and deemed adequate. The needle was removed and superficial hemostasis was obtained with manual compression. A dressing was applied. The patient tolerated the procedure well without immediate post procedural complication. IMPRESSION: Successful CT guided right iliac bone marrow aspiration and core biopsy. Ruthann Cancer, MD [facility] Radiology Electronically Signed   By: Ruthann Cancer M.D.   On: 12/17/2020 12:46   DG Knee Complete 4 Views  Left  Result Date: 12/05/2020 CLINICAL DATA:  Fall. EXAM: LEFT KNEE - COMPLETE 4+ VIEW COMPARISON:  None. FINDINGS: No acute fracture or dislocation. No joint effusion. Mild medial compartment joint space narrowing. Bone mineralization is normal. Soft tissues are unremarkable. IMPRESSION: 1. No acute osseous abnormality. 2. Mild medial compartment osteoarthritis. Electronically Signed   By: Titus Dubin M.D.   On: 12/05/2020 18:16   DG Hand Complete Left  Result Date: 12/05/2020 CLINICAL DATA:  Fall. EXAM: LEFT HAND - COMPLETE 3+ VIEW; LEFT WRIST - COMPLETE 3+ VIEW COMPARISON:  None. FINDINGS: No acute fracture or dislocation. Prior trapeziiectomy. Mild osteoarthritis of the DIP joints and thumb IP joint. Bone mineralization is normal. Soft tissues are unremarkable. IMPRESSION: 1. No acute osseous abnormality. Electronically Signed   By: Titus Dubin M.D.   On: 12/05/2020 18:19   CT BONE MARROW BIOPSY & ASPIRATION  Result Date: 12/17/2020 INDICATION: 80 year old male with history of lymphoma neutropenia. EXAM: CT-GUIDED BONE MARROW BIOPSY AND ASPIRATION MEDICATIONS: None ANESTHESIA/SEDATION: Fentanyl 100 mcg IV; Versed 2 mg IV Sedation Time: 10 minutes; The patient was continuously monitored during the procedure by the interventional radiology nurse under my direct supervision. COMPLICATIONS: None immediate. PROCEDURE: Informed consent was obtained from the patient following an explanation of the procedure, risks, benefits and alternatives. The patient understands, agrees and consents for the procedure. All questions were addressed. A time out was performed prior to the initiation of the procedure. The patient was positioned prone and non-contrast localization CT was performed of the pelvis to demonstrate the iliac marrow spaces. The operative site was prepped and draped in the usual sterile fashion. Under sterile conditions and local anesthesia, a 22 gauge spinal needle was utilized for procedural  planning. Next, an 11 gauge coaxial bone biopsy needle was advanced into the right iliac marrow space. Needle position was confirmed with CT imaging. Initially, a bone marrow aspiration was performed. Next, a bone marrow biopsy was obtained with the 11 gauge outer bone marrow device. Samples were prepared with the cytotechnologist and deemed adequate. The needle was removed and superficial hemostasis was obtained with manual compression. A dressing was applied. The patient tolerated the procedure well without immediate post procedural complication. IMPRESSION: Successful CT guided right iliac bone marrow aspiration and core biopsy. Ruthann Cancer, MD [facility] Radiology Electronically Signed   By: Ruthann Cancer M.D.   On: 12/17/2020 12:46   [place] CORE BIOPSY (SALIVARY GLAND/PAROTID GLAND)  Result Date: 12/06/2020 INDICATION: 80 year old with a large right neck mass and follicular lymphoma. Patient has new lesions on the right side of the face. EXAM:  Ultrasound-guided core biopsy of right parotid gland lesion MEDICATIONS: Moderate sedation ANESTHESIA/SEDATION: Moderate (conscious) sedation was employed during this procedure. A total of Versed 1.0 mg and Fentanyl 50 mcg was administered intravenously. Moderate Sedation Time: 15 minutes. The patient's level of consciousness and vital signs were monitored continuously by radiology nursing throughout the procedure under my direct supervision. FLUOROSCOPY TIME:  None COMPLICATIONS: None immediate. PROCEDURE: Informed written consent was obtained from the patient after a thorough discussion of the procedural risks, benefits and alternatives. All questions were addressed. A timeout was performed prior to the initiation of the procedure. Patient has visible and palpable lesions on the right side of the face above the right neck mass. This area was evaluated with ultrasound. Two nodules in this area correspond with the recent  CT imaging. Right side of the face was prepped with chlorhexidine and sterile field was created. Skin and soft tissues were anesthetized using 1% lidocaine. A tiny incision was made on the right side of the neck. Using ultrasound guidance, a total of 5 core biopsies were obtained with an 18 gauge [employer] [place] device in the right parotid lesion. Specimens placed in saline. Bandage placed over the puncture site. FINDINGS: Large mass in the right upper neck. There are 2 visible nodules on the right side of the face anterior to the right ear. These correspond with nodules on the recent CT imaging. There is 1 nodule within the anterior right parotid gland that measures 1.5 x 0.9 x 1.5 cm. This nodule was biopsied. There is a larger nodule anterior to the right parotid nodule that is more hypoechoic and heterogeneous. This nodule is involving the right masseter muscle based on the CT and is contiguous with the larger right neck mass based on sonography. Morphology of these new right facial nodules are similar to the larger right neck mass. IMPRESSION: Ultrasound-guided core biopsies of the right parotid lesion. Electronically Signed   By: Markus Daft M.D.   On: 12/06/2020 15:35   CT Maxillofacial WO CM  Result Date: 12/05/2020 CLINICAL DATA:  Recent fall with left facial trauma and pain, initial encounter EXAM: CT HEAD WITHOUT CONTRAST CT MAXILLOFACIAL WITHOUT CONTRAST CT CERVICAL SPINE WITHOUT CONTRAST TECHNIQUE: Multidetector CT imaging of the head, cervical spine, and maxillofacial structures were performed using the standard protocol without intravenous contrast. Multiplanar CT image reconstructions of the cervical spine and maxillofacial structures were also generated. COMPARISON:  09/11/2020 FINDINGS: CT HEAD FINDINGS Brain: No evidence of acute infarction, hemorrhage, hydrocephalus, extra-axial collection or mass lesion/mass effect. Mild atrophic changes are noted. Vascular: No hyperdense vessel or unexpected  calcification. Skull: Normal. Negative for fracture or focal lesion. Other: None. CT MAXILLOFACIAL FINDINGS Osseous: Mildly downward displaced distal nasal bone fracture is noted best seen on image number 48 of series 9. This is noted centrally at the bridge of the nose. No other fractures are seen. No other bony abnormality is noted. Orbits: The orbits and their contents are within normal limits. No muscular abnormality is noted. Sinuses: Paranasal sinuses are well aerated. No mucosal abnormality is seen. Soft tissues: Surrounding soft tissue structures again show an enlarged soft tissue mass lesion similar to that seen on prior CT of the neck. It demonstrates changes of central necrosis as well as some air within which may be related to recent biopsy. Clinical correlation is recommended. This lesion now measures 6.3 x 4.8 cm which is increased from the prior exam at which time it measured 4.9 x 3.3 cm. Some surrounding soft tissue  edema is noted. Some extension along the masseter muscle on the right is noted she was not appreciated on the prior exam. To soft tissue foci are seen. One of these measures approximately 15 mm and the second approximally 18 mm. Mild soft tissue swelling in the forehead is noted. CT CERVICAL SPINE FINDINGS Alignment: Within normal limits. Skull base and vertebrae: Cervical segments are well visualized. Vertebral body height is well maintained. Disc space narrowing is noted from C4-C7 with mild associated osteophytic changes. Multilevel facet hypertrophic changes are seen. No acute fracture or acute facet abnormality is noted. Neural foraminal narrowing is noted bilaterally. Soft tissues and spinal canal: Surrounding soft tissue structures again show a mass lesion in the right lateral neck increased in size from the prior study in June of 2022 with some central necrosis and air identified. Upper chest: Visualized lung apices are within normal limits. Other: None IMPRESSION: CT of the  head: Mild atrophic changes without acute intracranial abnormality. CT of the maxillofacial bones: Acute nasal bone fracture involving the bridge distally. No other bony abnormality is seen. Soft tissue mass lesion in the right lateral neck similar to this seen on prior CT examination from June of 2022 although increased in size as described. Some additional soft tissue components extending along the masseter muscle on the right are noted new from the prior exam. This is consistent with the given clinical history of lymphoma. Mild left forehead hematoma is seen. CT of the cervical spine: Multilevel degenerative changes without acute abnormality. Electronically Signed   By: Inez Catalina M.D.   On: 12/05/2020 17:23     Procedures: bone marrow biopsy   Subjective: Patient is feeling better, his neck pain is controlled, no nausea or vomiting.   Discharge Exam: Vitals:   12/18/20 2112 12/19/20 0508  BP: 140/61 135/64  Pulse: 79 77  Resp: 16 14  Temp: 98.8 F (37.1 C) 98.1 F (36.7 C)  SpO2: 97% 93%   Vitals:   12/18/20 0534 12/18/20 1412 12/18/20 2112 12/19/20 0508  BP: 137/72 124/70 140/61 135/64  Pulse: 74 72 79 77  Resp: _0 Temp: 97.8 F (36.6 C) (!) 97.5 F (36.4 C) 98.8 F (37.1 C) 98.1 F (36.7 C)  TempSrc: Oral Oral Oral Oral  SpO2: 94% 96% 97% 93%  Weight:      Height:        General: Not in pain or dyspnea, deconditioned  Neurology: Awake and alert, non focal  E ENT: no pallor, no icterus, oral mucosa moist/ large ulcerated mass on his left neck and jaw region.,  Cardiovascular: No JVD. S1-S2 present, rhythmic, no gallops, rubs, or murmurs. No lower extremity edema. Pulmonary: positive breath sounds bilaterally, adequate air movement, no wheezing, rhonchi or rales. Gastrointestinal. Abdomen soft and non tender Skin. Neck and jaw ulcerated mass.  Musculoskeletal: no joint deformities      The results of significant diagnostics from this hospitalization  (including imaging, microbiology, ancillary and laboratory) are listed below for reference.     Microbiology: Recent Results (from the past 240 hour(s))  Culture, blood (x 2)     Status: None   Collection Time: 12/10/20  4:40 AM   Specimen: BLOOD RIGHT WRIST  Result Value Ref Range Status   Specimen Description BLOOD RIGHT WRIST  Final   Special Requests   Final    BOTTLES DRAWN AEROBIC AND ANAEROBIC Blood Culture adequate volume   Culture   Final    NO GROWTH 5  DAYS Performed at [facility], [place] [address] [place], [place] [address]    Report Status 12/15/2020 FINAL  Final  Culture, blood (x 2)     Status: None   Collection Time: 12/10/20  4:51 AM   Specimen: BLOOD LEFT WRIST  Result Value Ref Range Status   Specimen Description BLOOD LEFT WRIST  Final   Special Requests   Final    BOTTLES DRAWN AEROBIC AND ANAEROBIC Blood Culture adequate volume   Culture   Final    NO GROWTH 5 DAYS Performed at [facility], [place] [address] [place], [place] [address]    Report Status 12/15/2020 FINAL  Final  Resp Panel by RT-PCR (Flu A&B, Covid) Nasopharyngeal Swab     Status: None   Collection Time: 12/10/20  5:50 AM   Specimen: Nasopharyngeal Swab; Nasopharyngeal(NP) swabs in vial transport medium  Result Value Ref Range Status   SARS Coronavirus 2 by RT PCR NEGATIVE NEGATIVE Final    Comment: (NOTE) SARS-CoV-2 target nucleic acids are NOT DETECTED.  The SARS-CoV-2 RNA is generally detectable in upper respiratory specimens during the acute phase of infection. The lowest concentration of SARS-CoV-2 viral copies this assay can detect is 138 copies/mL. A negative result does not preclude SARS-Cov-2 infection and should not be used as the sole basis for treatment or other patient management decisions. A negative result may occur with  improper specimen collection/handling, submission of specimen other than nasopharyngeal swab, presence of viral mutation(s) within the areas  targeted by this assay, and inadequate number of viral copies(<138 copies/mL). A negative result must be combined with clinical observations, patient history, and epidemiological information. The expected result is Negative.  Fact Sheet for Patients:  EntrepreneurPulse.com.au  Fact Sheet for Healthcare Providers:  IncredibleEmployment.be  This test is no t yet approved or cleared by the [place] FDA and  has been authorized for detection and/or diagnosis of SARS-CoV-2 by FDA under an Emergency Use Authorization (EUA). This EUA will remain  in effect (meaning this test can be used) for the duration of the COVID-19 declaration under Section 564(b)(1) of the Act, 21 U.S.C.section 360bbb-3(b)(1), unless the authorization is terminated  or revoked sooner.       Influenza A by PCR NEGATIVE NEGATIVE Final   Influenza B by PCR NEGATIVE NEGATIVE Final    Comment: (NOTE) The Xpert Xpress SARS-CoV-2/FLU/RSV plus assay is intended as an aid in the diagnosis of influenza from Nasopharyngeal swab specimens and should not be used as a sole basis for treatment. Nasal washings and aspirates are unacceptable for Xpert Xpress SARS-CoV-2/FLU/RSV testing.  Fact Sheet for Patients: EntrepreneurPulse.com.au  Fact Sheet for Healthcare Providers: IncredibleEmployment.be  This test is not yet approved or cleared by the [place] FDA and has been authorized for detection and/or diagnosis of SARS-CoV-2 by FDA under an Emergency Use Authorization (EUA). This EUA will remain in effect (meaning this test can be used) for the duration of the COVID-19 declaration under Section 564(b)(1) of the Act, 21 U.S.C. section 360bbb-3(b)(1), unless the authorization is terminated or revoked.  Performed at [facility], [address], [place], [place] [address]   MRSA Next Gen by PCR, Nasal     Status: None    Collection Time: 12/12/20 10:31 AM   Specimen: Nasal Mucosa; Nasal Swab  Result Value Ref Range Status   MRSA by PCR Next Gen NOT DETECTED NOT DETECTED Final    Comment: (NOTE) The GeneXpert MRSA Assay (FDA approved for  NASAL specimens only), is one component of a comprehensive MRSA colonization surveillance program. It is not intended to diagnose MRSA infection nor to guide or monitor treatment for MRSA infections. Test performance is not FDA approved in patients less than 96 years old. Performed at [facility], [place] [address], [place], [place] [address]      Labs: BNP (last 3 results) No results for input(s): BNP in the last 8760 hours. Basic Metabolic Panel: Recent Labs  Lab 12/13/20 0545 12/14/20 0534 12/15/20 0607 12/16/20 0528 12/17/20 0522  NA 134* 138 141 141 139  K 3.8 4.4 4.1 4.0 3.8  CL 101 105 104 102 103  CO2 _0 GLUCOSE 132* 129* 110* 116* 129*  BUN _1 CREATININE 0.71 0.55* 0.65 0.61 0.62  CALCIUM 8.9 8.9 9.1 9.0 8.8*  MG 2.0 2.3 2.1 2.0 2.3   Liver Function Tests: Recent Labs  Lab 12/13/20 0545 12/14/20 0534 12/15/20 0607 12/16/20 0528 12/17/20 0522  AST _2 ALT _3 ALKPHOS 70 71 84 102 101  BILITOT 0.3 0.3 0.2* 0.2* 0.2*  PROT 6.7 6.5 6.5 6.4* 6.4*  ALBUMIN 3.1* 3.1* 3.1* 2.9* 3.1*   No results for input(s): LIPASE, AMYLASE in the last 168 hours. No results for input(s): AMMONIA in the last 168 hours. CBC: Recent Labs  Lab 12/13/20 0545 12/14/20 0534 12/15/20 0607 12/16/20 0528 12/17/20 0522  WBC 1.3* 2.5* 9.3 16.8* 11.0*  NEUTROABS 0.1* 0.8* 5.7 12.1* 8.2*  HGB 10.8* 10.9* 11.5* 11.6* 11.4*  HCT 31.3* 32.4* 33.8* 33.3* 33.9*  MCV 95.4 95.6 95.5 95.4 95.8  PLT 183 181 171 200 162   Cardiac Enzymes: No results for input(s): CKTOTAL, CKMB, CKMBINDEX, TROPONINI in the last 168 hours. BNP: Invalid input(s): POCBNP CBG: No results for input(s): GLUCAP in the last  168 hours. D-Dimer No results for input(s): DDIMER in the last 72 hours. Hgb A1c No results for input(s): HGBA1C in the last 72 hours. Lipid Profile No results for input(s): CHOL, HDL, LDLCALC, TRIG, CHOLHDL, LDLDIRECT in the last 72 hours. Thyroid function studies No results for input(s): TSH, T4TOTAL, T3FREE, THYROIDAB in the last 72 hours.  Invalid input(s): FREET3 Anemia work up No results for input(s): VITAMINB12, FOLATE, FERRITIN, TIBC, IRON, RETICCTPCT in the last 72 hours. Urinalysis    Component Value Date/Time   COLORURINE YELLOW 06/25/2020 1950   APPEARANCEUR CLEAR 06/25/2020 1950   LABSPEC 1.018 06/25/2020 1950   PHURINE 5.5 06/25/2020 1950   GLUCOSEU NEGATIVE 06/25/2020 1950   HGBUR LARGE (A) 06/25/2020 1950   BILIRUBINUR NEGATIVE 06/25/2020 1950   KETONESUR NEGATIVE 06/25/2020 1950   PROTEINUR TRACE (A) 06/25/2020 1950   UROBILINOGEN 0.2 03/06/2016 1251   NITRITE NEGATIVE 06/25/2020 1950   LEUKOCYTESUR NEGATIVE 06/25/2020 1950   Sepsis Labs Invalid input(s): PROCALCITONIN,  WBC,  LACTICIDVEN Microbiology Recent Results (from the past 240 hour(s))  Culture, blood (x 2)     Status: None   Collection Time: 12/10/20  4:40 AM   Specimen: BLOOD RIGHT WRIST  Result Value Ref Range Status   Specimen Description BLOOD RIGHT WRIST  Final   Special Requests   Final    BOTTLES DRAWN AEROBIC AND ANAEROBIC Blood Culture adequate volume   Culture   Final    NO GROWTH 5 DAYS Performed at [facility], [address] [place], [place] [address]    Report Status 12/15/2020 FINAL  Final  Culture, blood (x 2)     Status: None   Collection Time: 12/10/20  4:51 AM   Specimen: BLOOD LEFT WRIST  Result Value Ref Range Status   Specimen Description BLOOD LEFT WRIST  Final   Special Requests   Final    BOTTLES DRAWN AEROBIC AND ANAEROBIC Blood Culture adequate volume   Culture   Final    NO GROWTH 5 DAYS Performed at [facility], [address]  [place], [place] [address]    Report Status 12/15/2020 FINAL  Final  Resp Panel by RT-PCR (Flu A&B, Covid) Nasopharyngeal Swab     Status: None   Collection Time: 12/10/20  5:50 AM   Specimen: Nasopharyngeal Swab; Nasopharyngeal(NP) swabs in vial transport medium  Result Value Ref Range Status   SARS Coronavirus 2 by RT PCR NEGATIVE NEGATIVE Final    Comment: (NOTE) SARS-CoV-2 target nucleic acids are NOT DETECTED.  The SARS-CoV-2 RNA is generally detectable in upper respiratory specimens during the acute phase of infection. The lowest concentration of SARS-CoV-2 viral copies this assay can detect is 138 copies/mL. A negative result does not preclude SARS-Cov-2 infection and should not be used as the sole basis for treatment or other patient management decisions. A negative result may occur with  improper specimen collection/handling, submission of specimen other than nasopharyngeal swab, presence of viral mutation(s) within the areas targeted by this assay, and inadequate number of viral copies(<138 copies/mL). A negative result must be combined with clinical observations, patient history, and epidemiological information. The expected result is Negative.  Fact Sheet for Patients:  EntrepreneurPulse.com.au  Fact Sheet for Healthcare Providers:  IncredibleEmployment.be  This test is no t yet approved or cleared by the [place] FDA and  has been authorized for detection and/or diagnosis of SARS-CoV-2 by FDA under an Emergency Use Authorization (EUA). This EUA will remain  in effect (meaning this test can be used) for the duration of the COVID-19 declaration under Section 564(b)(1) of the Act, 21 U.S.C.section 360bbb-3(b)(1), unless the authorization is terminated  or revoked sooner.       Influenza A by PCR NEGATIVE NEGATIVE Final   Influenza B by PCR NEGATIVE NEGATIVE Final    Comment: (NOTE) The Xpert Xpress SARS-CoV-2/FLU/RSV plus assay is  intended as an aid in the diagnosis of influenza from Nasopharyngeal swab specimens and should not be used as a sole basis for treatment. Nasal washings and aspirates are unacceptable for Xpert Xpress SARS-CoV-2/FLU/RSV testing.  Fact Sheet for Patients: EntrepreneurPulse.com.au  Fact Sheet for Healthcare Providers: IncredibleEmployment.be  This test is not yet approved or cleared by the [place] FDA and has been authorized for detection and/or diagnosis of SARS-CoV-2 by FDA under an Emergency Use Authorization (EUA). This EUA will remain in effect (meaning this test can be used) for the duration of the COVID-19 declaration under Section 564(b)(1) of the Act, 21 U.S.C. section 360bbb-3(b)(1), unless the authorization is terminated or revoked.  Performed at [facility], [name], [place], [place] [address]   MRSA Next Gen by PCR, Nasal     Status: None   Collection Time: 12/12/20 10:31 AM   Specimen: Nasal Mucosa; Nasal Swab  Result Value Ref Range Status   MRSA by PCR Next Gen NOT DETECTED NOT DETECTED Final    Comment: (NOTE) The GeneXpert MRSA Assay (FDA approved for NASAL specimens only), is one component of a comprehensive MRSA colonization surveillance program. It is not intended to diagnose MRSA infection nor  to guide or monitor treatment for MRSA infections. Test performance is not FDA approved in patients less than 52 years old. Performed at [facility], [place] [address], [place], [place] [address]      Time coordinating discharge: 45 minutes  SIGNED:   Tawni Millers, MD  [facility] 12/19/2020, 1:04 PM

## 2020-12-19 NOTE — Telephone Encounter (Signed)
Oral Oncology Patient Advocate Encounter   Received notification from [employer] that prior authorization for Revlimid is required.   PA submitted on CoverMyMeds Key [id number] Status is pending   Oral Oncology Clinic will continue to follow.  [place] Patient [place] Phone [phone] Fax [phone] 12/19/2020 9:51 AM

## 2020-12-20 ENCOUNTER — Telehealth: Payer: Self-pay | Admitting: *Deleted

## 2020-12-20 ENCOUNTER — Ambulatory Visit: Payer: Medicare Other

## 2020-12-20 ENCOUNTER — Other Ambulatory Visit (HOSPITAL_BASED_OUTPATIENT_CLINIC_OR_DEPARTMENT_OTHER): Payer: Self-pay

## 2020-12-20 MED ORDER — FLUAD QUADRIVALENT 0.5 ML IM PRSY
PREFILLED_SYRINGE | INTRAMUSCULAR | 0 refills | Status: DC
  Filled 2020-12-20: qty 0.5, 1d supply, fill #0

## 2020-12-20 NOTE — Telephone Encounter (Signed)
Received  call from pt  this morning. He was discharged from the hospital yesterday. He is asking if it is ok for him to get a flu shot and the newest Covid booster. He also asked about the results of his recent bone marrow biopsy. Discussed with Dr. Lorenso Courier.  Per Dr. Lorenso Courier, it is ok for Brian Castillo to have both vaccinations. His bone marrow biopsy was normal-no signs of lymphoma in his marrow.  Pt voiced understanding. He states he feels pretty tired today.  He states his right neck and face are quite swollen and he feels a lot of pressure from that swelling. He is able to control the pain with Dilaudid 2-4 mg as needed. Advised to call us with any problems with pain control or any other issues. He is aware of his upcoming appts here.

## 2020-12-23 NOTE — Telephone Encounter (Signed)
Oral Oncology Patient Advocate Encounter  Prior Authorization for Revlimid has been approved.    PA# [id number] Effective dates: 12/19/20 through 03/15/22  Oral Oncology Clinic will continue to follow.    [place] Patient [place] Phone [phone] Fax [phone] 12/23/2020 3:48 PM

## 2020-12-23 NOTE — Progress Notes (Signed)
Pharmacist Chemotherapy Monitoring - Initial Assessment    Anticipated start date: 12/30/20   The following has been reviewed per standard work regarding the patient's treatment regimen: The patient's diagnosis, treatment plan and drug doses, and organ/hematologic function Lab orders and baseline tests specific to treatment regimen  The treatment plan start date, drug sequencing, and pre-medications Prior authorization status  Patient's documented medication list, including drug-drug interaction screen and prescriptions for anti-emetics and supportive care specific to the treatment regimen The drug concentrations, fluid compatibility, administration routes, and timing of the medications to be used The patient's access for treatment and lifetime cumulative dose history, if applicable  The patient's medication allergies and previous infusion related reactions, if applicable   Changes made to treatment plan:  N/A  Follow up needed:  N/A   Tora Kindred, RPH, 12/23/2020  12:58 PM

## 2020-12-23 NOTE — Telephone Encounter (Signed)
Oral Oncology Pharmacist Encounter  Notified by [employer] that patient's copay is $2555.30 for first fill of Revlimid. There are currently no grant's open for patient's disease state. Oral Oncology Clinic will proceed with working with patient in an attempt to maintain medication via manufacturer assistance.   Leron Croak, PharmD, BCPS Hematology/Oncology Clinical Pharmacist [facility] [phone] 12/23/2020 10:12 AM

## 2020-12-25 ENCOUNTER — Other Ambulatory Visit: Payer: Self-pay

## 2020-12-25 ENCOUNTER — Telehealth: Payer: Self-pay

## 2020-12-25 ENCOUNTER — Inpatient Hospital Stay: Payer: Medicare Other | Attending: Hematology and Oncology | Admitting: Hematology and Oncology

## 2020-12-25 ENCOUNTER — Inpatient Hospital Stay: Payer: Medicare Other

## 2020-12-25 VITALS — BP 133/66 | HR 67 | Temp 98.4°F | Resp 17 | Wt 166.7 lb

## 2020-12-25 DIAGNOSIS — I10 Essential (primary) hypertension: Secondary | ICD-10-CM | POA: Insufficient documentation

## 2020-12-25 DIAGNOSIS — Z95828 Presence of other vascular implants and grafts: Secondary | ICD-10-CM | POA: Diagnosis not present

## 2020-12-25 DIAGNOSIS — Z5112 Encounter for antineoplastic immunotherapy: Secondary | ICD-10-CM | POA: Insufficient documentation

## 2020-12-25 DIAGNOSIS — Z86010 Personal history of colonic polyps: Secondary | ICD-10-CM | POA: Insufficient documentation

## 2020-12-25 DIAGNOSIS — E785 Hyperlipidemia, unspecified: Secondary | ICD-10-CM | POA: Insufficient documentation

## 2020-12-25 DIAGNOSIS — C8511 Unspecified B-cell lymphoma, lymph nodes of head, face, and neck: Secondary | ICD-10-CM | POA: Insufficient documentation

## 2020-12-25 DIAGNOSIS — Z79899 Other long term (current) drug therapy: Secondary | ICD-10-CM | POA: Diagnosis not present

## 2020-12-25 DIAGNOSIS — M1990 Unspecified osteoarthritis, unspecified site: Secondary | ICD-10-CM | POA: Diagnosis not present

## 2020-12-25 DIAGNOSIS — C8331 Diffuse large B-cell lymphoma, lymph nodes of head, face, and neck: Secondary | ICD-10-CM

## 2020-12-25 DIAGNOSIS — Z923 Personal history of irradiation: Secondary | ICD-10-CM | POA: Insufficient documentation

## 2020-12-25 DIAGNOSIS — K219 Gastro-esophageal reflux disease without esophagitis: Secondary | ICD-10-CM | POA: Insufficient documentation

## 2020-12-25 DIAGNOSIS — G40909 Epilepsy, unspecified, not intractable, without status epilepticus: Secondary | ICD-10-CM | POA: Insufficient documentation

## 2020-12-25 LAB — CBC WITH DIFFERENTIAL (CANCER CENTER ONLY)
Abs Immature Granulocytes: 0.03 10*3/uL (ref 0.00–0.07)
Basophils Absolute: 0 10*3/uL (ref 0.0–0.1)
Basophils Relative: 1 %
Eosinophils Absolute: 0.2 10*3/uL (ref 0.0–0.5)
Eosinophils Relative: 3 %
HCT: 32.1 % — ABNORMAL LOW (ref 39.0–52.0)
Hemoglobin: 10.9 g/dL — ABNORMAL LOW (ref 13.0–17.0)
Immature Granulocytes: 1 %
Lymphocytes Relative: 4 %
Lymphs Abs: 0.3 10*3/uL — ABNORMAL LOW (ref 0.7–4.0)
MCH: 32 pg (ref 26.0–34.0)
MCHC: 34 g/dL (ref 30.0–36.0)
MCV: 94.1 fL (ref 80.0–100.0)
Monocytes Absolute: 0.4 10*3/uL (ref 0.1–1.0)
Monocytes Relative: 6 %
Neutro Abs: 5.6 10*3/uL (ref 1.7–7.7)
Neutrophils Relative %: 85 %
Platelet Count: 265 10*3/uL (ref 150–400)
RBC: 3.41 MIL/uL — ABNORMAL LOW (ref 4.22–5.81)
RDW: 12.7 % (ref 11.5–15.5)
WBC Count: 6.5 10*3/uL (ref 4.0–10.5)
nRBC: 0 % (ref 0.0–0.2)

## 2020-12-25 LAB — CMP (CANCER CENTER ONLY)
ALT: 35 U/L (ref 0–44)
AST: 26 U/L (ref 15–41)
Albumin: 3.4 g/dL — ABNORMAL LOW (ref 3.5–5.0)
Alkaline Phosphatase: 88 U/L (ref 38–126)
Anion gap: 8 (ref 5–15)
BUN: 16 mg/dL (ref 8–23)
CO2: 27 mmol/L (ref 22–32)
Calcium: 9.1 mg/dL (ref 8.9–10.3)
Chloride: 104 mmol/L (ref 98–111)
Creatinine: 0.73 mg/dL (ref 0.61–1.24)
GFR, Estimated: >60 mL/min (ref >60)
Glucose, Bld: 158 mg/dL — ABNORMAL HIGH (ref 70–99)
Potassium: 4.2 mmol/L (ref 3.5–5.1)
Sodium: 139 mmol/L (ref 135–145)
Total Bilirubin: <0.1 mg/dL — ABNORMAL LOW (ref 0.3–1.2)
Total Protein: 7.1 g/dL (ref 6.5–8.1)

## 2020-12-25 LAB — LACTATE DEHYDROGENASE: LDH: 222 U/L — ABNORMAL HIGH (ref 98–192)

## 2020-12-25 MED ORDER — HEPARIN SOD (PORK) LOCK FLUSH 100 UNIT/ML IV SOLN
500.0000 [IU] | Freq: Once | INTRAVENOUS | Status: AC
  Administered 2020-12-25: 500 [IU]

## 2020-12-25 MED ORDER — SODIUM CHLORIDE 0.9% FLUSH
10.0000 mL | Freq: Once | INTRAVENOUS | Status: AC
  Administered 2020-12-25: 10 mL

## 2020-12-25 NOTE — Telephone Encounter (Signed)
Oral Oncology Patient Advocate Encounter  Met patient in lobby to complete application for [place] in an effort to reduce patient's out of pocket expense for Revlimid to $0.    Application completed and faxed to [phone].   [employer] Access Support phone number for follow up is [phone].  This encounter will be updated until final determination.  [place] Patient [place] Phone [phone] Fax [phone] 12/25/2020 2:23 PM

## 2020-12-26 ENCOUNTER — Encounter (HOSPITAL_COMMUNITY): Payer: Self-pay | Admitting: Hematology and Oncology

## 2020-12-26 ENCOUNTER — Other Ambulatory Visit (HOSPITAL_BASED_OUTPATIENT_CLINIC_OR_DEPARTMENT_OTHER): Payer: Self-pay

## 2020-12-26 ENCOUNTER — Ambulatory Visit: Payer: Medicare Other | Attending: Internal Medicine

## 2020-12-26 DIAGNOSIS — Z23 Encounter for immunization: Secondary | ICD-10-CM

## 2020-12-26 MED ORDER — PFIZER COVID-19 VAC BIVALENT 30 MCG/0.3ML IM SUSP
INTRAMUSCULAR | 0 refills | Status: DC
  Filled 2020-12-26: qty 0.3, 1d supply, fill #0

## 2020-12-26 NOTE — Progress Notes (Signed)
   Covid-19 Vaccination Clinic  Name:  ROYALTY DOMAGALA    MRN: [id number] DOB: December 26, 1940  12/26/2020  Mr. Leazer was observed post Covid-19 immunization for 15 minutes without incident. He was provided with Vaccine Information Sheet and instruction to access the V-Safe system.   Mr. Krasinski was instructed to call 911 with any severe reactions post vaccine: Difficulty breathing  Swelling of face and throat  A fast heartbeat  A bad rash all over body  Dizziness and weakness

## 2020-12-27 ENCOUNTER — Telehealth: Payer: Self-pay | Admitting: *Deleted

## 2020-12-27 LAB — PHENYTOIN LEVEL, FREE AND TOTAL
Phenytoin, Free: 0.7 ug/mL — ABNORMAL LOW (ref 1.0–2.0)
Phenytoin, Total: 7.4 ug/mL — ABNORMAL LOW (ref 10.0–20.0)

## 2020-12-27 MED FILL — Dexamethasone Sodium Phosphate Inj 100 MG/10ML: INTRAMUSCULAR | Qty: 2 | Status: AC

## 2020-12-27 NOTE — Telephone Encounter (Signed)
Numerous calls with Brian Castillo today regarding the start of new treatment plan. This required considerable coordination with our IV pharmacy and oral chemotherapy dept. Per both pharmacies- pt should be set to start on 12/30/20 as planned with oral Revlimid and IV Monjuvi. [name] has been working diligently to help move the process along as have the pharmacies.  Dr. Lorenso Courier aware.

## 2020-12-27 NOTE — Telephone Encounter (Signed)
Received call from pt asking about an email he received regarding his new treatment of Monjuvi.  He was apparently click on a link and sign a form but the link seemed to be invalid. Advised that I would discuss with our pharmacy and that they will contact him to assist. Spoke with [place] and Colletta [place] in pharmacy. Colletta [place] will call patient. Asked pt how his pain was the last couple of days. He states he is taking his dilaudid fairly regularly @ every 4-6 hours with fair relief. He states the right side of his face continues to swell and this causes increased pressure and pain. Dr. Lorenso Courier aware of this. Told pt to expect call from our pharmacy

## 2020-12-27 NOTE — Telephone Encounter (Signed)
Oral Chemotherapy Pharmacist Encounter   Notified that patient is willing to pay out of pocket copay ($2555.30) for first fill of Revlimid while awaiting manufacturer assistance application status. I spoke with representative at [employer], Elmyra Ricks, who has reopened the patient's case and marked it as urgent/expedited so that medication can be shipped from [employer] today for patient to start on 12/30/20.  Called and spoke with Mr. Heckman and provided phone number to [place] [phone]). He was instructed to call and set up shipment of medication if he has not heard from [employer] by 4:30PM today.  Patient expressed appreciation and understanding. Patient knows to call the office with questions or concerns. I will meet with patient in clinic on 12/30/20 for initial counseling on Revlimd.   Leron Croak, PharmD, BCPS Hematology/Oncology Clinical Pharmacist [facility] [place] [phone] 12/27/2020 3:28 PM

## 2020-12-30 ENCOUNTER — Other Ambulatory Visit: Payer: Self-pay | Admitting: *Deleted

## 2020-12-30 ENCOUNTER — Inpatient Hospital Stay: Payer: Medicare Other

## 2020-12-30 ENCOUNTER — Other Ambulatory Visit: Payer: Self-pay

## 2020-12-30 ENCOUNTER — Telehealth: Payer: Self-pay | Admitting: *Deleted

## 2020-12-30 ENCOUNTER — Inpatient Hospital Stay (HOSPITAL_BASED_OUTPATIENT_CLINIC_OR_DEPARTMENT_OTHER): Payer: Medicare Other | Admitting: Hematology and Oncology

## 2020-12-30 VITALS — BP 127/76 | HR 76 | Temp 97.8°F | Resp 20 | Wt 166.0 lb

## 2020-12-30 DIAGNOSIS — Z95828 Presence of other vascular implants and grafts: Secondary | ICD-10-CM

## 2020-12-30 DIAGNOSIS — C8331 Diffuse large B-cell lymphoma, lymph nodes of head, face, and neck: Secondary | ICD-10-CM | POA: Diagnosis not present

## 2020-12-30 DIAGNOSIS — C8251 Diffuse follicle center lymphoma, lymph nodes of head, face, and neck: Secondary | ICD-10-CM

## 2020-12-30 DIAGNOSIS — C8511 Unspecified B-cell lymphoma, lymph nodes of head, face, and neck: Secondary | ICD-10-CM | POA: Diagnosis not present

## 2020-12-30 LAB — CMP (CANCER CENTER ONLY)
ALT: 23 U/L (ref 0–44)
AST: 18 U/L (ref 15–41)
Albumin: 3.2 g/dL — ABNORMAL LOW (ref 3.5–5.0)
Alkaline Phosphatase: 111 U/L (ref 38–126)
Anion gap: 10 (ref 5–15)
BUN: 12 mg/dL (ref 8–23)
CO2: 26 mmol/L (ref 22–32)
Calcium: 9.4 mg/dL (ref 8.9–10.3)
Chloride: 104 mmol/L (ref 98–111)
Creatinine: 0.69 mg/dL (ref 0.61–1.24)
GFR, Estimated: >60 mL/min (ref >60)
Glucose, Bld: 107 mg/dL — ABNORMAL HIGH (ref 70–99)
Potassium: 4.1 mmol/L (ref 3.5–5.1)
Sodium: 140 mmol/L (ref 135–145)
Total Bilirubin: 0.3 mg/dL (ref 0.3–1.2)
Total Protein: 6.8 g/dL (ref 6.5–8.1)

## 2020-12-30 LAB — CBC WITH DIFFERENTIAL (CANCER CENTER ONLY)
Abs Immature Granulocytes: 0.01 10*3/uL (ref 0.00–0.07)
Basophils Absolute: 0 10*3/uL (ref 0.0–0.1)
Basophils Relative: 1 %
Eosinophils Absolute: 0.4 10*3/uL (ref 0.0–0.5)
Eosinophils Relative: 11 %
HCT: 32.2 % — ABNORMAL LOW (ref 39.0–52.0)
Hemoglobin: 10.9 g/dL — ABNORMAL LOW (ref 13.0–17.0)
Immature Granulocytes: 0 %
Lymphocytes Relative: 6 %
Lymphs Abs: 0.2 10*3/uL — ABNORMAL LOW (ref 0.7–4.0)
MCH: 31.5 pg (ref 26.0–34.0)
MCHC: 33.9 g/dL (ref 30.0–36.0)
MCV: 93.1 fL (ref 80.0–100.0)
Monocytes Absolute: 0.5 10*3/uL (ref 0.1–1.0)
Monocytes Relative: 13 %
Neutro Abs: 2.8 10*3/uL (ref 1.7–7.7)
Neutrophils Relative %: 69 %
Platelet Count: 222 10*3/uL (ref 150–400)
RBC: 3.46 MIL/uL — ABNORMAL LOW (ref 4.22–5.81)
RDW: 13.1 % (ref 11.5–15.5)
WBC Count: 4 10*3/uL (ref 4.0–10.5)
nRBC: 0 % (ref 0.0–0.2)

## 2020-12-30 LAB — LACTATE DEHYDROGENASE: LDH: 252 U/L — ABNORMAL HIGH (ref 98–192)

## 2020-12-30 MED ORDER — HYDROMORPHONE HCL 2 MG PO TABS: 2.0000 mg | ORAL_TABLET | ORAL | 0 refills | Status: DC | PRN

## 2020-12-30 MED ORDER — SODIUM CHLORIDE 0.9 % IV SOLN: Freq: Once | INTRAVENOUS | Status: AC

## 2020-12-30 MED ORDER — DIPHENHYDRAMINE HCL 50 MG/ML IJ SOLN
50.0000 mg | Freq: Once | INTRAMUSCULAR | Status: AC
  Administered 2020-12-30: 50 mg via INTRAVENOUS
  Filled 2020-12-30: qty 1

## 2020-12-30 MED ORDER — SODIUM CHLORIDE 0.9% FLUSH
10.0000 mL | Freq: Once | INTRAVENOUS | Status: AC
  Administered 2020-12-30: 10 mL

## 2020-12-30 MED ORDER — FAMOTIDINE 20 MG IN NS 100 ML IVPB
20.0000 mg | Freq: Once | INTRAVENOUS | Status: AC
  Administered 2020-12-30: 20 mg via INTRAVENOUS
  Filled 2020-12-30: qty 100

## 2020-12-30 MED ORDER — ACETAMINOPHEN 325 MG PO TABS
650.0000 mg | ORAL_TABLET | Freq: Once | ORAL | Status: AC
Start: 2020-12-30 — End: 2020-12-30
  Administered 2020-12-30: 650 mg via ORAL
  Filled 2020-12-30: qty 2

## 2020-12-30 MED ORDER — SODIUM CHLORIDE 0.9% FLUSH
10.0000 mL | INTRAVENOUS | Status: DC | PRN
Start: 2020-12-30 — End: 2020-12-30
  Administered 2020-12-30: 10 mL

## 2020-12-30 MED ORDER — SODIUM CHLORIDE 0.9 % IV SOLN
20.0000 mg | Freq: Once | INTRAVENOUS | Status: AC
  Administered 2020-12-30: 20 mg via INTRAVENOUS
  Filled 2020-12-30: qty 20

## 2020-12-30 MED ORDER — HEPARIN SOD (PORK) LOCK FLUSH 100 UNIT/ML IV SOLN
500.0000 [IU] | Freq: Once | INTRAVENOUS | Status: AC | PRN
  Administered 2020-12-30: 500 [IU]

## 2020-12-30 MED ORDER — TAFASITAMAB-CXIX CHEMO INJECTION 200 MG
12.0000 mg/kg | Freq: Once | INTRAVENOUS | Status: AC
  Administered 2020-12-30: 1000 mg via INTRAVENOUS
  Filled 2020-12-30: qty 25

## 2020-12-30 NOTE — Patient Instructions (Addendum)
[facility]   Discharge Instructions: Thank you for choosing [facility] to provide your oncology and hematology care.   If you have a lab appointment with the [facility], please go directly to the [facility] and check in at the registration area.   Wear comfortable clothing and clothing appropriate for easy access to any Portacath or PICC line.   We strive to give you quality time with your provider. You may need to reschedule your appointment if you arrive late (15 or more minutes).  Arriving late affects you and other patients whose appointments are after yours.  Also, if you miss three or more appointments without notifying the office, you may be dismissed from the clinic at the provider's discretion.      For prescription refill requests, have your pharmacy contact our office and allow 72 hours for refills to be completed.    Today you received the following chemotherapy and/or immunotherapy agents: MONJUVI.      To help prevent nausea and vomiting after your treatment, we encourage you to take your nausea medication as directed.  BELOW ARE SYMPTOMS THAT SHOULD BE REPORTED IMMEDIATELY: *FEVER GREATER THAN 100.4 F (38 C) OR HIGHER *CHILLS OR SWEATING *NAUSEA AND VOMITING THAT IS NOT CONTROLLED WITH YOUR NAUSEA MEDICATION *UNUSUAL SHORTNESS OF BREATH *UNUSUAL BRUISING OR BLEEDING *URINARY PROBLEMS (pain or burning when urinating, or frequent urination) *BOWEL PROBLEMS (unusual diarrhea, constipation, pain near the anus) TENDERNESS IN MOUTH AND THROAT WITH OR WITHOUT PRESENCE OF ULCERS (sore throat, sores in mouth, or a toothache) UNUSUAL RASH, SWELLING OR PAIN  UNUSUAL VAGINAL DISCHARGE OR ITCHING   Items with * indicate a potential emergency and should be followed up as soon as possible or go to the Emergency Department if any problems should occur.  Please show the CHEMOTHERAPY ALERT CARD or IMMUNOTHERAPY ALERT CARD at check-in to  the Emergency Department and triage nurse.  Should you have questions after your visit or need to cancel or reschedule your appointment, please contact [facility]  Dept: [phone]  and follow the prompts.  Office hours are 8:00 a.m. to 4:30 p.m. Monday - Friday. Please note that voicemails left after 4:00 p.m. may not be returned until the following business day.  We are closed weekends and major holidays. You have access to a nurse at all times for urgent questions. Please call the main number to the clinic Dept: [phone] and follow the prompts.   For any non-urgent questions, you may also contact your provider using MyChart. We now offer e-Visits for anyone 35 and older to request care online for non-urgent symptoms. For details visit [email or web address].   Also download the MyChart app! Go to the app store, search "MyChart", open the app, select [facility], and log in with your MyChart username and password.  Due to Covid, a mask is required upon entering the hospital/clinic. If you do not have a mask, one will be given to you upon arrival. For doctor visits, patients may have 1 support person aged 70 or older with them. For treatment visits, patients cannot have anyone with them due to current Covid guidelines and our immunocompromised population.   Tafasitamab Injection What is this medication? TAFASITAMAB (ta fa sit a mab) is a monoclonal antibody. It is used to treat diffuse large B-cell lymphoma. This medicine may be used for other purposes; ask your health care provider or pharmacist if you have questions. COMMON BRAND NAME(S): MONJUVI  What should I tell my care team before I take this medication? They need to know if you have any of these conditions: infection (especially a viral infection such as chickenpox, cold sores, or herpes) an unusual or allergic reaction to tafasitamab, other medicines, foods, dyes, or preservatives pregnant or trying  to get pregnant breast-feeding How should I use this medication? This medicine is for infusion into a vein. It is usually given by a health care professional in a hospital or clinic setting. Talk to your pediatrician about the use of this medicine in children. Special care may be needed. Overdosage: If you think you have taken too much of this medicine contact a poison control center or emergency room at once. NOTE: This medicine is only for you. Do not share this medicine with others. What if I miss a dose? It is important not to miss your dose. Call your doctor or health care professional if you are unable to keep an appointment. What may interact with this medication? Interactions have not been studied. Give your health care provider a list of all the medicines, herbs, non-prescription drugs, or dietary supplements you use. Also tell them if you smoke, drink alcohol, or use illegal drugs. Some items may interact with your medicine. This list may not describe all possible interactions. Give your health care provider a list of all the medicines, herbs, non-prescription drugs, or dietary supplements you use. Also tell them if you smoke, drink alcohol, or use illegal drugs. Some items may interact with your medicine. What should I watch for while using this medication? Your condition will be monitored carefully while you are receiving this medicine. You may need blood work done while you are taking this medicine. This medicine may increase your risk of getting an infection. Call your health care professional for advice if you get a fever, chills, or sore throat, or other symptoms of a cold or flu. Do not treat yourself. Try to avoid being around people who are sick. Do not become pregnant while taking this medicine or for at least 3 months after stopping it. Women should inform their doctor if they wish to become pregnant or think they might be pregnant. There is a potential for serious side effects  to an unborn child. Talk to your health care professional or pharmacist for more information. Do not breast-feed an infant while taking this medicine or for at least 3 months after stopping it. What side effects may I notice from receiving this medication? Side effects that you should report to your doctor or health care professional as soon as possible: allergic reactions like skin rash, itching or hives, swelling of the face, lips, or tongue low blood counts - this medicine may decrease the number of white blood cells, red blood cells and platelets; you may be at increased risk for infections and bleeding facial flushing shortness of breath signs and symptoms of infection like fever; chills; cough; sore throat; pain or trouble passing urine Side effects that usually do not require medical attention (report these to your doctor or health care professional if they continue or are bothersome): back pain constipation decreased appetite diarrhea swelling of the ankles, feet, hands tiredness This list may not describe all possible side effects. Call your doctor for medical advice about side effects. You may report side effects to FDA at 1-800-FDA-1088. Where should I keep my medication? This drug is given in a hospital or clinic and will not be stored at home. NOTE: This sheet is  a summary. It may not cover all possible information. If you have questions about this medicine, talk to your doctor, pharmacist, or health care provider.  2022 [employer]/Gold Standard (2018-10-20 10:18:10)

## 2020-12-30 NOTE — Progress Notes (Signed)
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 Lorenso Courier  4) 02/22/2020: PET CT scan showed Deauville 4 and Deauville 5 adenopathy bilaterally in the neck. There is also a Deauville 4 left supraclavicular lymph node and a Deauville 4 right gastric lymph node 5) 04/24/2020: presents with rapid enlargement of his right submandibular lymph node.  6) 05/20/2020: partial excision of right submandibular lymph node with attempted fluid drainage. Pathology consistent with follicular lymphoma, no clear evidence of transformation.  7) 06/12/2020: Cycle 1 Day 1 of R-Benda. 8) 07/10/2020: Cycle 2 Day 1 of R-Benda. 9) 08/07/2020: Cycle 3 Day 1 of R-Benda. Noted to have enlargement of right cervical lymph node on exam.  10) 08/28/2020: Cycle 1 Day 1 of R-miniCHOP 11) 09/20/2020: Cycle 2 Day 2 of R-miniCHOP 12) 10/11/2020: Cycle 3 of R-miniCHOP HELD due to progression. 13) 10/30/2020: start of palliative radiation therapy 14) 12/30/2020: Cycle 1 Day 1 of Monjuvi/Lenalidomide  Interval History:  Brian Castillo 80 y.o. 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 The patient's last visit was on 12/25/2020. In the interim since the last visit he has received his  revlimid pills.   On exam today Brian Castillo notes he is eager to start treatment with Monjuvi.  He notes he received his Revlimid therapy and took his first dose morning.  He notes no infectious symptoms, though his large lymph node has further ulcerated and continues draining.  He reports that he is not having any issues with fevers, chills, sweats, nausea, vomiting or diarrhea.  A full 10 point ROS is listed below.  The bulk of our discussion focused on assuring he had everything necessary to start therapy with Monjuvi and lenalidomide.  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   Seizures ([place])    last seizure was in 1988    SURGICAL HISTORY: Past Surgical History:  Procedure Laterality Date   CARPOMETACARPEL SUSPENSION PLASTY Right 08/24/2014   Procedure: RIGHT CMC ARTHOPLASTY WITH DOUBLE TENDON TRANSFER AND REPAIR  RECONSTRUCTION ;  Surgeon: Roseanne Kaufman, MD;  Location: [facility];  Service: Orthopedics;  Laterality: Right;   CHOLECYSTECTOMY     COLONOSCOPY     IR IMAGING GUIDED PORT INSERTION  08/14/2020   IR RADIOLOGIST EVAL & MGMT  08/20/2020   KNEE ARTHROSCOPY  2003   bilateral   POLYPECTOMY      SOCIAL HISTORY: Social History   Socioeconomic History   Marital  status: Married    Spouse name: Not on file   Number of children: Not on file   Years of education: Not on file   Highest education level: Not on file  Occupational History   Not on file  Tobacco Use   Smoking status: Former   Smokeless tobacco: Never   Tobacco comments:    quit 1984  Vaping Use   Vaping Use: Never used  Substance and Sexual Activity   Alcohol use: Yes    Alcohol/week: 6.0 standard drinks    Types: 6  Glasses of wine per week   Drug use: No   Sexual activity: Not on file  Other Topics Concern   Not on file  Social History Narrative   Not on file   Social Determinants of Health   Financial Resource Strain: Not on file  Food Insecurity: Not on file  Transportation Needs: Not on file  Physical Activity: Not on file  Stress: Not on file  Social Connections: Not on file  Intimate Partner Violence: Not on file    FAMILY HISTORY: Family History  Problem Relation Age of Onset   Colon cancer Neg Hx    Colon polyps Neg Hx     ALLERGIES:  is allergic to brimonidine tartrate-timolol, lisinopril, and netarsudil dimesylate.  MEDICATIONS:  Current Outpatient Medications  Medication Sig Dispense Refill   HYDROmorphone (DILAUDID) 2 MG tablet Take 1 tablet (2 mg total) by mouth every 4 (four) hours as needed for severe pain. 60 tablet 0   acetaminophen (TYLENOL) 325 MG tablet Take 2 tablets (650 mg total) by mouth every 4 (four) hours as needed for mild pain, moderate pain, fever or headache (or Fever >/= 101).     allopurinol (ZYLOPRIM) 300 MG tablet TAKE 1 TABLET BY MOUTH EVERY DAY 90 tablet 1   amLODipine (NORVASC) 5 MG tablet Take 5 mg by mouth Daily.      aspirin 81 MG tablet Take 81 mg by mouth daily.     Calcium Carb-Cholecalciferol (CALCIUM 600 + D PO) Take 1 tablet by mouth daily.     cholecalciferol (VITAMIN D) 1000 units tablet Take 1,000 Units by mouth daily.     gabapentin (NEURONTIN) 300 MG capsule Take 2 capsules (600 mg total) by mouth 3 (three) times daily. Take 1 capsule (300 mg total) by mouth twice a day and 2 capsules (656m) at bedtime (Patient taking differently: Take 600 mg by mouth 3 (three) times daily.) 180 capsule 1   Latanoprostene Bunod (VYZULTA) 0.024 % SOLN Place 1 drop into both eyes at bedtime.     lenalidomide (REVLIMID) 25 MG capsule Take 1 capsule (25 mg total) by mouth daily. Take for 21 days, then none for 7 days. Repeat as ordered. [employer] Auth #  [id number] Date Obtained 12/18/20 21 capsule 0   lidocaine-prilocaine (EMLA) cream Apply 1 application topically as needed. (Patient taking differently: Apply 1 application topically daily as needed (port access).) 30 g 0   metoprolol succinate (TOPROL-XL) 25 MG 24 hr tablet Take 25 mg by mouth Daily.      phenytoin (DILANTIN) 100 MG ER capsule Take 200 mg by mouth 2 (two) times daily.     polyethylene glycol (MIRALAX / GLYCOLAX) 17 g packet Take 17 g by mouth daily. 14 each 0   rosuvastatin (CRESTOR) 20 MG tablet Take 20 mg by mouth daily.     timolol (BETIMOL) 0.5 % ophthalmic solution Place 1 drop into both eyes 2 (two) times daily.  No current facility-administered medications for this visit.   Facility-Administered Medications Ordered in Other Visits  Medication Dose Route Frequency Provider Last Rate Last Admin   dexamethasone (DECADRON) 20 mg in sodium chloride 0.9 % 50 mL IVPB  20 mg Intravenous Once Orson Slick, MD       famotidine (PEPCID) IVPB 20 mg in NS 100 mL IVPB  20 mg Intravenous Once Castillo Peoples IV, MD       heparin lock flush 100 unit/mL  500 Units Intracatheter Once PRN Castillo Peoples IV, MD       sodium chloride flush (NS) 0.9 % injection 10 mL  10 mL Intracatheter PRN Orson Slick, MD       [id number]) 1,000 mg in sodium chloride 0.9 % 225 mL (4 mg/mL) chemo infusion  12 mg/kg (Treatment Plan Recorded) Intravenous Once Orson Slick, MD        REVIEW OF SYSTEMS:   Constitutional: ( - ) fevers, ( - )  chills , ( - ) night sweats Eyes: ( - ) blurriness of vision, ( - ) double vision, ( - ) watery eyes Ears, nose, mouth, throat, and face: ( - ) mucositis, ( - ) sore throat Respiratory: ( - ) cough, ( - ) dyspnea, ( - ) wheezes Cardiovascular: ( - ) palpitation, ( - ) chest discomfort, ( - ) lower extremity swelling Gastrointestinal:  ( - ) nausea, ( - ) heartburn, ( - ) change in bowel habits Skin: ( - ) abnormal skin rashes Lymphatics: (  - ) new lymphadenopathy, ( - ) easy bruising Neurological: ( - ) numbness, ( - ) tingling, ( - ) new weaknesses Behavioral/Psych: ( - ) mood change, ( - ) new changes  All other systems were reviewed with the patient and are negative.  PHYSICAL EXAMINATION: ECOG PERFORMANCE STATUS: 1 - Symptomatic but completely ambulatory  Vitals:   12/30/20 1200  BP: 127/76  Pulse: 76  Resp: 20  Temp: 97.8 F (36.6 C)  SpO2: 100%    Filed Weights   12/30/20 1200  Weight: 166 lb (75.3 kg)     GENERAL: well appearing elderly Caucasian male in NAD  SKIN: skin color, texture, turgor are normal, no rashes or significant lesions EYES: conjunctiva are pink and non-injected, sclera clear NECK: supple, non-tender LYMPH:  Massive right sided submandibular lymph node. Size is increasing rapidly, large confluent ulcers have developed that drain. Otherwise no other marked palpable lymphadenopathy in the cervical, axillary or supraclavicular lymph nodes.  LUNGS: clear to auscultation and percussion with normal breathing effort HEART: regular rate & rhythm and no murmurs and no lower extremity edema Musculoskeletal: no cyanosis of digits and no clubbing  PSYCH: alert & oriented x 3, fluent speech NEURO: no focal motor/sensory deficits  LABORATORY DATA:  I have reviewed the data as listed CBC Latest Ref Rng & Units 12/30/2020 12/25/2020 12/17/2020  WBC 4.0 - 10.5 K/uL 4.0 6.5 11.0(H)  Hemoglobin 13.0 - 17.0 g/dL 10.9(L) 10.9(L) 11.4(L)  Hematocrit 39.0 - 52.0 % 32.2(L) 32.1(L) 33.9(L)  Platelets 150 - 400 K/uL 222 265 162    CMP Latest Ref Rng & Units 12/30/2020 12/25/2020 12/17/2020  Glucose 70 - 99 mg/dL 107(H) 158(H) 129(H)  BUN 8 - 23 mg/dL 12 16 12   Creatinine 0.61 - 1.24 mg/dL 0.69 0.73 0.62  Sodium 135 - 145 mmol/L 140 139 139  Potassium 3.5 - 5.1 mmol/L 4.1 4.2 3.8  Chloride 98 -  111 mmol/L 104 104 103  CO2 22 - 32 mmol/L 26 27 30   Calcium 8.9 - 10.3 mg/dL 9.4 9.1 8.8(L)  Total Protein  6.5 - 8.1 g/dL 6.8 7.1 6.4(L)  Total Bilirubin 0.3 - 1.2 mg/dL 0.3 <0.1(L) 0.2(L)  Alkaline Phos 38 - 126 U/L 111 88 101  AST 15 - 41 U/L 18 26 26   ALT 0 - 44 U/L 23 35 28   RADIOGRAPHIC STUDIES: I have personally reviewed the radiological images as listed and agreed with the findings in the report: stable lymphadenopathy with exception of the rapidly enlarging submandibular lymph node.   DG Chest 2 View  Result Date: 12/10/2020 CLINICAL DATA:  Cough and fever EXAM: CHEST - 2 VIEW COMPARISON:  06/25/2020 FINDINGS: Coarsened lung markings over the lower lobes on the lateral view with scar-like appearance by prior chest CT February 2022. There is no edema, consolidation, effusion, or pneumothorax. Normal heart size and mediastinal contours. Unremarkable porta catheter positioning IMPRESSION: No evidence of active disease. Electronically Signed   By: Jorje Guild M.D.   On: 12/10/2020 05:18   DG Wrist Complete Left  Result Date: 12/05/2020 CLINICAL DATA:  Fall. EXAM: LEFT HAND - COMPLETE 3+ VIEW; LEFT WRIST - COMPLETE 3+ VIEW COMPARISON:  None. FINDINGS: No acute fracture or dislocation. Prior trapeziiectomy. Mild osteoarthritis of the DIP joints and thumb IP joint. Bone mineralization is normal. Soft tissues are unremarkable. IMPRESSION: 1. No acute osseous abnormality. Electronically Signed   By: Titus Dubin M.D.   On: 12/05/2020 18:19   CT Head Wo Contrast  Result Date: 12/05/2020 CLINICAL DATA:  Recent fall with left facial trauma and pain, initial encounter EXAM: CT HEAD WITHOUT CONTRAST CT MAXILLOFACIAL WITHOUT CONTRAST CT CERVICAL SPINE WITHOUT CONTRAST TECHNIQUE: Multidetector CT imaging of the head, cervical spine, and maxillofacial structures were performed using the standard protocol without intravenous contrast. Multiplanar CT image reconstructions of the cervical spine and maxillofacial structures were also generated. COMPARISON:  09/11/2020 FINDINGS: CT HEAD FINDINGS Brain: No  evidence of acute infarction, hemorrhage, hydrocephalus, extra-axial collection or mass lesion/mass effect. Mild atrophic changes are noted. Vascular: No hyperdense vessel or unexpected calcification. Skull: Normal. Negative for fracture or focal lesion. Other: None. CT MAXILLOFACIAL FINDINGS Osseous: Mildly downward displaced distal nasal bone fracture is noted best seen on image number 48 of series 9. This is noted centrally at the bridge of the nose. No other fractures are seen. No other bony abnormality is noted. Orbits: The orbits and their contents are within normal limits. No muscular abnormality is noted. Sinuses: Paranasal sinuses are well aerated. No mucosal abnormality is seen. Soft tissues: Surrounding soft tissue structures again show an enlarged soft tissue mass lesion similar to that seen on prior CT of the neck. It demonstrates changes of central necrosis as well as some air within which may be related to recent biopsy. Clinical correlation is recommended. This lesion now measures 6.3 x 4.8 cm which is increased from the prior exam at which time it measured 4.9 x 3.3 cm. Some surrounding soft tissue edema is noted. Some extension along the masseter muscle on the right is noted she was not appreciated on the prior exam. To soft tissue foci are seen. One of these measures approximately 15 mm and the second approximally 18 mm. Mild soft tissue swelling in the forehead is noted. CT CERVICAL SPINE FINDINGS Alignment: Within normal limits. Skull base and vertebrae: Cervical segments are well visualized. Vertebral body height is well maintained. Disc space narrowing is  noted from C4-C7 with mild associated osteophytic changes. Multilevel facet hypertrophic changes are seen. No acute fracture or acute facet abnormality is noted. Neural foraminal narrowing is noted bilaterally. Soft tissues and spinal canal: Surrounding soft tissue structures again show a mass lesion in the right lateral neck increased in  size from the prior study in June of 2022 with some central necrosis and air identified. Upper chest: Visualized lung apices are within normal limits. Other: None IMPRESSION: CT of the head: Mild atrophic changes without acute intracranial abnormality. CT of the maxillofacial bones: Acute nasal bone fracture involving the bridge distally. No other bony abnormality is seen. Soft tissue mass lesion in the right lateral neck similar to this seen on prior CT examination from June of 2022 although increased in size as described. Some additional soft tissue components extending along the masseter muscle on the right are noted new from the prior exam. This is consistent with the given clinical history of lymphoma. Mild left forehead hematoma is seen. CT of the cervical spine: Multilevel degenerative changes without acute abnormality. Electronically Signed   By: Inez Catalina M.D.   On: 12/05/2020 17:23   CT Soft Tissue Neck W Contrast  Result Date: 12/10/2020 CLINICAL DATA:  Lymphoma under treatment. Malaise and fever. Ulcerated right lateral neck, site of radiotherapy EXAM: CT NECK WITH CONTRAST TECHNIQUE: Multidetector CT imaging of the neck was performed using the standard protocol following the bolus administration of intravenous contrast. CONTRAST:  69m OMNIPAQUE IOHEXOL 350 MG/ML SOLN COMPARISON:  09/11/2020 FINDINGS: Pharynx and larynx: No evidence of mass or thickening of Waldeyer's ring. Palatine tonsilliths Salivary glands: Right submandibular mass involving the parotid tail and submandibular glands, progressed in size, and necrotic with central low-density, gas, and opening to the skin surface. The mass measures up to 7 cm in span. Increased solid tumor invasion into the right parotid and right anterior masseter. The mass is closely associated with the right facial artery, without visible pseudoaneurysm. Thyroid: Normal Lymph nodes: It is unclear if the right neck mass was initially a node. No other enlarged  or heterogeneous nodes. Vascular: As above. Atheromatous calcifications and right IJ porta catheter. 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 Visualized orbits: Negative Mastoids and visualized paranasal sinuses: Clear Skeleton: Patient's mass is in continuity with the right mandibular body without asymmetric bone loss or erosion. Upper chest: 2 new ground-glass nodules in the right upper lobe measuring 1 cm. IMPRESSION: 1. ~7 cm trans spatial right neck mass with marked size progression from June 2020 and extensive necrosis with evidence of draining to the skin. The right facial artery traverses the necrotic mass. 2. Two new ground-glass nodules in the right upper lobe which could be lymphomatous. Electronically Signed   By: JJorje GuildM.D.   On: 12/10/2020 07:39   CT Cervical Spine Wo Contrast  Result Date: 12/05/2020 CLINICAL DATA:  Recent fall with left facial trauma and pain, initial encounter EXAM: CT HEAD WITHOUT CONTRAST CT MAXILLOFACIAL WITHOUT CONTRAST CT CERVICAL SPINE WITHOUT CONTRAST TECHNIQUE: Multidetector CT imaging of the head, cervical spine, and maxillofacial structures were performed using the standard protocol without intravenous contrast. Multiplanar CT image reconstructions of the cervical spine and maxillofacial structures were also generated. COMPARISON:  09/11/2020 FINDINGS: CT HEAD FINDINGS Brain: No evidence of acute infarction, hemorrhage, hydrocephalus, extra-axial collection or mass lesion/mass effect. Mild atrophic changes are noted. Vascular: No hyperdense vessel or unexpected calcification. Skull: Normal. Negative for fracture or focal lesion. Other: None. CT  MAXILLOFACIAL FINDINGS Osseous: Mildly downward displaced distal nasal bone fracture is noted best seen on image number 48 of series 9. This is noted centrally at the bridge of the nose. No other fractures are seen. No other bony abnormality is noted. Orbits: The orbits and their  contents are within normal limits. No muscular abnormality is noted. Sinuses: Paranasal sinuses are well aerated. No mucosal abnormality is seen. Soft tissues: Surrounding soft tissue structures again show an enlarged soft tissue mass lesion similar to that seen on prior CT of the neck. It demonstrates changes of central necrosis as well as some air within which may be related to recent biopsy. Clinical correlation is recommended. This lesion now measures 6.3 x 4.8 cm which is increased from the prior exam at which time it measured 4.9 x 3.3 cm. Some surrounding soft tissue edema is noted. Some extension along the masseter muscle on the right is noted she was not appreciated on the prior exam. To soft tissue foci are seen. One of these measures approximately 15 mm and the second approximally 18 mm. Mild soft tissue swelling in the forehead is noted. CT CERVICAL SPINE FINDINGS Alignment: Within normal limits. Skull base and vertebrae: Cervical segments are well visualized. Vertebral body height is well maintained. Disc space narrowing is noted from C4-C7 with mild associated osteophytic changes. Multilevel facet hypertrophic changes are seen. No acute fracture or acute facet abnormality is noted. Neural foraminal narrowing is noted bilaterally. Soft tissues and spinal canal: Surrounding soft tissue structures again show a mass lesion in the right lateral neck increased in size from the prior study in June of 2022 with some central necrosis and air identified. Upper chest: Visualized lung apices are within normal limits. Other: None IMPRESSION: CT of the head: Mild atrophic changes without acute intracranial abnormality. CT of the maxillofacial bones: Acute nasal bone fracture involving the bridge distally. No other bony abnormality is seen. Soft tissue mass lesion in the right lateral neck similar to this seen on prior CT examination from June of 2022 although increased in size as described. Some additional soft  tissue components extending along the masseter muscle on the right are noted new from the prior exam. This is consistent with the given clinical history of lymphoma. Mild left forehead hematoma is seen. CT of the cervical spine: Multilevel degenerative changes without acute abnormality. Electronically Signed   By: Inez Catalina M.D.   On: 12/05/2020 17:23   CT BIOPSY  Result Date: 12/17/2020 INDICATION: 80 year old male with history of lymphoma neutropenia. EXAM: CT-GUIDED BONE MARROW BIOPSY AND ASPIRATION MEDICATIONS: None ANESTHESIA/SEDATION: Fentanyl 100 mcg IV; Versed 2 mg IV Sedation Time: 10 minutes; The patient was continuously monitored during the procedure by the interventional radiology nurse under my direct supervision. COMPLICATIONS: None immediate. PROCEDURE: Informed consent was obtained from the patient following an explanation of the procedure, risks, benefits and alternatives. The patient understands, agrees and consents for the procedure. All questions were addressed. A time out was performed prior to the initiation of the procedure. The patient was positioned prone and non-contrast localization CT was performed of the pelvis to demonstrate the iliac marrow spaces. The operative site was prepped and draped in the usual sterile fashion. Under sterile conditions and local anesthesia, a 22 gauge spinal needle was utilized for procedural planning. Next, an 11 gauge coaxial bone biopsy needle was advanced into the right iliac marrow space. Needle position was confirmed with CT imaging. Initially, a bone marrow aspiration was performed. Next, a bone  marrow biopsy was obtained with the 11 gauge outer bone marrow device. Samples were prepared with the cytotechnologist and deemed adequate. The needle was removed and superficial hemostasis was obtained with manual compression. A dressing was applied. The patient tolerated the procedure well without immediate post procedural complication. IMPRESSION:  Successful CT guided right iliac bone marrow aspiration and core biopsy. Ruthann Cancer, MD [facility] Electronically Signed   By: Ruthann Cancer M.D.   On: 12/17/2020 12:46   DG Knee Complete 4 Views Left  Result Date: 12/05/2020 CLINICAL DATA:  Fall. EXAM: LEFT KNEE - COMPLETE 4+ VIEW COMPARISON:  None. FINDINGS: No acute fracture or dislocation. No joint effusion. Mild medial compartment joint space narrowing. Bone mineralization is normal. Soft tissues are unremarkable. IMPRESSION: 1. No acute osseous abnormality. 2. Mild medial compartment osteoarthritis. Electronically Signed   By: Titus Dubin M.D.   On: 12/05/2020 18:16   DG Hand Complete Left  Result Date: 12/05/2020 CLINICAL DATA:  Fall. EXAM: LEFT HAND - COMPLETE 3+ VIEW; LEFT WRIST - COMPLETE 3+ VIEW COMPARISON:  None. FINDINGS: No acute fracture or dislocation. Prior trapeziiectomy. Mild osteoarthritis of the DIP joints and thumb IP joint. Bone mineralization is normal. Soft tissues are unremarkable. IMPRESSION: 1. No acute osseous abnormality. Electronically Signed   By: Titus Dubin M.D.   On: 12/05/2020 18:19   CT BONE MARROW BIOPSY & ASPIRATION  Result Date: 12/17/2020 INDICATION: 80 year old male with history of lymphoma neutropenia. EXAM: CT-GUIDED BONE MARROW BIOPSY AND ASPIRATION MEDICATIONS: None ANESTHESIA/SEDATION: Fentanyl 100 mcg IV; Versed 2 mg IV Sedation Time: 10 minutes; The patient was continuously monitored during the procedure by the interventional radiology nurse under my direct supervision. COMPLICATIONS: None immediate. PROCEDURE: Informed consent was obtained from the patient following an explanation of the procedure, risks, benefits and alternatives. The patient understands, agrees and consents for the procedure. All questions were addressed. A time out was performed prior to the initiation of the procedure. The patient was positioned prone and  non-contrast localization CT was performed of the pelvis to demonstrate the iliac marrow spaces. The operative site was prepped and draped in the usual sterile fashion. Under sterile conditions and local anesthesia, a 22 gauge spinal needle was utilized for procedural planning. Next, an 11 gauge coaxial bone biopsy needle was advanced into the right iliac marrow space. Needle position was confirmed with CT imaging. Initially, a bone marrow aspiration was performed. Next, a bone marrow biopsy was obtained with the 11 gauge outer bone marrow device. Samples were prepared with the cytotechnologist and deemed adequate. The needle was removed and superficial hemostasis was obtained with manual compression. A dressing was applied. The patient tolerated the procedure well without immediate post procedural complication. IMPRESSION: Successful CT guided right iliac bone marrow aspiration and core biopsy. Ruthann Cancer, MD [facility] Radiology Electronically Signed   By: Ruthann Cancer M.D.   On: 12/17/2020 12:46   [place] CORE BIOPSY (SALIVARY GLAND/PAROTID GLAND)  Result Date: 12/06/2020 INDICATION: 80 year old with a large right neck mass and follicular lymphoma. Patient has new lesions on the right side of the face. EXAM: Ultrasound-guided core biopsy of right parotid gland lesion MEDICATIONS: Moderate sedation ANESTHESIA/SEDATION: Moderate (conscious) sedation was employed during this procedure. A total of Versed 1.0 mg and Fentanyl 50 mcg was administered intravenously. Moderate Sedation Time: 15 minutes. The patient's level of consciousness and vital signs were monitored continuously by radiology nursing throughout the procedure under my direct supervision.  FLUOROSCOPY TIME:  None COMPLICATIONS: None immediate. PROCEDURE: Informed written consent was obtained from the patient after a thorough discussion of the procedural risks, benefits and alternatives. All questions were  addressed. A timeout was performed prior to the initiation of the procedure. Patient has visible and palpable lesions on the right side of the face above the right neck mass. This area was evaluated with ultrasound. Two nodules in this area correspond with the recent CT imaging. Right side of the face was prepped with chlorhexidine and sterile field was created. Skin and soft tissues were anesthetized using 1% lidocaine. A tiny incision was made on the right side of the neck. Using ultrasound guidance, a total of 5 core biopsies were obtained with an 18 gauge [place] device in the right parotid lesion. Specimens placed in saline. Bandage placed over the puncture site. FINDINGS: Large mass in the right upper neck. There are 2 visible nodules on the right side of the face anterior to the right ear. These correspond with nodules on the recent CT imaging. There is 1 nodule within the anterior right parotid gland that measures 1.5 x 0.9 x 1.5 cm. This nodule was biopsied. There is a larger nodule anterior to the right parotid nodule that is more hypoechoic and heterogeneous. This nodule is involving the right masseter muscle based on the CT and is contiguous with the larger right neck mass based on sonography. Morphology of these new right facial nodules are similar to the larger right neck mass. IMPRESSION: Ultrasound-guided core biopsies of the right parotid lesion. Electronically Signed   By: Markus Daft M.D.   On: 12/06/2020 15:35   CT Maxillofacial WO CM  Result Date: 12/05/2020 CLINICAL DATA:  Recent fall with left facial trauma and pain, initial encounter EXAM: CT HEAD WITHOUT CONTRAST CT MAXILLOFACIAL WITHOUT CONTRAST CT CERVICAL SPINE WITHOUT CONTRAST TECHNIQUE: Multidetector CT imaging of the head, cervical spine, and maxillofacial structures were performed using the standard protocol without intravenous contrast. Multiplanar CT image reconstructions of the cervical spine and maxillofacial structures  were also generated. COMPARISON:  09/11/2020 FINDINGS: CT HEAD FINDINGS Brain: No evidence of acute infarction, hemorrhage, hydrocephalus, extra-axial collection or mass lesion/mass effect. Mild atrophic changes are noted. Vascular: No hyperdense vessel or unexpected calcification. Skull: Normal. Negative for fracture or focal lesion. Other: None. CT MAXILLOFACIAL FINDINGS Osseous: Mildly downward displaced distal nasal bone fracture is noted best seen on image number 48 of series 9. This is noted centrally at the bridge of the nose. No other fractures are seen. No other bony abnormality is noted. Orbits: The orbits and their contents are within normal limits. No muscular abnormality is noted. Sinuses: Paranasal sinuses are well aerated. No mucosal abnormality is seen. Soft tissues: Surrounding soft tissue structures again show an enlarged soft tissue mass lesion similar to that seen on prior CT of the neck. It demonstrates changes of central necrosis as well as some air within which may be related to recent biopsy. Clinical correlation is recommended. This lesion now measures 6.3 x 4.8 cm which is increased from the prior exam at which time it measured 4.9 x 3.3 cm. Some surrounding soft tissue edema is noted. Some extension along the masseter muscle on the right is noted she was not appreciated on the prior exam. To soft tissue foci are seen. One of these measures approximately 15 mm and the second approximally 18 mm. Mild soft tissue swelling in the forehead is noted. CT CERVICAL SPINE FINDINGS Alignment: Within normal limits. Skull  base and vertebrae: Cervical segments are well visualized. Vertebral body height is well maintained. Disc space narrowing is noted from C4-C7 with mild associated osteophytic changes. Multilevel facet hypertrophic changes are seen. No acute fracture or acute facet abnormality is noted. Neural foraminal narrowing is noted bilaterally. Soft tissues and spinal canal: Surrounding soft  tissue structures again show a mass lesion in the right lateral neck increased in size from the prior study in June of 2022 with some central necrosis and air identified. Upper chest: Visualized lung apices are within normal limits. Other: None IMPRESSION: CT of the head: Mild atrophic changes without acute intracranial abnormality. CT of the maxillofacial bones: Acute nasal bone fracture involving the bridge distally. No other bony abnormality is seen. Soft tissue mass lesion in the right lateral neck similar to this seen on prior CT examination from June of 2022 although increased in size as described. Some additional soft tissue components extending along the masseter muscle on the right are noted new from the prior exam. This is consistent with the given clinical history of lymphoma. Mild left forehead hematoma is seen. CT of the cervical spine: Multilevel degenerative changes without acute abnormality. Electronically Signed   By: Inez Catalina M.D.   On: 12/05/2020 17:23    ASSESSMENT & PLAN Brian Castillo 80 y.o. 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 The patient's last visit was on 02/12/2020 at which time he establish care.  After review the labs, the records, discussion with the patient the findings are most consistent with a stage III follicular lymphoma based on the PET CT scan.  There are lymph nodes on both sides of diaphragm and therefore stage III would be the most appropriate designation.    Previously we discussed the nature of follicular lymphoma and the treatment options moving forward.  The options at this time would be observation, immunotherapy, or combination of chemotherapy and immunotherapy.  Given the lack of symptoms, cytopenias, and bulky disease at the time of presentation we decided to proceed with observation alone. Unfortunately due to rapid progression we need to consider treatment moving forward.  Additionally  we discussed rituximab monotherapy with weekly dosing x4 doses followed by every 2 months dosing x4 as a treatment option as well as bendamustine and rituximab on days 1 and 2 of a 28-day cycle x6.  We discussed the side effects, risks and benefits, and possible outcomes of treatment.  The patient voiced his understanding of these options and wished to proceed with R-Benda.   Over the period of one month the patient was found to have a rapidly enlarging right submandibular lymph node.  Due to concern for this enlarging lymph node we ordered a CT scan of the chest abdomen pelvis to assess for progression elsewhere, but that was the only lymph node that was progressing. 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   On PET CT scan from 08/23/2020 the patient showed complete response of disease with every site with the exception of his right cervical lymph node which is increasing in size and brightness.  As such we have to discontinue first-line therapy with rituximab and Bendamustine and transition over to second line R mini CHOP.  R mini CHOP chemotherapy Cycle 1 Day1 started on 08/28/2020.  Unfortunately when he came time to proceed to cycle 3 it was evident that the lymph node in the neck had started to grow again  rapidly.  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  We are currently planning for 2nd line therapy with R-miniCHOP(which would also be effective if patient is having transformation of the tumor).  -- patient completed PET, Port, and TTE prior to start of R-miniCHOP. --prior PET CT findings are consistent with a Stage III follicular lymphoma. Due to rapidly enlarging submandibular lymph node we proceeded with treatment.  --imaging shows stable disease save for a single enlarging submandibular lymph node.  Prior biopsy has shown  transformation into a more aggressive lymphoma.  --started Cycle 1 Day 1 of R-Benda chemotherapy on 06/12/2020 --completed Cycle 3 Day of R-Benda. Unfortunately he had progression for which we changed his therapy to R-miniCHOP.  --08/28/2020 was Day 1 of Cycle 1 of R-miniCHOP. Prior to start of Cycle 3 Day 1 he was noted to have increased size of the lymph node.  Plan: -- proceed with Monjuvi +lenalidomide 21m PO  --today is Cycle 1 Day 1 of Monjuvi/Revlimid --RTC next week to reassess  #Neutropenia, resolved --received GCSF during hospitalization for neutropenia --WBC currently >4.0 with normal ANC --continue to monitor   #Pain Control, improved --patient is having severe pain in his midline neck up to his scalp --extensive workup including CT/MRI scans of head/neck and LP have revealed no clear etiology for his findings --pain is responding well to gabapentin 300 BID with flexeril and dilaudid at night.  --prescribed oxycodone to 15 mg PO q 4H PRN with ibuprofen 6040mQ6H PRN. (Not currently taking this) --Dilaudid 2 mg p.o.q 4 hour PRN, mostly used at night.  --Continue to monitor  #Medication Interaction --Allopurinol increases the levels of Dilantin. --We will work with the patient's primary care provider Dr. JoShon Batono monitor his Dilantin levels and adjust accordingly. For now we are following this weekly.  #Fever, resolved --Single episode of fever up to 101 F occurred on 06/25/2020. --Patient was not neutropenic at that time and was evaluated emergency department with infectious work-up including chest x-ray, blood cultures, and urine culture.  Data at that time showed no evidence of infectious disease. --No further fevers noted since that time.  We will continue to monitor.  #Supportive Care --chemotherapy education completed --zofran 37m45m8H PRN and compazine 77m39m q6H for nausea -- allopurinol 300mg62mdaily for TLS prophylaxis -- port in place -- pain medication  as above   No orders of the defined types were placed in this encounter.  All questions were answered. The patient knows to call the clinic with any problems, questions or concerns.  A total of more than 30 minutes were spent on this encounter and over half of that time was spent on counseling and coordination of care as outlined above.   Brian Castillo PeoplesDepartment of Hematology/Oncology [facility]le[facility] Healthcaree: [phone]: [phone]: [email or web address]  12/30/2020 1:04 PM

## 2020-12-30 NOTE — Telephone Encounter (Signed)
Referral called to [facility] regarding ulcerated wound on her right neck.  Pt is aware and has agreed to this.

## 2020-12-30 NOTE — Progress Notes (Signed)
Calculated dose slightly outside of 10%. Spoke with MD Lorenso Courier and he would like to give 1000 mg dose Monjuvi.  Larene Beach, PharmD

## 2020-12-30 NOTE — Telephone Encounter (Signed)
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  Counseled patient on administration, dosing, side effects, monitoring, drug-food interactions, safe handling, storage, and disposal.  Patient will take Revlimid 76m capsules, 1 capsule by mouth once daily, without regard to food, with a full glass of water.  Revlimid will be given 21 days on, 7 days off, repeat every 28 days.  Revlimid start date: 12/30/20  Adverse effects of Revlimid include but are not limited to: nausea, constipation, diarrhea, abdominal pain, rash, fatigue, drug fever, and decreased blood counts.    Reviewed with patient importance of keeping a medication schedule and plan for any missed doses. No barriers to medication adherence identified.  Medication reconciliation performed and medication/allergy list updated.  Patient counseled on importance of daily aspirin 812mfor VTE prophylaxis.  Insurance authorization for Revlimid has been obtained.  Revlimid prescription is being dispensed from [facility] it is a limited distribution medication. Patient paid out of pocket for first fill of Revlimid while awaiting on status of application through manufacturer assistance. Patient's application for manufacturer assistance is currently pending through [employer].   All questions answered.  Mr. PoHoneymanoiced understanding and appreciation.   Medication education handout given to patient. Patient knows to call the office with questions or concerns. Oral Chemotherapy Clinic phone number provided to patient.   ReLeron CroakPharmD, BCPS Hematology/Oncology Clinical Pharmacist [phone]/17/2022 1:53 PM

## 2020-12-31 ENCOUNTER — Encounter: Payer: Self-pay | Admitting: Hematology and Oncology

## 2020-12-31 LAB — PHENYTOIN LEVEL, FREE AND TOTAL
Phenytoin, Free: 0.5 ug/mL — ABNORMAL LOW (ref 1.0–2.0)
Phenytoin, Total: 5 ug/mL — ABNORMAL LOW (ref 10.0–20.0)

## 2020-12-31 NOTE — Telephone Encounter (Signed)
Patient is approved for Revlimid at no cost from [employer] 12/31/20-03/15/21  [employer] uses [facility] [place] [facility] [place] Phone [phone] Fax [phone] 12/31/2020 11:46 AM

## 2020-12-31 NOTE — Progress Notes (Signed)
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 Lorenso Courier  4) 02/22/2020: PET CT scan showed Deauville 4 and Deauville 5 adenopathy bilaterally in the neck. There is also a Deauville 4 left supraclavicular lymph node and a Deauville 4 right gastric lymph node 5) 04/24/2020: presents with rapid enlargement of his right submandibular lymph node.  6) 05/20/2020: partial excision of right submandibular lymph node with attempted fluid drainage. Pathology consistent with follicular lymphoma, no clear evidence of transformation.  7) 06/12/2020: Cycle 1 Day 1 of R-Benda. 8) 07/10/2020: Cycle 2 Day 1 of R-Benda. 9) 08/07/2020: Cycle 3 Day 1 of R-Benda. Noted to have enlargement of right cervical lymph node on exam.  10) 08/28/2020: Cycle 1 Day 1 of R-miniCHOP 11) 09/20/2020: Cycle 2 Day 2 of R-miniCHOP 12) 10/11/2020: Cycle 3 of R-miniCHOP HELD due to progression. 13) 10/30/2020: start of palliative radiation therapy  Interval History:  Brian Castillo 80 y.o. 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 The patient's last visit was on 12/18/2020. In the interim since the last visit he has completed palliative radiation therapy to the problematic lymph node.    On exam today Brian Castillo is accompanied by his wife.  He notes that he continues to have difficulty with pain shooting through the mass and down his neck.  He notes he has been taking Dilaudid at night with some occasional extra doses throughout the day.  He notes he is also been trying to take Tylenol with this to alleviate the pain.  He reports eating and drinking is becoming a problem for him as it has pushed on the cheek.  It is also altered his speech slightly.  He is doing his best to keep his weight up and has been drinking well.  Boost at least 1/day.  Likely his assisted living facility is providing him with pured food and will cut up food so that he is able to adequately eat and food in his mouth.  He reports that he is not having any issues with fevers, chills, sweats, nausea, vomiting or diarrhea.  A full 10 point ROS is listed below.  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   Seizures ([place])    last seizure was in 1988    SURGICAL HISTORY: Past Surgical History:  Procedure Laterality Date   CARPOMETACARPEL SUSPENSION PLASTY Right 08/24/2014   Procedure: RIGHT CMC ARTHOPLASTY WITH DOUBLE TENDON TRANSFER AND REPAIR  RECONSTRUCTION ;  Surgeon: Roseanne Kaufman, MD;  Location: [facility];  Service: Orthopedics;  Laterality: Right;   CHOLECYSTECTOMY     COLONOSCOPY     IR IMAGING GUIDED PORT INSERTION  08/14/2020   IR RADIOLOGIST EVAL & MGMT  08/20/2020   KNEE ARTHROSCOPY  2003   bilateral   POLYPECTOMY      SOCIAL HISTORY: Social History   Socioeconomic History   Marital status: Married    Spouse name: Not on file   Number of children: Not on file   Years of education: Not on file    Highest education level: Not on file  Occupational History   Not on file  Tobacco Use   Smoking status: Former   Smokeless tobacco: Never   Tobacco comments:    quit 1984  Vaping Use   Vaping Use: Never used  Substance and Sexual Activity   Alcohol use: Yes    Alcohol/week: 6.0 standard drinks    Types: 6 Glasses of wine per week   Drug use: No   Sexual activity: Not on file  Other Topics Concern   Not on file  Social History Narrative   Not on file   Social Determinants of Health   Financial Resource Strain: Not on file  Food Insecurity: Not on file  Transportation Needs: Not on file  Physical Activity: Not on file  Stress: Not on file  Social Connections: Not on file  Intimate Partner Violence: Not on file    FAMILY HISTORY: Family History  Problem Relation Age of Onset   Colon cancer Neg Hx    Colon polyps Neg Hx     ALLERGIES:  is allergic to brimonidine tartrate-timolol, lisinopril, and netarsudil dimesylate.  MEDICATIONS:  Current Outpatient Medications  Medication Sig Dispense Refill   acetaminophen (TYLENOL) 325 MG tablet Take 2 tablets (650 mg total) by mouth every 4 (four) hours as needed for mild pain, moderate pain, fever or headache (or Fever >/= 101).     allopurinol (ZYLOPRIM) 300 MG tablet TAKE 1 TABLET BY MOUTH EVERY DAY 90 tablet 1   amLODipine (NORVASC) 5 MG tablet Take 5 mg by mouth Daily.      aspirin 81 MG tablet Take 81 mg by mouth daily.     Calcium Carb-Cholecalciferol (CALCIUM 600 + D PO) Take 1 tablet by mouth daily.     cholecalciferol (VITAMIN D) 1000 units tablet Take 1,000 Units by mouth daily.     gabapentin (NEURONTIN) 300 MG capsule Take 2 capsules (600 mg total) by mouth 3 (three) times daily. Take 1 capsule (300 mg total) by mouth twice a day and 2 capsules (630m) at bedtime (Patient taking differently: Take 600 mg by mouth 3 (three) times daily.) 180 capsule 1   HYDROmorphone (DILAUDID) 2 MG tablet Take 1 tablet (2 mg total) by  mouth every 4 (four) hours as needed for severe pain. 60 tablet 0   Latanoprostene Bunod (VYZULTA) 0.024 % SOLN Place 1 drop into both eyes at bedtime.     lenalidomide (REVLIMID) 25 MG capsule Take 1 capsule (25 mg total) by mouth daily. Take for 21 days, then none for 7 days. Repeat as ordered. [employer] Auth # [id number] Date Obtained 12/18/20 21 capsule 0   lidocaine-prilocaine (EMLA) cream Apply 1 application topically as needed. (Patient taking differently: Apply 1 application topically daily as needed (port access).) 30 g 0   metoprolol succinate (TOPROL-XL) 25 MG 24 hr tablet Take 25 mg by mouth Daily.  phenytoin (DILANTIN) 100 MG ER capsule Take 200 mg by mouth 2 (two) times daily.     polyethylene glycol (MIRALAX / GLYCOLAX) 17 g packet Take 17 g by mouth daily. 14 each 0   rosuvastatin (CRESTOR) 20 MG tablet Take 20 mg by mouth daily.     timolol (BETIMOL) 0.5 % ophthalmic solution Place 1 drop into both eyes 2 (two) times daily.     No current facility-administered medications for this visit.    REVIEW OF SYSTEMS:   Constitutional: ( - ) fevers, ( - )  chills , ( - ) night sweats Eyes: ( - ) blurriness of vision, ( - ) double vision, ( - ) watery eyes Ears, nose, mouth, throat, and face: ( - ) mucositis, ( - ) sore throat Respiratory: ( - ) cough, ( - ) dyspnea, ( - ) wheezes Cardiovascular: ( - ) palpitation, ( - ) chest discomfort, ( - ) lower extremity swelling Gastrointestinal:  ( - ) nausea, ( - ) heartburn, ( - ) change in bowel habits Skin: ( - ) abnormal skin rashes Lymphatics: ( - ) new lymphadenopathy, ( - ) easy bruising Neurological: ( - ) numbness, ( - ) tingling, ( - ) new weaknesses Behavioral/Psych: ( - ) mood change, ( - ) new changes  All other systems were reviewed with the patient and are negative.  PHYSICAL EXAMINATION: ECOG PERFORMANCE STATUS: 1 - Symptomatic but completely ambulatory  Vitals:   12/25/20 1426  BP: 133/66  Pulse: 67  Resp: 17  Temp:  98.4 F (36.9 C)  SpO2: 97%    Filed Weights   12/25/20 1426  Weight: 166 lb 11.2 oz (75.6 kg)     GENERAL: well appearing elderly Caucasian male in NAD  SKIN: skin color, texture, turgor are normal, no rashes or significant lesions EYES: conjunctiva are pink and non-injected, sclera clear NECK: supple, non-tender LYMPH:  Massive right sided submandibular lymph node. Size is increasing rapidly, large confluent ulcers have developed that drain. Otherwise no other marked palpable lymphadenopathy in the cervical, axillary or supraclavicular lymph nodes.  LUNGS: clear to auscultation and percussion with normal breathing effort HEART: regular rate & rhythm and no murmurs and no lower extremity edema Musculoskeletal: no cyanosis of digits and no clubbing  PSYCH: alert & oriented x 3, fluent speech NEURO: no focal motor/sensory deficits  LABORATORY DATA:  I have reviewed the data as listed CBC Latest Ref Rng & Units 12/30/2020 12/25/2020 12/17/2020  WBC 4.0 - 10.5 K/uL 4.0 6.5 11.0(H)  Hemoglobin 13.0 - 17.0 g/dL 10.9(L) 10.9(L) 11.4(L)  Hematocrit 39.0 - 52.0 % 32.2(L) 32.1(L) 33.9(L)  Platelets 150 - 400 K/uL 222 265 162    CMP Latest Ref Rng & Units 12/30/2020 12/25/2020 12/17/2020  Glucose 70 - 99 mg/dL 107(H) 158(H) 129(H)  BUN 8 - 23 mg/dL 12 16 12   Creatinine 0.61 - 1.24 mg/dL 0.69 0.73 0.62  Sodium 135 - 145 mmol/L 140 139 139  Potassium 3.5 - 5.1 mmol/L 4.1 4.2 3.8  Chloride 98 - 111 mmol/L 104 104 103  CO2 22 - 32 mmol/L 26 27 30   Calcium 8.9 - 10.3 mg/dL 9.4 9.1 8.8(L)  Total Protein 6.5 - 8.1 g/dL 6.8 7.1 6.4(L)  Total Bilirubin 0.3 - 1.2 mg/dL 0.3 <0.1(L) 0.2(L)  Alkaline Phos 38 - 126 U/L 111 88 101  AST 15 - 41 U/L 18 26 26   ALT 0 - 44 U/L 23 35 28   RADIOGRAPHIC STUDIES: I have  personally reviewed the radiological images as listed and agreed with the findings in the report: stable lymphadenopathy with exception of the rapidly enlarging submandibular lymph node.    DG Chest 2 View  Result Date: 12/10/2020 CLINICAL DATA:  Cough and fever EXAM: CHEST - 2 VIEW COMPARISON:  06/25/2020 FINDINGS: Coarsened lung markings over the lower lobes on the lateral view with scar-like appearance by prior chest CT February 2022. There is no edema, consolidation, effusion, or pneumothorax. Normal heart size and mediastinal contours. Unremarkable porta catheter positioning IMPRESSION: No evidence of active disease. Electronically Signed   By: Jorje Guild M.D.   On: 12/10/2020 05:18   DG Wrist Complete Left  Result Date: 12/05/2020 CLINICAL DATA:  Fall. EXAM: LEFT HAND - COMPLETE 3+ VIEW; LEFT WRIST - COMPLETE 3+ VIEW COMPARISON:  None. FINDINGS: No acute fracture or dislocation. Prior trapeziiectomy. Mild osteoarthritis of the DIP joints and thumb IP joint. Bone mineralization is normal. Soft tissues are unremarkable. IMPRESSION: 1. No acute osseous abnormality. Electronically Signed   By: Titus Dubin M.D.   On: 12/05/2020 18:19   CT Head Wo Contrast  Result Date: 12/05/2020 CLINICAL DATA:  Recent fall with left facial trauma and pain, initial encounter EXAM: CT HEAD WITHOUT CONTRAST CT MAXILLOFACIAL WITHOUT CONTRAST CT CERVICAL SPINE WITHOUT CONTRAST TECHNIQUE: Multidetector CT imaging of the head, cervical spine, and maxillofacial structures were performed using the standard protocol without intravenous contrast. Multiplanar CT image reconstructions of the cervical spine and maxillofacial structures were also generated. COMPARISON:  09/11/2020 FINDINGS: CT HEAD FINDINGS Brain: No evidence of acute infarction, hemorrhage, hydrocephalus, extra-axial collection or mass lesion/mass effect. Mild atrophic changes are noted. Vascular: No hyperdense vessel or unexpected calcification. Skull: Normal. Negative for fracture or focal lesion. Other: None. CT MAXILLOFACIAL FINDINGS Osseous: Mildly downward displaced distal nasal bone fracture is noted best seen on image number 48 of  series 9. This is noted centrally at the bridge of the nose. No other fractures are seen. No other bony abnormality is noted. Orbits: The orbits and their contents are within normal limits. No muscular abnormality is noted. Sinuses: Paranasal sinuses are well aerated. No mucosal abnormality is seen. Soft tissues: Surrounding soft tissue structures again show an enlarged soft tissue mass lesion similar to that seen on prior CT of the neck. It demonstrates changes of central necrosis as well as some air within which may be related to recent biopsy. Clinical correlation is recommended. This lesion now measures 6.3 x 4.8 cm which is increased from the prior exam at which time it measured 4.9 x 3.3 cm. Some surrounding soft tissue edema is noted. Some extension along the masseter muscle on the right is noted she was not appreciated on the prior exam. To soft tissue foci are seen. One of these measures approximately 15 mm and the second approximally 18 mm. Mild soft tissue swelling in the forehead is noted. CT CERVICAL SPINE FINDINGS Alignment: Within normal limits. Skull base and vertebrae: Cervical segments are well visualized. Vertebral body height is well maintained. Disc space narrowing is noted from C4-C7 with mild associated osteophytic changes. Multilevel facet hypertrophic changes are seen. No acute fracture or acute facet abnormality is noted. Neural foraminal narrowing is noted bilaterally. Soft tissues and spinal canal: Surrounding soft tissue structures again show a mass lesion in the right lateral neck increased in size from the prior study in June of 2022 with some central necrosis and air identified. Upper chest: Visualized lung apices are within normal limits. Other: None  IMPRESSION: CT of the head: Mild atrophic changes without acute intracranial abnormality. CT of the maxillofacial bones: Acute nasal bone fracture involving the bridge distally. No other bony abnormality is seen. Soft tissue mass lesion  in the right lateral neck similar to this seen on prior CT examination from June of 2022 although increased in size as described. Some additional soft tissue components extending along the masseter muscle on the right are noted new from the prior exam. This is consistent with the given clinical history of lymphoma. Mild left forehead hematoma is seen. CT of the cervical spine: Multilevel degenerative changes without acute abnormality. Electronically Signed   By: Inez Catalina M.D.   On: 12/05/2020 17:23   CT Soft Tissue Neck W Contrast  Result Date: 12/10/2020 CLINICAL DATA:  Lymphoma under treatment. Malaise and fever. Ulcerated right lateral neck, site of radiotherapy EXAM: CT NECK WITH CONTRAST TECHNIQUE: Multidetector CT imaging of the neck was performed using the standard protocol following the bolus administration of intravenous contrast. CONTRAST:  27m OMNIPAQUE IOHEXOL 350 MG/ML SOLN COMPARISON:  09/11/2020 FINDINGS: Pharynx and larynx: No evidence of mass or thickening of Waldeyer's ring. Palatine tonsilliths Salivary glands: Right submandibular mass involving the parotid tail and submandibular glands, progressed in size, and necrotic with central low-density, gas, and opening to the skin surface. The mass measures up to 7 cm in span. Increased solid tumor invasion into the right parotid and right anterior masseter. The mass is closely associated with the right facial artery, without visible pseudoaneurysm. Thyroid: Normal Lymph nodes: It is unclear if the right neck mass was initially a node. No other enlarged or heterogeneous nodes. Vascular: As above. Atheromatous calcifications and right IJ porta catheter. 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 Visualized orbits: Negative Mastoids and visualized paranasal sinuses: Clear Skeleton: Patient's mass is in continuity with the right mandibular body without asymmetric bone loss or erosion. Upper chest: 2 new  ground-glass nodules in the right upper lobe measuring 1 cm. IMPRESSION: 1. ~7 cm trans spatial right neck mass with marked size progression from June 2020 and extensive necrosis with evidence of draining to the skin. The right facial artery traverses the necrotic mass. 2. Two new ground-glass nodules in the right upper lobe which could be lymphomatous. Electronically Signed   By: JJorje GuildM.D.   On: 12/10/2020 07:39   CT Cervical Spine Wo Contrast  Result Date: 12/05/2020 CLINICAL DATA:  Recent fall with left facial trauma and pain, initial encounter EXAM: CT HEAD WITHOUT CONTRAST CT MAXILLOFACIAL WITHOUT CONTRAST CT CERVICAL SPINE WITHOUT CONTRAST TECHNIQUE: Multidetector CT imaging of the head, cervical spine, and maxillofacial structures were performed using the standard protocol without intravenous contrast. Multiplanar CT image reconstructions of the cervical spine and maxillofacial structures were also generated. COMPARISON:  09/11/2020 FINDINGS: CT HEAD FINDINGS Brain: No evidence of acute infarction, hemorrhage, hydrocephalus, extra-axial collection or mass lesion/mass effect. Mild atrophic changes are noted. Vascular: No hyperdense vessel or unexpected calcification. Skull: Normal. Negative for fracture or focal lesion. Other: None. CT MAXILLOFACIAL FINDINGS Osseous: Mildly downward displaced distal nasal bone fracture is noted best seen on image number 48 of series 9. This is noted centrally at the bridge of the nose. No other fractures are seen. No other bony abnormality is noted. Orbits: The orbits and their contents are within normal limits. No muscular abnormality is noted. Sinuses: Paranasal sinuses are well aerated. No mucosal abnormality is seen. Soft tissues: Surrounding soft tissue structures again show an  enlarged soft tissue mass lesion similar to that seen on prior CT of the neck. It demonstrates changes of central necrosis as well as some air within which may be related to recent  biopsy. Clinical correlation is recommended. This lesion now measures 6.3 x 4.8 cm which is increased from the prior exam at which time it measured 4.9 x 3.3 cm. Some surrounding soft tissue edema is noted. Some extension along the masseter muscle on the right is noted she was not appreciated on the prior exam. To soft tissue foci are seen. One of these measures approximately 15 mm and the second approximally 18 mm. Mild soft tissue swelling in the forehead is noted. CT CERVICAL SPINE FINDINGS Alignment: Within normal limits. Skull base and vertebrae: Cervical segments are well visualized. Vertebral body height is well maintained. Disc space narrowing is noted from C4-C7 with mild associated osteophytic changes. Multilevel facet hypertrophic changes are seen. No acute fracture or acute facet abnormality is noted. Neural foraminal narrowing is noted bilaterally. Soft tissues and spinal canal: Surrounding soft tissue structures again show a mass lesion in the right lateral neck increased in size from the prior study in June of 2022 with some central necrosis and air identified. Upper chest: Visualized lung apices are within normal limits. Other: None IMPRESSION: CT of the head: Mild atrophic changes without acute intracranial abnormality. CT of the maxillofacial bones: Acute nasal bone fracture involving the bridge distally. No other bony abnormality is seen. Soft tissue mass lesion in the right lateral neck similar to this seen on prior CT examination from June of 2022 although increased in size as described. Some additional soft tissue components extending along the masseter muscle on the right are noted new from the prior exam. This is consistent with the given clinical history of lymphoma. Mild left forehead hematoma is seen. CT of the cervical spine: Multilevel degenerative changes without acute abnormality. Electronically Signed   By: Inez Catalina M.D.   On: 12/05/2020 17:23   CT BIOPSY  Result Date:  12/17/2020 INDICATION: 80 year old male with history of lymphoma neutropenia. EXAM: CT-GUIDED BONE MARROW BIOPSY AND ASPIRATION MEDICATIONS: None ANESTHESIA/SEDATION: Fentanyl 100 mcg IV; Versed 2 mg IV Sedation Time: 10 minutes; The patient was continuously monitored during the procedure by the interventional radiology nurse under my direct supervision. COMPLICATIONS: None immediate. PROCEDURE: Informed consent was obtained from the patient following an explanation of the procedure, risks, benefits and alternatives. The patient understands, agrees and consents for the procedure. All questions were addressed. A time out was performed prior to the initiation of the procedure. The patient was positioned prone and non-contrast localization CT was performed of the pelvis to demonstrate the iliac marrow spaces. The operative site was prepped and draped in the usual sterile fashion. Under sterile conditions and local anesthesia, a 22 gauge spinal needle was utilized for procedural planning. Next, an 11 gauge coaxial bone biopsy needle was advanced into the right iliac marrow space. Needle position was confirmed with CT imaging. Initially, a bone marrow aspiration was performed. Next, a bone marrow biopsy was obtained with the 11 gauge outer bone marrow device. Samples were prepared with the cytotechnologist and deemed adequate. The needle was removed and superficial hemostasis was obtained with manual compression. A dressing was applied. The patient tolerated the procedure well without immediate post procedural complication. IMPRESSION: Successful CT guided right iliac bone marrow aspiration and core biopsy. Ruthann Cancer, MD [facility] Radiology Electronically Signed   By: Camillia Herter  Suttle M.D.   On: 12/17/2020 12:46   DG Knee Complete 4 Views Left  Result Date: 12/05/2020 CLINICAL DATA:  Fall. EXAM: LEFT KNEE - COMPLETE 4+ VIEW COMPARISON:  None. FINDINGS: No acute  fracture or dislocation. No joint effusion. Mild medial compartment joint space narrowing. Bone mineralization is normal. Soft tissues are unremarkable. IMPRESSION: 1. No acute osseous abnormality. 2. Mild medial compartment osteoarthritis. Electronically Signed   By: Titus Dubin M.D.   On: 12/05/2020 18:16   DG Hand Complete Left  Result Date: 12/05/2020 CLINICAL DATA:  Fall. EXAM: LEFT HAND - COMPLETE 3+ VIEW; LEFT WRIST - COMPLETE 3+ VIEW COMPARISON:  None. FINDINGS: No acute fracture or dislocation. Prior trapeziiectomy. Mild osteoarthritis of the DIP joints and thumb IP joint. Bone mineralization is normal. Soft tissues are unremarkable. IMPRESSION: 1. No acute osseous abnormality. Electronically Signed   By: Titus Dubin M.D.   On: 12/05/2020 18:19   CT BONE MARROW BIOPSY & ASPIRATION  Result Date: 12/17/2020 INDICATION: 80 year old male with history of lymphoma neutropenia. EXAM: CT-GUIDED BONE MARROW BIOPSY AND ASPIRATION MEDICATIONS: None ANESTHESIA/SEDATION: Fentanyl 100 mcg IV; Versed 2 mg IV Sedation Time: 10 minutes; The patient was continuously monitored during the procedure by the interventional radiology nurse under my direct supervision. COMPLICATIONS: None immediate. PROCEDURE: Informed consent was obtained from the patient following an explanation of the procedure, risks, benefits and alternatives. The patient understands, agrees and consents for the procedure. All questions were addressed. A time out was performed prior to the initiation of the procedure. The patient was positioned prone and non-contrast localization CT was performed of the pelvis to demonstrate the iliac marrow spaces. The operative site was prepped and draped in the usual sterile fashion. Under sterile conditions and local anesthesia, a 22 gauge spinal needle was utilized for procedural planning. Next, an 11 gauge coaxial bone biopsy needle was advanced into the right iliac marrow space. Needle position was  confirmed with CT imaging. Initially, a bone marrow aspiration was performed. Next, a bone marrow biopsy was obtained with the 11 gauge outer bone marrow device. Samples were prepared with the cytotechnologist and deemed adequate. The needle was removed and superficial hemostasis was obtained with manual compression. A dressing was applied. The patient tolerated the procedure well without immediate post procedural complication. IMPRESSION: Successful CT guided right iliac bone marrow aspiration and core biopsy. Ruthann Cancer, MD [facility] Radiology Electronically Signed   By: Ruthann Cancer M.D.   On: 12/17/2020 12:46   [place] CORE BIOPSY (SALIVARY GLAND/PAROTID GLAND)  Result Date: 12/06/2020 INDICATION: 80 year old with a large right neck mass and follicular lymphoma. Patient has new lesions on the right side of the face. EXAM: Ultrasound-guided core biopsy of right parotid gland lesion MEDICATIONS: Moderate sedation ANESTHESIA/SEDATION: Moderate (conscious) sedation was employed during this procedure. A total of Versed 1.0 mg and Fentanyl 50 mcg was administered intravenously. Moderate Sedation Time: 15 minutes. The patient's level of consciousness and vital signs were monitored continuously by radiology nursing throughout the procedure under my direct supervision. FLUOROSCOPY TIME:  None COMPLICATIONS: None immediate. PROCEDURE: Informed written consent was obtained from the patient after a thorough discussion of the procedural risks, benefits and alternatives. All questions were addressed. A timeout was performed prior to the initiation of the procedure. Patient has visible and palpable lesions on the right side of the face above the right neck mass. This area was evaluated with ultrasound. Two nodules in this area correspond with the recent CT imaging.  Right side of the face was prepped with chlorhexidine and sterile field was created. Skin and soft tissues were  anesthetized using 1% lidocaine. A tiny incision was made on the right side of the neck. Using ultrasound guidance, a total of 5 core biopsies were obtained with an 18 gauge [place] device in the right parotid lesion. Specimens placed in saline. Bandage placed over the puncture site. FINDINGS: Large mass in the right upper neck. There are 2 visible nodules on the right side of the face anterior to the right ear. These correspond with nodules on the recent CT imaging. There is 1 nodule within the anterior right parotid gland that measures 1.5 x 0.9 x 1.5 cm. This nodule was biopsied. There is a larger nodule anterior to the right parotid nodule that is more hypoechoic and heterogeneous. This nodule is involving the right masseter muscle based on the CT and is contiguous with the larger right neck mass based on sonography. Morphology of these new right facial nodules are similar to the larger right neck mass. IMPRESSION: Ultrasound-guided core biopsies of the right parotid lesion. Electronically Signed   By: Markus Daft M.D.   On: 12/06/2020 15:35   CT Maxillofacial WO CM  Result Date: 12/05/2020 CLINICAL DATA:  Recent fall with left facial trauma and pain, initial encounter EXAM: CT HEAD WITHOUT CONTRAST CT MAXILLOFACIAL WITHOUT CONTRAST CT CERVICAL SPINE WITHOUT CONTRAST TECHNIQUE: Multidetector CT imaging of the head, cervical spine, and maxillofacial structures were performed using the standard protocol without intravenous contrast. Multiplanar CT image reconstructions of the cervical spine and maxillofacial structures were also generated. COMPARISON:  09/11/2020 FINDINGS: CT HEAD FINDINGS Brain: No evidence of acute infarction, hemorrhage, hydrocephalus, extra-axial collection or mass lesion/mass effect. Mild atrophic changes are noted. Vascular: No hyperdense vessel or unexpected calcification. Skull: Normal. Negative for fracture or focal lesion. Other: None. CT MAXILLOFACIAL FINDINGS Osseous: Mildly  downward displaced distal nasal bone fracture is noted best seen on image number 48 of series 9. This is noted centrally at the bridge of the nose. No other fractures are seen. No other bony abnormality is noted. Orbits: The orbits and their contents are within normal limits. No muscular abnormality is noted. Sinuses: Paranasal sinuses are well aerated. No mucosal abnormality is seen. Soft tissues: Surrounding soft tissue structures again show an enlarged soft tissue mass lesion similar to that seen on prior CT of the neck. It demonstrates changes of central necrosis as well as some air within which may be related to recent biopsy. Clinical correlation is recommended. This lesion now measures 6.3 x 4.8 cm which is increased from the prior exam at which time it measured 4.9 x 3.3 cm. Some surrounding soft tissue edema is noted. Some extension along the masseter muscle on the right is noted she was not appreciated on the prior exam. To soft tissue foci are seen. One of these measures approximately 15 mm and the second approximally 18 mm. Mild soft tissue swelling in the forehead is noted. CT CERVICAL SPINE FINDINGS Alignment: Within normal limits. Skull base and vertebrae: Cervical segments are well visualized. Vertebral body height is well maintained. Disc space narrowing is noted from C4-C7 with mild associated osteophytic changes. Multilevel facet hypertrophic changes are seen. No acute fracture or acute facet abnormality is noted. Neural foraminal narrowing is noted bilaterally. Soft tissues and spinal canal: Surrounding soft tissue structures again show a mass lesion in the right lateral neck increased in size from the prior study in June of 2022  with some central necrosis and air identified. Upper chest: Visualized lung apices are within normal limits. Other: None IMPRESSION: CT of the head: Mild atrophic changes without acute intracranial abnormality. CT of the maxillofacial bones: Acute nasal bone fracture  involving the bridge distally. No other bony abnormality is seen. Soft tissue mass lesion in the right lateral neck similar to this seen on prior CT examination from June of 2022 although increased in size as described. Some additional soft tissue components extending along the masseter muscle on the right are noted new from the prior exam. This is consistent with the given clinical history of lymphoma. Mild left forehead hematoma is seen. CT of the cervical spine: Multilevel degenerative changes without acute abnormality. Electronically Signed   By: Inez Catalina M.D.   On: 12/05/2020 17:23    ASSESSMENT & PLAN Brian Castillo 80 y.o. 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 The patient's last visit was on 02/12/2020 at which time he establish care.  After review the labs, the records, discussion with the patient the findings are most consistent with a stage III follicular lymphoma based on the PET CT scan.  There are lymph nodes on both sides of diaphragm and therefore stage III would be the most appropriate designation.    Previously we discussed the nature of follicular lymphoma and the treatment options moving forward.  The options at this time would be observation, immunotherapy, or combination of chemotherapy and immunotherapy.  Given the lack of symptoms, cytopenias, and bulky disease at the time of presentation we decided to proceed with observation alone. Unfortunately due to rapid progression we need to consider treatment moving forward.  Additionally we discussed rituximab monotherapy with weekly dosing x4 doses followed by every 2 months dosing x4 as a treatment option as well as bendamustine and rituximab on days 1 and 2 of a 28-day cycle x6.  We discussed the side effects, risks and benefits, and possible outcomes of treatment.  The patient voiced his understanding of these options and wished to proceed with R-Benda.   Over the  period of one month the patient was found to have a rapidly enlarging right submandibular lymph node.  Due to concern for this enlarging lymph node we ordered a CT scan of the chest abdomen pelvis to assess for progression elsewhere, but that was the only lymph node that was progressing. 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   On PET CT scan from 08/23/2020 the patient showed complete response of disease with every site with the exception of his right cervical lymph node which is increasing in size and brightness.  As such we have to discontinue first-line therapy with rituximab and Bendamustine and transition over to second line R mini CHOP.  R mini CHOP chemotherapy Cycle 1 Day1 started on 08/28/2020.  Unfortunately when he came time to proceed to cycle 3 it was evident that the lymph node in the neck had started to grow again rapidly.  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  We are currently planning for 2nd line therapy with R-miniCHOP(which would also be effective if patient is having transformation of the tumor).  -- patient completed PET, Port, and TTE prior to start of R-miniCHOP. --prior PET CT findings are consistent with a Stage III follicular lymphoma. Due  to rapidly enlarging submandibular lymph node we proceeded with treatment.  --imaging shows stable disease save for a single enlarging submandibular lymph node.  Prior biopsy has shown transformation into a more aggressive lymphoma.  --started Cycle 1 Day 1 of R-Benda chemotherapy on 06/12/2020 --completed Cycle 3 Day of R-Benda. Unfortunately he had progression for which we changed his therapy to R-miniCHOP.  --08/28/2020 was Day 1 of Cycle 1 of R-miniCHOP. Prior to start of Cycle 3 Day 1 he was noted to have increased size of the lymph node.  Plan: -- plan to proceed  with Monjuvi/Revlimid on 12/30/2020 --RTC for Day 1 and 8 of treatment.   #Neutropenia, improved -- Neutropenia resolved at the end of radiation therapy and with G-CSF therapy --ANC currently greater than 1.5 --Continue to monitor closely while on Monjuvi/Revlimid  #Pain Control, improved --patient is having severe pain in his midline neck up to his scalp --extensive workup including CT/MRI scans of head/neck and LP have revealed no clear etiology for his findings --pain is responding well to gabapentin 300 BID with flexeril and dilaudid at night.  --oxycodone to 15 mg PO q 4H PRN with ibuprofen 677m Q6H PRN. (Not currently taking this) --Dilaudid 2 mg p.o. at night, will refill today.  --Continue to monitor  #Medication Interaction --Allopurinol increases the levels of Dilantin. --We will work with the patient's primary care provider Dr. JShon Batonto monitor his Dilantin levels and adjust accordingly. For now we are following this weekly.  #Fever, resolved --Single episode of fever up to 101 F occurred on 06/25/2020. --Patient was not neutropenic at that time and was evaluated emergency department with infectious work-up including chest x-ray, blood cultures, and urine culture.  Data at that time showed no evidence of infectious disease. --No further fevers noted since that time.  We will continue to monitor.  #Supportive Care --chemotherapy education completed --zofran [id number] PRN and compazine 1065mO q6H for nausea --acyclovir 400m28m BID for VCZ prophylaxis -- allopurinol 300mg60mdaily for TLS prophylaxis -- port in place -- no pain medication required at this time.   No orders of the defined types were placed in this encounter.  All questions were answered. The patient knows to call the clinic with any problems, questions or concerns.  A total of more than 30 minutes were spent on this encounter and over half of that time was spent on counseling and coordination of care  as outlined above.   Tyler Cubit Ledell PeoplesDepartment of Hematology/Oncology [facility]: [phone]: [phone]: [email or web address]  12/31/2020 1:38 PM

## 2021-01-01 MED FILL — Dexamethasone Sodium Phosphate Inj 100 MG/10ML: INTRAMUSCULAR | Qty: 2 | Status: AC

## 2021-01-02 ENCOUNTER — Other Ambulatory Visit: Payer: Self-pay

## 2021-01-02 ENCOUNTER — Inpatient Hospital Stay: Payer: Medicare Other

## 2021-01-02 VITALS — BP 144/64 | HR 75 | Temp 98.3°F | Resp 14 | Wt 167.0 lb

## 2021-01-02 DIAGNOSIS — C8251 Diffuse follicle center lymphoma, lymph nodes of head, face, and neck: Secondary | ICD-10-CM

## 2021-01-02 DIAGNOSIS — C8511 Unspecified B-cell lymphoma, lymph nodes of head, face, and neck: Secondary | ICD-10-CM | POA: Diagnosis not present

## 2021-01-02 MED ORDER — FAMOTIDINE 20 MG IN NS 100 ML IVPB
20.0000 mg | Freq: Once | INTRAVENOUS | Status: AC
  Administered 2021-01-02: 20 mg via INTRAVENOUS
  Filled 2021-01-02: qty 100

## 2021-01-02 MED ORDER — SODIUM CHLORIDE 0.9 % IV SOLN: Freq: Once | INTRAVENOUS | Status: AC

## 2021-01-02 MED ORDER — SODIUM CHLORIDE 0.9 % IV SOLN
20.0000 mg | Freq: Once | INTRAVENOUS | Status: AC
  Administered 2021-01-02: 20 mg via INTRAVENOUS
  Filled 2021-01-02: qty 20

## 2021-01-02 MED ORDER — DIPHENHYDRAMINE HCL 50 MG/ML IJ SOLN
50.0000 mg | Freq: Once | INTRAMUSCULAR | Status: AC
  Administered 2021-01-02: 50 mg via INTRAVENOUS
  Filled 2021-01-02: qty 1

## 2021-01-02 MED ORDER — ACETAMINOPHEN 325 MG PO TABS
650.0000 mg | ORAL_TABLET | Freq: Once | ORAL | Status: AC
  Administered 2021-01-02: 650 mg via ORAL
  Filled 2021-01-02: qty 2

## 2021-01-02 MED ORDER — TAFASITAMAB-CXIX CHEMO INJECTION 200 MG
12.0000 mg/kg | Freq: Once | INTRAVENOUS | Status: AC
  Administered 2021-01-02: 1000 mg via INTRAVENOUS
  Filled 2021-01-02: qty 25

## 2021-01-02 MED ORDER — SODIUM CHLORIDE 0.9% FLUSH
10.0000 mL | INTRAVENOUS | Status: DC | PRN
  Administered 2021-01-02: 10 mL

## 2021-01-02 MED ORDER — HEPARIN SOD (PORK) LOCK FLUSH 100 UNIT/ML IV SOLN
500.0000 [IU] | Freq: Once | INTRAVENOUS | Status: AC | PRN
  Administered 2021-01-02: 500 [IU]

## 2021-01-02 NOTE — Progress Notes (Signed)
Mr. Nieto presents today for 2 week follow-up after completing radiation (quad shot) to his right neck mass on 12/18/2020  Pain issues, if any: states he has pain 1/10 in right neck area Using a feeding tube?: N/A Weight changes, if any: BP (!) 127/57 (BP Location: Right Arm, Patient Position: Sitting)   Pulse 67   Ht 5\' 8"  (1.727 m)   Wt 168 lb 3.2 oz (76.3 kg)   SpO2 98%   BMI 25.57 kg/m   Swallowing issues, if any: none Smoking or chewing tobacco? none Using fluoride trays daily? none Last ENT visit was on: 10/29/2020 Saw Dr. Melida Quitter to assess wound drainage Other notable issues, if any: started chemo Last systemic infusion 12/30/2020 Patient thinks he is doing okay except for the draining lymph node. He remains very fatigue. States he is eating okay and drinking okay.

## 2021-01-02 NOTE — Patient Instructions (Signed)
[facility] ONCOLOGY  Discharge Instructions: Thank you for choosing [facility] to provide your oncology and hematology care.   If you have a lab appointment with the [facility], please go directly to the [facility] and check in at the registration area.   Wear comfortable clothing and clothing appropriate for easy access to any Portacath or PICC line.   We strive to give you quality time with your provider. You may need to reschedule your appointment if you arrive late (15 or more minutes).  Arriving late affects you and other patients whose appointments are after yours.  Also, if you miss three or more appointments without notifying the office, you may be dismissed from the clinic at the provider's discretion.      For prescription refill requests, have your pharmacy contact our office and allow 72 hours for refills to be completed.    Today you received the following chemotherapy and/or immunotherapy agents Monjuvi      To help prevent nausea and vomiting after your treatment, we encourage you to take your nausea medication as directed.  BELOW ARE SYMPTOMS THAT SHOULD BE REPORTED IMMEDIATELY: *FEVER GREATER THAN 100.4 F (38 C) OR HIGHER *CHILLS OR SWEATING *NAUSEA AND VOMITING THAT IS NOT CONTROLLED WITH YOUR NAUSEA MEDICATION *UNUSUAL SHORTNESS OF BREATH *UNUSUAL BRUISING OR BLEEDING *URINARY PROBLEMS (pain or burning when urinating, or frequent urination) *BOWEL PROBLEMS (unusual diarrhea, constipation, pain near the anus) TENDERNESS IN MOUTH AND THROAT WITH OR WITHOUT PRESENCE OF ULCERS (sore throat, sores in mouth, or a toothache) UNUSUAL RASH, SWELLING OR PAIN  UNUSUAL VAGINAL DISCHARGE OR ITCHING   Items with * indicate a potential emergency and should be followed up as soon as possible or go to the Emergency Department if any problems should occur.  Please show the CHEMOTHERAPY ALERT CARD or IMMUNOTHERAPY ALERT CARD at check-in to the  Emergency Department and triage nurse.  Should you have questions after your visit or need to cancel or reschedule your appointment, please contact [facility]  Dept: [phone]  and follow the prompts.  Office hours are 8:00 a.m. to 4:30 p.m. Monday - Friday. Please note that voicemails left after 4:00 p.m. may not be returned until the following business day.  We are closed weekends and major holidays. You have access to a nurse at all times for urgent questions. Please call the main number to the clinic Dept: [phone] and follow the prompts.   For any non-urgent questions, you may also contact your provider using MyChart. We now offer e-Visits for anyone 32 and older to request care online for non-urgent symptoms. For details visit [email or web address].   Also download the MyChart app! Go to the app store, search "MyChart", open the app, select [facility], and log in with your MyChart username and password.  Due to Covid, a mask is required upon entering the hospital/clinic. If you do not have a mask, one will be given to you upon arrival. For doctor visits, patients may have 1 support person aged 65 or older with them. For treatment visits, patients cannot have anyone with them due to current Covid guidelines and our immunocompromised population.

## 2021-01-03 ENCOUNTER — Encounter: Payer: Self-pay | Admitting: Radiation Oncology

## 2021-01-03 ENCOUNTER — Ambulatory Visit
Admission: RE | Admit: 2021-01-03 | Discharge: 2021-01-03 | Disposition: A | Discharge status: Home / Self Care | Payer: Medicare Other | Source: Ambulatory Visit | Attending: Radiation Oncology | Admitting: Radiation Oncology

## 2021-01-03 VITALS — BP 127/57 | HR 67 | Ht 68.0 in | Wt 168.2 lb

## 2021-01-03 DIAGNOSIS — R918 Other nonspecific abnormal finding of lung field: Secondary | ICD-10-CM | POA: Diagnosis not present

## 2021-01-03 DIAGNOSIS — C8251 Diffuse follicle center lymphoma, lymph nodes of head, face, and neck: Secondary | ICD-10-CM | POA: Insufficient documentation

## 2021-01-03 DIAGNOSIS — R5383 Other fatigue: Secondary | ICD-10-CM | POA: Insufficient documentation

## 2021-01-03 DIAGNOSIS — Z923 Personal history of irradiation: Secondary | ICD-10-CM | POA: Diagnosis not present

## 2021-01-03 DIAGNOSIS — Z7982 Long term (current) use of aspirin: Secondary | ICD-10-CM | POA: Insufficient documentation

## 2021-01-03 DIAGNOSIS — R5381 Other malaise: Secondary | ICD-10-CM | POA: Diagnosis not present

## 2021-01-03 MED FILL — Dexamethasone Sodium Phosphate Inj 100 MG/10ML: INTRAMUSCULAR | Qty: 2 | Status: AC

## 2021-01-03 NOTE — Progress Notes (Signed)
Oncology Nurse Navigator Documentation   I met with Mr. Born and his wife before his scheduled follow up with Dr. Isidore Moos today. He is following closely with Dr. Lorenso Courier. They deny any needs at this time but know to call me if they have any concerns or questions that I can help them with.   Harlow Asa RN, BSN, OCN Head & Neck Oncology Nurse [facility] at [facility] Phone # [phone]  Fax # [phone]

## 2021-01-04 ENCOUNTER — Other Ambulatory Visit: Payer: Self-pay

## 2021-01-04 ENCOUNTER — Emergency Department (HOSPITAL_BASED_OUTPATIENT_CLINIC_OR_DEPARTMENT_OTHER)
Admission: EM | Admit: 2021-01-04 | Discharge: 2021-01-04 | Disposition: A | Discharge status: Home / Self Care | Payer: Medicare Other | Attending: Emergency Medicine | Admitting: Emergency Medicine

## 2021-01-04 ENCOUNTER — Encounter (HOSPITAL_BASED_OUTPATIENT_CLINIC_OR_DEPARTMENT_OTHER): Payer: Self-pay | Admitting: Emergency Medicine

## 2021-01-04 DIAGNOSIS — Z79899 Other long term (current) drug therapy: Secondary | ICD-10-CM | POA: Insufficient documentation

## 2021-01-04 DIAGNOSIS — Z7982 Long term (current) use of aspirin: Secondary | ICD-10-CM | POA: Diagnosis not present

## 2021-01-04 DIAGNOSIS — Z87891 Personal history of nicotine dependence: Secondary | ICD-10-CM | POA: Insufficient documentation

## 2021-01-04 DIAGNOSIS — I10 Essential (primary) hypertension: Secondary | ICD-10-CM | POA: Diagnosis not present

## 2021-01-04 DIAGNOSIS — C8301 Small cell B-cell lymphoma, lymph nodes of head, face, and neck: Secondary | ICD-10-CM | POA: Insufficient documentation

## 2021-01-04 DIAGNOSIS — R220 Localized swelling, mass and lump, head: Secondary | ICD-10-CM | POA: Diagnosis present

## 2021-01-04 DIAGNOSIS — Z859 Personal history of malignant neoplasm, unspecified: Secondary | ICD-10-CM | POA: Diagnosis not present

## 2021-01-04 LAB — BASIC METABOLIC PANEL
Anion gap: 10 (ref 5–15)
BUN: 9 mg/dL (ref 8–23)
CO2: 28 mmol/L (ref 22–32)
Calcium: 8.9 mg/dL (ref 8.9–10.3)
Chloride: 101 mmol/L (ref 98–111)
Creatinine, Ser: 0.54 mg/dL — ABNORMAL LOW (ref 0.61–1.24)
GFR, Estimated: >60 mL/min (ref >60)
Glucose, Bld: 108 mg/dL — ABNORMAL HIGH (ref 70–99)
Potassium: 3.3 mmol/L — ABNORMAL LOW (ref 3.5–5.1)
Sodium: 139 mmol/L (ref 135–145)

## 2021-01-04 LAB — CBC WITH DIFFERENTIAL/PLATELET
Abs Immature Granulocytes: 0.02 10*3/uL (ref 0.00–0.07)
Basophils Absolute: 0 10*3/uL (ref 0.0–0.1)
Basophils Relative: 0 %
Eosinophils Absolute: 0.7 10*3/uL — ABNORMAL HIGH (ref 0.0–0.5)
Eosinophils Relative: 14 %
HCT: 33 % — ABNORMAL LOW (ref 39.0–52.0)
Hemoglobin: 11.2 g/dL — ABNORMAL LOW (ref 13.0–17.0)
Immature Granulocytes: 0 %
Lymphocytes Relative: 5 %
Lymphs Abs: 0.2 10*3/uL — ABNORMAL LOW (ref 0.7–4.0)
MCH: 31.5 pg (ref 26.0–34.0)
MCHC: 33.9 g/dL (ref 30.0–36.0)
MCV: 93 fL (ref 80.0–100.0)
Monocytes Absolute: 0.4 10*3/uL (ref 0.1–1.0)
Monocytes Relative: 9 %
Neutro Abs: 3.2 10*3/uL (ref 1.7–7.7)
Neutrophils Relative %: 72 %
Platelets: 196 10*3/uL (ref 150–400)
RBC: 3.55 MIL/uL — ABNORMAL LOW (ref 4.22–5.81)
RDW: 13.5 % (ref 11.5–15.5)
WBC: 4.5 10*3/uL (ref 4.0–10.5)
nRBC: 0 % (ref 0.0–0.2)

## 2021-01-04 MED ORDER — DOXYCYCLINE HYCLATE 100 MG PO CAPS: 100.0000 mg | ORAL_CAPSULE | Freq: Two times a day (BID) | ORAL | 0 refills | Status: DC

## 2021-01-04 MED ORDER — DOXYCYCLINE HYCLATE 100 MG PO TABS
100.0000 mg | ORAL_TABLET | Freq: Once | ORAL | Status: AC
  Administered 2021-01-04: 100 mg via ORAL
  Filled 2021-01-04: qty 1

## 2021-01-04 MED ORDER — HEPARIN SOD (PORK) LOCK FLUSH 100 UNIT/ML IV SOLN
500.0000 [IU] | Freq: Once | INTRAVENOUS | Status: AC
  Administered 2021-01-04: 500 [IU]
  Filled 2021-01-04: qty 5

## 2021-01-04 NOTE — ED Triage Notes (Addendum)
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  Today, pt noticed an odor coming from it.

## 2021-01-04 NOTE — ED Provider Notes (Signed)
[facility] Provider Note  CSN: [id number] Arrival date & time: 01/04/21 1344    History Chief Complaint  Patient presents with   Abscess    Brian Castillo is a 80 y.o. male with history of B-cell lymphoma with a large fungating mass on his R face that has been unresponsive to chemo recently began a new regimen. The mass has been draining clear yellow liquid for quite sometime but today began to have any odor. He has not had a fever. No significant pain. He has surrounding erythema that is chronic and attributed to prior radiation treatment. He was previously neutropenic but last blood work showed good counts.    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   Seizures ([place])    last seizure was in 1988    Past Surgical History:  Procedure Laterality Date   CARPOMETACARPEL SUSPENSION PLASTY Right 08/24/2014   Procedure: RIGHT CMC ARTHOPLASTY WITH DOUBLE TENDON TRANSFER AND REPAIR  RECONSTRUCTION ;  Surgeon: Roseanne Kaufman, MD;  Location: [facility];  Service: Orthopedics;  Laterality: Right;   CHOLECYSTECTOMY     COLONOSCOPY     IR IMAGING GUIDED PORT INSERTION  08/14/2020   IR RADIOLOGIST EVAL & MGMT  08/20/2020   KNEE ARTHROSCOPY  2003   bilateral   POLYPECTOMY      Family History  Problem Relation Age of Onset   Colon cancer Neg Hx    Colon polyps Neg Hx     Social History   Tobacco Use   Smoking status: Former   Smokeless tobacco: Never   Tobacco comments:    quit 1984  Vaping Use   Vaping Use: Never used  Substance Use Topics   Alcohol use: Yes    Alcohol/week: 6.0 standard drinks    Types: 6 Glasses of wine per week   Drug use: No     Home  Medications Prior to Admission medications   Medication Sig Start Date End Date Taking? Authorizing Provider  doxycycline (VIBRAMYCIN) 100 MG capsule Take 1 capsule (100 mg total) by mouth 2 (two) times daily. 01/04/21  Yes Truddie Hidden, MD  acetaminophen (TYLENOL) 325 MG tablet Take 2 tablets (650 mg total) by mouth every 4 (four) hours as needed for mild pain, moderate pain, fever or headache (or Fever >/= 101). 12/19/20   Arrien, Jimmy Picket, MD  allopurinol (ZYLOPRIM) 300 MG tablet TAKE 1 TABLET BY MOUTH EVERY DAY 12/13/20   Orson Slick, MD  amLODipine (NORVASC) 5 MG tablet Take 5 mg by mouth Daily.  11/28/11   [provider]  aspirin 81 MG tablet Take 81 mg by mouth daily. Patient not taking: Reported on 01/03/2021    [provider]  Calcium Carb-Cholecalciferol (CALCIUM 600 + D PO) Take 1 tablet by mouth daily.    [provider]  cholecalciferol (VITAMIN D) 1000 units tablet Take 1,000 Units by mouth daily.    [provider]  gabapentin (NEURONTIN) 300 MG capsule Take 2 capsules (600 mg total) by mouth 3 (three) times daily. Take 1 capsule (300 mg total)  by mouth twice a day and 2 capsules (600mg ) at bedtime Patient taking differently: Take 600 mg by mouth 3 (three) times daily. 11/05/20   Ventura Sellers, MD  HYDROmorphone (DILAUDID) 2 MG tablet Take 1 tablet (2 mg total) by mouth every 4 (four) hours as needed for severe pain. 12/30/20   Orson Slick, MD  Latanoprostene Bunod (VYZULTA) 0.024 % SOLN Place 1 drop into both eyes at bedtime.    [provider]  lenalidomide (REVLIMID) 25 MG capsule Take 1 capsule (25 mg total) by mouth daily. Take for 21 days, then none for 7 days. Repeat as ordered. [facility] # [id number]; Date Obtained 12/18/20 12/19/20   Orson Slick, MD  lidocaine-prilocaine (EMLA) cream Apply 1 application topically as needed. Patient taking differently: Apply 1 application topically daily as needed  (port access). 08/28/20   Orson Slick, MD  metoprolol succinate (TOPROL-XL) 25 MG 24 hr tablet Take 25 mg by mouth Daily.  11/28/11   [provider]  phenytoin (DILANTIN) 100 MG ER capsule Take 200 mg by mouth 2 (two) times daily.    [provider]  polyethylene glycol (MIRALAX / GLYCOLAX) 17 g packet Take 17 g by mouth daily. 12/19/20   Arrien, Jimmy Picket, MD  rosuvastatin (CRESTOR) 20 MG tablet Take 20 mg by mouth daily.    [provider]  timolol (BETIMOL) 0.5 % ophthalmic solution Place 1 drop into both eyes 2 (two) times daily.    [provider]     Allergies    Brimonidine tartrate-timolol, Lisinopril, and Netarsudil dimesylate   Review of Systems   Review of Systems A comprehensive review of systems was completed and negative except as noted in HPI.    Physical Exam BP 134/69   Pulse 71   Temp 98.4 F (36.9 C)   Resp 18   SpO2 98%   Physical Exam Vitals and nursing note reviewed.  Constitutional:      Appearance: Normal appearance.  HENT:     Head: Normocephalic and atraumatic.     Comments: Large fungating mass on R face with some central necrotic tissue and drainage of clear/yellow fluid. No significant purulent drainage or odor on my exam.     Nose: Nose normal.     Mouth/Throat:     Mouth: Mucous membranes are moist.  Eyes:     Extraocular Movements: Extraocular movements intact.     Conjunctiva/sclera: Conjunctivae normal.  Cardiovascular:     Rate and Rhythm: Normal rate.  Pulmonary:     Effort: Pulmonary effort is normal.     Breath sounds: Normal breath sounds.  Abdominal:     General: Abdomen is flat.     Palpations: Abdomen is soft.     Tenderness: There is no abdominal tenderness.  Musculoskeletal:        General: No swelling. Normal range of motion.     Cervical back: Neck supple.  Skin:    General: Skin is warm and dry.  Neurological:     General: No focal deficit present.     Mental Status: He  is alert.  Psychiatric:        Mood and Affect: Mood normal.     ED Results / Procedures / Treatments   Labs (all labs ordered are listed, but only abnormal results are displayed) Labs Reviewed  BASIC METABOLIC PANEL - Abnormal; Notable for the following components:      Result Value   Potassium  3.3 (*)    Glucose, Bld 108 (*)    Creatinine, Ser 0.54 (*)    All other components within normal limits  CBC WITH DIFFERENTIAL/PLATELET - Abnormal; Notable for the following components:   RBC 3.55 (*)    Hemoglobin 11.2 (*)    HCT 33.0 (*)    Lymphs Abs 0.2 (*)    Eosinophils Absolute 0.7 (*)    All other components within normal limits    EKG None   Radiology No results found.  Procedures Procedures  Medications Ordered in the ED Medications  doxycycline (VIBRA-TABS) tablet 100 mg (has no administration in time range)     MDM Rules/Calculators/A&P MDM  Will check basic labs to ensure not neutropenic. Given reported odor will likely trial a course of oral antibiotics if not neutropenic until he can be seen in follow up.  ED Course  I have reviewed the triage vital signs and the nursing notes.  Pertinent labs & imaging results that were available during my care of the patient were reviewed by me and considered in my medical decision making (see chart for details).  Clinical Course as of 01/04/21 1759  Sat Jan 04, 2021  1700 CBC shows normal neutrophil count.  [CS]  1610 BMP is normal. Patient well appearing otherwise. Will begin oral Abx, he has follow up with Oncology already scheduled for Monday. RTED for any worsening drainage, fever, pain or other concerns.  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

## 2021-01-04 NOTE — ED Notes (Signed)
Dc instructions reviewed with patient, patient voiced understanding.

## 2021-01-06 ENCOUNTER — Inpatient Hospital Stay (HOSPITAL_BASED_OUTPATIENT_CLINIC_OR_DEPARTMENT_OTHER): Payer: Medicare Other | Admitting: Hematology and Oncology

## 2021-01-06 ENCOUNTER — Other Ambulatory Visit: Payer: Self-pay

## 2021-01-06 ENCOUNTER — Inpatient Hospital Stay: Payer: Medicare Other

## 2021-01-06 VITALS — BP 140/57 | HR 73 | Temp 97.6°F | Resp 17 | Wt 165.2 lb

## 2021-01-06 DIAGNOSIS — C8331 Diffuse large B-cell lymphoma, lymph nodes of head, face, and neck: Secondary | ICD-10-CM

## 2021-01-06 DIAGNOSIS — Z95828 Presence of other vascular implants and grafts: Secondary | ICD-10-CM

## 2021-01-06 DIAGNOSIS — C8251 Diffuse follicle center lymphoma, lymph nodes of head, face, and neck: Secondary | ICD-10-CM

## 2021-01-06 DIAGNOSIS — C8511 Unspecified B-cell lymphoma, lymph nodes of head, face, and neck: Secondary | ICD-10-CM | POA: Diagnosis not present

## 2021-01-06 LAB — CBC WITH DIFFERENTIAL (CANCER CENTER ONLY)
Abs Immature Granulocytes: 0.01 10*3/uL (ref 0.00–0.07)
Basophils Absolute: 0 10*3/uL (ref 0.0–0.1)
Basophils Relative: 0 %
Eosinophils Absolute: 0.5 10*3/uL (ref 0.0–0.5)
Eosinophils Relative: 10 %
HCT: 32.1 % — ABNORMAL LOW (ref 39.0–52.0)
Hemoglobin: 11 g/dL — ABNORMAL LOW (ref 13.0–17.0)
Immature Granulocytes: 0 %
Lymphocytes Relative: 4 %
Lymphs Abs: 0.2 10*3/uL — ABNORMAL LOW (ref 0.7–4.0)
MCH: 31.5 pg (ref 26.0–34.0)
MCHC: 34.3 g/dL (ref 30.0–36.0)
MCV: 92 fL (ref 80.0–100.0)
Monocytes Absolute: 0.4 10*3/uL (ref 0.1–1.0)
Monocytes Relative: 8 %
Neutro Abs: 4.1 10*3/uL (ref 1.7–7.7)
Neutrophils Relative %: 78 %
Platelet Count: 188 10*3/uL (ref 150–400)
RBC: 3.49 MIL/uL — ABNORMAL LOW (ref 4.22–5.81)
RDW: 13.4 % (ref 11.5–15.5)
WBC Count: 5.4 10*3/uL (ref 4.0–10.5)
nRBC: 0 % (ref 0.0–0.2)

## 2021-01-06 LAB — CMP (CANCER CENTER ONLY)
ALT: 21 U/L (ref 0–44)
AST: 15 U/L (ref 15–41)
Albumin: 3.2 g/dL — ABNORMAL LOW (ref 3.5–5.0)
Alkaline Phosphatase: 84 U/L (ref 38–126)
Anion gap: 7 (ref 5–15)
BUN: 13 mg/dL (ref 8–23)
CO2: 29 mmol/L (ref 22–32)
Calcium: 8.7 mg/dL — ABNORMAL LOW (ref 8.9–10.3)
Chloride: 102 mmol/L (ref 98–111)
Creatinine: 0.63 mg/dL (ref 0.61–1.24)
GFR, Estimated: >60 mL/min (ref >60)
Glucose, Bld: 136 mg/dL — ABNORMAL HIGH (ref 70–99)
Potassium: 3.3 mmol/L — ABNORMAL LOW (ref 3.5–5.1)
Sodium: 138 mmol/L (ref 135–145)
Total Bilirubin: 0.3 mg/dL (ref 0.3–1.2)
Total Protein: 6.6 g/dL (ref 6.5–8.1)

## 2021-01-06 MED ORDER — SODIUM CHLORIDE 0.9 % IV SOLN: Freq: Once | INTRAVENOUS | Status: AC

## 2021-01-06 MED ORDER — TAFASITAMAB-CXIX CHEMO INJECTION 200 MG
12.0000 mg/kg | Freq: Once | INTRAVENOUS | Status: AC
  Administered 2021-01-06: 1000 mg via INTRAVENOUS
  Filled 2021-01-06: qty 25

## 2021-01-06 MED ORDER — SODIUM CHLORIDE 0.9% FLUSH
10.0000 mL | Freq: Once | INTRAVENOUS | Status: AC
  Administered 2021-01-06: 10 mL

## 2021-01-06 MED ORDER — SODIUM CHLORIDE 0.9% FLUSH
10.0000 mL | INTRAVENOUS | Status: DC | PRN
  Administered 2021-01-06: 10 mL

## 2021-01-06 MED ORDER — HEPARIN SOD (PORK) LOCK FLUSH 100 UNIT/ML IV SOLN
500.0000 [IU] | Freq: Once | INTRAVENOUS | Status: AC | PRN
  Administered 2021-01-06: 500 [IU]

## 2021-01-06 MED ORDER — SODIUM CHLORIDE 0.9 % IV SOLN
20.0000 mg | Freq: Once | INTRAVENOUS | Status: AC
  Administered 2021-01-06: 20 mg via INTRAVENOUS
  Filled 2021-01-06: qty 20

## 2021-01-06 MED ORDER — FAMOTIDINE 20 MG IN NS 100 ML IVPB
20.0000 mg | Freq: Once | INTRAVENOUS | Status: AC
  Administered 2021-01-06: 20 mg via INTRAVENOUS
  Filled 2021-01-06: qty 100

## 2021-01-06 MED ORDER — DIPHENHYDRAMINE HCL 50 MG/ML IJ SOLN
50.0000 mg | Freq: Once | INTRAMUSCULAR | Status: AC
Start: 2021-01-06 — End: 2021-01-06
  Administered 2021-01-06: 50 mg via INTRAVENOUS
  Filled 2021-01-06: qty 1

## 2021-01-06 MED ORDER — ACETAMINOPHEN 325 MG PO TABS
650.0000 mg | ORAL_TABLET | Freq: Once | ORAL | Status: AC
  Administered 2021-01-06: 650 mg via ORAL
  Filled 2021-01-06: qty 2

## 2021-01-06 NOTE — Progress Notes (Signed)
OK to treat with today's KCL 3.3 and Calcium 8.7 per Dr Lorenso Courier

## 2021-01-06 NOTE — Progress Notes (Signed)
Radiation Oncology         [phone]) [phone] ________________________________  Name: Brian Castillo MRN: [id number]  Date: 01/03/2021  DOB: 13-Jul-1940  Follow-Up Visit Note  Outpatient  CC: Shon Baton, MD  Shon Baton, MD  Diagnosis and Prior Radiotherapy:    ICD-10-CM   1. 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  Brian Castillo presents today for 2 week follow-up after completing palliative radiation (quad shot) to his right cheek 12/18/2020 and higher dose RT to the bilateral neck and dominant right neck mass- 50Gy total, (which had completely 12/04/20.  Pain issues, if any: states he has pain 1/10 in right neck area Using a feeding tube?: N/A Weight changes, if any: BP (!) 127/57 (BP Location: Right Arm, Patient Position: Sitting)   Pulse 67   Ht 5' 8"  (1.727 m)   Wt 168 lb 3.2 oz (76.3 kg)   SpO2 98%   BMI 25.57 kg/m   Swallowing issues, if any: none Smoking or chewing tobacco? none Using fluoride trays daily? none Last ENT visit was on: 10/29/2020 Saw Dr. Melida Quitter to assess wound drainage Other notable issues, if any: started chemo Last systemic infusion 12/30/2020 Patient thinks he is doing okay except for the draining lymph node. He remains very fatigued. States he is eating okay and drinking okay          He has a upcoming wound care consultation at the wound care center.         He states that the nodularity in the right cheek has improved since the quad shot treatment but he continues to have massive disease in the right level 2 region of his neck, see photo below               ALLERGIES:  is allergic to brimonidine tartrate-timolol, lisinopril, and netarsudil dimesylate.  Meds: Current Outpatient Medications  Medication Sig Dispense Refill   acetaminophen (TYLENOL) 325 MG tablet Take 2 tablets  (650 mg total) by mouth every 4 (four) hours as needed for mild pain, moderate pain, fever or headache (or Fever >/= 101).     allopurinol (ZYLOPRIM) 300 MG tablet TAKE 1 TABLET BY MOUTH EVERY DAY 90 tablet 1   amLODipine (NORVASC) 5 MG tablet Take 5 mg by mouth Daily.      Calcium Carb-Cholecalciferol (CALCIUM 600 + D PO) Take 1 tablet by mouth daily.     cholecalciferol (VITAMIN D) 1000 units tablet Take 1,000 Units by mouth daily.     gabapentin (NEURONTIN) 300 MG capsule Take 2 capsules (600 mg total) by mouth 3 (three) times daily. Take 1 capsule (300 mg total) by mouth twice a day and 2 capsules (672m) at bedtime (Patient taking differently: Take 600 mg by mouth 3 (three) times daily.) 180 capsule 1   HYDROmorphone (DILAUDID) 2 MG tablet Take 1 tablet (2 mg total) by mouth every 4 (four) hours as needed for severe pain. 60 tablet 0   Latanoprostene Bunod (VYZULTA) 0.024 % SOLN Place 1 drop into both eyes at bedtime.     lenalidomide (REVLIMID) 25 MG capsule Take 1 capsule (25 mg total) by mouth daily. Take for 21 days, then none for 7 days. Repeat as ordered. [employer] Auth # [id number] Date Obtained 12/18/20 21 capsule 0   lidocaine-prilocaine (EMLA) cream Apply 1  application topically as needed. (Patient taking differently: Apply 1 application topically daily as needed (port access).) 30 g 0   metoprolol succinate (TOPROL-XL) 25 MG 24 hr tablet Take 25 mg by mouth Daily.      phenytoin (DILANTIN) 100 MG ER capsule Take 200 mg by mouth 2 (two) times daily.     polyethylene glycol (MIRALAX / GLYCOLAX) 17 g packet Take 17 g by mouth daily. 14 each 0   rosuvastatin (CRESTOR) 20 MG tablet Take 20 mg by mouth daily.     timolol (BETIMOL) 0.5 % ophthalmic solution Place 1 drop into both eyes 2 (two) times daily.     aspirin 81 MG tablet Take 81 mg by mouth daily. (Patient not taking: Reported on 01/03/2021)     doxycycline (VIBRAMYCIN) 100 MG capsule Take 1 capsule (100 mg total) by mouth 2 (two) times  daily. 20 capsule 0   No current facility-administered medications for this encounter.    Physical Findings: The patient is in no acute distress. Patient is alert and oriented.  height is 5' 8"  (1.727 m) and weight is 168 lb 3.2 oz (76.3 kg). His blood pressure is 127/57 (abnormal) and his pulse is 67. His oxygen saturation is 98%. [name]    HEENT: See photo above.  The patient continues to have a massive fungating mass in the right level 2 neck.  The swelling and nodularity in the right upper cheek has improved.  No new or persistent masses throughout the neck otherwise.  Oral cavity is clear  Lab Findings: Lab Results  Component Value Date   WBC 4.5 01/04/2021   HGB 11.2 (L) 01/04/2021   HCT 33.0 (L) 01/04/2021   MCV 93.0 01/04/2021   PLT 196 01/04/2021    Radiographic Findings: DG Chest 2 View  Result Date: 12/10/2020 CLINICAL DATA:  Cough and fever EXAM: CHEST - 2 VIEW COMPARISON:  06/25/2020 FINDINGS: Coarsened lung markings over the lower lobes on the lateral view with scar-like appearance by prior chest CT February 2022. There is no edema, consolidation, effusion, or pneumothorax. Normal heart size and mediastinal contours. Unremarkable porta catheter positioning IMPRESSION: No evidence of active disease. Electronically Signed   By: Jorje Guild M.D.   On: 12/10/2020 05:18   CT Soft Tissue Neck W Contrast  Result Date: 12/10/2020 CLINICAL DATA:  Lymphoma under treatment. Malaise and fever. Ulcerated right lateral neck, site of radiotherapy EXAM: CT NECK WITH CONTRAST TECHNIQUE: Multidetector CT imaging of the neck was performed using the standard protocol following the bolus administration of intravenous contrast. CONTRAST:  11m OMNIPAQUE IOHEXOL 350 MG/ML SOLN COMPARISON:  09/11/2020 FINDINGS: Pharynx and larynx: No evidence of mass or thickening of Waldeyer's ring. Palatine tonsilliths Salivary glands: Right submandibular mass involving the parotid tail and submandibular glands,  progressed in size, and necrotic with central low-density, gas, and opening to the skin surface. The mass measures up to 7 cm in span. Increased solid tumor invasion into the right parotid and right anterior masseter. The mass is closely associated with the right facial artery, without visible pseudoaneurysm. Thyroid: Normal Lymph nodes: It is unclear if the right neck mass was initially a node. No other enlarged or heterogeneous nodes. Vascular: As above. Atheromatous calcifications and right IJ porta catheter. 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 Visualized orbits: Negative Mastoids and visualized paranasal sinuses: Clear Skeleton: Patient's mass is in continuity with the right mandibular body without asymmetric bone loss or erosion. Upper chest: 2 new ground-glass  nodules in the right upper lobe measuring 1 cm. IMPRESSION: 1. ~7 cm trans spatial right neck mass with marked size progression from June 2020 and extensive necrosis with evidence of draining to the skin. The right facial artery traverses the necrotic mass. 2. Two new ground-glass nodules in the right upper lobe which could be lymphomatous. Electronically Signed   By: Jorje Guild M.D.   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 EXAM: CT-GUIDED BONE MARROW BIOPSY AND ASPIRATION MEDICATIONS: None ANESTHESIA/SEDATION: Fentanyl 100 mcg IV; Versed 2 mg IV Sedation Time: 10 minutes; The patient was continuously monitored during the procedure by the interventional radiology nurse under my direct supervision. COMPLICATIONS: None immediate. PROCEDURE: Informed consent was obtained from the patient following an explanation of the procedure, risks, benefits and alternatives. The patient understands, agrees and consents for the procedure. All questions were addressed. A time out was performed prior to the initiation of the procedure.  The patient was positioned prone and non-contrast localization CT was performed of the pelvis to demonstrate the iliac marrow spaces. The operative site was prepped and draped in the usual sterile fashion. Under sterile conditions and local anesthesia, a 22 gauge spinal needle was utilized for procedural planning. Next, an 11 gauge coaxial bone biopsy needle was advanced into the right iliac marrow space. Needle position was confirmed with CT imaging. Initially, a bone marrow aspiration was performed. Next, a bone marrow biopsy was obtained with the 11 gauge outer bone marrow device. Samples were prepared with the cytotechnologist and deemed adequate. The needle was removed and superficial hemostasis was obtained with manual compression. A dressing was applied. The patient tolerated the procedure well without immediate post procedural complication. IMPRESSION: Successful CT guided right iliac bone marrow aspiration and core biopsy. Ruthann Cancer, MD [facility] Radiology Electronically Signed   By: Ruthann Cancer M.D.   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 EXAM: CT-GUIDED BONE MARROW BIOPSY AND ASPIRATION MEDICATIONS: None ANESTHESIA/SEDATION: Fentanyl 100 mcg IV; Versed 2 mg IV Sedation Time: 10 minutes; The patient was continuously monitored during the procedure by the interventional radiology nurse under my direct supervision. COMPLICATIONS: None immediate. PROCEDURE: Informed consent was obtained from the patient following an explanation of the procedure, risks, benefits and alternatives. The patient understands, agrees and consents for the procedure. All questions were addressed. A time out was performed prior to the initiation of the procedure. The patient was positioned prone and non-contrast localization CT was performed of the pelvis to  demonstrate the iliac marrow spaces. The operative site was prepped and draped in the usual sterile fashion. Under sterile conditions and local anesthesia, a 22 gauge spinal needle was utilized for procedural planning. Next, an 11 gauge coaxial bone biopsy needle was advanced into the right iliac marrow space. Needle position was confirmed with CT imaging. Initially, a bone marrow aspiration was performed. Next, a bone marrow biopsy was obtained with the 11 gauge outer bone marrow device. Samples were prepared with the cytotechnologist and deemed adequate. The needle was removed and superficial hemostasis was obtained with manual compression. A dressing was applied. The patient tolerated the procedure well without immediate post procedural complication. IMPRESSION: Successful CT guided right iliac bone marrow aspiration and core biopsy. Ruthann Cancer, MD [facility] Radiology Electronically Signed   By:  Ruthann Cancer M.D.   On: 12/17/2020 12:46    Impression/Plan:   This is a very nice 80 year old gentleman with highly aggressive lymphoma of the head and neck which has been refractory to radiation.  I am glad that the quad shot regimen did help the new onset progressive nodularity that had developed in his right cheek while we had been giving radiation to the remainder of his neck.  His main issue is a progressive massive fungating mass in the right level 2.  Despite getting a dose of 50 Pearline Cables this has grown through therapy.  He is now receiving systemic therapy under the care of Dr. Lorenso Courier.  He has a pending appointment with the wound care center as well.  I will see the patient back on an as-needed basis and he will follow closely with medical oncology.  He is pleased with this plan and I wished him and his wife the very best as they navigate this highly aggressive disease.  On date of service, in total, I spent 20 minutes on this encounter. Patient was seen in  person.  _____________________________________   Eppie Gibson, MD

## 2021-01-06 NOTE — Patient Instructions (Signed)
[facility]  Discharge Instructions: Thank you for choosing [facility] to provide your oncology and hematology care.   If you have a lab appointment with the [facility], please go directly to the [facility] and check in at the registration area.   Wear comfortable clothing and clothing appropriate for easy access to any Portacath or PICC line.   We strive to give you quality time with your provider. You may need to reschedule your appointment if you arrive late (15 or more minutes).  Arriving late affects you and other patients whose appointments are after yours.  Also, if you miss three or more appointments without notifying the office, you may be dismissed from the clinic at the provider's discretion.      For prescription refill requests, have your pharmacy contact our office and allow 72 hours for refills to be completed.    Today you received the following chemotherapy and/or immunotherapy agents [place]      To help prevent nausea and vomiting after your treatment, we encourage you to take your nausea medication as directed.  BELOW ARE SYMPTOMS THAT SHOULD BE REPORTED IMMEDIATELY: *FEVER GREATER THAN 100.4 F (38 C) OR HIGHER *CHILLS OR SWEATING *NAUSEA AND VOMITING THAT IS NOT CONTROLLED WITH YOUR NAUSEA MEDICATION *UNUSUAL SHORTNESS OF BREATH *UNUSUAL BRUISING OR BLEEDING *URINARY PROBLEMS (pain or burning when urinating, or frequent urination) *BOWEL PROBLEMS (unusual diarrhea, constipation, pain near the anus) TENDERNESS IN MOUTH AND THROAT WITH OR WITHOUT PRESENCE OF ULCERS (sore throat, sores in mouth, or a toothache) UNUSUAL RASH, SWELLING OR PAIN  UNUSUAL VAGINAL DISCHARGE OR ITCHING   Items with * indicate a potential emergency and should be followed up as soon as possible or go to the Emergency Department if any problems should occur.  Please show the CHEMOTHERAPY ALERT CARD or IMMUNOTHERAPY ALERT CARD at check-in to the  Emergency Department and triage nurse.  Should you have questions after your visit or need to cancel or reschedule your appointment, please contact [facility]  Dept: [phone]  and follow the prompts.  Office hours are 8:00 a.m. to 4:30 p.m. Monday - Friday. Please note that voicemails left after 4:00 p.m. may not be returned until the following business day.  We are closed weekends and major holidays. You have access to a nurse at all times for urgent questions. Please call the main number to the clinic Dept: [phone] and follow the prompts.   For any non-urgent questions, you may also contact your provider using MyChart. We now offer e-Visits for anyone 35 and older to request care online for non-urgent symptoms. For details visit [email or web address].   Also download the MyChart app! Go to the app store, search "MyChart", open the app, select [facility], and log in with your MyChart username and password.  Due to Covid, a mask is required upon entering the hospital/clinic. If you do not have a mask, one will be given to you upon arrival. For doctor visits, patients may have 1 support person aged 51 or older with them. For treatment visits, patients cannot have anyone with them due to current Covid guidelines and our immunocompromised population.

## 2021-01-06 NOTE — Progress Notes (Signed)
Calculated dose slightly outside of 10% (10.2%). Pts weight has been fluctuating a couple of pounds throughout treatment. Dose remained at 1000 mg with 10/17 infusion per MD request. Will continue with dose of 1000 mg for now and monitor for further weight changes requiring dose adjustment.  Benn Moulder, PharmD Pharmacy Resident  01/06/2021 10:07 AM

## 2021-01-07 ENCOUNTER — Encounter: Payer: Self-pay | Admitting: Hematology and Oncology

## 2021-01-07 NOTE — Progress Notes (Signed)
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 Lorenso Courier  4) 02/22/2020: PET CT scan showed Deauville 4 and Deauville 5 adenopathy bilaterally in the neck. There is also a Deauville 4 left supraclavicular lymph node and a Deauville 4 right gastric lymph node 5) 04/24/2020: presents with rapid enlargement of his right submandibular lymph node.  6) 05/20/2020: partial excision of right submandibular lymph node with attempted fluid drainage. Pathology consistent with follicular lymphoma, no clear evidence of transformation.  7) 06/12/2020: Cycle 1 Day 1 of R-Benda. 8) 07/10/2020: Cycle 2 Day 1 of R-Benda. 9) 08/07/2020: Cycle 3 Day 1 of R-Benda. Noted to have enlargement of right cervical lymph node on exam.  10) 08/28/2020: Cycle 1 Day 1 of R-miniCHOP 11) 09/20/2020: Cycle 2 Day 2 of R-miniCHOP 12) 10/11/2020: Cycle 3 of R-miniCHOP HELD due to progression. 13) 10/30/2020: start of palliative radiation therapy 14) 12/30/2020: Cycle 1 Day 1 of Monjuvi/Lenalidomide  Interval History:  Brian Castillo 80 y.o. 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 The patient's last visit was on 12/30/2020. In the interim since the last visit he underwent day 1  and day 4 of his Monjuvi therapy but mild up in the emergency department over the weekend due to concern for foul smell from his facial mass wound.  On exam today Brian Castillo notes he developed a "rank smell" from his facial mass.  There was a lot of discharge which he described as whitish.  He went to [facility] emergency department [facility] where they prescribed him doxycycline to try to help with this.  He notes that he has been having issues with fatigue and "I want to sleep all the time".  He notes that his pain has been under good control with the Dilaudid medication.  He notes that he is continuing to sleep in a lounge chair as this is what is most comfortable for him.  He reports that he is not having any issues with fevers, chills, sweats, nausea, vomiting or diarrhea.  A full 10 point ROS is listed below.  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   Seizures ([place])    last seizure was in 1988    SURGICAL HISTORY: Past Surgical History:  Procedure Laterality Date   CARPOMETACARPEL SUSPENSION PLASTY Right 08/24/2014   Procedure: RIGHT CMC ARTHOPLASTY WITH DOUBLE TENDON TRANSFER AND REPAIR  RECONSTRUCTION ;  Surgeon: Roseanne Kaufman, MD;  Location: [facility];  Service: Orthopedics;  Laterality: Right;   CHOLECYSTECTOMY     COLONOSCOPY     IR IMAGING GUIDED PORT INSERTION  08/14/2020   IR RADIOLOGIST EVAL & MGMT  08/20/2020   KNEE ARTHROSCOPY  2003   bilateral   POLYPECTOMY      SOCIAL HISTORY: Social History   Socioeconomic History   Marital status: Married    Spouse name: Not on file   Number of children: Not on file   Years of education: Not on file   Highest education level: Not on file   Occupational History   Not on file  Tobacco Use   Smoking status: Former   Smokeless tobacco: Never   Tobacco comments:    quit 1984  Vaping Use   Vaping Use: Never used  Substance and Sexual Activity   Alcohol use: Yes    Alcohol/week: 6.0 standard drinks    Types: 6 Glasses of wine per week   Drug use: No   Sexual activity: Not on file  Other Topics Concern   Not on file  Social History Narrative   Not on file   Social Determinants of Health   Financial Resource Strain: Not on file  Food Insecurity: Not on file  Transportation Needs: Not on file  Physical Activity: Not on file  Stress: Not on file  Social Connections: Not on file  Intimate Partner Violence: Not on file    FAMILY HISTORY: Family History  Problem Relation Age of Onset   Colon cancer Neg Hx    Colon polyps Neg Hx     ALLERGIES:  is allergic to brimonidine tartrate-timolol, lisinopril, and netarsudil dimesylate.  MEDICATIONS:  Current Outpatient Medications  Medication Sig Dispense Refill   acetaminophen (TYLENOL) 325 MG tablet Take 2 tablets (650 mg total) by mouth every 4 (four) hours as needed for mild pain, moderate pain, fever or headache (or Fever >/= 101).     allopurinol (ZYLOPRIM) 300 MG tablet TAKE 1 TABLET BY MOUTH EVERY DAY 90 tablet 1   amLODipine (NORVASC) 5 MG tablet Take 5 mg by mouth Daily.      Calcium Carb-Cholecalciferol (CALCIUM 600 + D PO) Take 1 tablet by mouth daily.     cholecalciferol (VITAMIN D) 1000 units tablet Take 1,000 Units by mouth daily.     doxycycline (VIBRAMYCIN) 100 MG capsule Take 1 capsule (100 mg total) by mouth 2 (two) times daily. 20 capsule 0   gabapentin (NEURONTIN) 300 MG capsule Take 2 capsules (600 mg total) by mouth 3 (three) times daily. Take 1 capsule (300 mg total) by mouth twice a day and 2 capsules (687m) at bedtime (Patient taking differently: Take 600 mg by mouth 3 (three) times daily.) 180 capsule 1   HYDROmorphone (DILAUDID) 2 MG tablet Take  1 tablet (2 mg total) by mouth every 4 (four) hours as needed for severe pain. 60 tablet 0   Latanoprostene Bunod (VYZULTA) 0.024 % SOLN Place 1 drop into both eyes at bedtime.     lenalidomide (REVLIMID) 25 MG capsule Take 1 capsule (25 mg total) by mouth daily. Take for 21 days, then none for 7 days. Repeat as ordered. [employer] Auth # [id number] Date Obtained 12/18/20 21 capsule 0   lidocaine-prilocaine (EMLA) cream Apply 1 application topically as needed. (Patient taking differently: Apply 1 application topically daily as needed (port access).) 30 g 0   metoprolol succinate (TOPROL-XL) 25 MG 24 hr tablet Take 25 mg by mouth Daily.  phenytoin (DILANTIN) 100 MG ER capsule Take 200 mg by mouth 2 (two) times daily.     polyethylene glycol (MIRALAX / GLYCOLAX) 17 g packet Take 17 g by mouth daily. 14 each 0   timolol (BETIMOL) 0.5 % ophthalmic solution Place 1 drop into both eyes 2 (two) times daily.     aspirin 81 MG tablet Take 81 mg by mouth daily. (Patient not taking: No sig reported)     rosuvastatin (CRESTOR) 20 MG tablet Take 20 mg by mouth daily.     No current facility-administered medications for this visit.    REVIEW OF SYSTEMS:   Constitutional: ( - ) fevers, ( - )  chills , ( - ) night sweats Eyes: ( - ) blurriness of vision, ( - ) double vision, ( - ) watery eyes Ears, nose, mouth, throat, and face: ( - ) mucositis, ( - ) sore throat Respiratory: ( - ) cough, ( - ) dyspnea, ( - ) wheezes Cardiovascular: ( - ) palpitation, ( - ) chest discomfort, ( - ) lower extremity swelling Gastrointestinal:  ( - ) nausea, ( - ) heartburn, ( - ) change in bowel habits Skin: ( - ) abnormal skin rashes Lymphatics: ( - ) new lymphadenopathy, ( - ) easy bruising Neurological: ( - ) numbness, ( - ) tingling, ( - ) new weaknesses Behavioral/Psych: ( - ) mood change, ( - ) new changes  All other systems were reviewed with the patient and are negative.  PHYSICAL EXAMINATION: ECOG PERFORMANCE STATUS:  1 - Symptomatic but completely ambulatory  Vitals:   01/06/21 0839  BP: (!) 140/57  Pulse: 73  Resp: 17  Temp: 97.6 F (36.4 C)  SpO2: 97%    Filed Weights   01/06/21 0839  Weight: 165 lb 3.2 oz (74.9 kg)     GENERAL: well appearing elderly Caucasian male in NAD  SKIN: skin color, texture, turgor are normal, no rashes or significant lesions EYES: conjunctiva are pink and non-injected, sclera clear NECK: supple, non-tender LYMPH:  Massive right sided submandibular lymph node. Size is increasing rapidly, large confluent ulcers have developed that drain. Otherwise no other marked palpable lymphadenopathy in the cervical, axillary or supraclavicular lymph nodes.  LUNGS: clear to auscultation and percussion with normal breathing effort HEART: regular rate & rhythm and no murmurs and no lower extremity edema Musculoskeletal: no cyanosis of digits and no clubbing  PSYCH: alert & oriented x 3, fluent speech NEURO: no focal motor/sensory deficits  LABORATORY DATA:  I have reviewed the data as listed CBC Latest Ref Rng & Units 01/06/2021 01/04/2021 12/30/2020  WBC 4.0 - 10.5 K/uL 5.4 4.5 4.0  Hemoglobin 13.0 - 17.0 g/dL 11.0(L) 11.2(L) 10.9(L)  Hematocrit 39.0 - 52.0 % 32.1(L) 33.0(L) 32.2(L)  Platelets 150 - 400 K/uL 188 196 222    CMP Latest Ref Rng & Units 01/06/2021 01/04/2021 12/30/2020  Glucose 70 - 99 mg/dL 136(H) 108(H) 107(H)  BUN 8 - 23 mg/dL 13 9 12   Creatinine 0.61 - 1.24 mg/dL 0.63 0.54(L) 0.69  Sodium 135 - 145 mmol/L 138 139 140  Potassium 3.5 - 5.1 mmol/L 3.3(L) 3.3(L) 4.1  Chloride 98 - 111 mmol/L 102 101 104  CO2 22 - 32 mmol/L 29 28 26   Calcium 8.9 - 10.3 mg/dL 8.7(L) 8.9 9.4  Total Protein 6.5 - 8.1 g/dL 6.6 - 6.8  Total Bilirubin 0.3 - 1.2 mg/dL 0.3 - 0.3  Alkaline Phos 38 - 126 U/L 84 - 111  AST 15 -  41 U/L 15 - 18  ALT 0 - 44 U/L 21 - 23   RADIOGRAPHIC STUDIES: I have personally reviewed the radiological images as listed and agreed with the findings  in the report: stable lymphadenopathy with exception of the rapidly enlarging submandibular lymph node.   DG Chest 2 View  Result Date: 12/10/2020 CLINICAL DATA:  Cough and fever EXAM: CHEST - 2 VIEW COMPARISON:  06/25/2020 FINDINGS: Coarsened lung markings over the lower lobes on the lateral view with scar-like appearance by prior chest CT February 2022. There is no edema, consolidation, effusion, or pneumothorax. Normal heart size and mediastinal contours. Unremarkable porta catheter positioning IMPRESSION: No evidence of active disease. Electronically Signed   By: Jorje Guild M.D.   On: 12/10/2020 05:18   CT Soft Tissue Neck W Contrast  Result Date: 12/10/2020 CLINICAL DATA:  Lymphoma under treatment. Malaise and fever. Ulcerated right lateral neck, site of radiotherapy EXAM: CT NECK WITH CONTRAST TECHNIQUE: Multidetector CT imaging of the neck was performed using the standard protocol following the bolus administration of intravenous contrast. CONTRAST:  40m OMNIPAQUE IOHEXOL 350 MG/ML SOLN COMPARISON:  09/11/2020 FINDINGS: Pharynx and larynx: No evidence of mass or thickening of Waldeyer's ring. Palatine tonsilliths Salivary glands: Right submandibular mass involving the parotid tail and submandibular glands, progressed in size, and necrotic with central low-density, gas, and opening to the skin surface. The mass measures up to 7 cm in span. Increased solid tumor invasion into the right parotid and right anterior masseter. The mass is closely associated with the right facial artery, without visible pseudoaneurysm. Thyroid: Normal Lymph nodes: It is unclear if the right neck mass was initially a node. No other enlarged or heterogeneous nodes. Vascular: As above. Atheromatous calcifications and right IJ porta catheter. 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 Visualized orbits: Negative Mastoids and visualized paranasal sinuses: Clear Skeleton: Patient's mass is  in continuity with the right mandibular body without asymmetric bone loss or erosion. Upper chest: 2 new ground-glass nodules in the right upper lobe measuring 1 cm. IMPRESSION: 1. ~7 cm trans spatial right neck mass with marked size progression from June 2020 and extensive necrosis with evidence of draining to the skin. The right facial artery traverses the necrotic mass. 2. Two new ground-glass nodules in the right upper lobe which could be lymphomatous. Electronically Signed   By: JJorje GuildM.D.   On: 12/10/2020 07:39   CT BIOPSY  Result Date: 12/17/2020 INDICATION: 80year old male with history of lymphoma neutropenia. EXAM: CT-GUIDED BONE MARROW BIOPSY AND ASPIRATION MEDICATIONS: None ANESTHESIA/SEDATION: Fentanyl 100 mcg IV; Versed 2 mg IV Sedation Time: 10 minutes; The patient was continuously monitored during the procedure by the interventional radiology nurse under my direct supervision. COMPLICATIONS: None immediate. PROCEDURE: Informed consent was obtained from the patient following an explanation of the procedure, risks, benefits and alternatives. The patient understands, agrees and consents for the procedure. All questions were addressed. A time out was performed prior to the initiation of the procedure. The patient was positioned prone and non-contrast localization CT was performed of the pelvis to demonstrate the iliac marrow spaces. The operative site was prepped and draped in the usual sterile fashion. Under sterile conditions and local anesthesia, a 22 gauge spinal needle was utilized for procedural planning. Next, an 11 gauge coaxial bone biopsy needle was advanced into the right iliac marrow space. Needle position was confirmed with CT imaging. Initially, a bone marrow aspiration was performed. Next, a bone marrow biopsy  was obtained with the 11 gauge outer bone marrow device. Samples were prepared with the cytotechnologist and deemed adequate. The needle was removed and superficial  hemostasis was obtained with manual compression. A dressing was applied. The patient tolerated the procedure well without immediate post procedural complication. IMPRESSION: Successful CT guided right iliac bone marrow aspiration and core biopsy. Ruthann Cancer, MD [facility] Electronically Signed   By: Ruthann Cancer M.D.   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 EXAM: CT-GUIDED BONE MARROW BIOPSY AND ASPIRATION MEDICATIONS: None ANESTHESIA/SEDATION: Fentanyl 100 mcg IV; Versed 2 mg IV Sedation Time: 10 minutes; The patient was continuously monitored during the procedure by the interventional radiology nurse under my direct supervision. COMPLICATIONS: None immediate. PROCEDURE: Informed consent was obtained from the patient following an explanation of the procedure, risks, benefits and alternatives. The patient understands, agrees and consents for the procedure. All questions were addressed. A time out was performed prior to the initiation of the procedure. The patient was positioned prone and non-contrast localization CT was performed of the pelvis to demonstrate the iliac marrow spaces. The operative site was prepped and draped in the usual sterile fashion. Under sterile conditions and local anesthesia, a 22 gauge spinal needle was utilized for procedural planning. Next, an 11 gauge coaxial bone biopsy needle was advanced into the right iliac marrow space. Needle position was confirmed with CT imaging. Initially, a bone marrow aspiration was performed. Next, a bone marrow biopsy was obtained with the 11 gauge outer bone marrow device. Samples were prepared with the cytotechnologist and deemed adequate. The needle was removed and superficial hemostasis was obtained with manual compression. A dressing was applied. The patient tolerated the  procedure well without immediate post procedural complication. IMPRESSION: Successful CT guided right iliac bone marrow aspiration and core biopsy. Ruthann Cancer, MD [facility] Electronically Signed   By: Ruthann Cancer M.D.   On: 12/17/2020 12:46    ASSESSMENT & PLAN Brian Castillo 80 y.o. 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 The patient's last visit was on 02/12/2020 at which time he establish care.  After review the labs, the records, discussion with the patient the findings are most consistent with a stage III follicular lymphoma based on the PET CT scan.  There are lymph nodes on both sides of diaphragm and therefore stage III would be the most appropriate designation.    Previously we discussed the nature of follicular lymphoma and the treatment options moving forward.  The options at this time would be observation, immunotherapy, or combination of chemotherapy and immunotherapy.  Given the lack of symptoms, cytopenias, and bulky disease at the time of presentation we decided to proceed with observation alone. Unfortunately due to rapid progression we need to consider treatment moving forward.  Additionally we discussed rituximab monotherapy with weekly dosing x4 doses followed by every 2 months dosing x4 as a treatment option as well as bendamustine and rituximab on days 1 and 2 of a 28-day cycle x6.  We discussed the side effects, risks and benefits, and possible outcomes of treatment.  The patient voiced his understanding of these options and wished to proceed with R-Benda.   Over the period of one month the patient was found to have a rapidly enlarging right submandibular lymph node.  Due to  concern for this enlarging lymph node we ordered a CT scan of the chest abdomen pelvis to assess for progression elsewhere, but that was the only lymph node that was progressing. Biopsy  with ENT progression into a DLBCL, likely a double hit based on pathology.   On PET CT scan from 08/23/2020 the patient showed complete response of disease with every site with the exception of his right cervical lymph node which is increasing in size and brightness.  As such we have to discontinue first-line therapy with rituximab and Bendamustine and transition over to second line R mini CHOP.  R mini CHOP chemotherapy Cycle 1 Day1 started on 08/28/2020.  Unfortunately when he came time to proceed to cycle 3 it was evident that the lymph node in the neck had started to grow again rapidly.  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  We are currently planning for 2nd line therapy with R-miniCHOP(which would also be effective if patient is having transformation of the tumor).  -- patient completed PET, Port, and TTE prior to start of R-miniCHOP. --prior PET CT findings are consistent with a Stage III follicular lymphoma. Due to rapidly enlarging submandibular lymph node we proceeded with treatment.  --imaging shows stable disease save for a single enlarging submandibular lymph node.  Prior biopsy has shown transformation into a more aggressive lymphoma.  --started Cycle 1 Day 1 of R-Benda chemotherapy on 06/12/2020 --completed Cycle 3 Day of R-Benda. Unfortunately he had progression for which we changed his therapy to R-miniCHOP.  --08/28/2020 was Day 1 of Cycle 1 of R-miniCHOP. Prior to start of Cycle 3 Day 1 he was noted to have increased size of the lymph node.  Plan: -- proceed with Monjuvi +lenalidomide 26m PO  --today is Cycle 1 Day 8 of Monjuvi/Revlimid --RTC next week for treatment and in 2 weeks to reassess in clinic  #Neutropenia, resolved --received GCSF during hospitalization for neutropenia --WBC currently >4.0 with normal ANC --continue to monitor   #Pain  Control, improved --patient is having severe pain in his midline neck up to his scalp --extensive workup including CT/MRI scans of head/neck and LP have revealed no clear etiology for his findings --pain is responding well to gabapentin 300 BID with flexeril and dilaudid at night.  --prescribed oxycodone15 mg PO q 4H PRN with ibuprofen 6033mQ6H PRN. (Not currently taking this) --Dilaudid 2 mg p.o.q 4 hour PRN, mostly used at night.  --Continue to monitor  #Medication Interaction --Allopurinol increases the levels of Dilantin. --We will work with the patient's primary care provider Dr. JoShon Batono monitor his Dilantin levels and adjust accordingly. For now we are following this weekly.  #Fever, resolved --Single episode of fever up to 101 F occurred on 06/25/2020. --Patient was not neutropenic at that time and was evaluated emergency department with infectious work-up including chest x-ray, blood cultures, and urine culture.  Data at that time showed no evidence of infectious disease. --No further fevers noted since that time.  We will continue to monitor.  #Supportive Care --chemotherapy education completed --zofran 60m20m8H PRN and compazine 70m4m q6H for nausea -- allopurinol 300mg56mdaily for TLS prophylaxis -- port in place -- pain medication as above   No orders of the defined types were placed in this encounter.  All questions were answered. The patient knows to call the clinic with any problems, questions or  concerns.  A total of more than 30 minutes were spent on this encounter and over half of that time was spent on counseling and coordination of care as outlined above.   Ledell Peoples, MD Department of Hematology/Oncology [place] [facility] Phone: [phone] Pager: [phone] Email: [email or web address]  01/07/2021 5:01 PM

## 2021-01-13 ENCOUNTER — Inpatient Hospital Stay: Payer: Medicare Other

## 2021-01-13 ENCOUNTER — Other Ambulatory Visit: Payer: Self-pay

## 2021-01-13 VITALS — BP 139/59 | HR 60 | Temp 97.7°F | Resp 16 | Wt 162.5 lb

## 2021-01-13 DIAGNOSIS — C8511 Unspecified B-cell lymphoma, lymph nodes of head, face, and neck: Secondary | ICD-10-CM | POA: Diagnosis not present

## 2021-01-13 DIAGNOSIS — Z95828 Presence of other vascular implants and grafts: Secondary | ICD-10-CM

## 2021-01-13 DIAGNOSIS — C8331 Diffuse large B-cell lymphoma, lymph nodes of head, face, and neck: Secondary | ICD-10-CM

## 2021-01-13 DIAGNOSIS — C8251 Diffuse follicle center lymphoma, lymph nodes of head, face, and neck: Secondary | ICD-10-CM

## 2021-01-13 LAB — CMP (CANCER CENTER ONLY)
ALT: 15 U/L (ref 0–44)
AST: 14 U/L — ABNORMAL LOW (ref 15–41)
Albumin: 3 g/dL — ABNORMAL LOW (ref 3.5–5.0)
Alkaline Phosphatase: 147 U/L — ABNORMAL HIGH (ref 38–126)
Anion gap: 10 (ref 5–15)
BUN: 23 mg/dL (ref 8–23)
CO2: 25 mmol/L (ref 22–32)
Calcium: 8.7 mg/dL — ABNORMAL LOW (ref 8.9–10.3)
Chloride: 101 mmol/L (ref 98–111)
Creatinine: 0.77 mg/dL (ref 0.61–1.24)
GFR, Estimated: >60 mL/min (ref >60)
Glucose, Bld: 118 mg/dL — ABNORMAL HIGH (ref 70–99)
Potassium: 3.7 mmol/L (ref 3.5–5.1)
Sodium: 136 mmol/L (ref 135–145)
Total Bilirubin: 0.4 mg/dL (ref 0.3–1.2)
Total Protein: 6.2 g/dL — ABNORMAL LOW (ref 6.5–8.1)

## 2021-01-13 LAB — CBC WITH DIFFERENTIAL (CANCER CENTER ONLY)
Abs Immature Granulocytes: 0.03 10*3/uL (ref 0.00–0.07)
Basophils Absolute: 0 10*3/uL (ref 0.0–0.1)
Basophils Relative: 1 %
Eosinophils Absolute: 0.7 10*3/uL — ABNORMAL HIGH (ref 0.0–0.5)
Eosinophils Relative: 15 %
HCT: 34.5 % — ABNORMAL LOW (ref 39.0–52.0)
Hemoglobin: 11.7 g/dL — ABNORMAL LOW (ref 13.0–17.0)
Immature Granulocytes: 1 %
Lymphocytes Relative: 6 %
Lymphs Abs: 0.3 10*3/uL — ABNORMAL LOW (ref 0.7–4.0)
MCH: 31.2 pg (ref 26.0–34.0)
MCHC: 33.9 g/dL (ref 30.0–36.0)
MCV: 92 fL (ref 80.0–100.0)
Monocytes Absolute: 0.6 10*3/uL (ref 0.1–1.0)
Monocytes Relative: 12 %
Neutro Abs: 3.1 10*3/uL (ref 1.7–7.7)
Neutrophils Relative %: 65 %
Platelet Count: 192 10*3/uL (ref 150–400)
RBC: 3.75 MIL/uL — ABNORMAL LOW (ref 4.22–5.81)
RDW: 13.5 % (ref 11.5–15.5)
WBC Count: 4.8 10*3/uL (ref 4.0–10.5)
nRBC: 0 % (ref 0.0–0.2)

## 2021-01-13 LAB — LACTATE DEHYDROGENASE: LDH: 308 U/L — ABNORMAL HIGH (ref 98–192)

## 2021-01-13 MED ORDER — HEPARIN SOD (PORK) LOCK FLUSH 100 UNIT/ML IV SOLN
500.0000 [IU] | Freq: Once | INTRAVENOUS | Status: AC | PRN
  Administered 2021-01-13: 500 [IU]

## 2021-01-13 MED ORDER — SODIUM CHLORIDE 0.9% FLUSH
10.0000 mL | Freq: Once | INTRAVENOUS | Status: AC
  Administered 2021-01-13: 10 mL

## 2021-01-13 MED ORDER — SODIUM CHLORIDE 0.9 % IV SOLN: Freq: Once | INTRAVENOUS | Status: AC

## 2021-01-13 MED ORDER — PROCHLORPERAZINE MALEATE 10 MG PO TABS
10.0000 mg | ORAL_TABLET | Freq: Once | ORAL | Status: AC
  Administered 2021-01-13: 10 mg via ORAL
  Filled 2021-01-13: qty 1

## 2021-01-13 MED ORDER — SODIUM CHLORIDE 0.9% FLUSH
10.0000 mL | INTRAVENOUS | Status: DC | PRN
  Administered 2021-01-13: 10 mL

## 2021-01-13 MED ORDER — TAFASITAMAB-CXIX CHEMO INJECTION 200 MG
12.0000 mg/kg | Freq: Once | INTRAVENOUS | Status: AC
  Administered 2021-01-13: 1000 mg via INTRAVENOUS
  Filled 2021-01-13: qty 25

## 2021-01-13 NOTE — Progress Notes (Signed)
Pt took Dilaudid for jaw pain prior to appt today.

## 2021-01-13 NOTE — Patient Instructions (Signed)
[facility]  Discharge Instructions: Thank you for choosing [facility] to provide your oncology and hematology care.   If you have a lab appointment with the [facility], please go directly to the [facility] and check in at the registration area.   Wear comfortable clothing and clothing appropriate for easy access to any Portacath or PICC line.   We strive to give you quality time with your provider. You may need to reschedule your appointment if you arrive late (15 or more minutes).  Arriving late affects you and other patients whose appointments are after yours.  Also, if you miss three or more appointments without notifying the office, you may be dismissed from the clinic at the provider's discretion.      For prescription refill requests, have your pharmacy contact our office and allow 72 hours for refills to be completed.    Today you received the following chemotherapy and/or immunotherapy agents: Monjuvi   To help prevent nausea and vomiting after your treatment, we encourage you to take your nausea medication as directed.  BELOW ARE SYMPTOMS THAT SHOULD BE REPORTED IMMEDIATELY: *FEVER GREATER THAN 100.4 F (38 C) OR HIGHER *CHILLS OR SWEATING *NAUSEA AND VOMITING THAT IS NOT CONTROLLED WITH YOUR NAUSEA MEDICATION *UNUSUAL SHORTNESS OF BREATH *UNUSUAL BRUISING OR BLEEDING *URINARY PROBLEMS (pain or burning when urinating, or frequent urination) *BOWEL PROBLEMS (unusual diarrhea, constipation, pain near the anus) TENDERNESS IN MOUTH AND THROAT WITH OR WITHOUT PRESENCE OF ULCERS (sore throat, sores in mouth, or a toothache) UNUSUAL RASH, SWELLING OR PAIN  UNUSUAL VAGINAL DISCHARGE OR ITCHING   Items with * indicate a potential emergency and should be followed up as soon as possible or go to the Emergency Department if any problems should occur.  Please show the CHEMOTHERAPY ALERT CARD or IMMUNOTHERAPY ALERT CARD at check-in to the  Emergency Department and triage nurse.  Should you have questions after your visit or need to cancel or reschedule your appointment, please contact [facility]  Dept: [phone]  and follow the prompts.  Office hours are 8:00 a.m. to 4:30 p.m. Monday - Friday. Please note that voicemails left after 4:00 p.m. may not be returned until the following business day.  We are closed weekends and major holidays. You have access to a nurse at all times for urgent questions. Please call the main number to the clinic Dept: [phone] and follow the prompts.   For any non-urgent questions, you may also contact your provider using MyChart. We now offer e-Visits for anyone 33 and older to request care online for non-urgent symptoms. For details visit [email or web address].   Also download the MyChart app! Go to the app store, search "MyChart", open the app, select [facility], and log in with your MyChart username and password.  Due to Covid, a mask is required upon entering the hospital/clinic. If you do not have a mask, one will be given to you upon arrival. For doctor visits, patients may have 1 support person aged 58 or older with them. For treatment visits, patients cannot have anyone with them due to current Covid guidelines and our immunocompromised population.

## 2021-01-13 NOTE — Progress Notes (Signed)
Maintain Monjuvi dose at 1000 mg despite weight changes.  Will re-access with office visit on 01/20/21.  Patient has tolerated first 3 infusions and premedication has been reduced to prochlorperazine oral prior to [facility].  Henreitta Leber, PharmD 01/13/21 @ 1400

## 2021-01-15 ENCOUNTER — Telehealth: Payer: Self-pay | Admitting: *Deleted

## 2021-01-15 ENCOUNTER — Other Ambulatory Visit: Payer: Self-pay | Admitting: Hematology and Oncology

## 2021-01-15 ENCOUNTER — Other Ambulatory Visit (HOSPITAL_COMMUNITY): Payer: Self-pay

## 2021-01-15 ENCOUNTER — Other Ambulatory Visit: Payer: Self-pay

## 2021-01-15 ENCOUNTER — Encounter: Payer: Self-pay | Admitting: Hematology and Oncology

## 2021-01-15 ENCOUNTER — Encounter (HOSPITAL_BASED_OUTPATIENT_CLINIC_OR_DEPARTMENT_OTHER): Payer: Medicare Other | Attending: Physician Assistant | Admitting: Physician Assistant

## 2021-01-15 ENCOUNTER — Other Ambulatory Visit: Payer: Self-pay | Admitting: *Deleted

## 2021-01-15 DIAGNOSIS — C8588 Other specified types of non-Hodgkin lymphoma, lymph nodes of multiple sites: Secondary | ICD-10-CM | POA: Diagnosis not present

## 2021-01-15 DIAGNOSIS — L98492 Non-pressure chronic ulcer of skin of other sites with fat layer exposed: Secondary | ICD-10-CM | POA: Diagnosis not present

## 2021-01-15 DIAGNOSIS — Z9221 Personal history of antineoplastic chemotherapy: Secondary | ICD-10-CM | POA: Diagnosis not present

## 2021-01-15 DIAGNOSIS — I10 Essential (primary) hypertension: Secondary | ICD-10-CM | POA: Diagnosis not present

## 2021-01-15 DIAGNOSIS — L598 Other specified disorders of the skin and subcutaneous tissue related to radiation: Secondary | ICD-10-CM | POA: Insufficient documentation

## 2021-01-15 LAB — PHENYTOIN LEVEL, FREE AND TOTAL
Phenytoin, Free: 1.5 ug/mL (ref 1.0–2.0)
Phenytoin, Total: 14.3 ug/mL (ref 10.0–20.0)

## 2021-01-15 MED ORDER — GABAPENTIN 300 MG PO CAPS: 600.0000 mg | ORAL_CAPSULE | Freq: Three times a day (TID) | ORAL | 3 refills | Status: AC

## 2021-01-15 MED ORDER — HYDROMORPHONE HCL 2 MG PO TABS: 2.0000 mg | ORAL_TABLET | ORAL | 0 refills | Status: DC | PRN

## 2021-01-15 NOTE — Progress Notes (Signed)
                                                                                                                                                             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 ([place]) Staging form: Hodgkin and Non-Hodgkin Lymphoma, AJCC 8th Edition - Clinical: Stage III (Follicular lymphoma) - Signed by Orson Slick, MD on 05/29/2020 Stage prefix: Initial diagnosis  Intent: Curative initially, with transition to palliative intent when patient developed progressive disease in the right cheek  Radiation Treatment Dates: 10/30/2020 through 12/18/2020 Site Technique Total Dose (Gy) Dose per Fx (Gy) Completed Fx Beam Energies  Neck: HN_right 3D 20/20 2 10/10 6X, 10X  Neck: HN_Bst_bilat IMRT 30/30 2 15/15 6X  Parotid, Right: HN_Rt_cheek 3D 14.8/14.8 3.7 4/4 6X   Narrative: The patient tolerated radiation therapy relatively well but unfortunately he demonstrated refractory disease in the dominant right upper neck mass and progressive disease outside the radiation fields in the right cheek (parotid region).  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  The patient received a total dose of 50 Gray in 25 daily fractions to the dominant right neck mass, 30 Gray in 15 daily fractions to the bilateral empiric neck nodes, and 14.8 Gray in 4 fractions, given twice a day, to the right cheek.  Plan: The patient will follow-up with radiation oncology in one half mo; he will follow closely with medical oncology in the interim with hopes of starting systemic therapy again  soon. ------------------------------------------  Eppie Gibson, MD

## 2021-01-15 NOTE — Telephone Encounter (Signed)
Received call from pt requesting refill of his dilaudid and gabapentin. This was done. He also states he was seen in the wound care clinic today. They were to apply a more absorbant dressing and order suppliers for him. They will see him again in 2 weeks.  We will see him here on 01/20/21

## 2021-01-16 NOTE — Progress Notes (Signed)
OSBORN, PULLIN ([id number]) Visit Report for 01/15/2021 Chief Complaint Document Details Patient Name: Date of Service: Brian Castillo, Brian Castillo 01/15/2021 1:15 PM Medical Record Number: [id number] Patient Account Number: [id number] Date of Birth/Sex: Treating RN: 02/25/1941 (80 y.o. Male) Baruch Gouty Primary Care Provider: Shon Baton Other Clinician: Referring Provider: Treating Provider/Extender: Glee Arvin in Treatment: 0 Information Obtained from: Patient Chief Complaint Malignant wound right cheek Electronic Signature(s) Signed: 01/15/2021 2:10:41 PM By: Worthy Keeler PA-C Entered By: Worthy Keeler on 01/15/2021 14:10:41 -------------------------------------------------------------------------------- Brian Details Patient Name: Date of Service: Brian Castillo, Brian Castillo 01/15/2021 1:15 PM Medical Record Number: [id number] Patient Account Number: [id number] Date of Birth/Sex: Treating RN: 1941-02-11 (80 y.o. Male) Brian Castillo Primary Care Provider: Shon Baton Other Clinician: Referring Provider: Treating Provider/Extender: Glee Arvin in Treatment: 0 Brian Performed for Assessment: Wound #1 Right Cheek Performed By: Clinician Brian Pilling, RN Brian Type: Chemical/Enzymatic/Mechanical Agent Used: gauze and wound cleanser Level of Consciousness (Pre-procedure): Awake and Alert Pre-procedure Verification/Time Out No Taken: Bleeding: None Response to Treatment: Procedure was tolerated well Level of Consciousness (Post- Awake and Alert procedure): Post Brian Measurements of Total Wound Length: (cm) 8.5 Width: (cm) 8.9 Depth: (cm) 0.5 Volume: (cm) 29.708 Character of Wound/Ulcer Post Brian: Stable Post Procedure Diagnosis Same as Pre-procedure Electronic Signature(s) Signed: 01/15/2021 6:26:15 PM By: Worthy Keeler PA-C Signed: 01/16/2021 6:11:26 PM By: Brian Pilling RN, BSN Entered By: Brian Castillo on 01/15/2021 14:18:19 -------------------------------------------------------------------------------- Brian Details Patient Name: Date of Service: Brian Castillo, Brian J. 01/15/2021 1:15 PM Medical Record Number: [id number] Patient Account Number: [id number] Date of Birth/Sex: Treating RN: [birth year] (80 y.o. Male) Baruch Gouty Primary Care Provider: Shon Baton Other Clinician: Referring Provider: Treating Provider/Extender: Glee Arvin in Treatment: 0 History of Present Illness Brian Description: 01/15/2021 upon evaluation today patient presents for initial evaluation in our clinic concerning issues he is been having with wound on the side of his face down towards the jaw location. 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 Unfortunately he has been having significant issues with this for some time he has been undergoing in the past radiation therapy and more recently chemotherapy for this area. Nonetheless the wound was not there until just around August. You initially was sent to the ear nose throat doctor where they did try to aspirate since that time its opened up significantly. It was infected where he went to the hospital a couple weeks back. Fortunately that seems to be clear at this point. Still nonetheless he has significant issues with drainage which has been quite an issue for him as well. Fortunately he does not have any major medical problems other than hypertension and the lymphoma. Electronic Signature(s) Signed: 01/15/2021 6:07:38 PM By: Worthy Keeler PA-C Entered By: Worthy Keeler on 01/15/2021 18:07:37 -------------------------------------------------------------------------------- Brian Exam Details Patient Name: Date of Service: Brian Castillo, Brian Castillo 01/15/2021 1:15 PM Medical Record Number: [id number] Patient Account Number: [id number] Date of Birth/Sex: Treating RN: 1941/01/23 (80  y.o. Male) Baruch Gouty Primary Care Provider: Shon Baton Other Clinician: Referring Provider: Treating Provider/Extender: Glee Arvin in Treatment: 0 Constitutional sitting or standing blood pressure is within target range for patient.. pulse regular and within target range for patient.[name] respirations regular, non-labored and within target range for patient.[name] temperature within target range for patient.. Well-nourished and well-hydrated in no acute distress. Eyes conjunctiva clear no  eyelid edema noted. pupils equal round and reactive to light and accommodation. Respiratory normal breathing without difficulty. Psychiatric this patient is able to make decisions and demonstrates good insight into disease process. Alert and Oriented x 3. pleasant and cooperative. Notes Upon inspection patient's wound bed actually showed signs of having a significant wound which is malignant in nature. Unfortunately I do not think there is good to be a way for [place] to achieve complete resolution and healing of this area. I think what we can to be looking at is more of a palliative management try to keep it clean try to keep it from becoming infected. Also think managing the drainage is to be of utmost concern as well as any malodorous discharge that may also come into play here. The patient voiced understanding. Subsequently to that end I think CarboFlex could be of benefit for him as well. Electronic Signature(s) Signed: 01/15/2021 6:13:13 PM By: Worthy Keeler PA-C Entered By: Worthy Keeler on 01/15/2021 18:13:13 -------------------------------------------------------------------------------- Physician Orders Details Patient Name: Date of Service: Brian Castillo, Brian J. 01/15/2021 1:15 PM Medical Record Number: [id number] Patient Account Number: [id number] Date of Birth/Sex: Treating RN: April 27, 1940 (80 y.o. Male) Brian Castillo Primary Care Provider: Shon Baton Other  Clinician: Referring Provider: Treating Provider/Extender: Glee Arvin in Treatment: 0 Verbal / Phone Orders: No Diagnosis Coding ICD-10 Coding Code Description C85.88 Other specified types of non-Hodgkin lymphoma, lymph nodes of multiple sites L59.8 Other specified disorders of the skin and subcutaneous tissue related to radiation L98.492 Non-pressure chronic ulcer of skin of other sites with fat layer exposed Z92.21 Personal history of antineoplastic chemotherapy I10 Essential (primary) hypertension Follow-up Appointments ppointment in 2 weeks. Jeri Cos, PA Return A Bathing/ Shower/ Hygiene May shower and wash wound with soap and water. - with dressing changes. Wound Treatment Wound #1 - Cheek Wound Laterality: Right Cleanser: Soap and Water 1 x Per Day/30 Days Discharge Instructions: May shower and wash wound with dial antibacterial soap and water prior to dressing change. Prim Dressing: KerraCel Ag Gelling Fiber Dressing, 4x5 in (silver alginate) (DME) (Generic) 1 x Per Day/30 Days ary Discharge Instructions: Apply silver alginate to wound bed as instructed Secondary Dressing: Woven Gauze Sponge, Non-Sterile 4x4 in (DME) (Generic) 1 x Per Day/30 Days Discharge Instructions: Apply over primary dressing as directed. Secondary Dressing: CarboFLEX Odor Control Dressing, 4x4 in (DME) (Generic) 1 x Per Day/30 Days Discharge Instructions: Apply over primary dressing as directed. Secured With: 45M Medipore H Soft Cloth Surgical T ape, 4 x 10 (in/yd) (DME) (Generic) 1 x Per Day/30 Days Discharge Instructions: Secure with tape as directed. Electronic Signature(s) Signed: 01/15/2021 6:26:15 PM By: Worthy Keeler PA-C Signed: 01/16/2021 6:11:26 PM By: Brian Pilling RN, BSN Entered By: Brian Castillo on 01/15/2021 16:53:06 -------------------------------------------------------------------------------- Brian List Details Patient Name: Date of Service: Brian Castillo,  Brian Babe. 01/15/2021 1:15 PM Medical Record Number: [id number] Patient Account Number: [id number] Date of Birth/Sex: Treating RN: 05/01/1940 (80 y.o. Male) Baruch Gouty Primary Care Provider: Shon Baton Other Clinician: Referring Provider: Treating Provider/Extender: Glee Arvin in Treatment: 0 Active Problems ICD-10 Encounter Code Description Active Date MDM Diagnosis C85.88 Other specified types of non-Hodgkin lymphoma, lymph nodes of multiple 01/15/2021 No Yes sites L59.8 Other specified disorders of the skin and subcutaneous tissue related to 01/15/2021 No Yes radiation L98.492 Non-pressure chronic ulcer of skin of other sites with fat layer exposed 01/15/2021 No Yes Z92.21 Personal history of antineoplastic chemotherapy 01/15/2021 No  Yes I10 Essential (primary) hypertension 01/15/2021 No Yes Inactive Problems Resolved Problems Electronic Signature(s) Signed: 01/15/2021 2:10:04 PM By: Worthy Keeler PA-C Entered By: Worthy Keeler on 01/15/2021 14:10:04 -------------------------------------------------------------------------------- Brian Note Details Patient Name: Date of Service: Brian Castillo, Brian J. 01/15/2021 1:15 PM Medical Record Number: [id number] Patient Account Number: [id number] Date of Birth/Sex: Treating RN: 1940/08/06 (80 y.o. Male) Baruch Gouty Primary Care Provider: Shon Baton Other Clinician: Referring Provider: Treating Provider/Extender: Glee Arvin in Treatment: 0 Subjective Chief Complaint Information obtained from Patient Malignant wound right cheek History of Present Illness (Brian) 01/15/2021 upon evaluation today patient presents for initial evaluation in our clinic concerning issues he is been having with wound on the side of his face down towards the jaw location. 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 Unfortunately he has been having  significant issues with this for some time he has been undergoing in the past radiation therapy and more recently chemotherapy for this area. Nonetheless the wound was not there until just around August. You initially was sent to the ear nose throat doctor where they did try to aspirate since that time its opened up significantly. It was infected where he went to the hospital a couple weeks back. Fortunately that seems to be clear at this point. Still nonetheless he has significant issues with drainage which has been quite an issue for him as well. Fortunately he does not have any major medical problems other than hypertension and the lymphoma. Patient History Allergies brimonidine tartrate (Reaction: swelling of the eyes), lisinopril (Reaction: hives), netarsudil mesylate (Reaction: swelling of the eyes.) Social History Former smoker - 1984, Marital Status - Married, Alcohol Use - Never, Drug Use - No History, Caffeine Use - Never. Medical History Eyes Patient has history of Cataracts, Glaucoma Denies history of Optic Neuritis Ear/Nose/Mouth/Throat Denies history of Chronic sinus problems/congestion, Middle ear problems Hematologic/Lymphatic Denies history of Anemia, Hemophilia, Human Immunodeficiency Virus, Lymphedema, Sickle Cell Disease Respiratory Denies history of Aspiration, Asthma, Chronic Obstructive Pulmonary Disease (COPD), Pneumothorax, Sleep Apnea, Tuberculosis Cardiovascular Patient has history of Hypertension Denies history of Angina, Arrhythmia, Congestive Heart Failure, Coronary Artery Disease, Deep Vein Thrombosis, Hypotension, Myocardial Infarction, Peripheral Arterial Disease, Peripheral Venous Disease, Phlebitis, Vasculitis Gastrointestinal Denies history of Cirrhosis , Colitis, Crohnoos, Hepatitis A, Hepatitis B, Hepatitis C Endocrine Denies history of Type I Diabetes, Type II Diabetes Genitourinary Denies history of End Stage Renal Disease Immunological Denies  history of Lupus Erythematosus, Raynaudoos, Scleroderma Integumentary (Skin) Denies history of History of Burn Musculoskeletal Patient has history of Osteoarthritis Denies history of Gout, Rheumatoid Arthritis, Osteomyelitis Neurologic Patient has history of Seizure Disorder - last one 1988 Denies history of Dementia, Neuropathy, Quadriplegia, Paraplegia Oncologic Patient has history of Received Chemotherapy - 12/30/2020, Received Radiation - 10/2020 Psychiatric Denies history of Anorexia/bulimia, Confinement Anxiety Hospitalization/Surgery History - carpometacarpel suspension plasty 2016 bilateral thumbs. - cholestectomy. - knee arthroscopy bilateral 2003. Medical A Surgical History Notes nd Cardiovascular hyperlipidemia Oncologic Stage lll follicular lymphoma Review of Systems (ROS) Constitutional Symptoms (General Health) Denies complaints or symptoms of Fatigue, Fever, Chills, Marked Weight Change. Eyes Complains or has symptoms of Glasses / Contacts. Denies complaints or symptoms of Dry Eyes, Vision Changes. Ear/Nose/Mouth/Throat Denies complaints or symptoms of Chronic sinus problems or rhinitis. Respiratory Denies complaints or symptoms of Chronic or frequent coughs, Shortness of Breath. Cardiovascular Denies complaints or symptoms of Chest pain. Gastrointestinal Denies complaints or symptoms of Frequent diarrhea, Nausea, Vomiting.  Endocrine Denies complaints or symptoms of Heat/cold intolerance. Genitourinary Denies complaints or symptoms of Frequent urination. Integumentary (Skin) Complains or has symptoms of Wounds - right side of face. Musculoskeletal Denies complaints or symptoms of Muscle Pain, Muscle Weakness. Neurologic Denies complaints or symptoms of Numbness/parasthesias. Psychiatric Denies complaints or symptoms of Claustrophobia, Suicidal. Objective Constitutional sitting or standing blood pressure is within target range for patient.. pulse regular  and within target range for patient.[name] respirations regular, non-labored and within target range for patient.[name] temperature within target range for patient.. Well-nourished and well-hydrated in no acute distress. Vitals Time Taken: 1:30 PM, Height: 70 in, Source: Stated, Weight: 153 lbs, Source: Stated, BMI: 22, Temperature: 98.5 F, Pulse: 65 bpm, Respiratory Rate: 20 breaths/min, Blood Pressure: 118/60 mmHg. Eyes conjunctiva clear no eyelid edema noted. pupils equal round and reactive to light and accommodation. Respiratory normal breathing without difficulty. Psychiatric this patient is able to make decisions and demonstrates good insight into disease process. Alert and Oriented x 3. pleasant and cooperative. General Notes: Upon inspection patient's wound bed actually showed signs of having a significant wound which is malignant in nature. Unfortunately I do not think there is good to be a way for [place] to achieve complete resolution and healing of this area. I think what we can to be looking at is more of a palliative management try to keep it clean try to keep it from becoming infected. Also think managing the drainage is to be of utmost concern as well as any malodorous discharge that may also come into play here. The patient voiced understanding. Subsequently to that end I think CarboFlex could be of benefit for him as well. Integumentary (Hair, Skin) Wound #1 status is Open. Original cause of wound was Other Lesion. The date acquired was: 10/14/2020. The wound is located on the Right Cheek. The wound measures 8.5cm length x 8.9cm width x 0.5cm depth; 59.415cm^2 area and 29.708cm^3 volume. There is Fat Layer (Subcutaneous Tissue) exposed. There is no tunneling or undermining noted. There is a medium amount of serosanguineous drainage noted. The wound margin is distinct with the outline attached to the wound base. There is small (1-33%) pink granulation within the wound bed. There is a large  (67-100%) amount of necrotic tissue within the wound bed including Eschar and Adherent Slough. Assessment Active Problems ICD-10 Other specified types of non-Hodgkin lymphoma, lymph nodes of multiple sites Other specified disorders of the skin and subcutaneous tissue related to radiation Non-pressure chronic ulcer of skin of other sites with fat layer exposed Personal history of antineoplastic chemotherapy Essential (primary) hypertension Procedures Wound #1 Pre-procedure diagnosis of Wound #1 is a Malignant Wound located on the Right Cheek . There was a Chemical/Enzymatic/Mechanical Brian performed by Brian Pilling, RN.. Other agent used was gauze and wound cleanser. There was no bleeding. The procedure was tolerated well. Post Brian Measurements: 8.5cm length x 8.9cm width x 0.5cm depth; 29.708cm^3 volume. Character of Wound/Ulcer Post Brian is stable. Post procedure Diagnosis Wound #1: Same as Pre-Procedure Plan Follow-up Appointments: Return Appointment in 2 weeks. Jeri Cos, PA Bathing/ Shower/ Hygiene: May shower and wash wound with soap and water. - with dressing changes. WOUND #1: - Cheek Wound Laterality: Right Cleanser: Soap and Water 1 x Per Day/30 Days Discharge Instructions: May shower and wash wound with dial antibacterial soap and water prior to dressing change. Prim Dressing: KerraCel Ag Gelling Fiber Dressing, 4x5 in (silver alginate) (DME) (Generic) 1 x Per Day/30 Days ary Discharge Instructions: Apply silver alginate  to wound bed as instructed Secondary Dressing: Woven Gauze Sponge, Non-Sterile 4x4 in (DME) (Generic) 1 x Per Day/30 Days Discharge Instructions: Apply over primary dressing as directed. Secondary Dressing: CarboFLEX Odor Control Dressing, 4x4 in (DME) (Generic) 1 x Per Day/30 Days Discharge Instructions: Apply over primary dressing as directed. Secured With: 66M Medipore H Soft Cloth Surgical T ape, 4 x 10 (in/yd) (DME) (Generic) 1  x Per Day/30 Days Discharge Instructions: Secure with tape as directed. 1. Would recommend currently that we go ahead and initiate treatment with the CarboFlex after applying the alginate that is silver alginate primarily. Subsequently after the CarboFlex will use gauze to secure in place. 2. I am also can recommend that she be changed daily. If things start to become a little bit more malodorous we can always look into using topical Flagyl crushed to sprinkle into the wound or something equivalent. We will see patient back for reevaluation in 2 weeks here in the clinic. If anything worsens or changes patient will contact our office for additional recommendations. Electronic Signature(s) Signed: 01/15/2021 6:13:49 PM By: Worthy Keeler PA-C Entered By: Worthy Keeler on 01/15/2021 18:13:49 -------------------------------------------------------------------------------- HxROS Details Patient Name: Date of Service: Brian Castillo, Kern J. 01/15/2021 1:15 PM Medical Record Number: [id number] Patient Account Number: [id number] Date of Birth/Sex: Treating RN: 1940/07/09 (80 y.o. Male) Brian Castillo Primary Care Provider: Shon Baton Other Clinician: Referring Provider: Treating Provider/Extender: Glee Arvin in Treatment: 0 Constitutional Symptoms (General Health) Complaints and Symptoms: Negative for: Fatigue; Fever; Chills; Marked Weight Change Eyes Complaints and Symptoms: Positive for: Glasses / Contacts Negative for: Dry Eyes; Vision Changes Medical History: Positive for: Cataracts; Glaucoma Negative for: Optic Neuritis Ear/Nose/Mouth/Throat Complaints and Symptoms: Negative for: Chronic sinus problems or rhinitis Medical History: Negative for: Chronic sinus problems/congestion; Middle ear problems Respiratory Complaints and Symptoms: Negative for: Chronic or frequent coughs; Shortness of Breath Medical History: Negative for: Aspiration; Asthma; Chronic  Obstructive Pulmonary Disease (COPD); Pneumothorax; Sleep Apnea; Tuberculosis Cardiovascular Complaints and Symptoms: Negative for: Chest pain Medical History: Positive for: Hypertension Negative for: Angina; Arrhythmia; Congestive Heart Failure; Coronary Artery Disease; Deep Vein Thrombosis; Hypotension; Myocardial Infarction; Peripheral Arterial Disease; Peripheral Venous Disease; Phlebitis; Vasculitis Past Medical History Notes: hyperlipidemia Gastrointestinal Complaints and Symptoms: Negative for: Frequent diarrhea; Nausea; Vomiting Medical History: Negative for: Cirrhosis ; Colitis; Crohns; Hepatitis A; Hepatitis B; Hepatitis C Endocrine Complaints and Symptoms: Negative for: Heat/cold intolerance Medical History: Negative for: Type I Diabetes; Type II Diabetes Genitourinary Complaints and Symptoms: Negative for: Frequent urination Medical History: Negative for: End Stage Renal Disease Integumentary (Skin) Complaints and Symptoms: Positive for: Wounds - right side of face Medical History: Negative for: History of Burn Musculoskeletal Complaints and Symptoms: Negative for: Muscle Pain; Muscle Weakness Medical History: Positive for: Osteoarthritis Negative for: Gout; Rheumatoid Arthritis; Osteomyelitis Neurologic Complaints and Symptoms: Negative for: Numbness/parasthesias Medical History: Positive for: Seizure Disorder - last one 1988 Negative for: Dementia; Neuropathy; Quadriplegia; Paraplegia Psychiatric Complaints and Symptoms: Negative for: Claustrophobia; Suicidal Medical History: Negative for: Anorexia/bulimia; Confinement Anxiety Hematologic/Lymphatic Medical History: Negative for: Anemia; Hemophilia; Human Immunodeficiency Virus; Lymphedema; Sickle Cell Disease Immunological Medical History: Negative for: Lupus Erythematosus; Raynauds; Scleroderma Oncologic Medical History: Positive for: Received Chemotherapy - 12/30/2020; Received Radiation -  10/2020 Past Medical History Notes: Stage lll follicular lymphoma HBO Extended History Items Eyes: Eyes: Cataracts Glaucoma Immunizations Pneumococcal Vaccine: Received Pneumococcal Vaccination: No Implantable Devices No devices added Hospitalization / Surgery History Type of Hospitalization/Surgery carpometacarpel suspension plasty 2016 bilateral thumbs cholestectomy  knee arthroscopy bilateral 2003 Family and Social History Former smoker - 1984; Marital Status - Married; Alcohol Use: Never; Drug Use: No History; Caffeine Use: Never; Financial Concerns: No; Food, Clothing or Shelter Needs: No; Support System Lacking: No; Transportation Concerns: No Electronic Signature(s) Signed: 01/15/2021 6:26:15 PM By: Worthy Keeler PA-C Signed: 01/16/2021 6:11:26 PM By: Brian Pilling RN, BSN Entered By: Brian Castillo on 01/15/2021 16:42:45 -------------------------------------------------------------------------------- Brian Details Patient Name: Date of Service: Brian Castillo, Brian Babe 01/15/2021 Medical Record Number: [id number] Patient Account Number: [id number] Date of Birth/Sex: Treating RN: Nov 19, 1940 (80 y.o. Male) Brian Castillo Primary Care Provider: Shon Baton Other Clinician: Referring Provider: Treating Provider/Extender: Glee Arvin in Treatment: 0 Diagnosis Coding ICD-10 Codes Code Description [phone] Other specified types of non-Hodgkin lymphoma, lymph nodes of multiple sites L59.8 Other specified disorders of the skin and subcutaneous tissue related to radiation L98.492 Non-pressure chronic ulcer of skin of other sites with fat layer exposed Z92.21 Personal history of antineoplastic chemotherapy I10 Essential (primary) hypertension Facility Procedures CPT4 Code: [id number] Description: [place] VISIT-LEV 3 EST PT Modifier: Quantity: 1 CPT4 Code: [id number] Description: 38101 - [place] W/O ANES NON SELECT Modifier: Quantity: 1 Physician  Procedures : CPT4 Code Description Modifier [id number] - WC PHYS LEVEL 4 - NEW PT ICD-10 Diagnosis Description C85.88 Other specified types of non-Hodgkin lymphoma, lymph nodes of multiple sites L59.8 Other specified disorders of the skin and subcutaneous tissue  related to radiation L98.492 Non-pressure chronic ulcer of skin of other sites with fat layer exposed Z92.21 Personal history of antineoplastic chemotherapy Quantity: 1 Electronic Signature(s) Signed: 01/15/2021 6:14:27 PM By: Worthy Keeler PA-C Entered By: Worthy Keeler on 01/15/2021 18:14:27

## 2021-01-16 NOTE — Progress Notes (Signed)
OLLEN, RAO ([id number]) Visit Report for 01/15/2021 Abuse/Suicide Risk Screen Details Patient Name: Date of Service: PO DOMNIC, VANTOL 01/15/2021 1:15 PM Medical Record Number: [id number] Patient Account Number: [id number] Date of Birth/Sex: Treating RN: 1940/05/16 (80 y.o. Male) Deon Pilling Primary Care Severn Goddard: Shon Baton Other Clinician: Referring Lorik Guo: Treating Iveth Heidemann/Extender: Glee Arvin in Treatment: 0 Abuse/Suicide Risk Screen Items Answer ABUSE RISK SCREEN: Has anyone close to you tried to hurt or harm you recentlyo No Do you feel uncomfortable with anyone in your familyo No Has anyone forced you do things that you didnt want to doo No Electronic Signature(s) Signed: 01/16/2021 6:11:26 PM By: Deon Pilling RN, BSN Entered By: Deon Pilling on 01/15/2021 13:41:52 -------------------------------------------------------------------------------- Activities of Daily Living Details Patient Name: Date of Service: PO ISAAK, DELMUNDO 01/15/2021 1:15 PM Medical Record Number: [id number] Patient Account Number: [id number] Date of Birth/Sex: Treating RN: Jul 19, 1940 (80 y.o. Male) Deon Pilling Primary Care Jaxon Mynhier: Shon Baton Other Clinician: Referring Kendra Woolford: Treating Bronislaw Switzer/Extender: Glee Arvin in Treatment: 0 Activities of Daily Living Items Answer Activities of Daily Living (Please select one for each item) Drive Automobile Completely Able T Medications ake Completely Able Use T elephone Completely Able Care for Appearance Completely Able Use T oilet Completely Able Bath / Shower Completely Able Dress Self Completely Able Feed Self Completely Able Walk Completely Able Get In / Out Bed Completely Able Housework Completely Able Prepare Meals Completely [place] for Self Completely Able Electronic Signature(s) Signed: 01/16/2021 6:11:26 PM By: Deon Pilling RN, BSN Entered By:  Deon Pilling on 01/15/2021 13:42:09 -------------------------------------------------------------------------------- Education Screening Details Patient Name: Date of Service: PO PE, Lum Babe. 01/15/2021 1:15 PM Medical Record Number: [id number] Patient Account Number: [id number] Date of Birth/Sex: Treating RN: [birth year] (80 y.o. Male) Deon Pilling Primary Care Fia Hebert: Shon Baton Other Clinician: Referring Trystyn Dolley: Treating Dilraj Killgore/Extender: Glee Arvin in Treatment: 0 Primary Learner Assessed: Patient Learning Preferences/Education Level/Primary Language Learning Preference: Explanation, Demonstration, Printed Material Highest Education Level: College or Above Preferred Language: English Cognitive Barrier Language Barrier: No Translator Needed: No Memory Deficit: No Emotional Barrier: No Cultural/Religious Beliefs Affecting Medical Care: No Physical Barrier Impaired Vision: Yes Glasses Impaired Hearing: No Decreased Hand dexterity: No Knowledge/Comprehension Knowledge Level: High Comprehension Level: High Ability to understand written instructions: High Ability to understand verbal instructions: High Motivation Anxiety Level: Calm Cooperation: Cooperative Education Importance: Acknowledges Need Interest in Health Problems: Asks Questions Perception: Coherent Willingness to Engage in Self-Management High Activities: Readiness to Engage in Self-Management High Activities: Electronic Signature(s) Signed: 01/16/2021 6:11:26 PM By: Deon Pilling RN, BSN Entered By: Deon Pilling on 01/15/2021 13:42:26 -------------------------------------------------------------------------------- Fall Risk Assessment Details Patient Name: Date of Service: PO PE, Alixander J. 01/15/2021 1:15 PM Medical Record Number: [id number] Patient Account Number: [id number] Date of Birth/Sex: Treating RN: 07/28/1940 (80 y.o. Male) Deon Pilling Primary Care Purvi Ruehl:  Shon Baton Other Clinician: Referring Daiwik Buffalo: Treating Dvonte Gatliff/Extender: Glee Arvin in Treatment: 0 Fall Risk Assessment Items Have you had 2 or more falls in the last 12 monthso 0 Yes Have you had any fall that resulted in injury in the last 12 monthso 0 Yes FALLS RISK SCREEN History of falling - immediate or within 3 months 0 No Secondary diagnosis (Do you have 2 or more medical diagnoseso) 0 No Ambulatory aid None/bed rest/wheelchair/nurse 0 Yes Crutches/cane/walker 0 No Furniture 0 No Intravenous therapy Access/Saline/Heparin Lock 0 No Gait/Transferring Normal/ bed rest/ wheelchair  0 No Weak (short steps with or without shuffle, stooped but able to lift head while walking, may seek 10 Yes support from furniture) Impaired (short steps with shuffle, may have difficulty arising from chair, head down, impaired 0 No balance) Mental Status Oriented to own ability 0 Yes Electronic Signature(s) Signed: 01/16/2021 6:11:26 PM By: Deon Pilling RN, BSN Entered By: Deon Pilling on 01/15/2021 13:42:58 -------------------------------------------------------------------------------- Foot Assessment Details Patient Name: Date of Service: PO PE, Lum Babe. 01/15/2021 1:15 PM Medical Record Number: [id number] Patient Account Number: [id number] Date of Birth/Sex: Treating RN: Apr 19, 1940 (80 y.o. Male) Deon Pilling Primary Care Thorvald Orsino: Shon Baton Other Clinician: Referring Danitza Schoenfeldt: Treating Laval Cafaro/Extender: Glee Arvin in Treatment: 0 Foot Assessment Items Site Locations + = Sensation present, - = Sensation absent, C = Callus, U = Ulcer R = Redness, W = Warmth, M = Maceration, PU = Pre-ulcerative lesion F = Fissure, S = Swelling, D = Dryness Assessment Right: Left: Other Deformity: No No Prior Foot Ulcer: No No Prior Amputation: No No Charcot Joint: No No Ambulatory Status: Gait: Steady Notes no BLE wounds. Electronic  Signature(s) Signed: 01/16/2021 6:11:26 PM By: Deon Pilling RN, BSN Entered By: Deon Pilling on 01/15/2021 13:43:28 -------------------------------------------------------------------------------- Nutrition Risk Screening Details Patient Name: Date of Service: PO PE, Max J. 01/15/2021 1:15 PM Medical Record Number: [id number] Patient Account Number: [id number] Date of Birth/Sex: Treating RN: Jan 11, 1941 (80 y.o. Male) Deon Pilling Primary Care Karrah Mangini: Shon Baton Other Clinician: Referring Destini Cambre: Treating Duana Benedict/Extender: Glee Arvin in Treatment: 0 Height (in): 70 Weight (lbs): 153 Body Mass Index (BMI): 22 Nutrition Risk Screening Items Score Screening NUTRITION RISK SCREEN: I have an illness or condition that made me change the kind and/or amount of food I eat 2 Yes I eat fewer than two meals per day 0 No I eat few fruits and vegetables, or milk products 0 No I have three or more drinks of beer, liquor or wine almost every day 0 No I have tooth or mouth problems that make it hard for me to eat 0 No I don't always have enough money to buy the food I need 0 No I eat alone most of the time 0 No I take three or more different prescribed or over-the-counter drugs a day 1 Yes Without wanting to, I have lost or gained 10 pounds in the last six months 2 Yes I am not always physically able to shop, cook and/or feed myself 0 No Nutrition Protocols Good Risk Protocol Moderate Risk Protocol 0 Provide education on nutrition High Risk Proctocol Risk Level: Moderate Risk Score: 5 Electronic Signature(s) Signed: 01/16/2021 6:11:26 PM By: Deon Pilling RN, BSN Entered By: Deon Pilling on 01/15/2021 13:43:15

## 2021-01-16 NOTE — Progress Notes (Addendum)
TRICIA, OAXACA ([id number]) Visit Report for 01/15/2021 Allergy List Details Patient Name: Date of Service: PO TERON, BLAIS 01/15/2021 1:15 PM Medical Record Number: [id number] Patient Account Number: [id number] Date of Birth/Sex: Treating RN: 31-Jul-1940 (80 y.o. Male) Deon Pilling Primary Care Tyniya Kuyper: Shon Baton Other Clinician: Referring Onofre Gains: Treating Mars Scheaffer/Extender: Glee Arvin in Treatment: 0 Allergies Active Allergies brimonidine tartrate Reaction: swelling of the eyes lisinopril Reaction: hives netarsudil mesylate Reaction: swelling of the eyes. Allergy Notes Electronic Signature(s) Signed: 01/16/2021 6:11:26 PM By: Deon Pilling RN, BSN Entered By: Deon Pilling on 01/15/2021 13:39:25 -------------------------------------------------------------------------------- Arrival Information Details Patient Name: Date of Service: PO PE, Lum Babe. 01/15/2021 1:15 PM Medical Record Number: [id number] Patient Account Number: [id number] Date of Birth/Sex: Treating RN: 05/31/1940 (80 y.o. Male) Deon Pilling Primary Care Jasenia Weilbacher: Shon Baton Other Clinician: Referring Connelly [place]: Treating Alieah Brinton/Extender: Glee Arvin in Treatment: 0 Visit Information Patient Arrived: Ambulatory Arrival Time: 13:32 Accompanied By: wife Transfer Assistance: None Patient Identification Verified: Yes Secondary Verification Process Completed: Yes Patient Requires Transmission-Based Precautions: No Patient Has Alerts: No Electronic Signature(s) Signed: 01/16/2021 6:11:26 PM By: Deon Pilling RN, BSN Entered By: Deon Pilling on 01/15/2021 13:33:56 -------------------------------------------------------------------------------- Clinic Level of Care Assessment Details Patient Name: Date of Service: PO PE, HILERY WINTLE 01/15/2021 1:15 PM Medical Record Number: [id number] Patient Account Number: [id number] Date of Birth/Sex: Treating  RN: 04/10/1940 (80 y.o. Male) Deon Pilling Primary Care Dyamond Tolosa: Shon Baton Other Clinician: Referring Jeremia Groot: Treating Qualyn Oyervides/Extender: Glee Arvin in Treatment: 0 Clinic Level of Care Assessment Items TOOL 1 Quantity Score X- 1 0 Use when EandM and Procedure is performed on INITIAL visit ASSESSMENTS - Nursing Assessment / Reassessment X- 1 20 General Physical Exam (combine w/ comprehensive assessment (listed just below) when performed on new pt. evals) X- 1 25 Comprehensive Assessment (HX, ROS, Risk Assessments, Wounds Hx, etc.) ASSESSMENTS - Wound and Skin Assessment / Reassessment X- 1 10 Dermatologic / Skin Assessment (not related to wound area) ASSESSMENTS - Ostomy and/or Continence Assessment and Care []  - 0 Incontinence Assessment and Management []  - 0 Ostomy Care Assessment and Management (repouching, etc.) PROCESS - Coordination of Care []  - 0 Simple Patient / Family Education for ongoing care X- 1 20 Complex (extensive) Patient / Family Education for ongoing care X- 1 10 Staff obtains Programmer, systems, Records, T Results / Process Orders est []  - 0 Staff telephones HHA, Nursing Homes / Clarify orders / etc []  - 0 Routine Transfer to another Facility (non-emergent condition) []  - 0 Routine Hospital Admission (non-emergent condition) X- 1 15 New Admissions / Biomedical engineer / Ordering NPWT Apligraf, etc. , []  - 0 Emergency Hospital Admission (emergent condition) PROCESS - Special Needs []  - 0 Pediatric / Minor Patient Management []  - 0 Isolation Patient Management []  - 0 Hearing / Language / Visual special needs []  - 0 Assessment of Community assistance (transportation, D/C planning, etc.) []  - 0 Additional assistance / Altered mentation []  - 0 Support Surface(s) Assessment (bed, cushion, seat, etc.) INTERVENTIONS - Miscellaneous []  - 0 External ear exam []  - 0 Patient Transfer (multiple staff / Civil Service fast streamer / Similar  devices) []  - 0 Simple Staple / Suture removal (25 or less) []  - 0 Complex Staple / Suture removal (26 or more) []  - 0 Hypo/Hyperglycemic Management (do not check if billed separately) []  - 0 Ankle / Brachial Index (ABI) - do not check if billed separately Has the patient been seen at the  hospital within the last three years: Yes Total Score: 100 Level Of Care: New/Established - Level 3 Electronic Signature(s) Signed: 01/16/2021 6:11:26 PM By: Deon Pilling RN, BSN Signed: 01/16/2021 6:11:26 PM By: Deon Pilling RN, BSN Entered By: Deon Pilling on 01/15/2021 14:23:08 -------------------------------------------------------------------------------- Encounter Discharge Information Details Patient Name: Date of Service: PO PE, Lum Babe. 01/15/2021 1:15 PM Medical Record Number: [id number] Patient Account Number: [id number] Date of Birth/Sex: Treating RN: [birth year] (80 y.o. Male) Deon Pilling Primary Care Makylie Rivere: Shon Baton Other Clinician: Referring Kathelyn Gombos: Treating Ancil Dewan/Extender: Glee Arvin in Treatment: 0 Encounter Discharge Information Items Post Procedure Vitals Discharge Condition: Stable Temperature (F): 98.5 Ambulatory Status: Ambulatory Pulse (bpm): 65 Discharge Destination: Home Respiratory Rate (breaths/min): 20 Transportation: Private Auto Blood Pressure (mmHg): 118/60 Accompanied By: wife Schedule Follow-up Appointment: No Clinical Summary of Care: Electronic Signature(s) Signed: 01/16/2021 6:11:26 PM By: Deon Pilling RN, BSN Entered By: Deon Pilling on 01/15/2021 14:24:16 -------------------------------------------------------------------------------- Lower Extremity Assessment Details Patient Name: Date of Service: PO PE, Lum Babe. 01/15/2021 1:15 PM Medical Record Number: [id number] Patient Account Number: [id number] Date of Birth/Sex: Treating RN: Nov 09, 1940 (80 y.o. Male) Deon Pilling Primary Care Sharman Garrott: Shon Baton Other Clinician: Referring Samrat Hayward: Treating Diani Jillson/Extender: Glee Arvin in Treatment: 0 Electronic Signature(s) Signed: 01/16/2021 6:11:26 PM By: Deon Pilling RN, BSN Entered By: Deon Pilling on 01/15/2021 13:43:38 -------------------------------------------------------------------------------- [place] Details Patient Name: Date of Service: PO PE, Lum Babe. 01/15/2021 1:15 PM Medical Record Number: [id number] Patient Account Number: [id number] Date of Birth/Sex: Treating RN: [birth year] (80 y.o. Male) Deon Pilling Primary Care Traylon Schimming: Shon Baton Other Clinician: Referring Yacqub Baston: Treating Klani Caridi/Extender: Glee Arvin in Treatment: 0 Active Inactive Electronic Signature(s) Signed: 01/27/2021 1:48:28 PM By: Deon Pilling RN, BSN Previous Signature: 01/16/2021 6:11:26 PM Version By: Deon Pilling RN, BSN Entered By: Deon Pilling on 01/27/2021 13:48:27 -------------------------------------------------------------------------------- Pain Assessment Details Patient Name: Date of Service: PO PE, Muhsin J. 01/15/2021 1:15 PM Medical Record Number: [id number] Patient Account Number: [id number] Date of Birth/Sex: Treating RN: 1941/01/18 (80 y.o. Male) Deon Pilling Primary Care Edita Weyenberg: Shon Baton Other Clinician: Referring Madiline Saffran: Treating Lydian Chavous/Extender: Glee Arvin in Treatment: 0 Active Problems Location of Pain Severity and Description of Pain Patient Has Paino Yes Site Locations Pain Location: Pain in Ulcers Rate the pain. Current Pain Level: 4 Worst Pain Level: 10 Least Pain Level: 0 Tolerable Pain Level: 8 Pain Management and Medication Current Pain Management: Medication: No Cold Application: No Rest: No Massage: No Activity: No T.E.N.S.: No Heat Application: No Leg drop or elevation: No Is the Current Pain Management Adequate: Adequate How  does your wound impact your activities of daily livingo Sleep: No Bathing: No Appetite: No Relationship With Others: No Bladder Continence: No Emotions: No Bowel Continence: No Work: No Toileting: No Drive: No Dressing: No Hobbies: No Electronic Signature(s) Signed: 01/16/2021 6:11:26 PM By: Deon Pilling RN, BSN Entered By: Deon Pilling on 01/15/2021 13:43:59 -------------------------------------------------------------------------------- Patient/Caregiver Education Details Patient Name: Date of Service: PO PE, Lum Babe 11/2/2022andnbsp1:15 PM Medical Record Number: [id number] Patient Account Number: [id number] Date of Birth/Gender: Treating RN: 1940/08/16 (80 y.o. Male) Deon Pilling Primary Care Physician: Shon Baton Other Clinician: Referring Physician: Treating Physician/Extender: Glee Arvin in Treatment: 0 Education Assessment Education Provided To: Patient and Caregiver Education Topics Provided [place]: o Handouts: Welcome T [facility] o Methods: Explain/Verbal, Printed Responses: Reinforcements needed Electronic Signature(s) Signed:  01/16/2021 6:11:26 PM By: Deon Pilling RN, BSN Entered By: Deon Pilling on 01/15/2021 14:15:49 -------------------------------------------------------------------------------- Wound Assessment Details Patient Name: Date of Service: PO PE, Lum Babe. 01/15/2021 1:15 PM Medical Record Number: [id number] Patient Account Number: [id number] Date of Birth/Sex: Treating RN: [birth year] (80 y.o. 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 Wound Open Wounding Event: Other Lesion Status: Date Acquired: 10/14/2020 Comorbid Cataracts, Glaucoma,  Hypertension, Osteoarthritis, Seizure Weeks Of Treatment: 0 History: Disorder, Received Chemotherapy, Received Radiation Clustered Wound: No Photos Wound Measurements Length: (cm) 8.5 Width: (cm) 8.9 Depth: (cm) 0.5 Area: (cm) 59.415 Volume: (cm) 29.708 % Reduction in Area: 0% % Reduction in Volume: 0% Epithelialization: None Tunneling: No Undermining: No Wound Description Classification: Full Thickness Without Exposed Support Structures Wound Margin: Distinct, outline attached Exudate Amount: Medium Exudate Type: Serosanguineous Exudate Color: red, brown Foul Odor After Cleansing: No Slough/Fibrino Yes Wound Bed Granulation Amount: Small (1-33%) Exposed Structure Granulation Quality: Pink Fascia Exposed: No Necrotic Amount: Large (67-100%) Fat Layer (Subcutaneous Tissue) Exposed: Yes Necrotic Quality: Eschar, Adherent Slough Tendon Exposed: No Muscle Exposed: No Joint Exposed: No Bone Exposed: No Treatment Notes Wound #1 (Cheek) Wound Laterality: Right Cleanser Soap and Water Discharge Instruction: May shower and wash wound with dial antibacterial soap and water prior to dressing change. Peri-Wound Care Topical Primary Dressing KerraCel Ag Gelling Fiber Dressing, 4x5 in (silver alginate) Discharge Instruction: Apply silver alginate to wound bed as instructed Secondary Dressing Woven Gauze Sponge, Non-Sterile 4x4 in Discharge Instruction: Apply over primary dressing as directed. CarboFLEX Odor Control Dressing, 4x4 in Discharge Instruction: Apply over primary dressing as directed. Secured With 38M Medipore H Soft Cloth Surgical T ape, 4 x 10 (in/yd) Discharge Instruction: Secure with tape as directed. Compression Wrap Compression Stockings Add-Ons Electronic Signature(s) Signed: 01/16/2021 6:11:26 PM By: Deon Pilling RN, BSN Entered By: Deon Pilling on 01/15/2021  13:56:59 -------------------------------------------------------------------------------- Vitals Details Patient Name: Date of Service: PO PE, Thorn J. 01/15/2021 1:15 PM Medical Record Number: [id number] Patient Account Number: [id number] Date of Birth/Sex: Treating RN: [birth year] (80 y.o. Male) Rolin Barry, [place] Primary Care Marrianne Sica: Shon Baton Other Clinician: Referring Izumi Mixon: Treating Cricket Goodlin/Extender: Glee Arvin in Treatment: 0 Vital Signs Time Taken: 13:30 Temperature (F): 98.5 Height (in): 70 Pulse (bpm): 65 Source: Stated Respiratory Rate (breaths/min): 20 Weight (lbs): 153 Blood Pressure (mmHg): 118/60 Source: Stated Reference Range: 80 - 120 mg / dl Body Mass Index (BMI): 22 Electronic Signature(s) Signed: 01/16/2021 6:11:26 PM By: Deon Pilling RN, BSN Entered By: Deon Pilling on 01/15/2021 13:36:09

## 2021-01-17 ENCOUNTER — Telehealth: Payer: Self-pay | Admitting: *Deleted

## 2021-01-17 ENCOUNTER — Other Ambulatory Visit: Payer: Self-pay | Admitting: *Deleted

## 2021-01-17 DIAGNOSIS — C8331 Diffuse large B-cell lymphoma, lymph nodes of head, face, and neck: Secondary | ICD-10-CM

## 2021-01-17 MED ORDER — LENALIDOMIDE 25 MG PO CAPS: 25.0000 mg | ORAL_CAPSULE | Freq: Every day | ORAL | 0 refills | Status: AC

## 2021-01-17 NOTE — Telephone Encounter (Signed)
Received call from pt to give me an update.  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  He continues to do his best.  He had experienced a rash while on doxycycline. Also a new start on Revlimid. He states it is no worse. It is just on his hips. He is using hydrocortisone cream (OTC) and that seems to be working. He is aware of his appts on Monday, 01/20/21

## 2021-01-20 ENCOUNTER — Inpatient Hospital Stay: Payer: Medicare Other

## 2021-01-20 ENCOUNTER — Telehealth: Payer: Self-pay

## 2021-01-20 ENCOUNTER — Inpatient Hospital Stay: Payer: Medicare Other | Attending: Hematology and Oncology

## 2021-01-20 ENCOUNTER — Inpatient Hospital Stay (HOSPITAL_BASED_OUTPATIENT_CLINIC_OR_DEPARTMENT_OTHER): Payer: Medicare Other | Admitting: Nurse Practitioner

## 2021-01-20 ENCOUNTER — Other Ambulatory Visit: Payer: Self-pay

## 2021-01-20 ENCOUNTER — Inpatient Hospital Stay (HOSPITAL_BASED_OUTPATIENT_CLINIC_OR_DEPARTMENT_OTHER): Payer: Medicare Other | Admitting: Hematology and Oncology

## 2021-01-20 VITALS — BP 115/54 | HR 62 | Resp 16

## 2021-01-20 VITALS — BP 134/56 | HR 67 | Temp 98.3°F | Resp 17 | Wt 164.6 lb

## 2021-01-20 DIAGNOSIS — G893 Neoplasm related pain (acute) (chronic): Secondary | ICD-10-CM

## 2021-01-20 DIAGNOSIS — C8251 Diffuse follicle center lymphoma, lymph nodes of head, face, and neck: Secondary | ICD-10-CM | POA: Diagnosis not present

## 2021-01-20 DIAGNOSIS — Z515 Encounter for palliative care: Secondary | ICD-10-CM | POA: Diagnosis not present

## 2021-01-20 DIAGNOSIS — Z95828 Presence of other vascular implants and grafts: Secondary | ICD-10-CM

## 2021-01-20 DIAGNOSIS — C8331 Diffuse large B-cell lymphoma, lymph nodes of head, face, and neck: Secondary | ICD-10-CM

## 2021-01-20 DIAGNOSIS — Z5112 Encounter for antineoplastic immunotherapy: Secondary | ICD-10-CM

## 2021-01-20 LAB — CMP (CANCER CENTER ONLY)
ALT: 24 U/L (ref 0–44)
AST: 23 U/L (ref 15–41)
Albumin: 2.7 g/dL — ABNORMAL LOW (ref 3.5–5.0)
Alkaline Phosphatase: 126 U/L (ref 38–126)
Anion gap: 8 (ref 5–15)
BUN: 7 mg/dL — ABNORMAL LOW (ref 8–23)
CO2: 27 mmol/L (ref 22–32)
Calcium: 8.3 mg/dL — ABNORMAL LOW (ref 8.9–10.3)
Chloride: 99 mmol/L (ref 98–111)
Creatinine: 0.68 mg/dL (ref 0.61–1.24)
GFR, Estimated: >60 mL/min (ref >60)
Glucose, Bld: 123 mg/dL — ABNORMAL HIGH (ref 70–99)
Potassium: 3.4 mmol/L — ABNORMAL LOW (ref 3.5–5.1)
Sodium: 134 mmol/L — ABNORMAL LOW (ref 135–145)
Total Bilirubin: 0.3 mg/dL (ref 0.3–1.2)
Total Protein: 6 g/dL — ABNORMAL LOW (ref 6.5–8.1)

## 2021-01-20 LAB — CBC WITH DIFFERENTIAL (CANCER CENTER ONLY)
Abs Immature Granulocytes: 0.01 10*3/uL (ref 0.00–0.07)
Basophils Absolute: 0 10*3/uL (ref 0.0–0.1)
Basophils Relative: 3 %
Eosinophils Absolute: 0.5 10*3/uL (ref 0.0–0.5)
Eosinophils Relative: 35 %
HCT: 30.5 % — ABNORMAL LOW (ref 39.0–52.0)
Hemoglobin: 10.6 g/dL — ABNORMAL LOW (ref 13.0–17.0)
Immature Granulocytes: 1 %
Lymphocytes Relative: 17 %
Lymphs Abs: 0.3 10*3/uL — ABNORMAL LOW (ref 0.7–4.0)
MCH: 31.4 pg (ref 26.0–34.0)
MCHC: 34.8 g/dL (ref 30.0–36.0)
MCV: 90.2 fL (ref 80.0–100.0)
Monocytes Absolute: 0.2 10*3/uL (ref 0.1–1.0)
Monocytes Relative: 16 %
Neutro Abs: 0.4 10*3/uL — CL (ref 1.7–7.7)
Neutrophils Relative %: 28 %
Platelet Count: 205 10*3/uL (ref 150–400)
RBC: 3.38 MIL/uL — ABNORMAL LOW (ref 4.22–5.81)
RDW: 13.8 % (ref 11.5–15.5)
WBC Count: 1.5 10*3/uL — ABNORMAL LOW (ref 4.0–10.5)
nRBC: 0 % (ref 0.0–0.2)

## 2021-01-20 LAB — LACTATE DEHYDROGENASE: LDH: 293 U/L — ABNORMAL HIGH (ref 98–192)

## 2021-01-20 MED ORDER — KETOROLAC TROMETHAMINE 30 MG/ML IJ SOLN: 30.0000 mg | Freq: Once | INTRAMUSCULAR | Status: DC

## 2021-01-20 MED ORDER — MORPHINE SULFATE ER 15 MG PO TBCR: 15.0000 mg | EXTENDED_RELEASE_TABLET | Freq: Two times a day (BID) | ORAL | 0 refills | Status: DC

## 2021-01-20 MED ORDER — HYDROMORPHONE HCL 1 MG/ML IJ SOLN: 2.0000 mg | Freq: Once | INTRAMUSCULAR | Status: DC

## 2021-01-20 MED ORDER — SODIUM CHLORIDE 0.9% FLUSH
10.0000 mL | INTRAVENOUS | Status: DC | PRN
  Administered 2021-01-20: 10 mL

## 2021-01-20 MED ORDER — HEPARIN SOD (PORK) LOCK FLUSH 100 UNIT/ML IV SOLN
500.0000 [IU] | Freq: Once | INTRAVENOUS | Status: AC | PRN
  Administered 2021-01-20: 500 [IU]

## 2021-01-20 MED ORDER — KETOROLAC TROMETHAMINE 30 MG/ML IJ SOLN
30.0000 mg | Freq: Once | INTRAMUSCULAR | Status: AC
  Administered 2021-01-20: 30 mg via INTRAVENOUS
  Filled 2021-01-20: qty 1

## 2021-01-20 MED ORDER — SODIUM CHLORIDE 0.9% FLUSH
10.0000 mL | Freq: Once | INTRAVENOUS | Status: AC
  Administered 2021-01-20: 10 mL

## 2021-01-20 MED ORDER — SODIUM CHLORIDE 0.9 % IV SOLN: Freq: Once | INTRAVENOUS | Status: AC

## 2021-01-20 MED ORDER — CELECOXIB 100 MG PO CAPS: 200.0000 mg | ORAL_CAPSULE | Freq: Two times a day (BID) | ORAL | 0 refills | Status: DC

## 2021-01-20 MED ORDER — PROCHLORPERAZINE MALEATE 10 MG PO TABS
10.0000 mg | ORAL_TABLET | Freq: Once | ORAL | Status: AC
  Administered 2021-01-20: 10 mg via ORAL
  Filled 2021-01-20: qty 1

## 2021-01-20 MED ORDER — HYDROMORPHONE HCL 1 MG/ML IJ SOLN
2.0000 mg | Freq: Once | INTRAMUSCULAR | Status: AC
  Administered 2021-01-20: 2 mg via INTRAVENOUS
  Filled 2021-01-20: qty 2

## 2021-01-20 MED ORDER — TAFASITAMAB-CXIX CHEMO INJECTION 200 MG
12.0000 mg/kg | Freq: Once | INTRAVENOUS | Status: AC
Start: 2021-01-20 — End: 2021-01-20
  Administered 2021-01-20: 1000 mg via INTRAVENOUS
  Filled 2021-01-20: qty 25

## 2021-01-20 NOTE — Progress Notes (Signed)
[place]  [phone] [phone]   Name: WINFORD HEHN Date: 01/20/2021 MRN: [id number]  DOB: 1940-04-01  Patient Care Team: Shon Baton, MD as PCP - General (Internal Medicine) Orson Slick, MD as Consulting Physician (Hematology and Oncology) Eppie Gibson, MD as Consulting Physician (Radiation Oncology) Malmfelt, Stephani Police, RN as Registered Nurse    REASON FOR CONSULTATION: Brian Castillo is a 80 y.o. male with multiple medical problems including stage III follicular lymphoma (Revlimid/Monjuvi), arthritis, GERD, glaucoma, hyperlipidemia, hypertension, and seizures.  Palliative ask to see for symptom management and goals of care.    SOCIAL HISTORY:     reports that he has quit smoking. He has never used smokeless tobacco. He reports current alcohol use of about 6.0 standard drinks per week. He reports that he does not use drugs.  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   Seizures ([place])    last seizure was in 1988    PAST SURGICAL HISTORY:  Past Surgical History:  Procedure Laterality Date   CARPOMETACARPEL SUSPENSION PLASTY Right 08/24/2014   Procedure: RIGHT CMC ARTHOPLASTY WITH DOUBLE TENDON TRANSFER AND REPAIR  RECONSTRUCTION ;  Surgeon: Roseanne Kaufman, MD;  Location: [facility];  Service: Orthopedics;  Laterality: Right;   CHOLECYSTECTOMY     COLONOSCOPY     IR IMAGING GUIDED PORT INSERTION  08/14/2020   IR RADIOLOGIST EVAL & MGMT  08/20/2020   KNEE ARTHROSCOPY  2003   bilateral   POLYPECTOMY      HEMATOLOGY/ONCOLOGY HISTORY:  Oncology History   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 ([place])  05/29/2020 Initial Diagnosis   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  MEDICATIONS:  Current Outpatient Medications  Medication Sig Dispense Refill   acetaminophen (TYLENOL) 325 MG tablet Take 2 tablets (650 mg total) by mouth every 4 (four) hours as needed for mild pain, moderate pain, fever or headache (or Fever >/= 101).     allopurinol (ZYLOPRIM) 300 MG tablet TAKE 1 TABLET BY MOUTH EVERY DAY 90 tablet 1   amLODipine (NORVASC) 5 MG tablet Take 5 mg by mouth Daily.      aspirin 81 MG tablet Take 81 mg by mouth daily. (Patient not taking: No sig reported)     Calcium Carb-Cholecalciferol (CALCIUM 600 + D PO) Take 1 tablet by mouth  daily.     cholecalciferol (VITAMIN D) 1000 units tablet Take 1,000 Units by mouth daily.     doxycycline (VIBRAMYCIN) 100 MG capsule Take 1 capsule (100 mg total) by mouth 2 (two) times daily. 20 capsule 0   gabapentin (NEURONTIN) 300 MG capsule Take 2 capsules (600 mg total) by mouth 3 (three) times daily. 90 capsule 3   HYDROmorphone (DILAUDID) 2 MG tablet Take 1 tablet (2 mg total) by mouth every 4 (four) hours as needed for severe pain. 60 tablet 0   Latanoprostene Bunod (VYZULTA) 0.024 % SOLN Place 1 drop into both eyes at bedtime.     lenalidomide (REVLIMID) 25 MG capsule  Take 1 capsule (25 mg total) by mouth daily. Take for 21 days, then none for 7 days. Repeat as ordered. [employer] Auth # [id number]; Date Obtained 01/17/21 21 capsule 0   lidocaine-prilocaine (EMLA) cream Apply 1 application topically as needed. (Patient taking differently: Apply 1 application topically daily as needed (port access).) 30 g 0   metoprolol succinate (TOPROL-XL) 25 MG 24 hr tablet Take 25 mg by mouth Daily.      phenytoin (DILANTIN) 100 MG ER capsule Take 200 mg by mouth 2 (two) times daily.     polyethylene glycol (MIRALAX / GLYCOLAX) 17 g packet Take 17 g by mouth daily. 14 each 0   rosuvastatin (CRESTOR) 20 MG tablet Take 20 mg by mouth daily.     timolol (BETIMOL) 0.5 % ophthalmic solution Place 1 drop into both eyes 2 (two) times daily.     No current facility-administered medications for this visit.   Facility-Administered Medications Ordered in Other Visits  Medication Dose Route Frequency Provider Last Rate Last Admin   0.9 %  sodium chloride infusion   Intravenous Once Ledell Peoples IV, MD       heparin lock flush 100 unit/mL  500 Units Intracatheter Once PRN Orson Slick, MD       prochlorperazine (COMPAZINE) tablet 10 mg  10 mg Oral Once Ledell Peoples IV, MD       sodium chloride flush (NS) 0.9 % injection 10 mL  10 mL Intracatheter PRN Orson Slick, MD       tafasitamab-cxix [facility]) 1,000 mg in sodium chloride 0.9 % 225 mL (4 mg/mL) chemo infusion  12 mg/kg (Treatment Plan Recorded) Intravenous Once Orson Slick, MD        VITAL SIGNS: There were no vitals taken for this visit. There were no vitals filed for this visit.  Estimated body mass index is 25.03 kg/m as calculated from the following:   Height as of 01/03/21: 5\' 8"  (1.727 m).   Weight as of an earlier encounter on 01/20/21: 164 lb 9.6 oz (74.7 kg).  LABS: CBC:    Component Value Date/Time   WBC 1.5 (L) 01/20/2021 1133   WBC 4.5 01/04/2021 1630   HGB 10.6 (L) 01/20/2021 1133   HCT 30.5  (L) 01/20/2021 1133   PLT 205 01/20/2021 1133   MCV 90.2 01/20/2021 1133   NEUTROABS 0.4 (LL) 01/20/2021 1133   LYMPHSABS 0.3 (L) 01/20/2021 1133   MONOABS 0.2 01/20/2021 1133   EOSABS 0.5 01/20/2021 1133   BASOSABS 0.0 01/20/2021 1133   Comprehensive Metabolic Panel:    Component Value Date/Time   NA 134 (L) 01/20/2021 1133   K 3.4 (L) 01/20/2021 1133   CL 99 01/20/2021 1133   CO2 27 01/20/2021 1133   BUN 7 (L) 01/20/2021 1133  CREATININE 0.68 01/20/2021 1133   GLUCOSE 123 (H) 01/20/2021 1133   CALCIUM 8.3 (L) 01/20/2021 1133   AST 23 01/20/2021 1133   ALT 24 01/20/2021 1133   ALKPHOS 126 01/20/2021 1133   BILITOT 0.3 01/20/2021 1133   PROT 6.0 (L) 01/20/2021 1133   ALBUMIN 2.7 (L) 01/20/2021 1133    RADIOGRAPHIC STUDIES: No results found.  PERFORMANCE STATUS (ECOG) : 1 - Symptomatic but completely ambulatory  Review of Systems  Constitutional:  Positive for appetite change and fatigue.  HENT:  Positive for facial swelling.   Musculoskeletal:  Positive for arthralgias.  Neurological:  Positive for weakness.  Unless otherwise noted, a complete review of systems is negative.  Physical Exam General: NAD, sitting in wheelchair  HEENT: massive right sided neck mass, extending up right side of face, mouth, and across chin, neck area. Dressing to area clean, dry, intact.  Cardiovascular: regular rate and rhythm Pulmonary: clear ant fields, normal breathing pattern Neurological: Weakness but otherwise nonfocal, AAO x3, able to speak in complete sentences  IMPRESSION:  This is my initial visit with Mr. Fleagle.  He presents to the clinic today with his wife.  He is sitting in a wheelchair.  No acute distress noted however patient complains of significant pain related to large right-sided neck/facial mass.  I introduced myself and palliative's role in collaboration with his oncology team to assist with symptom management and goals of care.  Patient and wife verbalized  understanding and appreciation of our involvement.  Patient and wife live in the home together.  They have been married for over 54 years and have 2 children.  Patient is retired from [employer].  He shares he was born in [place] as his father was in the [employer].  He enjoys reading and spending time with his wife and family.  [name] is ambulatory in the home.  Endorses a recent fall (no injuries other than a left elbow scrape).  Since fall he has been utilizing a walker in the home for safety when he feels weak.  Is able to perform most ADLs independently with assistance and occasional rest breaks due to fatigue.  He and his wife shares difficulty with nutrition due to mass causing limited mouth opening and ability to tolerate certain foods.  His diet currently consists mainly of soft foods and liquids.  He denies any complications with swallowing and is able to take all medications without complications.  We spent time discussing extensively patient's severe pain associated with right neck/facial mass.  Wife states pain was well controlled on 2 mg of Dilaudid over the past several weeks however this past Friday patient began having excruciating pain despite current regimen which was unresolved.  He has been unable to rest comfortably due to pain.  He has taken 2 mg of Dilaudid approximately every 3-4 hours around-the-clock.  He is sometimes able to go longer during the night without taking medication with a maximum timeframe of 5 hours.  He is also taken Tylenol and Neurontin occipital neuralgia.  Education provided on symptom management with a goal of providing comfort and decrease pain.  Patient and family understands we may not be able to completely eliminate pain but will focus on management to provide some quality of life and comfort.  Patient and wife verbalizes understanding and appreciation.  Education provided on the use of long-acting pain medication in addition to continuing his  Dilaudid for breakthrough pain.  Our hopes is that patient will find he has better relief  and possibly decrease the frequency of Dilaudid throughout the day.  He is hopeful to be able to enjoy his time with his wife and feel comfortable enough to do something enjoyable.  We discussed the use of fentanyl patches versus oral medications.  Patient request to initially start with oral medications and if he begins to have complications with swallowing with then be more open to considering patch.  Detailed education on use of long-acting pain medication in addition to NSAIDs.  Patient and wife aware to contact clinic immediately or call 911 with any adverse symptoms such as rash, shortness of breath, twitching, or mental status changes.  Patient also advised to discontinue any use of NSAIDs (ibuprofen/Aleve) as this is contraindicated with Celebrex.  Discussed continued bowel regimen to prevent opioid-induced constipation.  We also spent time discussing patient's nutrition and foods to consider that are comfortable and safe for him to eat.  We discussed puddings, yogurt, Jell-O, mashed potatoes, soft cooked meats, protein shakes, smoothies with added protein, and blended items.  Patient and wife verbalized understanding.  All questions answered and support provided.  PLAN: Patient will discontinue any over-the-counter NSAIDs. Continue to encourage soft foods with added protein as tolerated.  I anticipate if goals of care are to continue with aggressive interventions patient may require temporary feeding tube in the event mass continues to enlarge or invade facial area. MiraLAX daily for bowel regimen in the setting of opioid use Patient will continue on oral Dilaudid with an option of 2-4 mg every 4 hours as needed based on severity MS Contin 15 mg twice daily for long-acting pain management (calculated current use of oral Dilaudid 12 mg/day equivalent to 36 mg/day of MS Contin).  We will continue to  evaluate effectiveness and increase as needed. Celebrex 200 mg twice daily Continue Neurontin as prescribed Patient to receive IV Dilaudid and IV Toradol during infusion today. I will plan to see patient back in the clinic in 1 week to evaluate pain and symptom burden.   Patient and wife expressed understanding and was in agreement with this plan. He also understands that He can call the clinic at any time with any questions, concerns, or complaints.     Time Total: 45 min.   Visit consisted of counseling and education dealing with the complex and emotionally intense issues of symptom management and palliative care in the setting of serious and potentially life-threatening illness.Greater than 50%  of this time was spent counseling and coordinating care related to the above assessment and plan.  Signed by: Alda Lea, AGPCNP-BC Palliative Medicine Team

## 2021-01-20 NOTE — Patient Instructions (Signed)
[facility]   Discharge Instructions: Thank you for choosing [facility] to provide your oncology and hematology care.   If you have a lab appointment with the [facility], please go directly to the [facility] and check in at the registration area.   Wear comfortable clothing and clothing appropriate for easy access to any Portacath or PICC line.   We strive to give you quality time with your provider. You may need to reschedule your appointment if you arrive late (15 or more minutes).  Arriving late affects you and other patients whose appointments are after yours.  Also, if you miss three or more appointments without notifying the office, you may be dismissed from the clinic at the provider's discretion.      For prescription refill requests, have your pharmacy contact our office and allow 72 hours for refills to be completed.    Today you received the following chemotherapy and/or immunotherapy agents: tafasitamab-cxix.      To help prevent nausea and vomiting after your treatment, we encourage you to take your nausea medication as directed.  BELOW ARE SYMPTOMS THAT SHOULD BE REPORTED IMMEDIATELY: *FEVER GREATER THAN 100.4 F (38 C) OR HIGHER *CHILLS OR SWEATING *NAUSEA AND VOMITING THAT IS NOT CONTROLLED WITH YOUR NAUSEA MEDICATION *UNUSUAL SHORTNESS OF BREATH *UNUSUAL BRUISING OR BLEEDING *URINARY PROBLEMS (pain or burning when urinating, or frequent urination) *BOWEL PROBLEMS (unusual diarrhea, constipation, pain near the anus) TENDERNESS IN MOUTH AND THROAT WITH OR WITHOUT PRESENCE OF ULCERS (sore throat, sores in mouth, or a toothache) UNUSUAL RASH, SWELLING OR PAIN  UNUSUAL VAGINAL DISCHARGE OR ITCHING   Items with * indicate a potential emergency and should be followed up as soon as possible or go to the Emergency Department if any problems should occur.  Please show the CHEMOTHERAPY ALERT CARD or IMMUNOTHERAPY ALERT CARD at  check-in to the Emergency Department and triage nurse.  Should you have questions after your visit or need to cancel or reschedule your appointment, please contact [facility]  Dept: [phone]  and follow the prompts.  Office hours are 8:00 a.m. to 4:30 p.m. Monday - Friday. Please note that voicemails left after 4:00 p.m. may not be returned until the following business day.  We are closed weekends and major holidays. You have access to a nurse at all times for urgent questions. Please call the main number to the clinic Dept: [phone] and follow the prompts.   For any non-urgent questions, you may also contact your provider using MyChart. We now offer e-Visits for anyone 3 and older to request care online for non-urgent symptoms. For details visit [email or web address].   Also download the MyChart app! Go to the app store, search "MyChart", open the app, select [facility], and log in with your MyChart username and password.  Due to Covid, a mask is required upon entering the hospital/clinic. If you do not have a mask, one will be given to you upon arrival. For doctor visits, patients may have 1 support person aged 39 or older with them. For treatment visits, patients cannot have anyone with them due to current Covid guidelines and our immunocompromised population.

## 2021-01-20 NOTE — Telephone Encounter (Signed)
CRITICAL VALUE STICKER  CRITICAL VALUE: ANC - 0.4  RECEIVER (on-site recipient of call): Yetta Glassman, CMA  DATE & TIME NOTIFIED: 01/20/21 at 12:05p  MESSENGER (representative from lab): Hillary  MD NOTIFIED: Lorenso Courier  TIME OF NOTIFICATION: 01/20/21 at 12:07pm  RESPONSE: Notification given to Eldridge Scot, RN for follow-up.

## 2021-01-20 NOTE — Progress Notes (Signed)
Maintain Monjuvi dose at 1000 mg despite weight changes. 10.36% from calculated. Will re-assess with future infusions.   Benn Moulder, PharmD Pharmacy Resident  01/20/2021 1:23 PM

## 2021-01-20 NOTE — Telephone Encounter (Signed)
Oral Oncology Patient Advocate Encounter  Met patient in lobby to complete re-enrollment application for Brian Castillo in an effort to reduce patient's out of pocket expense for Revlimid to $0.    Application completed and faxed to [phone].   [facility] PA[facility] phone number for follow up is [phone].  This encounter will be updated until final determination.  [facility] Patient Brian Castillo Phone [phone] Fax [phone] 01/20/2021 5:14 PM

## 2021-01-20 NOTE — Progress Notes (Signed)
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 Lorenso Courier  4) 02/22/2020: PET CT scan showed Deauville 4 and Deauville 5 adenopathy bilaterally in the neck. There is also a Deauville 4 left supraclavicular lymph node and a Deauville 4 right gastric lymph node 5) 04/24/2020: presents with rapid enlargement of his right submandibular lymph node.  6) 05/20/2020: partial excision of right submandibular lymph node with attempted fluid drainage. Pathology consistent with follicular lymphoma, no clear evidence of transformation.  7) 06/12/2020: Cycle 1 Day 1 of R-Benda. 8) 07/10/2020: Cycle 2 Day 1 of R-Benda. 9) 08/07/2020: Cycle 3 Day 1 of R-Benda. Noted to have enlargement of right cervical lymph node on exam.  10) 08/28/2020: Cycle 1 Day 1 of R-miniCHOP 11) 09/20/2020: Cycle 2 Day 2 of R-miniCHOP 12) 10/11/2020: Cycle 3 of R-miniCHOP HELD due to progression. 13) 10/30/2020: start of palliative radiation therapy 14) 12/30/2020: Cycle 1 Day 1 of Monjuvi/Lenalidomide  Interval History:  Brian Castillo 80 y.o. 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 The patient's last visit was on  01/06/2021. In the interim since the last visit he has continued on Monjuvi with Revlimid therapy.  On exam today Brian Castillo notes he has tolerated Monjuvi therapy well so far.  He is not having any clear side effects as result of the treatment.  Unfortunately the mass on his face continues to expand and appears to not be crossing the midline under his chin.  The wound is large and ulcerated though there are no clear signs of infection.  He notes that he is struggling with pain and that it is continually worsening despite p.o. Dilaudid therapy.  He is having difficulty eating but fortunately is having no difficulty with swallowing or breathing.  His weight has remained remarkably steady despite his poor p.o. intake.  He reports that he is not having any issues with fevers, chills, sweats, nausea, vomiting or diarrhea.  A full 10 point ROS is listed below.  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   Seizures ([place])    last seizure was in 1988    SURGICAL HISTORY: Past Surgical History:  Procedure Laterality Date   CARPOMETACARPEL SUSPENSION PLASTY Right 08/24/2014   Procedure: RIGHT CMC ARTHOPLASTY WITH DOUBLE TENDON TRANSFER AND REPAIR  RECONSTRUCTION ;  Surgeon: Roseanne Kaufman, MD;  Location: [facility];  Service: Orthopedics;  Laterality: Right;   CHOLECYSTECTOMY  COLONOSCOPY     IR IMAGING GUIDED PORT INSERTION  08/14/2020   IR RADIOLOGIST EVAL & MGMT  08/20/2020   KNEE ARTHROSCOPY  2003   bilateral   POLYPECTOMY      SOCIAL HISTORY: Social History   Socioeconomic History   Marital status: Married    Spouse name: Not on file   Number of children: Not on file   Years of education: Not on file    Highest education level: Not on file  Occupational History   Not on file  Tobacco Use   Smoking status: Former   Smokeless tobacco: Never   Tobacco comments:    quit 1984  Vaping Use   Vaping Use: Never used  Substance and Sexual Activity   Alcohol use: Yes    Alcohol/week: 6.0 standard drinks    Types: 6 Glasses of wine per week   Drug use: No   Sexual activity: Not on file  Other Topics Concern   Not on file  Social History Narrative   Not on file   Social Determinants of Health   Financial Resource Strain: Not on file  Food Insecurity: Not on file  Transportation Needs: Not on file  Physical Activity: Not on file  Stress: Not on file  Social Connections: Not on file  Intimate Partner Violence: Not on file    FAMILY HISTORY: Family History  Problem Relation Age of Onset   Colon cancer Neg Hx    Colon polyps Neg Hx     ALLERGIES:  is allergic to brimonidine tartrate-timolol, lisinopril, and netarsudil dimesylate.  MEDICATIONS:  Current Outpatient Medications  Medication Sig Dispense Refill   acetaminophen (TYLENOL) 325 MG tablet Take 2 tablets (650 mg total) by mouth every 4 (four) hours as needed for mild pain, moderate pain, fever or headache (or Fever >/= 101).     allopurinol (ZYLOPRIM) 300 MG tablet TAKE 1 TABLET BY MOUTH EVERY DAY 90 tablet 1   amLODipine (NORVASC) 5 MG tablet Take 5 mg by mouth Daily.      aspirin 81 MG tablet Take 81 mg by mouth daily. (Patient not taking: No sig reported)     Calcium Carb-Cholecalciferol (CALCIUM 600 + D PO) Take 1 tablet by mouth daily.     celecoxib (CELEBREX) 100 MG capsule Take 2 capsules (200 mg total) by mouth 2 (two) times daily. 120 capsule 0   cholecalciferol (VITAMIN D) 1000 units tablet Take 1,000 Units by mouth daily.     doxycycline (VIBRAMYCIN) 100 MG capsule Take 1 capsule (100 mg total) by mouth 2 (two) times daily. 20 capsule 0   gabapentin (NEURONTIN) 300 MG capsule Take 2 capsules (600 mg total) by  mouth 3 (three) times daily. 90 capsule 3   HYDROmorphone (DILAUDID) 2 MG tablet Take 1 tablet (2 mg total) by mouth every 4 (four) hours as needed for severe pain. 60 tablet 0   Latanoprostene Bunod (VYZULTA) 0.024 % SOLN Place 1 drop into both eyes at bedtime.     lenalidomide (REVLIMID) 25 MG capsule Take 1 capsule (25 mg total) by mouth daily. Take for 21 days, then none for 7 days. Repeat as ordered. [employer] Auth # [id number]; Date Obtained 01/17/21 21 capsule 0   lidocaine-prilocaine (EMLA) cream Apply 1 application topically as needed. (Patient taking differently: Apply 1 application topically daily as needed (port access).) 30 g 0   metoprolol succinate (TOPROL-XL) 25 MG 24 hr tablet Take 25 mg by mouth Daily.  morphine (MS CONTIN) 15 MG 12 hr tablet Take 1 tablet (15 mg total) by mouth every 12 (twelve) hours. 60 tablet 0   phenytoin (DILANTIN) 100 MG ER capsule Take 200 mg by mouth 2 (two) times daily.     polyethylene glycol (MIRALAX / GLYCOLAX) 17 g packet Take 17 g by mouth daily. 14 each 0   rosuvastatin (CRESTOR) 20 MG tablet Take 20 mg by mouth daily.     timolol (BETIMOL) 0.5 % ophthalmic solution Place 1 drop into both eyes 2 (two) times daily.     No current facility-administered medications for this visit.    REVIEW OF SYSTEMS:   Constitutional: ( - ) fevers, ( - )  chills , ( - ) night sweats Eyes: ( - ) blurriness of vision, ( - ) double vision, ( - ) watery eyes Ears, nose, mouth, throat, and face: ( - ) mucositis, ( - ) sore throat Respiratory: ( - ) cough, ( - ) dyspnea, ( - ) wheezes Cardiovascular: ( - ) palpitation, ( - ) chest discomfort, ( - ) lower extremity swelling Gastrointestinal:  ( - ) nausea, ( - ) heartburn, ( - ) change in bowel habits Skin: ( - ) abnormal skin rashes Lymphatics: ( - ) new lymphadenopathy, ( - ) easy bruising Neurological: ( - ) numbness, ( - ) tingling, ( - ) new weaknesses Behavioral/Psych: ( - ) mood change, ( - ) new changes  All  other systems were reviewed with the patient and are negative.  PHYSICAL EXAMINATION: ECOG PERFORMANCE STATUS: 1 - Symptomatic but completely ambulatory  Vitals:   01/20/21 1153  BP: (!) 134/56  Pulse: 67  Resp: 17  Temp: 98.3 F (36.8 C)  SpO2: 100%    Filed Weights   01/20/21 1153  Weight: 164 lb 9.6 oz (74.7 kg)     GENERAL: well appearing elderly Caucasian male in NAD  SKIN: skin color, texture, turgor are normal, no rashes or significant lesions EYES: conjunctiva are pink and non-injected, sclera clear NECK: supple, non-tender LYMPH:  Massive right sided submandibular lymph node. Size is increasing rapidly, large confluent ulcers have developed that drain. Otherwise no other marked palpable lymphadenopathy in the cervical, axillary or supraclavicular lymph nodes.  LUNGS: clear to auscultation and percussion with normal breathing effort HEART: regular rate & rhythm and no murmurs and no lower extremity edema Musculoskeletal: no cyanosis of digits and no clubbing  PSYCH: alert & oriented x 3, fluent speech NEURO: no focal motor/sensory deficits  LABORATORY DATA:  I have reviewed the data as listed CBC Latest Ref Rng & Units 01/20/2021 01/13/2021 01/06/2021  WBC 4.0 - 10.5 K/uL 1.5(L) 4.8 5.4  Hemoglobin 13.0 - 17.0 g/dL 10.6(L) 11.7(L) 11.0(L)  Hematocrit 39.0 - 52.0 % 30.5(L) 34.5(L) 32.1(L)  Platelets 150 - 400 K/uL 205 192 188    CMP Latest Ref Rng & Units 01/20/2021 01/13/2021 01/06/2021  Glucose 70 - 99 mg/dL 123(H) 118(H) 136(H)  BUN 8 - 23 mg/dL 7(L) 23 13  Creatinine 0.61 - 1.24 mg/dL 0.68 0.77 0.63  Sodium 135 - 145 mmol/L 134(L) 136 138  Potassium 3.5 - 5.1 mmol/L 3.4(L) 3.7 3.3(L)  Chloride 98 - 111 mmol/L 99 101 102  CO2 22 - 32 mmol/L 27 25 29   Calcium 8.9 - 10.3 mg/dL 8.3(L) 8.7(L) 8.7(L)  Total Protein 6.5 - 8.1 g/dL 6.0(L) 6.2(L) 6.6  Total Bilirubin 0.3 - 1.2 mg/dL 0.3 0.4 0.3  Alkaline Phos 38 - 126 U/L 126  147(H) 84  AST 15 - 41 U/L 23 14(L) 15   ALT 0 - 44 U/L 24 15 21    RADIOGRAPHIC STUDIES: I have personally reviewed the radiological images as listed and agreed with the findings in the report: stable lymphadenopathy with exception of the rapidly enlarging submandibular lymph node.   No results found.  ASSESSMENT & PLAN Rozell J Devries 80 y.o. 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  After review the labs, the records, discussion with the patient the findings were most consistent with a stage III follicular lymphoma based on the PET CT scan.  There are lymph nodes on both sides of diaphragm and therefore stage III would be the most appropriate designation.    Previously we discussed the nature of follicular lymphoma and the treatment options moving forward.  The options at this time would be observation, immunotherapy, or combination of chemotherapy and immunotherapy.  Given the lack of symptoms, cytopenias, and bulky disease at the time of presentation we decided to proceed with observation alone. Unfortunately due to rapid progression we need to consider treatment moving forward.  Additionally we discussed rituximab monotherapy with weekly dosing x4 doses followed by every 2 months dosing x4 as a treatment option as well as bendamustine and rituximab on days 1 and 2 of a 28-day cycle x6.  We discussed the side effects, risks and benefits, and possible outcomes of treatment.  The patient voiced his understanding of these options and wished to proceed with R-Benda.   Over the period of one month the patient was found to have a rapidly enlarging right submandibular lymph node.  Due to concern for this enlarging lymph node we ordered a CT scan of the chest abdomen pelvis to assess for progression elsewhere, but that was the only lymph node that was progressing. 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   On PET CT scan from 08/23/2020 the  patient showed complete response of disease with every site with the exception of his right cervical lymph node which is increasing in size and brightness.  As such we have to discontinue first-line therapy with rituximab and Bendamustine and transition over to second line R mini CHOP.  R mini CHOP chemotherapy Cycle 1 Day1 started on 08/28/2020.  Unfortunately when he came time to proceed to cycle 3 it was evident that the lymph node in the neck had started to grow again rapidly.  Patient underwent palliative radiation therapy to the mass which also did not appear to slow its growth.  Therefore we proceeded with Monjuvi therapy in combination with lenalidomide starting on 12/30/2020.  He is currently on this therapy.  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  We are currently planning for 2nd line therapy with R-miniCHOP(which would also be effective if patient is having transformation of the tumor).  -- patient completed PET, Port, and TTE prior to start of R-miniCHOP. --prior PET CT findings are consistent with a Stage III follicular lymphoma. Due to rapidly enlarging submandibular lymph node we proceeded with treatment.  --imaging shows stable disease save for a single enlarging submandibular lymph node.  Prior biopsy has shown transformation into a more aggressive lymphoma.  --started Cycle 1 Day 1 of R-Benda chemotherapy on 06/12/2020 --completed Cycle 3 Day of R-Benda. Unfortunately he had progression for  which we changed his therapy to R-miniCHOP.  --08/28/2020 was Day 1 of Cycle 1 of R-miniCHOP. Prior to start of Cycle 3 Day 1 he was noted to have increased size of the lymph node.  Plan: -- proceed with Monjuvi +lenalidomide 25mg  PO  --today is Cycle 1 Day 22 of Monjuvi/Revlimid --appears to be poorly responding to therapy. Will discuss with experts at [facility] to see if  they would be willing to evaluate him and offer other possible treatment options.  --RTC next week for treatment and in 2 weeks to reassess in clinic  #Neutropenia, severe --WBC currently 1.5 with ANC 0.4 --continue to monitor   #Pain Control, worsening --patient is having severe pain in his midline neck up to his scalp --extensive workup including CT/MRI scans of head/neck and LP have revealed no clear etiology for his findings --pain is responding well to gabapentin 300 BID with flexeril and dilaudid at night.  --prescribed oxycodone15 mg PO q 4H PRN with ibuprofen 600mg  Q6H PRN. (Not currently taking this) --Dilaudid 2 mg p.o.q 4 hour PRN, mostly used at night.  --will make referral to in house palliative care today for assistance with pain management. Recommend addition of long acting medication.  --Continue to monitor  #Medication Interaction --Allopurinol increases the levels of Dilantin. --We will work with the patient's primary care provider Dr. Shon Baton to monitor his Dilantin levels and adjust accordingly. For now we are following this weekly.  #Fever, resolved --Single episode of fever up to 101 F occurred on 06/25/2020. --Patient was not neutropenic at that time and was evaluated emergency department with infectious work-up including chest x-ray, blood cultures, and urine culture.  Data at that time showed no evidence of infectious disease. --No further fevers noted since that time.  We will continue to monitor.  #Supportive Care --chemotherapy education completed --zofran 8mg  K0X PRN and compazine 10mg  PO q6H for nausea -- allopurinol 300mg  PO daily for TLS prophylaxis -- port in place -- pain medication as above   No orders of the defined types were placed in this encounter.  All questions were answered. The patient knows to call the clinic with any problems, questions or concerns.  A total of more than 30 minutes were spent on this encounter and over half of that  time was spent on counseling and coordination of care as outlined above.   Ledell Peoples, MD Department of Hematology/Oncology [place] [facility] Phone: [phone] Pager: [phone] Email: [email or web address]  01/21/2021 12:49 PM

## 2021-01-20 NOTE — Patient Instructions (Signed)
I have sent 2 prescriptions to your pharmacy. If you are taking any form of NSAIDS (ibuprofen/aleve) please discontinue use. We will plan to follow-up on next week and evaluate pain making any adjustments as necessary.  Continue soft food diet. Would suggest puddings, applesauce, jello, smoothies with added protein powder, mashed potatoes, eggs, soups, etc.   Please do not hesitate to call if you have questions or need prior to our next appointment. I hope this begins to provide you additional relief and comfort.

## 2021-01-21 ENCOUNTER — Encounter: Payer: Self-pay | Admitting: Nurse Practitioner

## 2021-01-21 ENCOUNTER — Telehealth: Payer: Self-pay

## 2021-01-21 ENCOUNTER — Encounter: Payer: Self-pay | Admitting: Hematology and Oncology

## 2021-01-21 LAB — PHENYTOIN LEVEL, FREE AND TOTAL
Phenytoin, Free: 1.8 ug/mL (ref 1.0–2.0)
Phenytoin, Total: 13.4 ug/mL (ref 10.0–20.0)

## 2021-01-21 NOTE — Progress Notes (Signed)
I spoke with Mr. Sidman and his wife via speakerphone. Mrs. Shad called as she was concerned patient is having significant pain to the right side of his face and mouth which is also causing some pressure and headache. At the time the pain was so severe he did not want to speak or call to the office to keep from agitating area with movement.   Mr Kroeker has started MS Contin. He took his first dose this morning and tolerated well. His pain has been manageable throughout the day to the point he only required 2mg  of Dilaudid at 8am and 1125am. He began having increased pain which he describes as more severe which started in his lower jaw/tooth area and begin to radiate across the right side of his face. He took another Dilaudid at 330 and after minimal relief he took an additional tablet at 410. He is still having pain but it has eased off since taking the additional tablet. Denies dizziness. Temp of 99.5.   I advised patient to take 4mg  of Dilaudid around the clock every 3hrs for the next 24 hours wit plans for me to call first thing in the morning to see how he did overnight. He is hopeful by taking the 2 tablets this will provide relief as he is concerned about having to go to the ED with long waits.   Patient and wife verbalized understanding of plan for medication regimen. May consider increasing MS Contin to aid in better pain control if necessary. I did approach discussions of possibly coming into clinic for IV pain medication and if unable to effectively manage outpatient having to be admitted to the hospital to gain better pain control and regimen. He and wife are hopeful it will not come to this however are open to options that will provide him with some comfort and relief as needed.   All questions answered and support provided. I will plan to reach out to patient tomorrow morning for follow-up.   Alda Lea, NP Palliative

## 2021-01-21 NOTE — Telephone Encounter (Signed)
[name] wife, Brian Castillo, called and asked about getting Brian Castillo a walker to assist with ambulation at home. [name], NP notified and order placed in [place] with [facility]. I also followed up with Brian Castillo about Mr. Stockman's pain medication. She stated that it was helping, especially when taken during the night so that he isn't waking up in pain. All questions were answered. Understanding verbalized. Instructed Brian Castillo to call back with any questions or concerns.

## 2021-01-22 ENCOUNTER — Inpatient Hospital Stay: Payer: Medicare Other

## 2021-01-22 ENCOUNTER — Inpatient Hospital Stay (HOSPITAL_BASED_OUTPATIENT_CLINIC_OR_DEPARTMENT_OTHER): Payer: Medicare Other | Admitting: Nurse Practitioner

## 2021-01-22 ENCOUNTER — Encounter (HOSPITAL_COMMUNITY): Payer: Self-pay | Admitting: Internal Medicine

## 2021-01-22 ENCOUNTER — Other Ambulatory Visit: Payer: Self-pay

## 2021-01-22 ENCOUNTER — Telehealth: Payer: Self-pay | Admitting: *Deleted

## 2021-01-22 ENCOUNTER — Inpatient Hospital Stay (HOSPITAL_COMMUNITY)
Admission: AD | Admit: 2021-01-22 | Discharge: 2021-02-13 | DRG: 842 | Disposition: E | Payer: Medicare Other | Source: Ambulatory Visit | Attending: Internal Medicine | Admitting: Internal Medicine

## 2021-01-22 VITALS — BP 120/48 | HR 74 | Temp 98.5°F | Resp 17

## 2021-01-22 DIAGNOSIS — H409 Unspecified glaucoma: Secondary | ICD-10-CM | POA: Diagnosis present

## 2021-01-22 DIAGNOSIS — R531 Weakness: Secondary | ICD-10-CM

## 2021-01-22 DIAGNOSIS — G40909 Epilepsy, unspecified, not intractable, without status epilepticus: Secondary | ICD-10-CM

## 2021-01-22 DIAGNOSIS — R451 Restlessness and agitation: Secondary | ICD-10-CM | POA: Diagnosis present

## 2021-01-22 DIAGNOSIS — Z7982 Long term (current) use of aspirin: Secondary | ICD-10-CM | POA: Diagnosis not present

## 2021-01-22 DIAGNOSIS — C8251 Diffuse follicle center lymphoma, lymph nodes of head, face, and neck: Principal | ICD-10-CM | POA: Diagnosis present

## 2021-01-22 DIAGNOSIS — G893 Neoplasm related pain (acute) (chronic): Secondary | ICD-10-CM | POA: Diagnosis present

## 2021-01-22 DIAGNOSIS — R54 Age-related physical debility: Secondary | ICD-10-CM | POA: Diagnosis present

## 2021-01-22 DIAGNOSIS — F419 Anxiety disorder, unspecified: Secondary | ICD-10-CM | POA: Diagnosis present

## 2021-01-22 DIAGNOSIS — Z87891 Personal history of nicotine dependence: Secondary | ICD-10-CM | POA: Diagnosis not present

## 2021-01-22 DIAGNOSIS — E785 Hyperlipidemia, unspecified: Secondary | ICD-10-CM | POA: Diagnosis present

## 2021-01-22 DIAGNOSIS — K219 Gastro-esophageal reflux disease without esophagitis: Secondary | ICD-10-CM | POA: Diagnosis present

## 2021-01-22 DIAGNOSIS — M19041 Primary osteoarthritis, right hand: Secondary | ICD-10-CM | POA: Diagnosis present

## 2021-01-22 DIAGNOSIS — R0682 Tachypnea, not elsewhere classified: Secondary | ICD-10-CM | POA: Diagnosis not present

## 2021-01-22 DIAGNOSIS — H04129 Dry eye syndrome of unspecified lacrimal gland: Secondary | ICD-10-CM | POA: Diagnosis present

## 2021-01-22 DIAGNOSIS — Z20822 Contact with and (suspected) exposure to covid-19: Secondary | ICD-10-CM | POA: Diagnosis present

## 2021-01-22 DIAGNOSIS — Z66 Do not resuscitate: Secondary | ICD-10-CM | POA: Diagnosis present

## 2021-01-22 DIAGNOSIS — D708 Other neutropenia: Secondary | ICD-10-CM | POA: Diagnosis present

## 2021-01-22 DIAGNOSIS — Z9221 Personal history of antineoplastic chemotherapy: Secondary | ICD-10-CM

## 2021-01-22 DIAGNOSIS — M542 Cervicalgia: Secondary | ICD-10-CM | POA: Diagnosis present

## 2021-01-22 DIAGNOSIS — R221 Localized swelling, mass and lump, neck: Secondary | ICD-10-CM | POA: Diagnosis present

## 2021-01-22 DIAGNOSIS — Z79899 Other long term (current) drug therapy: Secondary | ICD-10-CM

## 2021-01-22 DIAGNOSIS — R61 Generalized hyperhidrosis: Secondary | ICD-10-CM | POA: Diagnosis present

## 2021-01-22 DIAGNOSIS — Z515 Encounter for palliative care: Secondary | ICD-10-CM | POA: Diagnosis not present

## 2021-01-22 DIAGNOSIS — C8330 Diffuse large B-cell lymphoma, unspecified site: Secondary | ICD-10-CM | POA: Diagnosis present

## 2021-01-22 DIAGNOSIS — I10 Essential (primary) hypertension: Secondary | ICD-10-CM | POA: Diagnosis present

## 2021-01-22 DIAGNOSIS — C8331 Diffuse large B-cell lymphoma, lymph nodes of head, face, and neck: Secondary | ICD-10-CM | POA: Diagnosis not present

## 2021-01-22 DIAGNOSIS — D630 Anemia in neoplastic disease: Secondary | ICD-10-CM | POA: Diagnosis present

## 2021-01-22 DIAGNOSIS — Z888 Allergy status to other drugs, medicaments and biological substances status: Secondary | ICD-10-CM | POA: Diagnosis not present

## 2021-01-22 DIAGNOSIS — Z7189 Other specified counseling: Secondary | ICD-10-CM | POA: Diagnosis not present

## 2021-01-22 DIAGNOSIS — Z6824 Body mass index (BMI) 24.0-24.9, adult: Secondary | ICD-10-CM

## 2021-01-22 LAB — CBC WITH DIFFERENTIAL/PLATELET
Abs Immature Granulocytes: 0.04 10*3/uL (ref 0.00–0.07)
Basophils Absolute: 0.1 10*3/uL (ref 0.0–0.1)
Basophils Relative: 4 %
Eosinophils Absolute: 0.4 10*3/uL (ref 0.0–0.5)
Eosinophils Relative: 30 %
HCT: 27.1 % — ABNORMAL LOW (ref 39.0–52.0)
Hemoglobin: 9.1 g/dL — ABNORMAL LOW (ref 13.0–17.0)
Immature Granulocytes: 3 %
Lymphocytes Relative: 24 %
Lymphs Abs: 0.3 10*3/uL — ABNORMAL LOW (ref 0.7–4.0)
MCH: 31.6 pg (ref 26.0–34.0)
MCHC: 33.6 g/dL (ref 30.0–36.0)
MCV: 94.1 fL (ref 80.0–100.0)
Monocytes Absolute: 0.3 10*3/uL (ref 0.1–1.0)
Monocytes Relative: 23 %
Neutro Abs: 0.2 10*3/uL — CL (ref 1.7–7.7)
Neutrophils Relative %: 16 %
Platelets: 191 10*3/uL (ref 150–400)
RBC: 2.88 MIL/uL — ABNORMAL LOW (ref 4.22–5.81)
RDW: 13.8 % (ref 11.5–15.5)
WBC: 1.2 10*3/uL — CL (ref 4.0–10.5)
nRBC: 0 % (ref 0.0–0.2)

## 2021-01-22 LAB — LACTATE DEHYDROGENASE: LDH: 223 U/L — ABNORMAL HIGH (ref 98–192)

## 2021-01-22 LAB — PHOSPHORUS: Phosphorus: 3 mg/dL (ref 2.5–4.6)

## 2021-01-22 LAB — RESP PANEL BY RT-PCR (FLU A&B, COVID) ARPGX2
Influenza A by PCR: NEGATIVE
Influenza B by PCR: NEGATIVE
SARS Coronavirus 2 by RT PCR: NEGATIVE

## 2021-01-22 LAB — COMPREHENSIVE METABOLIC PANEL
ALT: 45 U/L — ABNORMAL HIGH (ref 0–44)
AST: 56 U/L — ABNORMAL HIGH (ref 15–41)
Albumin: 2.6 g/dL — ABNORMAL LOW (ref 3.5–5.0)
Alkaline Phosphatase: 89 U/L (ref 38–126)
Anion gap: 7 (ref 5–15)
BUN: 7 mg/dL — ABNORMAL LOW (ref 8–23)
CO2: 26 mmol/L (ref 22–32)
Calcium: 8 mg/dL — ABNORMAL LOW (ref 8.9–10.3)
Chloride: 102 mmol/L (ref 98–111)
Creatinine, Ser: 0.57 mg/dL — ABNORMAL LOW (ref 0.61–1.24)
GFR, Estimated: >60 mL/min (ref >60)
Glucose, Bld: 105 mg/dL — ABNORMAL HIGH (ref 70–99)
Potassium: 3.4 mmol/L — ABNORMAL LOW (ref 3.5–5.1)
Sodium: 135 mmol/L (ref 135–145)
Total Bilirubin: 0.6 mg/dL (ref 0.3–1.2)
Total Protein: 5.4 g/dL — ABNORMAL LOW (ref 6.5–8.1)

## 2021-01-22 LAB — MAGNESIUM: Magnesium: 1.9 mg/dL (ref 1.7–2.4)

## 2021-01-22 MED ORDER — POLYETHYLENE GLYCOL 3350 17 G PO PACK
17.0000 g | PACK | Freq: Every day | ORAL | Status: DC
  Administered 2021-01-23: 17 g via ORAL
  Filled 2021-01-22 (×2): qty 1

## 2021-01-22 MED ORDER — ONDANSETRON HCL 4 MG PO TABS: 4.0000 mg | ORAL_TABLET | Freq: Four times a day (QID) | ORAL | Status: DC | PRN

## 2021-01-22 MED ORDER — ACETAMINOPHEN 325 MG PO TABS: 650.0000 mg | ORAL_TABLET | Freq: Four times a day (QID) | ORAL | Status: DC | PRN

## 2021-01-22 MED ORDER — METOPROLOL SUCCINATE ER 25 MG PO TB24: 25.0000 mg | ORAL_TABLET | Freq: Every day | ORAL | Status: DC

## 2021-01-22 MED ORDER — HYDROMORPHONE HCL 1 MG/ML IJ SOLN
1.0000 mg | INTRAMUSCULAR | Status: DC | PRN
  Administered 2021-01-22: 1 mg via INTRAVENOUS
  Filled 2021-01-22: qty 1

## 2021-01-22 MED ORDER — HYDROMORPHONE HCL 1 MG/ML IJ SOLN
2.0000 mg | Freq: Once | INTRAMUSCULAR | Status: AC
  Administered 2021-01-22: 2 mg via INTRAVENOUS
  Filled 2021-01-22: qty 2

## 2021-01-22 MED ORDER — POTASSIUM CHLORIDE 10 MEQ/100ML IV SOLN
10.0000 meq | INTRAVENOUS | Status: AC
  Administered 2021-01-22 – 2021-01-23 (×4): 10 meq via INTRAVENOUS
  Filled 2021-01-22 (×4): qty 100

## 2021-01-22 MED ORDER — TIMOLOL HEMIHYDRATE 0.5 % OP SOLN
1.0000 [drp] | Freq: Two times a day (BID) | OPHTHALMIC | Status: DC
Start: 2021-01-22 — End: 2021-01-27
  Administered 2021-01-23 – 2021-01-26 (×6): 1 [drp] via OPHTHALMIC

## 2021-01-22 MED ORDER — SODIUM CHLORIDE 0.9 % IV SOLN: INTRAVENOUS | Status: DC

## 2021-01-22 MED ORDER — ONDANSETRON HCL 4 MG/2ML IJ SOLN: 4.0000 mg | Freq: Four times a day (QID) | INTRAMUSCULAR | Status: DC | PRN

## 2021-01-22 MED ORDER — PHENYTOIN SODIUM EXTENDED 100 MG PO CAPS
200.0000 mg | ORAL_CAPSULE | Freq: Two times a day (BID) | ORAL | Status: DC
  Administered 2021-01-22 – 2021-01-26 (×8): 200 mg via ORAL
  Filled 2021-01-22 (×8): qty 2

## 2021-01-22 MED ORDER — MORPHINE SULFATE ER 15 MG PO TBCR
15.0000 mg | EXTENDED_RELEASE_TABLET | Freq: Two times a day (BID) | ORAL | Status: DC
  Administered 2021-01-22 – 2021-01-23 (×3): 15 mg via ORAL
  Filled 2021-01-22 (×3): qty 1

## 2021-01-22 MED ORDER — ACETAMINOPHEN 650 MG RE SUPP
650.0000 mg | Freq: Four times a day (QID) | RECTAL | Status: DC | PRN
  Administered 2021-01-26: 650 mg via RECTAL
  Filled 2021-01-22: qty 1

## 2021-01-22 MED ORDER — HYDROMORPHONE HCL 1 MG/ML IJ SOLN: 2.0000 mg | Freq: Once | INTRAMUSCULAR | Status: DC

## 2021-01-22 MED ORDER — ASPIRIN EC 81 MG PO TBEC
81.0000 mg | DELAYED_RELEASE_TABLET | Freq: Every day | ORAL | Status: DC
  Administered 2021-01-23: 81 mg via ORAL
  Filled 2021-01-22: qty 1

## 2021-01-22 MED ORDER — LATANOPROSTENE BUNOD 0.024 % OP SOLN
1.0000 [drp] | Freq: Every day | OPHTHALMIC | Status: DC
Start: 2021-01-22 — End: 2021-01-27
  Administered 2021-01-22 – 2021-01-26 (×5): 1 [drp] via OPHTHALMIC

## 2021-01-22 MED ORDER — DEXAMETHASONE SODIUM PHOSPHATE 4 MG/ML IJ SOLN
4.0000 mg | Freq: Two times a day (BID) | INTRAMUSCULAR | Status: DC
  Administered 2021-01-22 – 2021-01-26 (×9): 4 mg via INTRAVENOUS
  Filled 2021-01-22 (×10): qty 1

## 2021-01-22 MED ORDER — SODIUM CHLORIDE 0.9 % IV SOLN: Freq: Once | INTRAVENOUS | Status: AC

## 2021-01-22 MED ORDER — ROSUVASTATIN CALCIUM 20 MG PO TABS
20.0000 mg | ORAL_TABLET | Freq: Every day | ORAL | Status: DC
  Administered 2021-01-23: 20 mg via ORAL
  Filled 2021-01-22: qty 1

## 2021-01-22 MED ORDER — ALLOPURINOL 300 MG PO TABS
300.0000 mg | ORAL_TABLET | Freq: Every day | ORAL | Status: DC
  Administered 2021-01-23 – 2021-01-26 (×4): 300 mg via ORAL
  Filled 2021-01-22 (×4): qty 1

## 2021-01-22 MED ORDER — HYDROMORPHONE HCL 1 MG/ML IJ SOLN
1.0000 mg | INTRAMUSCULAR | Status: DC | PRN
  Administered 2021-01-22 – 2021-01-23 (×4): 2 mg via INTRAVENOUS
  Filled 2021-01-22 (×4): qty 2

## 2021-01-22 MED ORDER — CELECOXIB 200 MG PO CAPS
200.0000 mg | ORAL_CAPSULE | Freq: Two times a day (BID) | ORAL | Status: DC
  Filled 2021-01-22: qty 1

## 2021-01-22 MED ORDER — GABAPENTIN 300 MG PO CAPS
600.0000 mg | ORAL_CAPSULE | Freq: Three times a day (TID) | ORAL | Status: DC
  Administered 2021-01-22 – 2021-01-26 (×12): 600 mg via ORAL
  Filled 2021-01-22 (×12): qty 2

## 2021-01-22 MED ORDER — ENOXAPARIN SODIUM 40 MG/0.4ML IJ SOSY
40.0000 mg | PREFILLED_SYRINGE | INTRAMUSCULAR | Status: DC
  Administered 2021-01-22: 40 mg via SUBCUTANEOUS

## 2021-01-22 NOTE — H&P (Signed)
History and Physical    Brian Castillo SKA:[id number] DOB: November 23, 1940 DOA: 01/16/2021  PCP: Shon Baton, MD  Patient coming from: home  I have personally briefly reviewed patient's old medical records in [place]  Chief Complaint: neck pain  HPI: Brian Castillo is a 80 y.o. 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  Patient has uncontrolled pain from this mass.  He had been referred by his oncologist to palliative care for assistance in pain management, but pain has been difficult to manage.  Due to worsening symptoms, decreased p.o. intake, he was referred for inpatient management.  Patient feels that overall swelling in his right face/neck has progressively gotten worse over the past 2 weeks.  He has not had any fever.  He has noticed some bloody drainage from ulcer right neck over the past 2 weeks.  He has noticed some increasing difficulty swallowing his food.  He is eating very small amounts of chopped up foods.  He has been feeling more short of breath over the past 2 weeks.  He has not had any cough or fever.  He has no nausea or vomiting.  He has been increasingly weak.  Pain medications recently adjusted.  He was started on MS Contin and Dilaudid was increased from 2 mg to 4 mg every 4 hours.  Despite these changes, reports that his pain is uncontrolled.  Review of Systems: As per HPI otherwise 10 point review of systems negative.    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 He has never used smokeless tobacco. He reports current alcohol use of about 6.0 standard drinks per week. He reports that he does not use drugs.  Allergies  Allergen Reactions   Brimonidine Tartrate-Timolol Other (See Comments)    Other reaction(s): Swelling in eyes   Lisinopril Hives and Other (See Comments)    Other reaction(s): swelling of lips (undetermined)   Netarsudil Dimesylate Other (See Comments)    Other reaction(s): Swelliing in eyes    Family History  Problem Relation Age of Onset   Colon cancer Neg Hx    Colon polyps Neg Hx     Prior to Admission medications   Medication Sig Start Date End Date Taking? Authorizing Provider  acetaminophen (TYLENOL) 325 MG tablet Take 2 tablets (650 mg total) by mouth every 4 (  four) hours as needed for mild pain, moderate pain, fever or headache (or Fever >/= 101). 12/19/20   Arrien, Jimmy Picket, MD  allopurinol (ZYLOPRIM) 300 MG tablet TAKE 1 TABLET BY MOUTH EVERY DAY 12/13/20   Orson Slick, MD  aspirin 81 MG tablet Take 81 mg by mouth daily. Patient not taking: No sig reported    [provider]  Calcium Carb-Cholecalciferol (CALCIUM 600 + D PO) Take 1 tablet by mouth daily.    [provider]  celecoxib (CELEBREX) 100 MG capsule Take 2 capsules (200 mg total) by mouth 2 (two) times daily. 01/20/21   Pickenpack-Cousar, Carlena Sax, NP  cholecalciferol (VITAMIN D) 1000 units tablet Take 1,000 Units by mouth daily.     [provider]  doxycycline (VIBRAMYCIN) 100 MG capsule Take 1 capsule (100 mg total) by mouth 2 (two) times daily. 01/04/21   Truddie Hidden, MD  gabapentin (NEURONTIN) 300 MG capsule Take 2 capsules (600 mg total) by mouth 3 (three) times daily. 01/15/21   Orson Slick, MD  HYDROmorphone (DILAUDID) 2 MG tablet Take 1 tablet (2 mg total) by mouth every 4 (four) hours as needed for severe pain. 01/15/21   Orson Slick, MD  Latanoprostene Bunod (VYZULTA) 0.024 % SOLN Place 1 drop into both eyes at bedtime.    [provider]  lenalidomide (REVLIMID) 25 MG capsule Take 1 capsule (25 mg total) by mouth daily. Take for 21 days, then none for 7 days. Repeat as ordered. [employer] Auth # [id number]; Date Obtained 01/17/21 01/17/21   Orson Slick, MD  lidocaine-prilocaine (EMLA) cream Apply 1 application topically as needed. Patient taking differently: Apply 1 application topically daily as needed (port access). 08/28/20   Orson Slick, MD  metoprolol succinate (TOPROL-XL) 25 MG 24 hr tablet Take 25 mg by mouth Daily.  11/28/11   [provider]  morphine (MS CONTIN) 15 MG 12 hr tablet Take 1 tablet (15 mg total) by mouth every 12 (twelve) hours. 01/20/21   Pickenpack-Cousar, Carlena Sax, NP  phenytoin (DILANTIN) 100 MG ER capsule Take 200 mg by mouth 2 (two) times daily.    [provider]  polyethylene glycol (MIRALAX / GLYCOLAX) 17 g packet Take 17 g by mouth daily. 12/19/20   Arrien, Jimmy Picket, MD  rosuvastatin (CRESTOR) 20 MG tablet Take 20 mg by mouth daily.    [provider]  timolol (BETIMOL) 0.5 % ophthalmic solution Place 1 drop into both eyes 2 (two) times daily.    [provider]    Physical Exam: Vitals:   01/29/2021 1442 01/26/2021 1544  BP: (!) 151/61   Pulse: 70   Resp: 18   Temp: 97.7 F (36.5 C)   TempSrc: Oral   SpO2: 97%   Height:  5\' 8"  (1.727 m)    Constitutional: NAD, calm, comfortable Eyes: PERRL, lids  and conjunctivae normal ENMT: Mucous membranes are dry. Posterior pharynx clear of any exudate or lesions.Normal dentition. Difficulty opening mouth Neck: see photo of neck mass/ulceration as below Respiratory: clear to auscultation bilaterally, no wheezing, no crackles. Normal respiratory effort. No accessory muscle use.  Cardiovascular: Regular rate and rhythm, no murmurs / rubs / gallops. No extremity edema. 2+ pedal pulses. No carotid bruits.  Abdomen: no tenderness, no masses palpated. No hepatosplenomegaly. Bowel sounds positive.  Musculoskeletal: no clubbing / cyanosis. No joint deformity upper and lower extremities. Good ROM, no contractures. Normal muscle tone.  Skin: large  necrotic ulcer on right neck with surrounding erythema Neurologic: CN 2-12 grossly intact. Sensation intact, DTR normal. Strength 5/5 in all 4.  Psychiatric: Normal judgment and insight. Alert and oriented x 3. Normal mood.         Labs on Admission: I have personally reviewed following labs and imaging studies  CBC: Recent Labs  Lab 01/20/21 1133  WBC 1.5*  NEUTROABS 0.4*  HGB 10.6*  HCT 30.5*  MCV 90.2  PLT 510   Basic Metabolic Panel: Recent Labs  Lab 01/20/21 1133  NA 134*  K 3.4*  CL 99  CO2 27  GLUCOSE 123*  BUN 7*  CREATININE 0.68  CALCIUM 8.3*   GFR: Estimated Creatinine Clearance: 68.9 mL/min (by C-G formula based on SCr of 0.68 mg/dL). Liver Function Tests: Recent Labs  Lab 01/20/21 1133  AST 23  ALT 24  ALKPHOS 126  BILITOT 0.3  PROT 6.0*  ALBUMIN 2.7*   No results for input(s): LIPASE, AMYLASE in the last 168 hours. No results for input(s): AMMONIA in the last 168 hours. Coagulation Profile: No results for input(s): INR, PROTIME in the last 168 hours. Cardiac Enzymes: No results for input(s): CKTOTAL, CKMB, CKMBINDEX, TROPONINI in the last 168 hours. BNP (last 3 results) No results for input(s): PROBNP in the last 8760 hours. HbA1C: No results for input(s):  HGBA1C in the last 72 hours. CBG: No results for input(s): GLUCAP in the last 168 hours. Lipid Profile: No results for input(s): CHOL, HDL, LDLCALC, TRIG, CHOLHDL, LDLDIRECT in the last 72 hours. Thyroid Function Tests: No results for input(s): TSH, T4TOTAL, FREET4, T3FREE, THYROIDAB in the last 72 hours. Anemia Panel: No results for input(s): VITAMINB12, FOLATE, FERRITIN, TIBC, IRON, RETICCTPCT in the last 72 hours. Urine analysis:    Component Value Date/Time   COLORURINE YELLOW 06/25/2020 1950   APPEARANCEUR CLEAR 06/25/2020 1950   LABSPEC 1.018 06/25/2020 1950   PHURINE 5.5 06/25/2020 1950   GLUCOSEU NEGATIVE 06/25/2020 1950   HGBUR LARGE (A) 06/25/2020 1950   BILIRUBINUR NEGATIVE 06/25/2020 1950   KETONESUR NEGATIVE 06/25/2020 1950   PROTEINUR TRACE (A) 06/25/2020 1950   UROBILINOGEN 0.2 03/06/2016 1251   NITRITE NEGATIVE 06/25/2020 1950   LEUKOCYTESUR NEGATIVE 06/25/2020 1950    Radiological Exams on Admission: No results found.   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 -Discussed with Dr. Lorenso Courier who will follow in consult, repeat CT neck has been ordered -We will start on IV Decadron for now for any element of edema/swelling -His necrotic mass does not appear to be actively infected -Labs are currently pending and if he has elevated WBC count, would have low threshold to start on antibiotics with surrounding erythema over neck (although patient reports that surrounding erythema may be more of a chronic finding) -Review of oncology notes indicate that further  treatment options may be limited, but will defer further discussions regarding treatment to oncology -We will start on pured diet for now for patient's comfort since he is having difficulty chewing/swallowing more solid food  Intractable neck pain -Continue on home dose of MS  Contin -We will change Dilaudid to IV -Continue home dose of gabapentin -Reports that he was recently prescribed Celebrex, but was unable to start this as an outpatient due to insurance issues.  We will start Celebrex while in house -Palliative care consult requested to help with further pain management  Hypertension -Reports that he recently stopped taking amlodipine -We will continue his home dose of metoprolol for now  Hyperlipidemia -Continue statin  Seizure disorder -Continue on home dose of Dilantin  Goals of care -With his progressing neck mass, that does not appear to be responsive to treatment, it appears that his long-term prognosis appears to be poor -I briefly brought up CODE STATUS with patient who initially requested DNR, but his wife requested that he be full code.  I think him and his family would benefit from further discussions around the realities of CPR and other heroic measures and that would likely not be helpful to improve his overall quality of life.  I think DNR would be appropriate.  Discussed with Dr. Lorenso Courier who also agrees. -I also briefly brought up other interventions such as PEG tube feeding and tracheostomy if needed.  With his expanding neck mass, both of these interventions may possibly become a reality, sooner rather than later. -Palliative care following and will also further address goals of care with patient and family  DVT prophylaxis: lovenox  Code Status: full code  Family Communication: discussed with wife at the bedside  Disposition Plan: pending hospital course  Consults called: oncology, palliative care  Admission status: inpatient, medsurg   Kathie Dike MD [facility]   If 7PM-7AM, please contact night-coverage [email or web address]   02/06/2021, 4:53 PM

## 2021-01-22 NOTE — Telephone Encounter (Signed)
Received vm message from pt requesting immediate call back. Called him back.  His voice is quite weak. He states his pain remains uncontrolled and he cannot open his mouth enough to eat or drink.  He feels awful. He is requesting to come in.  Spoke with [place], [place] in [place] and she states she will see him this morning as soon as he can get here. Pt in agreement and will be coming in with his wife.   Dr. Lorenso Courier made aware.

## 2021-01-22 NOTE — Progress Notes (Signed)
[facility]  [phone] [phone]   Name: Brian Castillo Date: 02/11/2021 MRN: [id number]  DOB: [birth year]  Patient Care Team: Shon Baton, MD as PCP - General (Internal Medicine) Orson Slick, MD as Consulting Physician (Hematology and Oncology) Eppie Gibson, MD as Consulting Physician (Radiation Oncology) Mal[place]elt, Stephani Police, RN as Registered Nurse Pickenpack-Cousar, Carlena Sax, NP as Nurse Practitioner (Nurse Practitioner)    Brian Castillo is a 80 y.o. male with multiple medical problems including stage III follicular lymphoma (Revlimid/Monjuvi), arthritis, GERD, glaucoma, hyperlipidemia, hypertension, and seizures.  Palliative ask to see for symptom management and goals of care.      SOCIAL HISTORY:     reports that he has quit smoking. He has never used smokeless tobacco. He reports current alcohol use of about 6.0 standard drinks per week. He reports that he does not use drugs.  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   Seizures ([place])    last seizure was in 1988    PAST SURGICAL HISTORY:  Past Surgical History:  Procedure Laterality Date   CARPOMETACARPEL SUSPENSION PLASTY Right 08/24/2014   Procedure: RIGHT CMC ARTHOPLASTY WITH DOUBLE TENDON TRANSFER AND REPAIR  RECONSTRUCTION ;  Surgeon: Roseanne Kaufman, MD;  Location: [facility];  Service: Orthopedics;  Laterality: Right;   CHOLECYSTECTOMY     COLONOSCOPY     IR IMAGING GUIDED PORT INSERTION  08/14/2020   IR RADIOLOGIST EVAL & MGMT  08/20/2020   KNEE ARTHROSCOPY  2003   bilateral   POLYPECTOMY       HEMATOLOGY/ONCOLOGY HISTORY:  Oncology History  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 ([place])  05/29/2020 Initial Diagnosis   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 ([place])   05/29/2020 Cancer Staging   Staging form: Hodgkin and Non-Hodgkin Lymphoma, AJCC 8th Edition - Clinical: Stage III (Follicular lymphoma) - Signed by Orson Slick, MD on 05/29/2020 Stage prefix: Initial diagnosis    06/12/2020 - 08/08/2020 Chemotherapy          08/28/2020 - 09/20/2020 Chemotherapy          12/30/2020 -  Chemotherapy   Patient is on Treatment Plan : NON-HODGKIN'S LYMPHOMA DLBCL RELAPSED/REFRACTORY Tafasitamab-cxix + Lenalidomide q28d / Tafasitamab-cxix Maintenance q28d       ALLERGIES:  is allergic to brimonidine tartrate-timolol, lisinopril, and netarsudil dimesylate.  MEDICATIONS:  No current facility-administered medications for this visit.   No current outpatient medications on file.   Facility-Administered Medications Ordered in Other Visits  Medication Dose Route Frequency Provider Last Rate Last Admin   0.9 %  sodium chloride infusion   Intravenous Continuous Pickenpack-Cousar, Macio Kissoon N, NP 50 mL/hr at 01/21/2021 1347 New Bag at 01/21/2021 1347   HYDROmorphone (DILAUDID) injection 1 mg  1 mg Intravenous Q4H PRN Kathie Dike, MD        VITAL SIGNS: BP (!) 120/48 (BP Location: Left Arm, Patient Position: Supine)   Pulse 74   Temp  98.5 F (36.9 C) (Oral)   Resp 17   SpO2 99%  There were no vitals filed for this visit.  Estimated body mass index is 25.03 kg/m as calculated from the following:   Height as of an earlier encounter on 01/27/2021: 5\' 8"  (1.727 m).   Weight as of 01/20/21: 164 lb 9.6 oz (74.7 kg).  LABS: CBC:    Component Value Date/Time   WBC 1.5 (L) 01/20/2021 1133   WBC 4.5 01/04/2021 1630   HGB 10.6 (L) 01/20/2021 1133   HCT 30.5 (L) 01/20/2021 1133   PLT 205 01/20/2021 1133   MCV 90.2 01/20/2021 1133   NEUTROABS 0.4  (LL) 01/20/2021 1133   LYMPHSABS 0.3 (L) 01/20/2021 1133   MONOABS 0.2 01/20/2021 1133   EOSABS 0.5 01/20/2021 1133   BASOSABS 0.0 01/20/2021 1133   Comprehensive Metabolic Panel:    Component Value Date/Time   NA 134 (L) 01/20/2021 1133   K 3.4 (L) 01/20/2021 1133   CL 99 01/20/2021 1133   CO2 27 01/20/2021 1133   BUN 7 (L) 01/20/2021 1133   CREATININE 0.68 01/20/2021 1133   GLUCOSE 123 (H) 01/20/2021 1133   CALCIUM 8.3 (L) 01/20/2021 1133   AST 23 01/20/2021 1133   ALT 24 01/20/2021 1133   ALKPHOS 126 01/20/2021 1133   BILITOT 0.3 01/20/2021 1133   PROT 6.0 (L) 01/20/2021 1133   ALBUMIN 2.7 (L) 01/20/2021 1133    RADIOGRAPHIC STUDIES: No results found.  PERFORMANCE STATUS (ECOG) : 1 - Symptomatic but completely ambulatory  Review of Systems  Constitutional:  Positive for appetite change.  HENT:  Positive for facial swelling, trouble swallowing and voice change.   Respiratory:  Positive for cough.   Musculoskeletal:  Positive for arthralgias and neck pain.  Neurological:  Positive for weakness.  Unless otherwise noted, a complete review of systems is negative.  Physical Exam General: distressed due to pain, in wheelchair Cardiovascular: regular rate and rhythm Pulmonary: clear bilaterally HEENT: right facial and neck swelling, massive right  fungating neck mass extending across chin, neck, and mouth, warm to touch, some erythema Neurological: Weakness but otherwise nonfocal, AAO x3  IMPRESSION:  Brian Castillo presents to the clinic today with his wife. Currently sitting in the wheelchair, distressed from pain.  Patient states he made requested adjustments to his pain regimen taking 2 Dilaudid every 3-4 hours. He was able gain some relief until this morning. Wife shared he took Dilaudid around 9pm and was able to sleep until a little after 2pm.   This morning patient took his medication as recommended unfortunately pain became more severe with no relief. Also when trying  to eat breakfast states food seemed to become stuck in his throat and he was able to quickly clear by drinking some milk. Unfortunately he did not gain much relief of his pain regardless of taking his medications prompting him to request a clinic visit for pain control and further work-up.   Dr. Lorenso Courier at the bedside also evaluating and speaking with patient and wife. Jarrett Soho, RN and [place], RN to start IV fluids and administer IV Dilaudid.   Unfortunately due to Mr. Mcmahen's pain continuing to be severe he will need to be admitted into the hospital for further work-up and pain control.  Patient and wife verbalized understanding of plan and in agreement.  I empathetically spoke with Mr. Jeziorski in regards to his emotional feelings in relation to his current health status.  He is tearful expressing he has not thought  much about his actual feelings as the pain has been a priority for him.  He shares his understanding of his condition and knows at some point he will be facing a more end-of-life situation.  He states "everyone has to go at some time" however his biggest worries is his children, their understanding, and acceptance of his condition.  Emotional support provided.  He feels as though his wife will cope as best as possible as she has been a great support for him throughout this journey.  I approach topics in regards to his wishes in relation to CODE STATUS and possible need for PEG tube.  Patient would like to speak further with his wife and potentially have family discussions depending on outcomes and future recommendations.  I spoke personally with Dr. Marylyn Ishihara (hospitalist) requesting direct admission.  This request was approved and patient transported to room 1608 via wheelchair by Jarrett Soho, RN and myself.  Velva Harman, RN given updates.  As requested by medical team I will continue to support patient and assist with symptom management and goals of care as needed.  PLAN: Patient will be admitted to [facility] room 1608 for further work-up and symptom management.  I will plan to continue to support patient and engage in goals of care discussions as necessary.   Patient expressed understanding and was in agreement with this plan. He also understands that He can call the clinic at any time with any questions, concerns, or complaints.     Time Total: 40 min.   Visit consisted of counseling and education dealing with the complex and emotionally intense issues of symptom management and palliative care in the setting of serious and potentially life-threatening illness.Greater than 50%  of this time was spent counseling and coordinating care related to the above assessment and plan.  Signed by: Alda Lea, AGPCNP-BC Palliative Medicine Team

## 2021-01-23 ENCOUNTER — Inpatient Hospital Stay (HOSPITAL_COMMUNITY): Payer: Medicare Other

## 2021-01-23 ENCOUNTER — Other Ambulatory Visit: Payer: Self-pay | Admitting: Nurse Practitioner

## 2021-01-23 DIAGNOSIS — C8251 Diffuse follicle center lymphoma, lymph nodes of head, face, and neck: Principal | ICD-10-CM

## 2021-01-23 DIAGNOSIS — M542 Cervicalgia: Secondary | ICD-10-CM

## 2021-01-23 DIAGNOSIS — G893 Neoplasm related pain (acute) (chronic): Secondary | ICD-10-CM

## 2021-01-23 DIAGNOSIS — G40909 Epilepsy, unspecified, not intractable, without status epilepticus: Secondary | ICD-10-CM

## 2021-01-23 DIAGNOSIS — I10 Essential (primary) hypertension: Secondary | ICD-10-CM | POA: Diagnosis not present

## 2021-01-23 DIAGNOSIS — Z515 Encounter for palliative care: Secondary | ICD-10-CM

## 2021-01-23 DIAGNOSIS — Z66 Do not resuscitate: Secondary | ICD-10-CM

## 2021-01-23 DIAGNOSIS — Z7189 Other specified counseling: Secondary | ICD-10-CM

## 2021-01-23 LAB — CBC
HCT: 27.8 % — ABNORMAL LOW (ref 39.0–52.0)
Hemoglobin: 9.3 g/dL — ABNORMAL LOW (ref 13.0–17.0)
MCH: 31.7 pg (ref 26.0–34.0)
MCHC: 33.5 g/dL (ref 30.0–36.0)
MCV: 94.9 fL (ref 80.0–100.0)
Platelets: 181 10*3/uL (ref 150–400)
RBC: 2.93 MIL/uL — ABNORMAL LOW (ref 4.22–5.81)
RDW: 13.7 % (ref 11.5–15.5)
WBC: 1.1 10*3/uL — CL (ref 4.0–10.5)
nRBC: 0 % (ref 0.0–0.2)

## 2021-01-23 LAB — COMPREHENSIVE METABOLIC PANEL
ALT: 54 U/L — ABNORMAL HIGH (ref 0–44)
AST: 64 U/L — ABNORMAL HIGH (ref 15–41)
Albumin: 2.6 g/dL — ABNORMAL LOW (ref 3.5–5.0)
Alkaline Phosphatase: 83 U/L (ref 38–126)
Anion gap: 7 (ref 5–15)
BUN: 6 mg/dL — ABNORMAL LOW (ref 8–23)
CO2: 26 mmol/L (ref 22–32)
Calcium: 8.1 mg/dL — ABNORMAL LOW (ref 8.9–10.3)
Chloride: 102 mmol/L (ref 98–111)
Creatinine, Ser: 0.54 mg/dL — ABNORMAL LOW (ref 0.61–1.24)
GFR, Estimated: >60 mL/min (ref >60)
Glucose, Bld: 110 mg/dL — ABNORMAL HIGH (ref 70–99)
Potassium: 3.8 mmol/L (ref 3.5–5.1)
Sodium: 135 mmol/L (ref 135–145)
Total Bilirubin: 0.4 mg/dL (ref 0.3–1.2)
Total Protein: 5.6 g/dL — ABNORMAL LOW (ref 6.5–8.1)

## 2021-01-23 MED ORDER — IOHEXOL 350 MG/ML SOLN
60.0000 mL | Freq: Once | INTRAVENOUS | Status: AC | PRN
  Administered 2021-01-23: 60 mL via INTRAVENOUS

## 2021-01-23 MED ORDER — LORAZEPAM 1 MG PO TABS: 1.0000 mg | ORAL_TABLET | ORAL | Status: DC | PRN

## 2021-01-23 MED ORDER — HYDROMORPHONE HCL 1 MG/ML IJ SOLN
1.0000 mg | INTRAMUSCULAR | Status: DC | PRN
Start: 2021-01-23 — End: 2021-01-24
  Administered 2021-01-23 – 2021-01-24 (×5): 2 mg via INTRAVENOUS
  Filled 2021-01-23 (×5): qty 2

## 2021-01-23 MED ORDER — LORAZEPAM 2 MG/ML PO CONC
1.0000 mg | ORAL | Status: DC | PRN
  Filled 2021-01-23: qty 0.5

## 2021-01-23 MED ORDER — CELECOXIB 200 MG PO CAPS
200.0000 mg | ORAL_CAPSULE | Freq: Two times a day (BID) | ORAL | 0 refills | Status: AC
Start: 2021-01-23 — End: 2021-02-22

## 2021-01-23 MED ORDER — POLYVINYL ALCOHOL 1.4 % OP SOLN
1.0000 [drp] | Freq: Four times a day (QID) | OPHTHALMIC | Status: DC | PRN
  Filled 2021-01-23: qty 15

## 2021-01-23 MED ORDER — CHLORHEXIDINE GLUCONATE CLOTH 2 % EX PADS
6.0000 | MEDICATED_PAD | Freq: Every day | CUTANEOUS | Status: DC
  Administered 2021-01-23: 6 via TOPICAL

## 2021-01-23 MED ORDER — CELECOXIB 200 MG PO CAPS
200.0000 mg | ORAL_CAPSULE | Freq: Two times a day (BID) | ORAL | Status: DC
  Administered 2021-01-23 – 2021-01-26 (×6): 200 mg via ORAL
  Filled 2021-01-23 (×9): qty 1

## 2021-01-23 MED ORDER — GLYCOPYRROLATE 1 MG PO TABS: 1.0000 mg | ORAL_TABLET | ORAL | Status: DC | PRN

## 2021-01-23 MED ORDER — GLYCOPYRROLATE 0.2 MG/ML IJ SOLN
0.3000 mg | INTRAMUSCULAR | Status: DC | PRN
  Administered 2021-01-27: 0.3 mg via INTRAVENOUS
  Filled 2021-01-23: qty 2

## 2021-01-23 MED ORDER — LORAZEPAM 2 MG/ML IJ SOLN
1.0000 mg | INTRAMUSCULAR | Status: DC | PRN
  Administered 2021-01-26 – 2021-01-27 (×3): 1 mg via INTRAVENOUS
  Filled 2021-01-23 (×3): qty 1

## 2021-01-23 MED ORDER — BIOTENE DRY MOUTH MT LIQD: 15.0000 mL | OROMUCOSAL | Status: DC | PRN

## 2021-01-23 NOTE — Progress Notes (Signed)
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  Today I had the opportunity to speak with Brian Castillo and his wife.  Unfortunately he appears to be refractory to fourth line treatment with Monjuvi and lenalidomide.  His disease has been remarkably aggressive and is responded extremely poorly to therapy.  We ordered a CT scan of the neck in order to evaluate the progression of his cancer.  It has increased markedly in size and is now threatening critical regions including vocal cords, carotid artery, and airway.  Given the rapid progression of this tumor I do not believe that there is any benefit for continued immunotherapy or chemotherapy.  I would recommend a comfort based approach.  The patient is wife had opportunity to discuss questions at the end of her agreeable that comfort based approach would be most appropriate at this time.  We are currently making arrangements for transition to hospice at his facility [facility].   O:   Vitals:   02/05/2021 2039 01/23/21 0340  BP: (!) 160/66 135/62  Pulse: 77 70  Resp: 20   Temp: 97.9 F (36.6 C) 98.4 F (36.9 C)  SpO2: 95% 95%   CBC Latest Ref Rng & Units 01/23/2021 01/29/2021 01/20/2021  WBC 4.0 - 10.5 K/uL 1.1(LL) 1.2(LL) 1.5(L)  Hemoglobin 13.0 - 17.0 g/dL 9.3(L) 9.1(L) 10.6(L)  Hematocrit 39.0 - 52.0 % 27.8(L) 27.1(L) 30.5(L)  Platelets 150 - 400 K/uL 181 191 205   CMP Latest Ref Rng & Units 01/23/2021 01/15/2021 01/20/2021  Glucose 70 - 99 mg/dL 110(H) 105(H) 123(H)  BUN 8 - 23 mg/dL 6(L) 7(L) 7(L)  Creatinine 0.61 - 1.24 mg/dL 0.54(L) 0.57(L) 0.68  Sodium 135 - 145 mmol/L 135 135 134(L)  Potassium 3.5 - 5.1 mmol/L 3.8 3.4(L) 3.4(L)  Chloride 98 - 111 mmol/L 102 102 99  CO2 22 - 32 mmol/L 26 26 27   Calcium 8.9 - 10.3 mg/dL 8.1(L) 8.0(L) 8.3(L)  Total Protein 6.5 -  8.1 g/dL 5.6(L) 5.4(L) 6.0(L)  Total Bilirubin 0.3 - 1.2 mg/dL 0.4 0.6 0.3  Alkaline Phos 38 - 126 U/L 83 89 126  AST 15 - 41 U/L 64(H) 56(H) 23  ALT 0 - 44 U/L 54(H) 45(H) 24    A/P:  Recommendations: --At this time I do not believe that there are any additional treatments that we would be able to offer.  His disease has been remarkably refractory to 4 lines of therapy. --Had a long and detailed discussion with the patient and his wife today regarding goals of care and options moving forward. -- Agree with referral to hospice at [facility].  Patient and his wife are agreeable to this plan. --Pain management per palliative care team --Oncology will continue to follow as needed  Ledell Peoples, MD Department of Hematology/Oncology [place] at [facility] Phone: [phone] Pager: [phone] Email: [email or web address]

## 2021-01-23 NOTE — Evaluation (Signed)
SLP Cancellation Note  Patient Details Name: MARSTON MCCADDEN MRN: [id number] DOB: 1941-03-11   Cancelled treatment:       Reason Eval/Treat Not Completed: Other (comment) (Per chart review, pt now full comfort care with hopes to transition to [facility] for hospice.  No SLP intervention needed at this time.  Thanks.)  Kathleen Lime, MS [facility] SLP Acute Rehab Services Office [phone] Pager [phone]   Macario Golds 01/23/2021, 5:50 PM

## 2021-01-23 NOTE — Progress Notes (Signed)
PROGRESS NOTE    [facility]  WPV:[id number] DOB: [birth year] DOA: 01/14/2021 PCP: Shon Baton, MD    No chief complaint on file.   Brief Narrative:  H/o DLBCL getting monjuvi, last dose appear on 11/7 ,  who has had progressively worsening right neck mass and uncontrolled pain, direct admission from cancer center .  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)   Neck pain   HTN (hypertension)   Seizure disorder (HCC)   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HCC)  DLBCL Unfortunately patient's recent CT scan showed tumor progression in addition to mass encasing the right ICA and a leaning against distal right CCA, invasion of Larynex, pharynx, and right aspect of epiglottis/vocal cord Oncology/palliative care on board for pain control and goals of care discussion, will follow recommendation  Neutropenia/anemia in the setting of malignancy   Seizures, seizure free for many years Has been on dilantin for many years  Nutritional Assessment: The patient's BMI is: Body mass index is 24.74 kg/m.[name]      Unresulted Labs (From admission, onward)     Start     Ordered   01/29/21 0500  Creatinine, serum  (enoxaparin (LOVENOX)    CrCl >/= 30 ml/min)  Weekly,   R     Comments: while on enoxaparin therapy    02/02/2021 1650              DVT prophylaxis: enoxaparin (LOVENOX) injection 40 mg Start: 01/29/2021 1745   Code Status: DNR Family Communication: Patient Disposition:   Status is: Inpatient  Dis[facility]: [facility]              Anticipated d/c is to: [facility]              Anticipated d/c date is: Possible tomorrow pending goals of care discussion and pain control                Consultants:  Oncology Palliative care  Procedures:  None  Antimicrobials:    None    Objective: Vitals:   02/10/2021 1544 01/25/2021 1833 01/21/2021 2039 01/23/21 0340  BP:  (!) 156/64 (!) 160/66 135/62  Pulse:  83 77 70  Resp:  18 20   Temp:  99.2 F (37.3 C) 97.9 F (36.6 C) 98.4 F (36.9 C)  TempSrc:  Oral Oral Oral  SpO2:  99% 95% 95%  Weight: 73.8 kg     Height: 5\' 8"  (1.727 m)       Intake/Output Summary (Last 24 hours) at 01/23/2021 1105 Last data filed at 01/23/2021 0304 Gross per 24 hour  Intake 501.95 ml  Output --  Net 501.95 ml   Filed Weights   02/10/2021 1544  Weight: 73.8 kg    Examination:  General exam: alert, awake, communicative,  large neck mass Respiratory system: Clear to auscultation. Respiratory effort normal. Cardiovascular system:  RRR.  Gastrointestinal system: Abdomen is nondistended, soft and nontender.  Normal bowel sounds heard. Central nervous system: Alert and oriented.  No focal neurological deficits. Extremities:  no edema Skin: large neck mass Psychiatry: Judgement and insight appear normal. Mood & affect appropriate.     Data Reviewed: I have personally reviewed following labs and imaging studies  CBC: Recent Labs  Lab 01/20/21 1133 01/23/2021 1644 01/23/21 0502  WBC 1.5* 1.2* 1.1*  NEUTROABS 0.4* 0.2*  --   HGB 10.6* 9.1* 9.3*  HCT 30.5* 27.1* 27.8*  MCV 90.2 94.1 94.9  PLT 205 191 283    Basic Metabolic Panel: Recent Labs  Lab 01/20/21 1133 01/20/2021 1644 01/23/21 0502  NA 134* 135 135  K 3.4* 3.4* 3.8  CL 99 102 102  CO2 27 26 26   GLUCOSE 123* 105* 110*  BUN 7* 7* 6*  CREATININE 0.68 0.57* 0.54*  CALCIUM 8.3* 8.0* 8.1*  MG  --  1.9  --   PHOS  --  3.0  --     GFR: Estimated Creatinine Clearance: 68.9 mL/min (A) (by  C-G formula based on SCr of 0.54 mg/dL (L)).  Liver Function Tests: Recent Labs  Lab 01/20/21 1133 01/23/2021 1644 01/23/21 0502  AST 23 56* 64*  ALT 24 45* 54*  ALKPHOS 126 89 83  BILITOT 0.3 0.6 0.4  PROT 6.0* 5.4* 5.6*  ALBUMIN 2.7* 2.6* 2.6*    CBG: No results for input(s): GLUCAP in the last 168 hours.   Recent Results (from the past 240 hour(s))  Resp Panel by RT-PCR (Flu A&B, Covid) Nasopharyngeal Swab     Status: None   Collection Time: 02/10/2021  5:23 PM   Specimen: Nasopharyngeal Swab; Nasopharyngeal(NP) swabs in vial transport medium  Result Value Ref Range Status   SARS Coronavirus 2 by RT PCR NEGATIVE NEGATIVE Final    Comment: (NOTE) SARS-CoV-2 target nucleic acids are NOT DETECTED.  The SARS-CoV-2 RNA is generally detectable in upper respiratory specimens during the acute phase of infection. The lowest concentration of SARS-CoV-2 viral copies this assay can detect is 138 copies/mL. A negative result does not preclude SARS-Cov-2 infection and should not be used as the sole basis for treatment or other patient management decisions. A negative result may occur with  improper specimen collection/handling, submission of specimen other than nasopharyngeal swab, presence of viral mutation(s) within the areas targeted by this assay, and inadequate number of viral copies(<138 copies/mL). A negative result must be combined with clinical observations, patient history, and epidemiological information. The expected result is Negative.  Fact Sheet for Patients:  EntrepreneurPulse.com.au  Fact Sheet for Healthcare Providers:  IncredibleEmployment.be  This test is no t yet approved or cleared by the [place] FDA and  has been authorized for detection and/or diagnosis of SARS-CoV-2 by FDA under an Emergency Use Authorization (EUA). This EUA will remain  in effect (meaning this test can be used) for the duration of the COVID-19  declaration under Section 564(b)(1) of the Act, 21 U.S.C.section 360bbb-3(b)(1), unless the authorization is terminated  or revoked sooner.       Influenza A by PCR NEGATIVE NEGATIVE Final   Influenza B by PCR NEGATIVE NEGATIVE Final    Comment: (NOTE) The Xpert Xpress SARS-CoV-2/FLU/RSV plus assay is intended as an aid in the diagnosis of influenza from Nasopharyngeal swab specimens and should not be used as a sole basis for treatment. Nasal washings and aspirates are unacceptable for Xpert Xpress SARS-CoV-2/FLU/RSV testing.  Fact Sheet for Patients: EntrepreneurPulse.com.au  Fact Sheet for Healthcare Providers: IncredibleEmployment.be  This test is not yet approved or cleared by the [place] FDA  and has been authorized for detection and/or diagnosis of SARS-CoV-2 by FDA under an Emergency Use Authorization (EUA). This EUA will remain in effect (meaning this test can be used) for the duration of the COVID-19 declaration under Section 564(b)(1) of the Act, 21 U.S.C. section 360bbb-3(b)(1), unless the authorization is terminated or revoked.  Performed at [facility], [name] [address], [place], [place] [address]          Radiology Studies: No results found.      Scheduled Meds:  allopurinol  300 mg Oral Daily   aspirin EC  81 mg Oral Daily   Chlorhexidine Gluconate Cloth  6 each Topical Daily   dexamethasone (DECADRON) injection  4 mg Intravenous Q12H   enoxaparin (LOVENOX) injection  40 mg Subcutaneous Q24H   gabapentin  600 mg Oral TID   Latanoprostene Bunod  1 drop Both Eyes QHS   morphine  15 mg Oral Q12H   phenytoin  200 mg Oral BID   polyethylene glycol  17 g Oral Daily   rosuvastatin  20 mg Oral Daily   timolol  1 drop Both Eyes BID   Continuous Infusions:  sodium chloride 50 mL/hr at 01/30/2021 1719     LOS: 1 day   Time spent: 30mins Greater than 50% of this time was spent in counseling,  explanation of diagnosis, planning of further management, and coordination of care.   Voice Recognition Viviann Spare dictation system was used to create this note, attempts have been made to correct errors. Please contact the author with questions and/or clarifications.   Florencia Reasons, MD PhD FACP [facility]  Available via Epic secure chat 7am-7pm for nonurgent issues Please page for urgent issues To page the attending provider between 7A-7P or the covering provider during after hours 7P-7A, please log into the web site [email or web address] and access using universal [place] password for that web site. If you do not have the password, please call the hospital operator.    01/23/2021, 11:05 AM

## 2021-01-23 NOTE — Progress Notes (Signed)
Nutrition Brief Note  RD consulted for nutritional assessment.  Chart reviewed. Orders for comfort measures placed. No nutrition interventions planned at this time.   Clayton Bibles, MS, RD, LDN Inpatient Clinical Dietitian Contact information available via Amion

## 2021-01-23 NOTE — Progress Notes (Signed)
Daily Progress Note   Patient Name: Brian Castillo       Date: 01/23/2021 DOB: 1940/06/11  Age: 80 y.o. MRN#: [id number] Attending Physician: Brian Reasons, MD Primary Care Physician: Brian Baton, MD Admit Date: 02/02/2021  Reason for Consultation/Follow-up: Establishing goals of care, Non pain symptom management, and Pain control  Subjective: Chart Reviewed. Updates Received. Patient Assessed.   Patient resting in bed comfortably.  Wife is at the bedside.  Complains of some right discomfort to his right ear and face.  Current pain regimen is providing effective relief at this time.  He is able to take in some soft foods and liquids however is unable to eat anything that requires him to chew.  Detailed goals of care discussions and updates provided with support from Brian Castillo, Brian Castillo).  Unfortunately patient's recent CT scan showed tumor progression in addition to mass encasing the right ICA and a leaning against distal right CCA, invasion of Larynex, pharynx, and right aspect of epiglottis/vocal cord.  Patient and wife verbalized understanding but not without significant questions related to his cancer progression.  They are aware [facility] will also follow-up and provide further updates.  We discussed expectations with awareness of no available treatment options at this time with concern of high risk of sudden death and or symptom burden.  Patient and wife verbalized understanding.  They spent time discussing affairs with plans to make arrangements to tie up any loose areas.  Patient and wife confirms he does have advanced directives and living [place].  They are preparing to share the news with her 2 children.  Support offered and patient is aware if assistance is needed we can coordinate a family meeting or speak to his children if desired.  Education provided on symptom management with recommendations to transition care to focus on his comfort with aggressive symptom management.  We  discussed at length patient's current full CODE STATUS with additional recommendations for DNR/DNI.  Brian Castillo and his wife verbalized understanding expressing wishes for DNR.  He is aware orders will be placed in the RN will place a purple DNR bracelet on him to identify his wishes to the medical team.  Pain and symptom management discussed.  Patient wishes to continue with oral medications as tolerated with awareness this may become difficult in the near future.  At that time he knows he would not be able to take in any oral nutrition or medication by mouth for safety.  Education provided on the use of continuous IV medications/drips in the setting of high symptom burden (respiratory distress, air hunger, dysphagia, and uncontrolled pain).  Patient and his wife verbalized understanding and he does not wish to suffer at any point if possible.  Recommendations for outpatient hospice support.  Patient and wife would like to return back to [facility] to allow for his wife to be near him at all times and to be in a familiar environment.  Education provided on hospice and their philosophy of care with support at facility.  They are aware hospice will continue ongoing support and symptom management as needed in collaboration with Brian Castillo and the medical team.  Education and recommendations provided to transition care to focus on his comfort for the remainder of his hospitalization in preparation for discharge.  He is aware we will minimize his medications and continue those that provide comfort and support.  Brian Castillo and his wife confirms wishes for DNR/DNI, all care to focus on his comfort, with  wishes to discharge back to [facility] skilled facility with hospice support allow him to spend what time he has left amongst his family and friends.  He is hopeful to be able to transport over the to his residential home at [facility] for visits and if symptom burden does not hinder his safety.   All questions  answered and support provided.   Length of Stay: 1 day  Vital Signs: BP 135/62   Pulse 70   Temp 98.4 F (36.9 C) (Oral)   Resp 20   Ht 5\' 8"  (1.727 m)   Wt 73.8 kg   SpO2 95%   BMI 24.74 kg/m  SpO2: SpO2: 95 % O2 Device: O2 Device: Room Air O2 Flow Rate:    Physical Exam: NAD, frail, ill-appearing Normal breathing pattern Raspy voice No edema Massive right sided facial fungating tumor invading ear, neck, mouth AAOx3, mood appropriate               Palliative Care Assessment & Plan  HPI: Brian Castillo is a 80 y.o. male with multiple medical problems including stage III follicular lymphoma (Revlimid/Monjuvi), arthritis, GERD, glaucoma, hyperlipidemia, hypertension, and seizures.  Palliative ask to see for symptom management and goals of care.   Code Status: DNR  Goals of Care/Recommendations: DNR/DNI as confirmed by patient and wife (form completed and on chart)  Transition care to focus on comfort, minimize medications, discontinue medical interventions not comfort focused TOC referral for patient to return to [facility] skilled living with hospice support Dilaudid PRN for pain/air hunger/comfort Robinul PRN for excessive secretions Ativan PRN for agitation/anxiety Celebrex twice daily  Zofran PRN for nausea Liquifilm tears PRN for dry eyes May have comfort feeding Comfort cart for family Unrestricted visitations in the setting of EOL (per policy) Oxygen PRN 2L or less for comfort. No escalation.   PMT will continue to support and follow.    Prognosis: < 6 weeks  Discharge Planning: [facility] with Hospice  Thank you for allowing the Palliative Medicine Team to assist in the care of this patient.  Time Total: 45 min.   Visit consisted of counseling and education dealing with the complex and emotionally intense issues of symptom management and palliative care in the setting of serious and potentially life-threatening illness.Greater than 50%   of this time was spent counseling and coordinating care related to the above assessment and plan.  Alda Lea, AGPCNP-BC  Palliative Medicine Team [phone]

## 2021-01-23 NOTE — Evaluation (Signed)
SLP Cancellation Note  Patient Details Name: SUHAS ESTIS MRN: [id number] DOB: 1940/06/24   Cancelled treatment:       Reason Eval/Treat Not Completed: Other (comment) (pt seeing palliative at this time)  Kathleen Lime, MS [place] [phone] Pager [phone]  Macario Golds 01/23/2021, 2:24 PM

## 2021-01-23 NOTE — Consult Note (Addendum)
[place] Nurse Consult Note: Reason for Consult: Mass to right mandible Wound type: neoplastic Pressure Injury POA: N/A Measurement: 10cm x 7cm with depth unable to be determined due to the presence of nonviable tissue Wound bed:As described above.  Brown/grey wound bed. Drainage (amount, consistency, odor)  Small to moderate foul smelling exudate from two central places on lesion Periwound: intact Dressing procedure/placement/frequency: Exquisite pain. This is a nonhealable wound. Patient removed his own dressing. Recently seen in the outpatient wound care center, they started on silver hydrofiber.  I will continue this and provide guidance for nursing via the orders. Aquacel AG+ Advantage, [place] # [id number] topped with ABD and secured with ONLY paper tape.  Patient should continue follow up at outpatient wound care center as they directed unless Ha[place]ana Nakya change to a palliative focus.    [place] nursing team will not follow, but will remain available to this patient, the nursing and medical teams.  Please re-consult if needed. Thanks, Maudie Flakes, MSN, RN, [place], Arther Abbott  Pager# [phone]

## 2021-01-24 DIAGNOSIS — Z515 Encounter for palliative care: Secondary | ICD-10-CM

## 2021-01-24 DIAGNOSIS — M542 Cervicalgia: Secondary | ICD-10-CM | POA: Diagnosis not present

## 2021-01-24 DIAGNOSIS — I10 Essential (primary) hypertension: Secondary | ICD-10-CM | POA: Diagnosis not present

## 2021-01-24 DIAGNOSIS — C8251 Diffuse follicle center lymphoma, lymph nodes of head, face, and neck: Secondary | ICD-10-CM | POA: Diagnosis not present

## 2021-01-24 DIAGNOSIS — G40909 Epilepsy, unspecified, not intractable, without status epilepticus: Secondary | ICD-10-CM | POA: Diagnosis not present

## 2021-01-24 MED ORDER — MORPHINE SULFATE ER 30 MG PO TBCR: 30.0000 mg | EXTENDED_RELEASE_TABLET | Freq: Two times a day (BID) | ORAL | 0 refills | Status: AC

## 2021-01-24 MED ORDER — HYDROMORPHONE 1 MG/ML IV SOLN: INTRAVENOUS | 0 refills | Status: AC

## 2021-01-24 MED ORDER — LORAZEPAM 1 MG PO TABS: 1.0000 mg | ORAL_TABLET | Freq: Four times a day (QID) | ORAL | 0 refills | Status: AC | PRN

## 2021-01-24 MED ORDER — MORPHINE SULFATE ER 15 MG PO TBCR
30.0000 mg | EXTENDED_RELEASE_TABLET | Freq: Two times a day (BID) | ORAL | Status: DC
  Administered 2021-01-24 – 2021-01-26 (×5): 30 mg via ORAL
  Filled 2021-01-24 (×5): qty 2

## 2021-01-24 MED ORDER — HYDROMORPHONE HCL 2 MG/ML IJ SOLN
2.0000 mg | INTRAMUSCULAR | Status: DC | PRN
Start: 2021-01-24 — End: 2021-01-27
  Administered 2021-01-24: 4 mg via INTRAVENOUS
  Administered 2021-01-24 (×3): 2 mg via INTRAVENOUS
  Administered 2021-01-25: 4 mg via INTRAVENOUS
  Administered 2021-01-25 – 2021-01-27 (×13): 2 mg via INTRAVENOUS
  Filled 2021-01-24: qty 1
  Filled 2021-01-24: qty 2
  Filled 2021-01-24 (×17): qty 1

## 2021-01-24 NOTE — NC FL2 (Signed)
[place] LEVEL OF CARE SCREENING TOOL     IDENTIFICATION  Patient Name: Brian Castillo Birthdate: [birth year] Sex: male Admission Date (Current Location): 01/31/2021  [facility] and [place] Number:  Herbalist and Address:  [facility],  [address], [place]      Provider Number: [id number]  Attending Physician Name and Address:  Florencia Reasons, MD  Relative Name and Phone Number:       Current Level of Care: Hospital Recommended Level of Care: [place] Prior Approval Number:    Date Approved/Denied:   PASRR Number:    Discharge Plan: SNF    Current Diagnoses: Patient Active Problem List   Diagnosis Date Noted   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[place]) 02/08/2021   Severe neutropenia ([place]) 12/10/2020   Seizure disorder ([place]) 12/10/2020   Normocytic anemia 12/10/2020   Lung nodule 12/10/2020   Bilateral occipital neuralgia 11/05/2020   Neck pain 09/13/2020   Internal carotid artery stenosis, left 09/13/2020   Anemia 69/67/8938   Follicular lymphoma ([place]) 09/13/2020   HTN (hypertension) 09/13/2020   Port-A-Cath in place 12/30/5100   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 ([place]) 05/29/2020   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/24/2014    Orientation RESPIRATION BLADDER Height & Weight     Self, Time, Situation, Place  Normal Continent Weight: 73.8 kg Height:  5\' 8"  (172.7 cm)  BEHAVIORAL SYMPTOMS/MOOD NEUROLOGICAL BOWEL NUTRITION STATUS      Continent Diet (comfort feeds)  AMBULATORY STATUS COMMUNICATION OF NEEDS Skin   Extensive Assist Verbally Skin abrasions                       Personal Care Assistance Level of Assistance  Bathing, Feeding, Dressing, Total care Bathing Assistance: Limited assistance Feeding assistance: Limited assistance Dressing Assistance: Maximum assistance Total Care Assistance: Maximum assistance   Functional Limitations Info  Sight, Hearing,  Speech Sight Info: Impaired Hearing Info: Impaired      SPECIAL CARE FACTORS FREQUENCY                       Contractures Contractures Info: Not present    Additional Factors Info  Code Status, Allergies Code Status Info: DNR Allergies Info: Brimonidine Tartrate-timolol, Lisinopril, Netarsudil Dimesylate           Current Medications (01/24/2021):  This is the current hospital active medication list Current Facility-Administered Medications  Medication Dose Route Frequency Provider Last Rate Last Admin   0.9 %  sodium chloride infusion   Intravenous Continuous Kathie Dike, MD 50 mL/hr at 01/23/21 1531 New Bag at 01/23/21 1531   acetaminophen (TYLENOL) tablet 650 mg  650 mg Oral Q6H PRN Kathie Dike, MD       Or   acetaminophen (TYLENOL) suppository 650 mg  650 mg Rectal Q6H PRN Kathie Dike, MD       allopurinol (ZYLOPRIM) tablet 300 mg  300 mg Oral Daily Kathie Dike, MD   300 mg at 01/24/21 1002   antiseptic oral rinse (BIOTENE) solution 15 mL  15 mL Topical PRN Pickenpack-Cousar, Athena N, NP       celecoxib (CELEBREX) capsule 200 mg  200 mg Oral BID Pickenpack-Cousar, Athena N, NP   200 mg at 01/24/21 1001   dexamethasone (DECADRON) injection 4 mg  4 mg Intravenous Q12H Kathie Dike, MD   4 mg at 01/24/21 1001   gabapentin (NEURONTIN) capsule 600 mg  600 mg Oral TID Kathie Dike, MD   600 mg at 01/24/21 1001   glycopyrrolate (ROBINUL) tablet 1 mg  1 mg Oral Q4H PRN Pickenpack-Cousar, Carlena Sax, NP       Or   glycopyrrolate (ROBINUL) injection 0.3 mg  0.3 mg Intravenous Q4H PRN Pickenpack-Cousar, Carlena Sax, NP       HYDROmorphone (DILAUDID) injection 2-4 mg  2-4 mg Intravenous Q2H PRN Florencia Reasons, MD   2 mg at 01/24/21 1003   Latanoprostene Bunod 0.024 % SOLN 1 drop  1 drop Both Eyes QHS Kathie Dike, MD   1 drop at 01/23/21 2133   LORazepam (ATIVAN) tablet 1 mg  1 mg Oral Q4H PRN Pickenpack-Cousar, Athena N, NP       Or   LORazepam (ATIVAN) 2 MG/ML  concentrated solution 1 mg  1 mg Sublingual Q4H PRN Pickenpack-Cousar, Athena N, NP       Or   LORazepam (ATIVAN) injection 1 mg  1 mg Intravenous Q4H PRN Pickenpack-Cousar, Athena N, NP       morphine (MS CONTIN) 12 hr tablet 30 mg  30 mg Oral Q12H Pickenpack-Cousar, Athena N, NP   30 mg at 01/24/21 1001   ondansetron (ZOFRAN) injection 4 mg  4 mg Intravenous Q6H PRN Kathie Dike, MD       phenytoin (DILANTIN) ER capsule 200 mg  200 mg Oral BID Kathie Dike, MD   200 mg at 01/24/21 1001   polyethylene glycol (MIRALAX / GLYCOLAX) packet 17 g  17 g Oral Daily Kathie Dike, MD   17 g at 01/23/21 4403   polyvinyl alcohol (LIQUIFILM TEARS) 1.4 % ophthalmic solution 1 drop  1 drop Both Eyes QID PRN Pickenpack-Cousar, Athena N, NP       timolol (BETIMOL) 0.5 % ophthalmic solution 1 drop  1 drop Both Eyes BID Kathie Dike, MD   1 drop at 01/24/21 1002     Discharge Medications: Please see discharge summary for a list of discharge medications.  Relevant Imaging Results:  Relevant Lab Results:   Additional Information ss# [id number]  Sawsan Riggio, Marjie Skiff, RN

## 2021-01-24 NOTE — TOC Initial Note (Signed)
Transition of Care [facility]) - Initial/Assessment Note    Patient Details  Name: Brian Castillo MRN: [id number] Date of Birth: [birth year]  Transition of [name]) CM/SW Contact:    Lynnell Catalan, RN Phone Number: 01/24/2021, 11:40 AM  Clinical Narrative:                 Pt is from [facility]. Pt to dc back to [facility] to the SNF side. Per PMT pt will dc with hospice support and a CADD pump for pain. Spoke with RN supervisor Autumn at [employer] who states that Nature conservation officer of Nursing is out today. She states that she needs to get in touch with her and to touch base with her nurses to ensure that everyone is educated and competent with using the CADD pump. Autumn requests that we wait until Monday for pt to return as there are many moving parts to this situation. [name] with [employer] notified of need for dc with CADD as was [facility] with [facility]. TOC will continue to follow and assist as needed. Attending and PMT alerted of above info as well.        Activities of Daily Living Home Assistive Devices/Equipment: Eyeglasses, Gilford Rile (specify type) ADL Screening (condition at time of admission) Patient's cognitive ability adequate to safely complete daily activities?: No Is the patient deaf or have difficulty hearing?: Yes (trouble hearing out of right ear - swollen and pressure, wife states Dr. Lorenso Courier said there is a lot of wax in right ear) Does the patient have difficulty seeing, even when wearing glasses/contacts?: No Does the patient have difficulty concentrating, remembering, or making decisions?: No Patient able to express need for assistance with ADLs?: Yes Does the patient have difficulty dressing or bathing?: No Independently performs ADLs?: No Communication: Independent Dressing (OT): Independent Grooming: Independent Feeding: Independent Bathing: Independent Toileting: Needs assistance Is this a change from baseline?: Change from baseline, expected to last  >3days In/Out Bed: Needs assistance Is this a change from baseline?: Change from baseline, expected to last >3 days Walks in Home: Needs assistance Is this a change from baseline?: Change from baseline, expected to last >3 days Does the patient have difficulty walking or climbing stairs?: No Weakness of Legs: Both Weakness of Arms/Hands: Both   Admission diagnosis:  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] Patient Active Problem List   Diagnosis Date Noted   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[place]) 02/10/2021   Severe neutropenia ([name]) 12/10/2020   Seizure disorder ([place]) 12/10/2020   Normocytic anemia 12/10/2020   Lung nodule 12/10/2020   Bilateral occipital neuralgia 11/05/2020   Neck pain 09/13/2020   Internal carotid artery stenosis, left 09/13/2020   Anemia 32/99/2426   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 AT [address] [place] [address] Phone: [phone] Fax: [phone]     Social Determinants of Health (SDOH) Interventions    Readmission Risk Interventions Readmission Risk Prevention Plan 12/13/2020  Transportation Screening Complete  PCP or Specialist Appt within 3-5 Days Complete  HRI or [place] Complete  Social Work Consult for [place] Planning/Counseling Complete  Palliative Care Screening Not Applicable  Medication Review Press photographer) Complete  Some recent data might be hidden

## 2021-01-24 NOTE — Progress Notes (Signed)
Daily Progress Note   Patient Name: Brian Castillo       Date: 01/24/2021 DOB: 31-Jul-1940  Age: 80 y.o. MRN#: [id number] Attending Physician: Florencia Reasons, MD Primary Care Physician: Shon Baton, MD Admit Date: 01/25/2021  Reason for Consultation/Follow-up: Establishing goals of care, Non pain symptom management, Pain control, Psychosocial/spiritual support, and Terminal Care  Subjective: Chart Reviewed. Updates Received. Patient Assessed.   Brian Castillo is resting comfortably in bed.  No acute distress noted.  Wife is also at the bedside.  Patient states he did well most of the night however earlier this morning he was in severe pain which he relates most likely to sleeping and not taking medicine as he would if he was awake.  Has been trying to catch up on gaining some pain relief.  Is able to continue to safely drink and eat soft/pured foods which she is appreciative of.  He was hopeful to discharge back to [facility] on today however this most likely will not occur until Monday to allow for approval and all necessary medications to be available.  We discussed at length his current pain regimen.  Given his continued pain we will make adjustments to his as needed medications in addition to increasing his MS Contin to 30 mg.  He wishes to continue to take oral medications until he is no longer able with the exception of his IV Dilaudid.  He knows we can transition to fentanyl patch at any time.  Detailed discussion with patient and his wife in regards to pain management outpatient.  His wishes are to be with his wife at [place] if at all possible with a residential hospice facility being his last resort.  They are aware the facility will be unable to provide IV pain management.  Given his pain and the need to continue aggressively focusing on his comfort education provided on the use of a home PCA pump.  Patient and wife verbalized understanding and appreciation.  I spoke at length with [facility]  ([facility] representative), Bevely Palmer, RN [facility] hospital liaison), Alinda Sierras, RN/CM, and Dr. Erlinda Hong regarding home PCA plans. [name] is collaborating with Bevely Palmer and arranging for set-up on Monday once all is approved. Patient and wife aware.   I created space and opportunity for [name] to express feelings during this difficult time.  He verbalizes his understanding of his condition and wishes to spend what time he has left amongst his family and friends.  He continues to be concerned about leaving his family behind.  Emotional support provided.  He shares they have discussed his prognosis and plans with their children and his sister who was planning on traveling from  to spend time with him.  All questions answered and support provided  Length of Stay: 2 days  Vital Signs: BP (!) 151/106   Pulse 76   Temp 98.4 F (36.9 C) (Oral)   Resp 18   Ht 5\' 8"  (1.727 m)   Wt 73.8 kg   SpO2 98%   BMI 24.74 kg/m  SpO2: SpO2: 98 % O2 Device: O2 Device: Room Air O2 Flow Rate:    Physical Exam: NAD, ill-appearing RRR Diminished bilaterally Severe right-sided fungating facial mass extending into right side of mouth and across neck/throat area, dressing clean dry and intact AAOx3, mood appropriate                Palliative Care Assessment & Plan   Code Status: DNR  Goals of Care/Recommendations: Continue comfort  focused care Tentative plan for patient to discharge back to [facility] on Monday if approved with outpatient hospice support ([facility]). I have spoken at length with medical team with plans to discharge patient to facility on IV Dilaudid PCA for continued pain management in the setting of hospice/comfort focused care.  Prescription has been signed and placed on chart. Comfort medications have been sent to local pharmacy and patient also has some medications in the home that was prescribed prior to admission.  This includes MS Contin, Celebrex, Ativan. PMT will  continue to support and follow.    Prognosis: Weeks  Discharge Planning:  [facility] with [facility] hospice support   Thank you for allowing the Palliative Medicine Team to assist in the care of this patient.  Time Total: 65 min.   Visit consisted of counseling and education dealing with the complex and emotionally intense issues of symptom management and palliative care in the setting of serious and potentially life-threatening illness.Greater than 50%  of this time was spent counseling and coordinating care related to the above assessment and plan.  Alda Lea, AGPCNP-BC  Palliative Medicine Team [phone]

## 2021-01-24 NOTE — Progress Notes (Signed)
PROGRESS NOTE    Brian Castillo  DPO:[id number] DOB: [birth year] DOA: 01/20/2021 PCP: Shon Baton, MD    No chief complaint on file.   Brief Narrative:  H/o DLBCL getting monjuvi, last dose appear on 11/7 ,  who has had progressively worsening right neck mass and uncontrolled pain, direct admission from cancer center .  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 (HCC)   Neck pain   HTN (hypertension)   Seizure disorder (HCC)   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[place])   Hospice care patient   Palliative care patient  DLBCL Unfortunately patient's recent CT scan showed tumor progression in addition to mass encasing the right ICA and a leaning against distal right CCA, invasion of Larynex, pharynx, and right aspect of epiglottis/vocal cord Oncology/palliative care on board for pain control , transition to full comfort measures, in the process of setting up outpatient PCA/hospice service  Neutropenia/anemia in the setting of malignancy   Seizures, seizure free for many years Has been on dilantin for many years  Nutritional Assessment: The patient's BMI is: Body mass index is 24.74 kg/m.[name]      Unresulted Labs (From admission, onward)    None         DVT prophylaxis:   Full comfort measures   Code Status: DNR Family Communication: Wife at bedside Disposition:   Status is: Inpatient  Dispo: The patient is from: Home[facility]              Anticipated d/c is to: [facility]               Anticipated d/c date is: Return to [facility] once hospice and outpatient PCA set up                Consultants:  Oncology Palliative care  Procedures:  None  Antimicrobials:    None    Objective: Vitals:   01/18/2021 2039 01/23/21 0340 01/24/21 0547 01/24/21 1521  BP: (!) 160/66 135/62 (!) 145/71 (!) 151/106  Pulse: 77 70 73 76  Resp: 20  18   Temp: 97.9 F (36.6 C) 98.4 F (36.9 C)    TempSrc: Oral Oral    SpO2: 95% 95% 96% 98%  Weight:      Height:       No intake or output data in the 24 hours ending 01/24/21 1737  Filed Weights   01/24/2021 1544  Weight: 73.8 kg    Examination:  General exam: alert, awake, communicative,  large neck mass Respiratory system: Clear to auscultation. Respiratory effort normal. Cardiovascular system:  RRR.  Gastrointestinal system: Abdomen is nondistended, soft and nontender.  Normal bowel sounds heard. Central nervous system: Alert and oriented. No focal neurological deficits. Extremities:  no edema Skin: large neck mass Psychiatry: Judgement and insight appear normal. Mood & affect appropriate.     Data Reviewed: I have personally reviewed following labs and imaging studies  CBC: Recent Labs  Lab 01/20/21  1133 02/12/2021 1644 01/23/21 0502  WBC 1.5* 1.2* 1.1*  NEUTROABS 0.4* 0.2*  --   HGB 10.6* 9.1* 9.3*  HCT 30.5* 27.1* 27.8*  MCV 90.2 94.1 94.9  PLT 205 191 496    Basic Metabolic Panel: Recent Labs  Lab 01/20/21 1133 01/29/2021 1644 01/23/21 0502  NA 134* 135 135  K 3.4* 3.4* 3.8  CL 99 102 102  CO2 27 26 26   GLUCOSE 123* 105* 110*  BUN 7* 7* 6*  CREATININE 0.68 0.57* 0.54*  CALCIUM 8.3* 8.0* 8.1*  MG  --  1.9  --   PHOS  --  3.0  --     GFR: Estimated Creatinine Clearance: 68.9 mL/min (A) (by C-G formula based on SCr of 0.54 mg/dL (L)).  Liver Function Tests: Recent Labs  Lab 01/20/21 1133 02/09/2021 1644 01/23/21 0502  AST 23 56* 64*  ALT 24 45* 54*  ALKPHOS 126 89 83  BILITOT 0.3 0.6 0.4   PROT 6.0* 5.4* 5.6*  ALBUMIN 2.7* 2.6* 2.6*    CBG: No results for input(s): GLUCAP in the last 168 hours.   Recent Results (from the past 240 hour(s))  Resp Panel by RT-PCR (Flu A&B, Covid) Nasopharyngeal Swab     Status: None   Collection Time: 01/21/2021  5:23 PM   Specimen: Nasopharyngeal Swab; Nasopharyngeal(NP) swabs in vial transport medium  Result Value Ref Range Status   SARS Coronavirus 2 by RT PCR NEGATIVE NEGATIVE Final    Comment: (NOTE) SARS-CoV-2 target nucleic acids are NOT DETECTED.  The SARS-CoV-2 RNA is generally detectable in upper respiratory specimens during the acute phase of infection. The lowest concentration of SARS-CoV-2 viral copies this assay can detect is 138 copies/mL. A negative result does not preclude SARS-Cov-2 infection and should not be used as the sole basis for treatment or other patient management decisions. A negative result may occur with  improper specimen collection/handling, submission of specimen other than nasopharyngeal swab, presence of viral mutation(s) within the areas targeted by this assay, and inadequate number of viral copies(<138 copies/mL). A negative result must be combined with clinical observations, patient history, and epidemiological information. The expected result is Negative.  Fact Sheet for Patients:  EntrepreneurPulse.com.au  Fact Sheet for Healthcare Providers:  IncredibleEmployment.be  This test is no t yet approved or cleared by the [place] FDA and  has been authorized for detection and/or diagnosis of SARS-CoV-2 by FDA under an Emergency Use Authorization (EUA). This EUA will remain  in effect (meaning this test can be used) for the duration of the COVID-19 declaration under Section 564(b)(1) of the Act, 21 U.S.C.section 360bbb-3(b)(1), unless the authorization is terminated  or revoked sooner.       Influenza A by PCR NEGATIVE NEGATIVE Final   Influenza B by  PCR NEGATIVE NEGATIVE Final    Comment: (NOTE) The Xpert Xpress SARS-CoV-2/FLU/RSV plus assay is intended as an aid in the diagnosis of influenza from Nasopharyngeal swab specimens and should not be used as a sole basis for treatment. Nasal washings and aspirates are unacceptable for Xpert Xpress SARS-CoV-2/FLU/RSV testing.  Fact Sheet for Patients: EntrepreneurPulse.com.au  Fact Sheet for Healthcare Providers: IncredibleEmployment.be  This test is not yet approved or cleared by the [place] FDA and has been authorized for detection and/or diagnosis of SARS-CoV-2 by FDA under an Emergency Use Authorization (EUA). This EUA will remain in effect (meaning this test can be used) for the duration of the COVID-19 declaration under Section 564(b)(1) of the Act, 21  U.S.C. section 360bbb-3(b)(1), unless the authorization is terminated or revoked.  Performed at [facility], [place] [address], [place], [place] [address]          Radiology Studies: CT SOFT TISSUE NECK W CONTRAST  Result Date: 01/23/2021 CLINICAL DATA:  Hematologic malignancy, surveillance EXAM: CT NECK WITH CONTRAST TECHNIQUE: Multidetector CT imaging of the neck was performed using the standard protocol following the bolus administration of intravenous contrast. CONTRAST:  28mL OMNIPAQUE IOHEXOL 350 MG/ML SOLN COMPARISON:  12/10/2020 FINDINGS: Redemonstrated right face and neck mass, measuring up to 12.3 x 8.2 x 8.4 cm (AP x TR x CC) (series 4, image 45 and series 7, image 69), previously 8.4 x 6.3 x 6.8 cm when remeasured similarly. The mass was previously noted to involve the right parotid and submandibular spaces, now invading the right masticator space, right buccal space, and right sublingual space. The mass also invades the right mucosal pharyngeal space and visceral space of the neck (series 4, image 58 and series 7, image 77), involving the right aspect of the  epiglottis (series 4, image 48) and larynx. The mass encases the right mandible (series 4, image 43) and right aspect of the hyoid (series 4, image 49), without definite osseous erosion. The right muscles of mastication and platysma are indistinct from the mass. The mass now abuts the right distal common carotid artery and encases proximal right internal and external carotid arteries (series 4, image 46), although they remain patent and without visible pseudoaneurysm. The right facial artery is no longer distinct as it traverses the mass. Within the mass there is a hypoenhancing collection measuring 8.0 x 5.4 x 6.7 cm (series 4, image 57 and series 7, image 69), previously 5.8 x 4.9 x 5.7 cm when remeasured similarly. Again noted is air within the lower density collection, and connection to the skin surface, which is also larger than on prior exam. Pharynx and larynx: Leftward deviation of the pharynx and larynx, with likely involvement of the right aspect of the epiglottis with effacement of the vallecula, as well as the right piriform recess, possibly extending into the right vocal fold. Salivary glands: The left submandibular gland and left parotid gland are unremarkable. The right salivary glands are instinct from the mass. Thyroid: Normal. Lymph nodes: Enlarged right level 1A lymph node (series 4, image 64), measuring to 1.4 cm, previously 0.8 cm. An additional right preauricular lymph node measures up to 1.3 cm in short axis (series 4, image 126), unchanged when remeasured similarly. The mass may be of lymph node origin; no other right neck lymph nodes appear distinct from the mass. No lymphadenopathy in the left neck. Vascular: As described above, the mass abuts distal right common carotid artery (series 4, image 57) and encases the prox right internal carotid artery and external carotid artery (series 4, image 47). Vasculature is otherwise patent. Limited intracranial: Negative. Visualized orbits: Negative.  Mastoids and visualized paranasal sinuses: Clear. Skeleton: As described above, in case mint of the right mandible and right hyoid 5 mass. No evidence of osseous erosion. Degenerative changes in the cervical spine. No acute osseous abnormality. Upper chest: Previously noted ground-glass nodules in the right upper lobe have increased in size and density. The more superior nodule measures 1.4 x 2.2 cm (series 4, image 95), previously 0.8 x 0.9 cm. The more inferior right upper lobe lesion measures 2.4 x 1.5 cm (series 4, image 112), previously 0.7 x 0.8 cm. Additional less well-defined ground-glass and nodular opacities in  the left upper lobe (series 4, images 116-121) and in the left lower lobe (series 4, image 121). IMPRESSION: 1. Significant interval progression of a trans spatial neck mass, now measuring up to 12.3 cm, with extensive necrosis extending to the skin surface. Mass likely involves the right muscles of mastication and platysma, as they are indistinct from the mass. 2. The mass encases the right ICA and ECA and abuts the distal right CCA. No definite evidence of pseudoaneurysm. 3. Invasion of the larynx and pharynx, including the right aspect of the epiglottis, with effacement of the vallecula and right piriform recess. Possible involvement of the right vocal fold. 4. Interval increase in the size of previously noted ground-glass nodules in the right upper lobe, with additional left upper and lower lobe ground-glass nodules, which could be lymphomatous. Consider chest CT for more complete evaluation. Electronically Signed   By: Merilyn Baba M.D.   On: 01/23/2021 12:11        Scheduled Meds:  allopurinol  300 mg Oral Daily   celecoxib  200 mg Oral BID   dexamethasone (DECADRON) injection  4 mg Intravenous Q12H   gabapentin  600 mg Oral TID   Latanoprostene Bunod  1 drop Both Eyes QHS   morphine  30 mg Oral Q12H   phenytoin  200 mg Oral BID   polyethylene glycol  17 g Oral Daily   timolol   1 drop Both Eyes BID   Continuous Infusions:  sodium chloride 50 mL/hr at 01/23/21 1531     LOS: 2 days   Time spent: 39mins Greater than 50% of this time was spent in counseling, explanation of diagnosis, planning of further management, and coordination of care.   Voice Recognition Viviann Spare dictation system was used to create this note, attempts have been made to correct errors. Please contact the author with questions and/or clarifications.   Florencia Reasons, MD PhD FACP [facility]  Available via Epic secure chat 7am-7pm for nonurgent issues Please page for urgent issues To page the attending provider between 7A-7P or the covering provider during after hours 7P-7A, please log into the web site [email or web address] and access using universal Page password for that web site. If you do not have the password, please call the hospital operator.    01/24/2021, 5:37 PM

## 2021-01-24 NOTE — Progress Notes (Signed)
Manufacturing engineer [facility]) Hospital Liaison: RN note    Notified by [facility] [place], RN of patient/family request for [facility] services at [facility] after discharge. Chart and patient information reviewed by [facility] physician. Hospice eligibility confirmed.    Writer spoke with patient's wife, Arbie Cookey to initiate education related to hospice philosophy, services and team approach to care. Arbie Cookey verbalized understanding of information given. Per discussion, plan is for discharge to [facility] by [facility] Monday.   Please send signed and completed DNR form home with patient/family. Patient will need prescriptions for discharge comfort medications.     [facility], Carolynn Sayers, RN is coordinating order for CADD pump prior to discharge. [facility] hospital liaison will be available to assist as needed.   [facility] aware of the above. Please notify [facility] when patient is ready to leave the unit at discharge. (Call [phone] or [phone] after 5pm.) [facility] information and contact numbers given to [facility].      A Please do not hesitate to call with questions.   Thank you,   Farrel Gordon, RN, [place] Hospital Liaison   [phone]

## 2021-01-24 NOTE — Progress Notes (Incomplete)
Pts prescriptions

## 2021-01-25 DIAGNOSIS — C8251 Diffuse follicle center lymphoma, lymph nodes of head, face, and neck: Secondary | ICD-10-CM | POA: Diagnosis not present

## 2021-01-25 MED ORDER — SENNOSIDES-DOCUSATE SODIUM 8.6-50 MG PO TABS
1.0000 | ORAL_TABLET | Freq: Every day | ORAL | Status: DC
  Filled 2021-01-25: qty 1

## 2021-01-25 MED ORDER — CLONIDINE HCL 0.1 MG PO TABS: 0.1000 mg | ORAL_TABLET | Freq: Every evening | ORAL | Status: DC | PRN

## 2021-01-25 NOTE — Progress Notes (Signed)
PROGRESS NOTE    Brian Castillo  HQP:[id number] DOB: May 03, 1940 DOA: 01/29/2021 PCP: Shon Baton, MD    No chief complaint on file.   Brief Narrative:  H/o DLBCL getting monjuvi, last dose appear on 11/7 ,  who has had progressively worsening right neck mass and uncontrolled pain, direct admission from cancer center .  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)   Neck pain   HTN (hypertension)   Seizure disorder (HCC)   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[place])   Hospice care patient   Palliative care patient  DLBCL with large neck mass --He Can not open mouth wide enough to chew due to pain, can talk, no trouble breathing,  Can drink liquid with a straw but cough after liquid intake, does has drooling sometimes on the left conner of the mouth, can not tell if any drooling on the right side due to decreased sensation on right side --Unfortunately patient's recent CT scan showed tumor progression in addition to mass encasing the right ICA and a leaning against distal right CCA, invasion of Larynex, pharynx, and right aspect of epiglottis/vocal cord --Oncology/palliative care on board for pain control , transition to full comfort measures, in the process of setting up outpatient PCA/hospice service  Sweating likely B symptoms from DLBCL He agreed to try clonidine qhs prn   Neutropenia/anemia in the setting of malignancy   Seizures, seizure free for many years Has been on dilantin for many years  Nutritional Assessment: The patient's BMI is: Body mass index is 24.74 kg/m.[name]      Unresulted Labs (From admission, onward)    None         DVT prophylaxis:   Full comfort measures   Code Status: DNR Family Communication:  Wife at bedside daily Disposition:   Status is: Inpatient  Dispo: The [facility]: [facility]              Anticipated d/c is to: [facility]              Anticipated d/c date is: [facility] to [facility] once hospice and outpatient PCA set up                Consultants:  Oncology Palliative care  Procedures:  None  Antimicrobials:    None    Objective: Vitals:   01/23/21 0340 01/24/21 0547 01/24/21 1521 01/25/21 0555  BP: 135/62 (!) 145/71 (!) 151/106 128/65  Pulse: 70 73 76 65  Resp:  18  18  Temp: 98.4 F (36.9 C)   (!) 97.5 F (36.4 C)  TempSrc: Oral   Oral  SpO2: 95% 96% 98% 95%  Weight:      Height:        Intake/Output Summary (Last 24 hours) at 01/25/2021 1402 Last data filed at 01/25/2021 1120 Gross per 24 hour  Intake 480 ml  Output --  Net 480 ml    Filed Weights   02/05/2021 1544  Weight: 73.8 kg    Examination:  General exam: alert, awake, communicative,  large neck mass Respiratory system: Clear to auscultation. Respiratory effort normal. Cardiovascular system:  RRR.  Gastrointestinal system: Abdomen is nondistended, soft and nontender.  Normal bowel sounds  heard. Central nervous system: Alert and oriented. No focal neurological deficits. Extremities:  no edema Skin: large neck mass Psychiatry: Judgement and insight appear normal. Mood & affect appropriate.     Data Reviewed: I have personally reviewed following labs and imaging studies  CBC: Recent Labs  Lab 01/20/21 1133 01/18/2021 1644 01/23/21 0502  WBC 1.5* 1.2* 1.1*  NEUTROABS 0.4* 0.2*  --   HGB 10.6* 9.1* 9.3*  HCT 30.5* 27.1* 27.8*  MCV 90.2 94.1 94.9  PLT 205 191 416    Basic Metabolic Panel: Recent Labs  Lab 01/20/21 1133 02/06/2021 1644 01/23/21 0502  NA 134* 135 135  K 3.4* 3.4* 3.8  CL 99 102 102  CO2 27 26 26   GLUCOSE 123* 105* 110*  BUN 7* 7* 6*  CREATININE 0.68 0.57* 0.54*  CALCIUM 8.3* 8.0* 8.1*  MG  --  1.9  --   PHOS  --  3.0  --      GFR: Estimated Creatinine Clearance: 68.9 mL/min (A) (by C-G formula based on SCr of 0.54 mg/dL (L)).  Liver Function Tests: Recent Labs  Lab 01/20/21 1133 01/19/2021 1644 01/23/21 0502  AST 23 56* 64*  ALT 24 45* 54*  ALKPHOS 126 89 83  BILITOT 0.3 0.6 0.4  PROT 6.0* 5.4* 5.6*  ALBUMIN 2.7* 2.6* 2.6*    CBG: No results for input(s): GLUCAP in the last 168 hours.   Recent Results (from the past 240 hour(s))  Resp Panel by RT-PCR (Flu A&B, Covid) Nasopharyngeal Swab     Status: None   Collection Time: 02/12/2021  5:23 PM   Specimen: Nasopharyngeal Swab; Nasopharyngeal(NP) swabs in vial transport medium  Result Value Ref Range Status   SARS Coronavirus 2 by RT PCR NEGATIVE NEGATIVE Final    Comment: (NOTE) SARS-CoV-2 target nucleic acids are NOT DETECTED.  The SARS-CoV-2 RNA is generally detectable in upper respiratory specimens during the acute phase of infection. The lowest concentration of SARS-CoV-2 viral copies this assay can detect is 138 copies/mL. A negative result does not preclude SARS-Cov-2 infection and should not be used as the sole basis for treatment or other patient management decisions. A negative result may occur with  improper specimen collection/handling, submission of specimen other than nasopharyngeal swab, presence of viral mutation(s) within the areas targeted by this assay, and inadequate number of viral copies(<138 copies/mL). A negative result must be combined with clinical observations, patient history, and epidemiological information. The expected result is Negative.  Fact Sheet for Patients:  EntrepreneurPulse.com.au  Fact Sheet for Healthcare Providers:  IncredibleEmployment.be  This test is no t yet approved or cleared by the [place] FDA and  has been authorized for detection and/or diagnosis of SARS-CoV-2 by FDA under an Emergency Use Authorization (EUA). This EUA will remain  in effect  (meaning this test can be used) for the duration of the COVID-19 declaration under Section 564(b)(1) of the Act, 21 U.S.C.section 360bbb-3(b)(1), unless the authorization is terminated  or revoked sooner.       Influenza A by PCR NEGATIVE NEGATIVE Final   Influenza B by PCR NEGATIVE NEGATIVE Final    Comment: (NOTE) The Xpert Xpress SARS-CoV-2/FLU/RSV plus assay is intended as an aid in the diagnosis of influenza from Nasopharyngeal swab specimens and should not be used as a sole basis for treatment. Nasal washings and aspirates are unacceptable for Xpert Xpress SARS-CoV-2/FLU/RSV testing.  Fact Sheet for Patients: EntrepreneurPulse.com.au  Fact Sheet for Healthcare Providers: IncredibleEmployment.be  This test is not yet approved  or cleared by the [place] and has been authorized for detection and/or diagnosis of SARS-CoV-2 by FDA under an Emergency Use Authorization (EUA). This EUA will remain in effect (meaning this test can be used) for the duration of the COVID-19 declaration under Section 564(b)(1) of the Act, 21 U.S.C. section 360bbb-3(b)(1), unless the authorization is terminated or revoked.  Performed at [facility], [place] [address], [place], [place] [address]          Radiology Studies: No results found.      Scheduled Meds:  allopurinol  300 mg Oral Daily   celecoxib  200 mg Oral BID   dexamethasone (DECADRON) injection  4 mg Intravenous Q12H   gabapentin  600 mg Oral TID   Latanoprostene Bunod  1 drop Both Eyes QHS   morphine  30 mg Oral Q12H   phenytoin  200 mg Oral BID   polyethylene glycol  17 g Oral Daily   timolol  1 drop Both Eyes BID   Continuous Infusions:  sodium chloride 50 mL/hr at 01/23/21 1531     LOS: 3 days   Time spent: 44mins Greater than 50% of this time was spent in counseling, explanation of diagnosis, planning of further management, and coordination of  care.   Voice Recognition Viviann Spare dictation system was used to create this note, attempts have been made to correct errors. Please contact the author with questions and/or clarifications.   Florencia Reasons, MD PhD FACP [facility]  Available via Epic secure chat 7am-7pm for nonurgent issues Please page for urgent issues To page the attending provider between 7A-7P or the covering provider during after hours 7P-7A, please log into the web site [email or web address] and access using universal [place] password for that web site. If you do not have the password, please call the hospital operator.    01/25/2021, 2:02 PM

## 2021-01-25 NOTE — Progress Notes (Signed)
[facility]) Hospital Liaison Note  Plan remains for discharge to [facility] by [facility] Monday. Hospice services to be initiated once patient is discharged to [facility]. [facility], Carolynn Sayers, RN is coordinating order for CADD pump prior to discharge.   Please notify [facility] when patient is ready to leave the unit at discharge. (Call [phone] or [phone] after 5pm.)       Please do not hesitate to call with any hospice related questions or concerns.   Thank you,   Bobbie "Loren Racer, [place], BSN [facility] Liaison [phone]

## 2021-01-26 DIAGNOSIS — G40909 Epilepsy, unspecified, not intractable, without status epilepticus: Secondary | ICD-10-CM | POA: Diagnosis not present

## 2021-01-26 DIAGNOSIS — M542 Cervicalgia: Secondary | ICD-10-CM | POA: Diagnosis not present

## 2021-01-26 DIAGNOSIS — I10 Essential (primary) hypertension: Secondary | ICD-10-CM | POA: Diagnosis not present

## 2021-01-26 DIAGNOSIS — C8251 Diffuse follicle center lymphoma, lymph nodes of head, face, and neck: Secondary | ICD-10-CM | POA: Diagnosis not present

## 2021-01-26 MED ORDER — SODIUM CHLORIDE 0.9 % IV SOLN
200.0000 mg | Freq: Two times a day (BID) | INTRAVENOUS | Status: DC
  Administered 2021-01-26: 22:00:00 200 mg via INTRAVENOUS
  Filled 2021-01-26 (×4): qty 4

## 2021-01-26 NOTE — Progress Notes (Signed)
Daily Progress Note   Patient Name: Brian Castillo       Date: 01/26/2021 DOB: 1940/04/04  Age: 80 y.o. MRN#: [id number] Attending Physician: Florencia Reasons, MD Primary Care Physician: Shon Baton, MD Admit Date: 01/20/2021  Reason for Consultation/Follow-up: Establishing goals of care, Non pain symptom management, Pain control, Psychosocial/spiritual support, and Terminal Care  Subjective: Chart Reviewed. Updates Received. Patient Assessed.   Brian Castillo is awake, alert, and oriented x3 sitting up in bed. States this is the best he has felt in months on today. He has been able to enjoy his meals. Feels he is able to open his mouth a little more with comfort. He is appreciative of his effective pain control. Brian Castillo is at the bedside also.   We reviewed goals of care with plans for discharge tomorrow back to [facility] if approved and initiation of CADD PCA pump for ongoing pain control with outpatient hospice support. Patient and wife verbalized understanding.   Brian Castillo shares he is at peace with his decision however given how great he is feeling today he is concerned if he is making the right decisions. I empathetically acknowledged his concerns with further discussions regarding his disease trajectory and maximized treatment efforts. He and wife verbalized understanding. They requested to also speak with Dr. Lorenso Courier prior to discharging to make sure he was comfortable with final plans and no other suggestions. They are aware I will speak with him tomorrow and request follow-up.   Education provided on symptom management and possible reasoning for him feeling so well. Plans will continue to focus on his comfort and evaluate tomorrow for ability to proceed with discharge as planned.   Brian Castillo and his wife verbalized understanding and appreciation of all care and support.    Length of Stay: 4 days  Vital Signs: BP (!) 141/66 (BP Location: Right Arm)   Pulse 67   Temp (!) 97.4 F (36.3 C) (Oral)    Resp 16   Ht 5\' 8"  (1.727 m)   Wt 73.8 kg   SpO2 92%   BMI 24.74 kg/m  SpO2: SpO2: 92 % O2 Device: O2 Device: Room Air O2 Flow Rate:    Physical Exam: NAD RRR Diminished bilaterally Severe right-sided fungating facial mass extending into right side of mouth and across neck/throat area, dressing clean dry and intact AAOx3, mood appropriate        Palliative Care Assessment & Plan   Code Status: DNR  Goals of Care/Recommendations: Continue comfort focused care Tentative plan for patient to discharge back to [facility] on Monday if approved with outpatient hospice support ([facility]). I have spoken at length with medical team with plans to discharge patient to facility on IV Dilaudid PCA for continued pain management in the setting of hospice/comfort focused care.  Prescription has been signed and placed on chart. Comfort medications have been sent to local pharmacy and patient also has some medications in the home that was prescribed prior to admission.  This includes MS Contin, Celebrex, Ativan. Patient requesting final discussions with Dr. Lorenso Courier (Oncologist) on tomorrow to confirm all options have been exhausted.  Education and support provided on continuous symptom management. Which he confirms is effective.  PMT will continue to support and follow.    Prognosis: Weeks  Discharge Planning:  [facility] with [facility] hospice support   Thank you for allowing the Palliative Medicine Team to assist in the care of this patient.  Time Total: 45 min.   Visit consisted of counseling and  education dealing with the complex and emotionally intense issues of symptom management and palliative care in the setting of serious and potentially life-threatening illness.Greater than 50%  of this time was spent counseling and coordinating care related to the above assessment and plan.  Alda Lea, AGPCNP-BC  Palliative Medicine Team [phone]

## 2021-01-26 NOTE — Progress Notes (Signed)
PROGRESS NOTE    Brian Castillo  DSK:[id number] DOB: Apr 14, 1940 DOA: 01/16/2021 PCP: Shon Baton, MD    No chief complaint on file.   Brief Narrative:  H/o DLBCL getting monjuvi, last dose appear on 11/7 ,  who has had progressively worsening right neck mass and uncontrolled pain, direct admission from cancer center .  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)   Neck pain   HTN (hypertension)   Seizure disorder (HCC)   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[place])   Hospice care patient   Palliative care patient  DLBCL with large neck mass --He Can not open mouth wide enough to chew due to pain, can talk, no trouble breathing,  Can drink liquid with a straw but cough after liquid intake, does has drooling sometimes on the left conner of the mouth, can not tell if any drooling on the right side due to decreased sensation on right side --Unfortunately patient's recent CT scan showed tumor progression in addition to mass encasing the right ICA and a leaning against distal right CCA, invasion of Larynex, pharynx, and right aspect of epiglottis/vocal cord --Oncology/palliative care on board for pain control , transition to full comfort measures, in the process of setting up outpatient PCA/hospice service  Sweating likely B symptoms from DLBCL He agreed to try clonidine qhs prn , other options  including gabapentin , SSRI, Paxil discussed with patient  Neutropenia/anemia in the setting of malignancy   Seizures, seizure free for many years Has been on dilantin for many years  Nutritional Assessment: The patient's BMI is: Body mass index is 24.74 kg/m.[name]      Unresulted Labs (From admission, onward)    None          DVT prophylaxis:   Full comfort measures   Code Status: DNR Family Communication: Wife at bedside daily Disposition:   Status is: Inpatient  Dispo: The [facility]              Anticipated d/c is to: [facility]              Anticipated d/c date is: Return to [facility] once hospice and outpatient PCA set up, hopefully on Monday                Consultants:  Oncology Palliative care  Procedures:  None  Antimicrobials:    None    Objective: Vitals:   01/24/21 0547 01/24/21 1521 01/25/21 0555 01/26/21 0452  BP: (!) 145/71 (!) 151/106 128/65 (!) 141/66  Pulse: 73 76 65 67  Resp: 18  18 16   Temp:   (!) 97.5 F (36.4 C) (!) 97.4 F (36.3 C)  TempSrc:   Oral Oral  SpO2: 96% 98% 95% 92%  Weight:      Height:        Intake/Output Summary (Last 24 hours) at 01/26/2021 1633 Last data filed at 01/26/2021 1500 Gross per 24 hour  Intake 600 ml  Output --  Net 600 ml    Filed Weights   01/16/2021 1544  Weight: 73.8 kg    Examination:  General exam: alert, awake, communicative,  large neck mass Respiratory system: Clear to auscultation. Respiratory effort normal.  Cardiovascular system:  RRR.  Gastrointestinal system: Abdomen is nondistended, soft and nontender.  Normal bowel sounds heard. Central nervous system: Alert and oriented. No focal neurological deficits. Extremities:  no edema Skin: large neck mass Psychiatry: Judgement and insight appear normal. Mood & affect appropriate.     Data Reviewed: I have personally reviewed following labs and imaging studies  CBC: Recent Labs  Lab 01/20/21 1133 02/09/2021 1644 01/23/21 0502  WBC 1.5* 1.2* 1.1*  NEUTROABS 0.4* 0.2*  --   HGB 10.6* 9.1* 9.3*  HCT 30.5* 27.1* 27.8*  MCV 90.2 94.1 94.9  PLT 205 191 937    Basic Metabolic Panel: Recent Labs  Lab 01/20/21 1133 01/16/2021 1644 01/23/21 0502  NA 134* 135 135  K 3.4* 3.4* 3.8  CL 99 102 102  CO2 27 26 26   GLUCOSE 123*  105* 110*  BUN 7* 7* 6*  CREATININE 0.68 0.57* 0.54*  CALCIUM 8.3* 8.0* 8.1*  MG  --  1.9  --   PHOS  --  3.0  --     GFR: Estimated Creatinine Clearance: 68.9 mL/min (A) (by C-G formula based on SCr of 0.54 mg/dL (L)).  Liver Function Tests: Recent Labs  Lab 01/20/21 1133 02/11/2021 1644 01/23/21 0502  AST 23 56* 64*  ALT 24 45* 54*  ALKPHOS 126 89 83  BILITOT 0.3 0.6 0.4  PROT 6.0* 5.4* 5.6*  ALBUMIN 2.7* 2.6* 2.6*    CBG: No results for input(s): GLUCAP in the last 168 hours.   Recent Results (from the past 240 hour(s))  Resp Panel by RT-PCR (Flu A&B, Covid) Nasopharyngeal Swab     Status: None   Collection Time: 01/21/2021  5:23 PM   Specimen: Nasopharyngeal Swab; Nasopharyngeal(NP) swabs in vial transport medium  Result Value Ref Range Status   SARS Coronavirus 2 by RT PCR NEGATIVE NEGATIVE Final    Comment: (NOTE) SARS-CoV-2 target nucleic acids are NOT DETECTED.  The SARS-CoV-2 RNA is generally detectable in upper respiratory specimens during the acute phase of infection. The lowest concentration of SARS-CoV-2 viral copies this assay can detect is 138 copies/mL. A negative result does not preclude SARS-Cov-2 infection and should not be used as the sole basis for treatment or other patient management decisions. A negative result may occur with  improper specimen collection/handling, submission of specimen other than nasopharyngeal swab, presence of viral mutation(s) within the areas targeted by this assay, and inadequate number of viral copies(<138 copies/mL). A negative result must be combined with clinical observations, patient history, and epidemiological information. The expected result is Negative.  Fact Sheet for Patients:  EntrepreneurPulse.com.au  Fact Sheet for Healthcare Providers:  IncredibleEmployment.be  This test is no t yet approved or cleared by the [place] FDA and  has been authorized for detection  and/or diagnosis of SARS-CoV-2 by FDA under an Emergency Use Authorization (EUA). This EUA will remain  in effect (meaning this test can be used) for the duration of the COVID-19 declaration under Section 564(b)(1) of the Act, 21 U.S.C.section 360bbb-3(b)(1), unless the authorization is terminated  or revoked sooner.       Influenza A by PCR NEGATIVE NEGATIVE Final   Influenza B by PCR NEGATIVE NEGATIVE Final    Comment: (NOTE) The Xpert Xpress SARS-CoV-2/FLU/RSV plus assay is intended as an aid in the diagnosis of influenza from Nasopharyngeal swab specimens and should not be used as a sole basis for treatment. Nasal washings and aspirates are unacceptable for Xpert Xpress SARS-CoV-2/FLU/RSV testing.  Fact Sheet  for Patients: EntrepreneurPulse.com.au  Fact Sheet for Healthcare Providers: IncredibleEmployment.be  This test is not yet approved or cleared by the [place] FDA and has been authorized for detection and/or diagnosis of SARS-CoV-2 by FDA under an Emergency Use Authorization (EUA). This EUA will remain in effect (meaning this test can be used) for the duration of the COVID-19 declaration under Section 564(b)(1) of the Act, 21 U.S.C. section 360bbb-3(b)(1), unless the authorization is terminated or revoked.  Performed at [facility], [name] [address], [place], [place] [address]          Radiology Studies: No results found.      Scheduled Meds:  allopurinol  300 mg Oral Daily   celecoxib  200 mg Oral BID   dexamethasone (DECADRON) injection  4 mg Intravenous Q12H   gabapentin  600 mg Oral TID   Latanoprostene Bunod  1 drop Both Eyes QHS   morphine  30 mg Oral Q12H   phenytoin  200 mg Oral BID   polyethylene glycol  17 g Oral Daily   senna-docusate  1 tablet Oral QHS   timolol  1 drop Both Eyes BID   Continuous Infusions:  sodium chloride 50 mL/hr at 01/23/21 1531     LOS: 4 days   Time  spent: 22mins, case discussed with palliative care at bedside Greater than 50% of this time was spent in counseling, explanation of diagnosis, planning of further management, and coordination of care.   Voice Recognition Viviann Spare dictation system was used to create this note, attempts have been made to correct errors. Please contact the author with questions and/or clarifications.   Florencia Reasons, MD PhD FACP [facility]  Available via Epic secure chat 7am-7pm for nonurgent issues Please page for urgent issues To page the attending provider between 7A-7P or the covering provider during after hours 7P-7A, please log into the web site [email or web address] and access using universal [place] password for that web site. If you do not have the password, please call the hospital operator.    01/26/2021, 4:33 PM

## 2021-01-27 ENCOUNTER — Inpatient Hospital Stay: Payer: Medicare Other | Admitting: Nurse Practitioner

## 2021-01-27 DIAGNOSIS — I10 Essential (primary) hypertension: Secondary | ICD-10-CM | POA: Diagnosis not present

## 2021-01-27 DIAGNOSIS — G40909 Epilepsy, unspecified, not intractable, without status epilepticus: Secondary | ICD-10-CM | POA: Diagnosis not present

## 2021-01-27 DIAGNOSIS — M542 Cervicalgia: Secondary | ICD-10-CM | POA: Diagnosis not present

## 2021-01-27 DIAGNOSIS — C8331 Diffuse large B-cell lymphoma, lymph nodes of head, face, and neck: Secondary | ICD-10-CM | POA: Diagnosis not present

## 2021-01-27 DIAGNOSIS — C8251 Diffuse follicle center lymphoma, lymph nodes of head, face, and neck: Secondary | ICD-10-CM | POA: Diagnosis not present

## 2021-01-27 DIAGNOSIS — G893 Neoplasm related pain (acute) (chronic): Secondary | ICD-10-CM | POA: Diagnosis not present

## 2021-01-27 MED ORDER — HYDROMORPHONE BOLUS VIA INFUSION
2.0000 mg | INTRAVENOUS | Status: DC | PRN
  Filled 2021-01-27: qty 4

## 2021-01-27 MED ORDER — SCOPOLAMINE 1 MG/3DAYS TD PT72
1.0000 | MEDICATED_PATCH | TRANSDERMAL | Status: DC
  Administered 2021-01-27: 1.5 mg via TRANSDERMAL
  Filled 2021-01-27: qty 1

## 2021-01-27 MED ORDER — SODIUM CHLORIDE 0.9 % IV SOLN
2.0000 mg/h | INTRAVENOUS | Status: DC
  Administered 2021-01-27: 1 mg/h via INTRAVENOUS
  Administered 2021-01-28: 2 mg/h via INTRAVENOUS
  Filled 2021-01-27 (×4): qty 2.5

## 2021-01-27 MED ORDER — GLYCOPYRROLATE 0.2 MG/ML IJ SOLN: 0.3000 mg | INTRAMUSCULAR | Status: AC

## 2021-01-27 MED ORDER — GLYCOPYRROLATE 0.2 MG/ML IJ SOLN
0.4000 mg | INTRAMUSCULAR | Status: DC
  Administered 2021-01-27 – 2021-01-28 (×4): 0.4 mg via INTRAVENOUS
  Filled 2021-01-27 (×4): qty 2

## 2021-01-27 MED ORDER — LORAZEPAM 2 MG/ML IJ SOLN: 1.0000 mg | INTRAMUSCULAR | Status: DC | PRN

## 2021-01-27 MED ORDER — MORPHINE SULFATE (PF) 2 MG/ML IV SOLN: 2.0000 mg | INTRAVENOUS | Status: DC | PRN

## 2021-01-27 MED ORDER — HYDROMORPHONE BOLUS VIA INFUSION
1.0000 mg | INTRAVENOUS | Status: DC | PRN
Start: 2021-01-27 — End: 2021-01-27
  Filled 2021-01-27: qty 2

## 2021-01-27 MED ORDER — SCOPOLAMINE 1 MG/3DAYS TD PT72: 1.0000 | MEDICATED_PATCH | TRANSDERMAL | 12 refills | Status: AC

## 2021-01-27 MED ORDER — SENNOSIDES-DOCUSATE SODIUM 8.6-50 MG PO TABS: 1.0000 | ORAL_TABLET | Freq: Every evening | ORAL | Status: DC | PRN

## 2021-01-27 MED ORDER — GLYCOPYRROLATE 0.2 MG/ML IJ SOLN
0.3000 mg | INTRAMUSCULAR | Status: DC
  Administered 2021-01-27 (×2): 0.3 mg via INTRAVENOUS
  Filled 2021-01-27 (×2): qty 2

## 2021-01-27 MED ORDER — HALOPERIDOL LACTATE 5 MG/ML IJ SOLN
2.0000 mg | Freq: Four times a day (QID) | INTRAMUSCULAR | Status: DC | PRN
  Administered 2021-01-27: 2 mg via INTRAVENOUS
  Filled 2021-01-27: qty 1

## 2021-01-27 MED ORDER — LORAZEPAM 2 MG/ML IJ SOLN
1.0000 mg | INTRAMUSCULAR | Status: DC
  Administered 2021-01-27 (×2): 1 mg via INTRAVENOUS
  Filled 2021-01-27 (×2): qty 1

## 2021-01-27 MED ORDER — HALOPERIDOL LACTATE 5 MG/ML IJ SOLN: 2.0000 mg | INTRAMUSCULAR | Status: DC | PRN

## 2021-01-27 MED ORDER — LORAZEPAM 2 MG/ML IJ SOLN
2.0000 mg | INTRAMUSCULAR | Status: DC
  Administered 2021-01-27 – 2021-01-28 (×4): 2 mg via INTRAVENOUS
  Filled 2021-01-27 (×4): qty 1

## 2021-01-27 NOTE — Discharge Summary (Signed)
Physician Discharge Summary  Brian Castillo HBZ:[id number] DOB: 01/03/1941 DOA: 02/10/2021  PCP: Shon Baton, MD  Admit date: 01/23/2021 Discharge date: 01/27/2021  Admitted From: Home Disposition:  Residential Hospice  Discharge Condition:Guarded CODE STATUS:Comfort Care Diet recommendation:Dysphagia 1  Brief/Interim Summary: Patient is a 80 year old male with H/o DLBCL getting monjuvi, last dose appear on 11/7 ,  who has had progressively worsening right neck mass and uncontrolled pain, direct admission from cancer center .  His recent CT imaging showed tumor progression.  Oncology, palliative care were following and recommended hospice approach.  Currently he is on full comfort care.  Initially ,he was planned to be discharged to nursing facility with hospice but since he is significantly declined, we discuss goals of care again with family and the current plan is to transfer him to residential hospice.  Waiting for residential hospice bed.  Following problems were addressed during his hospitalization:  DLBCL with large neck mass --He Can not open mouth wide enough to chew due to pain, can talk, no trouble breathing,  Can drink liquid with a straw but cough after liquid intake, does has drooling sometimes on the left conner of the mouth, can not tell if any drooling on the right side due to decreased sensation on right side --Unfortunately patient's recent CT scan showed tumor progression in addition to mass encasing the right ICA and a leaning against distal right CCA, invasion of Larynex, pharynx, and right aspect of epiglottis/vocal cord --Oncology/palliative care on board for pain control , transition to full comfort measures   Sweating : likely B symptoms from DLBCL   Neutropenia/anemia : Secondary to  malignancy   Seizures: seizure free for many years Has been on dilantin for many years  Discharge Diagnoses:  Active Problems:   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   Discharge wound care:   Complete by: As directed    As per wound care nurse   Increase activity slowly   Complete by: As directed    Increase activity slowly   Complete by: As directed       Allergies as of 01/27/2021       Reactions   Brimonidine Tartrate-timolol Other (See Comments)   Other reaction(s): Swelling in eyes   Lisinopril Hives, Other (See Comments)   Other reaction(s): swelling of lips (undetermined)   Netarsudil Dimesylate Other (See Comments)   Other reaction(s): Swelliing in eyes        Medication List     STOP taking these medications    doxycycline 100 MG capsule Commonly known as: VIBRAMYCIN   HYDROmorphone 2 MG tablet Commonly known as: Dilaudid       TAKE these medications    acetaminophen 325 MG tablet Commonly known as: TYLENOL Take 2 tablets (650 mg total) by mouth every 4 (four) hours as needed for mild pain, moderate pain, fever or headache (or Fever >/= 101).  allopurinol 300 MG tablet Commonly known as: ZYLOPRIM TAKE 1 TABLET BY MOUTH EVERY DAY   aspirin 81 MG tablet Take 81 mg by mouth daily.   CALCIUM 600 + D PO Take 1 tablet by mouth daily.   celecoxib 200 MG capsule Commonly known as: CeleBREX Take 1 capsule (200 mg total) by mouth 2 (two) times daily. What changed: medication strength   cholecalciferol 1000 units tablet Commonly known as: VITAMIN D Take 1,000 Units by mouth daily.   gabapentin 300 MG capsule Commonly known as:  NEURONTIN Take 2 capsules (600 mg total) by mouth 3 (three) times daily.   HYDROmorphone 1 mg/mL injection Commonly known as: DILAUDID Hydromorphone IV PCA- 500 mg (One week supply) Basal Rate-0.5 mg/hr  Bolus dose: 0.5 mg  Lockout: 15 minutes One-hour dose limit: 2mg   Please dispense 500mg  of IV hydromorphone (one week supply).  Dispense volume and concentration to be determined by pharmacy.   Hospice Patient/Terminal Illness Diagnosis: Progressive High grade B-cell lymphoma, Follicular lymphoma, Cancer related pain   lenalidomide 25 MG capsule Commonly known as: REVLIMID Take 1 capsule (25 mg total) by mouth daily. Take for 21 days, then none for 7 days. Repeat as ordered. [employer] Auth # [id number]; Date Obtained 01/17/21   lidocaine-prilocaine cream Commonly known as: EMLA Apply 1 application topically as needed. What changed:  when to take this reasons to take this   LORazepam 1 MG tablet Commonly known as: ATIVAN Take 1 tablet (1 mg total) by mouth every 6 (six) hours as needed for anxiety. Hospice Patient   morphine 30 MG 12 hr tablet Commonly known as: MS Contin Take 1 tablet (30 mg total) by mouth every 12 (twelve) hours. Hospice Patient What changed:  medication strength how much to take additional instructions   phenytoin 100 MG ER capsule Commonly known as: DILANTIN Take 200 mg by mouth 2 (two) times daily.   polyethylene glycol 17 g packet Commonly known as: MIRALAX / GLYCOLAX Take 17 g by mouth daily.   rosuvastatin 20 MG tablet Commonly known as: CRESTOR Take 20 mg by mouth daily.   timolol 0.5 % ophthalmic solution Commonly known as: BETIMOL Place 1 drop into both eyes 2 (two) times daily.   Vyzulta 0.024 % Soln Generic drug: Latanoprostene Bunod Place 1 drop into both eyes at bedtime.               Discharge Care Instructions  (From admission, onward)           Start     Ordered   01/27/21 0000  Discharge wound care:        Comments: As per wound care nurse   01/27/21 0947   01/24/21 0000  Discharge wound care:       Comments: Cleanse with NS, pat dry, cover with ABD and secure with paper tape, change daily, may wet Aquacel Ag+ Advantage to remove if needed/adhering.   01/24/21 0928            Allergies  Allergen Reactions   Brimonidine Tartrate-Timolol Other (See Comments)    Other reaction(s): Swelling in eyes   Lisinopril Hives and Other (See Comments)    Other reaction(s): swelling of lips (undetermined)   Netarsudil Dimesylate Other (See Comments)    Other reaction(s): Swelliing in eyes    Consultations: Oncology, palliative care   Procedures/Studies: CT SOFT TISSUE NECK W CONTRAST  Result Date: 01/23/2021 CLINICAL DATA:  Hematologic malignancy, surveillance EXAM: CT NECK WITH CONTRAST TECHNIQUE: Multidetector CT  imaging of the neck was performed using the standard protocol following the bolus administration of intravenous contrast. CONTRAST:  54mL OMNIPAQUE IOHEXOL 350 MG/ML SOLN COMPARISON:  12/10/2020 FINDINGS: Redemonstrated right face and neck mass, measuring up to 12.3 x 8.2 x 8.4 cm (AP x TR x CC) (series 4, image 45 and series 7, image 69), previously 8.4 x 6.3 x 6.8 cm when remeasured similarly. The mass was previously noted to involve the right parotid and submandibular spaces, now invading the right masticator space, right buccal space, and right sublingual space. The mass also invades the right mucosal pharyngeal space and visceral space of the neck (series 4, image 58 and series 7, image 77), involving the right aspect of the epiglottis (series 4, image 48) and larynx. The mass encases the right mandible (series 4, image 43) and right aspect of the hyoid (series 4, image 49), without definite osseous erosion. The right muscles of mastication and platysma are indistinct from the mass. The mass now abuts the right distal common carotid artery and encases proximal right internal and  external carotid arteries (series 4, image 46), although they remain patent and without visible pseudoaneurysm. The right facial artery is no longer distinct as it traverses the mass. Within the mass there is a hypoenhancing collection measuring 8.0 x 5.4 x 6.7 cm (series 4, image 57 and series 7, image 69), previously 5.8 x 4.9 x 5.7 cm when remeasured similarly. Again noted is air within the lower density collection, and connection to the skin surface, which is also larger than on prior exam. Pharynx and larynx: Leftward deviation of the pharynx and larynx, with likely involvement of the right aspect of the epiglottis with effacement of the vallecula, as well as the right piriform recess, possibly extending into the right vocal fold. Salivary glands: The left submandibular gland and left parotid gland are unremarkable. The right salivary glands are instinct from the mass. Thyroid: Normal. Lymph nodes: Enlarged right level 1A lymph node (series 4, image 64), measuring to 1.4 cm, previously 0.8 cm. An additional right preauricular lymph node measures up to 1.3 cm in short axis (series 4, image 126), unchanged when remeasured similarly. The mass may be of lymph node origin; no other right neck lymph nodes appear distinct from the mass. No lymphadenopathy in the left neck. Vascular: As described above, the mass abuts distal right common carotid artery (series 4, image 57) and encases the prox right internal carotid artery and external carotid artery (series 4, image 47). Vasculature is otherwise patent. Limited intracranial: Negative. Visualized orbits: Negative. Mastoids and visualized paranasal sinuses: Clear. Skeleton: As described above, in case mint of the right mandible and right hyoid 5 mass. No evidence of osseous erosion. Degenerative changes in the cervical spine. No acute osseous abnormality. Upper chest: Previously noted ground-glass nodules in the right upper lobe have increased in size and density. The  more superior nodule measures 1.4 x 2.2 cm (series 4, image 95), previously 0.8 x 0.9 cm. The more inferior right upper lobe lesion measures 2.4 x 1.5 cm (series 4, image 112), previously 0.7 x 0.8 cm. Additional less well-defined ground-glass and nodular opacities in the left upper lobe (series 4, images 116-121) and in the left lower lobe (series 4, image 121). IMPRESSION: 1. Significant interval progression of a trans spatial neck mass, now measuring up to 12.3 cm, with extensive necrosis extending to the skin surface. Mass likely involves the right muscles of mastication and platysma, as they are indistinct from the  mass. 2. The mass encases the right ICA and ECA and abuts the distal right CCA. No definite evidence of pseudoaneurysm. 3. Invasion of the larynx and pharynx, including the right aspect of the epiglottis, with effacement of the vallecula and right piriform recess. Possible involvement of the right vocal fold. 4. Interval increase in the size of previously noted ground-glass nodules in the right upper lobe, with additional left upper and lower lobe ground-glass nodules, which could be lymphomatous. Consider chest CT for more complete evaluation. Electronically Signed   By: Merilyn Baba M.D.   On: 01/23/2021 12:11      Subjective: Patient seen and examined at the bedside this morning.  Family at the bedside.  Looks very deconditioned, lying in bed.  As per RN, he has been unable to operate the PCA pump.  We discussed about his decline, we discussed about goals of care.  Family interested to know about residential hospice.  Discharge Exam: Vitals:   01/26/21 2113 01/27/21 0521  BP: (!) 151/58 (!) 154/65  Pulse: 100 88  Resp: 18 16  Temp: (!) 103 F (39.4 C) 98.9 F (37.2 C)  SpO2: (!) 86% 94%   Vitals:   01/25/21 0555 01/26/21 0452 01/26/21 2113 01/27/21 0521  BP: 128/65 (!) 141/66 (!) 151/58 (!) 154/65  Pulse: 65 67 100 88  Resp: 18 16 18 16   Temp: (!) 97.5 F (36.4 C) (!) 97.4  F (36.3 C) (!) 103 F (39.4 C) 98.9 F (37.2 C)  TempSrc: Oral Oral Oral Oral  SpO2: 95% 92% (!) 86% 94%  Weight:      Height:        General exam: Very deconditioned, chronically ill looking, weak HEENT: Large mass on the right neck Respiratory system:  no obvious wheezes or crackles  Cardiovascular system: S1 & S2 heard, RRR.  Gastrointestinal system: Abdomen is nondistended, soft and nontender. Central nervous system: Not alert or oriented Extremities: No edema, no clubbing ,no cyanosis Skin: No rashes, no ulcers,no icterus      The results of significant diagnostics from this hospitalization (including imaging, microbiology, ancillary and laboratory) are listed below for reference.     Microbiology: Recent Results (from the past 240 hour(s))  Resp Panel by RT-PCR (Flu A&B, Covid) Nasopharyngeal Swab     Status: None   Collection Time: 01/15/2021  5:23 PM   Specimen: Nasopharyngeal Swab; Nasopharyngeal(NP) swabs in vial transport medium  Result Value Ref Range Status   SARS Coronavirus 2 by RT PCR NEGATIVE NEGATIVE Final    Comment: (NOTE) SARS-CoV-2 target nucleic acids are NOT DETECTED.  The SARS-CoV-2 RNA is generally detectable in upper respiratory specimens during the acute phase of infection. The lowest concentration of SARS-CoV-2 viral copies this assay can detect is 138 copies/mL. A negative result does not preclude SARS-Cov-2 infection and should not be used as the sole basis for treatment or other patient management decisions. A negative result may occur with  improper specimen collection/handling, submission of specimen other than nasopharyngeal swab, presence of viral mutation(s) within the areas targeted by this assay, and inadequate number of viral copies(<138 copies/mL). A negative result must be combined with clinical observations, patient history, and epidemiological information. The expected result is Negative.  Fact Sheet for Patients:   EntrepreneurPulse.com.au  Fact Sheet for Healthcare Providers:  IncredibleEmployment.be  This test is no t yet approved or cleared by the [place] FDA and  has been authorized for detection and/or diagnosis of SARS-CoV-2 by FDA under an  Emergency Use Authorization (EUA). This EUA will remain  in effect (meaning this test can be used) for the duration of the COVID-19 declaration under Section 564(b)(1) of the Act, 21 U.S.C.section 360bbb-3(b)(1), unless the authorization is terminated  or revoked sooner.       Influenza A by PCR NEGATIVE NEGATIVE Final   Influenza B by PCR NEGATIVE NEGATIVE Final    Comment: (NOTE) The Xpert Xpress SARS-CoV-2/FLU/RSV plus assay is intended as an aid in the diagnosis of influenza from Nasopharyngeal swab specimens and should not be used as a sole basis for treatment. Nasal washings and aspirates are unacceptable for Xpert Xpress SARS-CoV-2/FLU/RSV testing.  Fact Sheet for Patients: EntrepreneurPulse.com.au  Fact Sheet for Healthcare Providers: IncredibleEmployment.be  This test is not yet approved or cleared by the [place] FDA and has been authorized for detection and/or diagnosis of SARS-CoV-2 by FDA under an Emergency Use Authorization (EUA). This EUA will remain in effect (meaning this test can be used) for the duration of the COVID-19 declaration under Section 564(b)(1) of the Act, 21 U.S.C. section 360bbb-3(b)(1), unless the authorization is terminated or revoked.  Performed at [facility], [place] [address] [place], [place] [address]      Labs: BNP (last 3 results) No results for input(s): BNP in the last 8760 hours. Basic Metabolic Panel: Recent Labs  Lab 01/20/21 1133 02/03/2021 1644 01/23/21 0502  NA 134* 135 135  K 3.4* 3.4* 3.8  CL 99 102 102  CO2 27 26 26   GLUCOSE 123* 105* 110*  BUN 7* 7* 6*  CREATININE 0.68 0.57*  0.54*  CALCIUM 8.3* 8.0* 8.1*  MG  --  1.9  --   PHOS  --  3.0  --    Liver Function Tests: Recent Labs  Lab 01/20/21 1133 01/23/2021 1644 01/23/21 0502  AST 23 56* 64*  ALT 24 45* 54*  ALKPHOS 126 89 83  BILITOT 0.3 0.6 0.4  PROT 6.0* 5.4* 5.6*  ALBUMIN 2.7* 2.6* 2.6*   No results for input(s): LIPASE, AMYLASE in the last 168 hours. No results for input(s): AMMONIA in the last 168 hours. CBC: Recent Labs  Lab 01/20/21 1133 01/26/2021 1644 01/23/21 0502  WBC 1.5* 1.2* 1.1*  NEUTROABS 0.4* 0.2*  --   HGB 10.6* 9.1* 9.3*  HCT 30.5* 27.1* 27.8*  MCV 90.2 94.1 94.9  PLT 205 191 181   Cardiac Enzymes: No results for input(s): CKTOTAL, CKMB, CKMBINDEX, TROPONINI in the last 168 hours. BNP: Invalid input(s): POCBNP CBG: No results for input(s): GLUCAP in the last 168 hours. D-Dimer No results for input(s): DDIMER in the last 72 hours. Hgb A1c No results for input(s): HGBA1C in the last 72 hours. Lipid Profile No results for input(s): CHOL, HDL, LDLCALC, TRIG, CHOLHDL, LDLDIRECT in the last 72 hours. Thyroid function studies No results for input(s): TSH, T4TOTAL, T3FREE, THYROIDAB in the last 72 hours.  Invalid input(s): FREET3 Anemia work up No results for input(s): VITAMINB12, FOLATE, FERRITIN, TIBC, IRON, RETICCTPCT in the last 72 hours. Urinalysis    Component Value Date/Time   COLORURINE YELLOW 06/25/2020 1950   APPEARANCEUR CLEAR 06/25/2020 1950   LABSPEC 1.018 06/25/2020 1950   PHURINE 5.5 06/25/2020 1950   GLUCOSEU NEGATIVE 06/25/2020 1950   HGBUR LARGE (A) 06/25/2020 1950   BILIRUBINUR NEGATIVE 06/25/2020 1950   KETONESUR NEGATIVE 06/25/2020 1950   PROTEINUR TRACE (A) 06/25/2020 1950   UROBILINOGEN 0.2 03/06/2016 1251   NITRITE NEGATIVE 06/25/2020 [place] NEGATIVE 06/25/2020 1950  Sepsis Labs Invalid input(s): PROCALCITONIN,  WBC,  LACTICIDVEN Microbiology Recent Results (from the past 240 hour(s))  Resp Panel by RT-PCR (Flu A&B, Covid)  Nasopharyngeal Swab     Status: None   Collection Time: 02/09/2021  5:23 PM   Specimen: Nasopharyngeal Swab; Nasopharyngeal(NP) swabs in vial transport medium  Result Value Ref Range Status   SARS Coronavirus 2 by RT PCR NEGATIVE NEGATIVE Final    Comment: (NOTE) SARS-CoV-2 target nucleic acids are NOT DETECTED.  The SARS-CoV-2 RNA is generally detectable in upper respiratory specimens during the acute phase of infection. The lowest concentration of SARS-CoV-2 viral copies this assay can detect is 138 copies/mL. A negative result does not preclude SARS-Cov-2 infection and should not be used as the sole basis for treatment or other patient management decisions. A negative result may occur with  improper specimen collection/handling, submission of specimen other than nasopharyngeal swab, presence of viral mutation(s) within the areas targeted by this assay, and inadequate number of viral copies(<138 copies/mL). A negative result must be combined with clinical observations, patient history, and epidemiological information. The expected result is Negative.  Fact Sheet for Patients:  EntrepreneurPulse.com.au  Fact Sheet for Healthcare Providers:  IncredibleEmployment.be  This test is no t yet approved or cleared by the [place] FDA and  has been authorized for detection and/or diagnosis of SARS-CoV-2 by FDA under an Emergency Use Authorization (EUA). This EUA will remain  in effect (meaning this test can be used) for the duration of the COVID-19 declaration under Section 564(b)(1) of the Act, 21 U.S.C.section 360bbb-3(b)(1), unless the authorization is terminated  or revoked sooner.       Influenza A by PCR NEGATIVE NEGATIVE Final   Influenza B by PCR NEGATIVE NEGATIVE Final    Comment: (NOTE) The Xpert Xpress SARS-CoV-2/FLU/RSV plus assay is intended as an aid in the diagnosis of influenza from Nasopharyngeal swab specimens and should not be  used as a sole basis for treatment. Nasal washings and aspirates are unacceptable for Xpert Xpress SARS-CoV-2/FLU/RSV testing.  Fact Sheet for Patients: EntrepreneurPulse.com.au  Fact Sheet for Healthcare Providers: IncredibleEmployment.be  This test is not yet approved or cleared by the [place] FDA and has been authorized for detection and/or diagnosis of SARS-CoV-2 by FDA under an Emergency Use Authorization (EUA). This EUA will remain in effect (meaning this test can be used) for the duration of the COVID-19 declaration under Section 564(b)(1) of the Act, 21 U.S.C. section 360bbb-3(b)(1), unless the authorization is terminated or revoked.  Performed at [facility], [place] [address] [place], [place] [address]     Please note: You were cared for by a hospitalist during your hospital stay. Once you are discharged, your primary care physician will handle any further medical issues. Please note that NO REFILLS for any discharge medications will be authorized once you are discharged, as it is imperative that you return to your primary care physician (or establish a relationship with a primary care physician if you do not have one) for your post hospital discharge needs so that they can reassess your need for medications and monitor your lab values.    Time coordinating discharge: 40 minutes  SIGNED:   Shelly Coss, MD  [facility] 01/27/2021, 9:47 AM Pager [phone]  If 7PM-7AM, please contact night-coverage [email or web address] Password [email or web address]

## 2021-01-27 NOTE — Progress Notes (Addendum)
Manufacturing engineer [facility]) Hospital Liaison note.    Addendum: Pt has been deemed eligible for [facility].  Received request from [facility] for family interest in [facility] versus hospice support at [facility]. Chart and pt information under review by [facility] physician. [facility] eligibility pending at this time.  Met at bedside with pt's wife to discuss recent changes. Reviewed use of comfort medications to manage symptoms of pain and agitation.  Education offered on symptom management at end of life.  Contact information given.  [facility] is unable to offer a room today. Hospital Liaison will follow up tomorrow or sooner if a room becomes available. Please do not hesitate to call with questions.    Thank you for the opportunity to participate in this patient's care.  Domenic Moras, BSN, RN [facility] Liaison (listed on [place] under Hospice/[facility])    [phone] (24h on call)

## 2021-01-27 NOTE — Progress Notes (Signed)
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  Interval History: --unable to transfer to [facility] over the weekend --patient had a good day yesterday, but overnight had marked decline in clinical status with agitation and confusion --currently on diluadid drip with PRN ativan. Also has scopolamine patch with Robinul.  --wife at bedside --palliative care working on transfer to [facility].   O:   Vitals:   01/27/21 0521 01/27/21 1106  BP: (!) 154/65   Pulse: 88   Resp: 16   Temp: 98.9 F (37.2 C) 99.8 F (37.7 C)  SpO2: 94%    CBC Latest Ref Rng & Units 01/23/2021 01/25/2021 01/20/2021  WBC 4.0 - 10.5 K/uL 1.1(LL) 1.2(LL) 1.5(L)  Hemoglobin 13.0 - 17.0 g/dL 9.3(L) 9.1(L) 10.6(L)  Hematocrit 39.0 - 52.0 % 27.8(L) 27.1(L) 30.5(L)  Platelets 150 - 400 K/uL 181 191 205   CMP Latest Ref Rng & Units 01/23/2021 01/31/2021 01/20/2021  Glucose 70 - 99 mg/dL 110(H) 105(H) 123(H)  BUN 8 - 23 mg/dL 6(L) 7(L) 7(L)  Creatinine 0.61 - 1.24 mg/dL 0.54(L) 0.57(L) 0.68  Sodium 135 - 145 mmol/L 135 135 134(L)  Potassium 3.5 - 5.1 mmol/L 3.8 3.4(L) 3.4(L)  Chloride 98 - 111 mmol/L 102 102 99  CO2 22 - 32 mmol/L 26 26 27   Calcium 8.9 - 10.3 mg/dL 8.1(L) 8.0(L) 8.3(L)  Total Protein 6.5 - 8.1 g/dL 5.6(L) 5.4(L) 6.0(L)  Total Bilirubin 0.3 - 1.2 mg/dL 0.4 0.6 0.3  Alkaline Phos 38 - 126 U/L 83 89 126  AST 15 - 41 U/L 64(H) 56(H) 23  ALT 0 - 44 U/L 54(H) 45(H) 24    A/P:  Recommendations: --At this time I do not believe that there are any additional treatments that we would be able to offer.  His disease has been remarkably refractory to 4 lines of therapy. --previously had a long and detailed discussion with the patient and his wife regarding goals of care and options moving forward. -- Agree with referral to  hospice at [facility] --Pain management and palliative measures (ativan, robinul) per palliative care team --Oncology will continue to follow as needed  Ledell Peoples, MD Department of Hematology/Oncology [place] at [facility] Phone: [phone] Pager: [phone] Email: [email or web address]

## 2021-01-27 NOTE — TOC Initial Note (Signed)
Transition of Care [facility]) - Initial/Assessment Note    Patient Details  Name: Brian Castillo MRN: [id number] Date of Birth: 1940-04-03  Transition of Care [facility]) CM/SW Contact:    Lynnell Catalan, RN Phone Number: 01/27/2021, 11:26 AM  Clinical Narrative:                 [facility] consult for Residential Hospice. Spoke with wife at bedside as pt is lethargic. Wife offered choice of residential hospice facilities and [facility] chosen. [place] liaison contacted for referral.  Expected Discharge Plan: [place] Barriers to Discharge: No Barriers Identified   Patient Goals and CMS Choice     Choice offered to / list presented to : Spouse  Expected Discharge Plan and Services Expected Discharge Plan: [place]   Discharge Planning Services: CM Consult Post Acute Care Choice: Hospice Living arrangements for the past 2 months: [place] Expected Discharge Date: 01/27/21                     Prior Living Arrangements/Services Living arrangements for the past 2 months: [place] Lives with:: Spouse          Activities of Daily Living Home Assistive Devices/Equipment: Eyeglasses, Environmental consultant (specify type) ADL Screening (condition at time of admission) Patient's cognitive ability adequate to safely complete daily activities?: No Is the patient deaf or have difficulty hearing?: Yes (trouble hearing out of right ear - swollen and pressure, wife states Dr. Lorenso Courier said there is a lot of wax in right ear) Does the patient have difficulty seeing, even when wearing glasses/contacts?: No Does the patient have difficulty concentrating, remembering, or making decisions?: No Patient able to express need for assistance with ADLs?: Yes Does the patient have difficulty dressing or bathing?: No Independently performs ADLs?: No Communication: Independent Dressing (OT): Independent Grooming: Independent Feeding:  Independent Bathing: Independent Toileting: Needs assistance Is this a change from baseline?: Change from baseline, expected to last >3days In/Out Bed: Needs assistance Is this a change from baseline?: Change from baseline, expected to last >3 days Walks in Home: Needs assistance Is this a change from baseline?: Change from baseline, expected to last >3 days Does the patient have difficulty walking or climbing stairs?: No Weakness of Legs: Both Weakness of Arms/Hands: Both  Permission Sought/Granted                  Emotional Assessment Appearance:: Appears older than stated age Attitude/Demeanor/Rapport: Sedated Affect (typically observed): Agitated        Admission diagnosis:  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] Patient Active Problem List   Diagnosis Date Noted   Hospice care patient 01/24/2021   Palliative care patient 01/24/2021   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[place]) 01/18/2021   Severe neutropenia ([place]) 12/10/2020   Seizure disorder ([place]) 12/10/2020   Normocytic anemia 12/10/2020   Lung nodule 12/10/2020   Bilateral occipital neuralgia 11/05/2020   Neck pain 09/13/2020   Internal carotid artery stenosis, left 09/13/2020   Anemia 55/97/4163   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 AT [address] [place] [address] Phone: [phone] Fax: [phone]  Social Determinants of Health (SDOH) Interventions    Readmission Risk Interventions Readmission Risk Prevention Plan 01/27/2021 12/13/2020  Transportation Screening Complete Complete  PCP or Specialist Appt within 5-7 Days Complete -  PCP or Specialist Appt  within 3-5 Days - Complete  Home Care Screening Complete -  Medication Review (RN CM) Complete -  HRI or [place] - Complete  Social Work Consult for [place] Planning/Counseling - Complete  Palliative Care Screening - Not Applicable  Medication Review Press photographer) - Complete  Some recent data might be hidden

## 2021-01-27 NOTE — Progress Notes (Signed)
Daily Progress Note   Patient Name: Brian Castillo       Date: 01/27/2021 DOB: [birth year]  Age: 80 y.o. MRN#: [id number] Attending Physician: Shelly Coss, MD Primary Care Physician: Shon Baton, MD Admit Date: 01/14/2021  Reason for Consultation/Follow-up: Establishing goals of care, Non pain symptom management, Pain control, Psychosocial/spiritual support, and Terminal Care  Subjective: Chart Reviewed. Updates Received. Patient Assessed.   Unfortunately early this morning Brian Castillo became confused and very agitated. PRN ativan provided in addition to Tylenol suppository in the setting of 103 axillary temperature. He is afebrile this morning however on arrival patient is confused, agitated, reaching for things, tachycardic, with some respiratory distress. His wife is at the bedside tearful while trying to calm and re-direct patient. He does not follow commands or open his eyes.   RN notified and is preparing to administer PRN medications. Patient calm s/p ativan but continues to appear uncomfortable with audible congestion, moaning, and grimacing. Detailed discussion with Brian Castillo regarding sudden changes in his condition. Patient is no longer appropriate to discharge to Brian Castillo given symptom burden. Recommendations for residential hospice provided and explained. Wife verbalized understanding. Education provided on aggressive symptom management with a goal of comfort. Education provided on terminal agitation. Brian Castillo expressed agreement tearfully expressing she does not like to see her husband in previous state and does not wish for him to suffer. Emotional support and reassurance provided.   Patient calm and resting prior to me leaving bedside.   1140: Patient continues to show signs of agitation and pain despite use of PRN ativan and dilaudid. Discussions had with family with recommendations to initiate continuous drip and scheduled medications to gain a better level of comfort for  patient. Wife in agreement. Emotional support provided.   1545: Patient calm. Dilaudid drip infusing. Continues to have intermittent episodes of agitation and moaning. Will plan to adjust medications according.    Length of Stay: 5 days  Vital Signs: BP (!) 154/65 (BP Location: Right Arm)   Pulse 88   Temp 98.9 F (37.2 C) (Oral)   Resp 16   Ht 5\' 8"  (1.727 m)   Wt 73.8 kg   SpO2 94%   BMI 24.74 kg/m  SpO2: SpO2: 94 % O2 Device: O2 Device: Room Air O2 Flow Rate:    Physical Exam: Some respiratory distress noted, ill-appearing Tachypneic, audible congestion noted Diminished bilaterally, rhonchi Severe right-sided fungating facial mass extending into right side of mouth and across neck/throat area, dressing clean dry and intact AAOx3, mood appropriate                Palliative Care Assessment & Plan   Code Status: DNR  Goals of Care/Recommendations: Continue comfort focused care Given patient's significant decline. Recommendations now for residential hospice vs. Anticipated hospital death given symptom burden. Wife in full agreement with request for [facility]. (TOC orders and Alinda Sierras, RN/CM is arranging) Dilaudid drip with orders to titrate Dilaudid boluses via infusion Scheduled ativan for agitation Haldol as needed for agitation  Scheduled robinul for secretions Scopolamine patch for secretions RN to call for needs.   PMT will continue to support and follow.    Prognosis: Weeks  Discharge Planning: Hospice facility  Thank you for allowing the Palliative Medicine Team to assist in the care of this patient.  Time Total: 65 min.   Visit consisted of counseling and education dealing with the complex and emotionally intense issues of symptom management and palliative care in the setting of  serious and potentially life-threatening illness.Greater than 50%  of this time was spent counseling and coordinating care related to the above assessment and plan.  Alda Lea, AGPCNP-BC  Palliative Medicine Team [phone]

## 2021-01-27 NOTE — Progress Notes (Signed)
Patient became notably more confused shortly after shift change. He was only alert to self and had difficulty following commands. Patient tried continuously getting up to the bathroom and appeared to be agitated. Patient's wife was notified. He was given ativan. He also had a temp of 103 axillary. He was given a tylenol suppository for comfort/fever.  After interventions, patient is resting now and is calm. Wife is at bedside.Brian Castillo

## 2021-01-28 ENCOUNTER — Other Ambulatory Visit: Payer: Medicare Other

## 2021-01-28 ENCOUNTER — Ambulatory Visit: Payer: Medicare Other

## 2021-01-28 DIAGNOSIS — Z66 Do not resuscitate: Secondary | ICD-10-CM | POA: Diagnosis not present

## 2021-01-28 DIAGNOSIS — Z515 Encounter for palliative care: Secondary | ICD-10-CM | POA: Diagnosis not present

## 2021-01-28 DIAGNOSIS — C8331 Diffuse large B-cell lymphoma, lymph nodes of head, face, and neck: Secondary | ICD-10-CM | POA: Diagnosis not present

## 2021-01-28 DIAGNOSIS — M542 Cervicalgia: Secondary | ICD-10-CM | POA: Diagnosis not present

## 2021-01-29 ENCOUNTER — Encounter (HOSPITAL_BASED_OUTPATIENT_CLINIC_OR_DEPARTMENT_OTHER): Payer: Medicare Other | Admitting: Physician Assistant

## 2021-01-30 ENCOUNTER — Telehealth: Payer: Self-pay | Admitting: *Deleted

## 2021-01-30 NOTE — Telephone Encounter (Signed)
[name] pt's wife Arbie Cookey to offer condolences after he passed away on 2021-02-03.  No answer but was able to leave vm message for her. Encouraged her to call back if she needed to.

## 2021-02-04 ENCOUNTER — Other Ambulatory Visit: Payer: Medicare Other

## 2021-02-04 ENCOUNTER — Ambulatory Visit: Payer: Medicare Other | Admitting: Physician Assistant

## 2021-02-04 ENCOUNTER — Ambulatory Visit: Payer: Medicare Other

## 2021-02-10 ENCOUNTER — Ambulatory Visit: Payer: Medicare Other

## 2021-02-10 ENCOUNTER — Other Ambulatory Visit: Payer: Medicare Other

## 2021-02-13 NOTE — Progress Notes (Signed)
Palliative Note:   Patient resting comfortably in no distress. Unresponsive. Normal breathing pattern. Wife is at the bedside. She is appreciative of current state with no agitation. Son is on the way to provide support and she will plan to go home for some self-care and return later.   1245: Received call from RN patient is actively dying. Son is at the bedside. Wife has been called an is on the way. I arrived at the bedside to offer support. Patient passed away at 1242 in son, Nicki Reaper and his wife presence. Emotional support provided. Wife arrived at bedside. Emotional support provided. Allowed time for wife to express feelings while silently being present. Patient's daughter has been called and she is leaving work and on the way to the hospital. Emotional support provided.   Family expressed their sincere appreciation of all of the care and support during their hospital stay.   Time Total: 20 min.   Visit consisted of counseling and education dealing with the complex and emotionally intense issues of symptom management and palliative care in the setting of serious and potentially life-threatening illness.Greater than 50%  of this time was spent counseling and coordinating care related to the above assessment and plan.  Alda Lea, AGPCNP-BC  Palliative Medicine Team [phone]

## 2021-02-13 NOTE — Progress Notes (Incomplete)
Patient's family called me to the room that patient is not

## 2021-02-13 NOTE — Progress Notes (Signed)
Patient seen and examined the bedside this morning.  On full comfort care, appears overall comfortable, not in distress.  No new change in the medical management.  He is waiting for bed at residential hospice.  Discharge summary and orders are in.

## 2021-02-13 NOTE — Death Summary Note (Signed)
Death Summary  Brian Castillo GGY:[id number] DOB: 1940-11-28 DOA: 09-Feb-2021  PCP: Shon Baton, MD  Admit date: Feb 09, 2021 Date of Death: 02/15/2021 Time of Death: June 16, 1240    History of present illness:   Patient is a 80 year old male with H/o DLBCL getting monjuvi, last dose appear on 11/7 ,  who has had progressively worsening right neck mass and uncontrolled pain, direct admission from cancer center .  His recent CT imaging showed tumor progression.  Oncology, palliative care were following and recommended hospice approach.  After carefully discussing the goals of care with family, he was started on full comfort care.  Initially ,he was planned to be discharged to nursing facility with hospice but since he is significantly declined, we discuss goals of care again with family and planned to transfer him to residential hospice.  While waiting  for residential hospice bed,he peacefully passed away on 02/15/2021 at 12:42pm.  Final Diagnoses:  1.   Diffuse Large B cell lymphoma   The results of significant diagnostics from this hospitalization (including imaging, microbiology, ancillary and laboratory) are listed below for reference.    Significant Diagnostic Studies: CT SOFT TISSUE NECK W CONTRAST  Result Date: 01/23/2021 CLINICAL DATA:  Hematologic malignancy, surveillance EXAM: CT NECK WITH CONTRAST TECHNIQUE: Multidetector CT imaging of the neck was performed using the standard protocol following the bolus administration of intravenous contrast. CONTRAST:  12mL OMNIPAQUE IOHEXOL 350 MG/ML SOLN COMPARISON:  12/10/2020 FINDINGS: Redemonstrated right face and neck mass, measuring up to 12.3 x 8.2 x 8.4 cm (AP x TR x CC) (series 4, image 45 and series 7, image 69), previously 8.4 x 6.3 x 6.8 cm when remeasured similarly. The mass was previously noted to involve the right parotid and submandibular spaces, now invading the right masticator space, right buccal space, and right sublingual space. The  mass also invades the right mucosal pharyngeal space and visceral space of the neck (series 4, image 58 and series 7, image 77), involving the right aspect of the epiglottis (series 4, image 48) and larynx. The mass encases the right mandible (series 4, image 43) and right aspect of the hyoid (series 4, image 49), without definite osseous erosion. The right muscles of mastication and platysma are indistinct from the mass. The mass now abuts the right distal common carotid artery and encases proximal right internal and external carotid arteries (series 4, image 46), although they remain patent and without visible pseudoaneurysm. The right facial artery is no longer distinct as it traverses the mass. Within the mass there is a hypoenhancing collection measuring 8.0 x 5.4 x 6.7 cm (series 4, image 57 and series 7, image 69), previously 5.8 x 4.9 x 5.7 cm when remeasured similarly. Again noted is air within the lower density collection, and connection to the skin surface, which is also larger than on prior exam. Pharynx and larynx: Leftward deviation of the pharynx and larynx, with likely involvement of the right aspect of the epiglottis with effacement of the vallecula, as well as the right piriform recess, possibly extending into the right vocal fold. Salivary glands: The left submandibular gland and left parotid gland are unremarkable. The right salivary glands are instinct from the mass. Thyroid: Normal. Lymph nodes: Enlarged right level 1A lymph node (series 4, image 64), measuring to 1.4 cm, previously 0.8 cm. An additional right preauricular lymph node measures up to 1.3 cm in short axis (series 4, image 126), unchanged when remeasured similarly. The mass may be of lymph node  origin; no other right neck lymph nodes appear distinct from the mass. No lymphadenopathy in the left neck. Vascular: As described above, the mass abuts distal right common carotid artery (series 4, image 57) and encases the prox right  internal carotid artery and external carotid artery (series 4, image 47). Vasculature is otherwise patent. Limited intracranial: Negative. Visualized orbits: Negative. Mastoids and visualized paranasal sinuses: Clear. Skeleton: As described above, in case mint of the right mandible and right hyoid 5 mass. No evidence of osseous erosion. Degenerative changes in the cervical spine. No acute osseous abnormality. Upper chest: Previously noted ground-glass nodules in the right upper lobe have increased in size and density. The more superior nodule measures 1.4 x 2.2 cm (series 4, image 95), previously 0.8 x 0.9 cm. The more inferior right upper lobe lesion measures 2.4 x 1.5 cm (series 4, image 112), previously 0.7 x 0.8 cm. Additional less well-defined ground-glass and nodular opacities in the left upper lobe (series 4, images 116-121) and in the left lower lobe (series 4, image 121). IMPRESSION: 1. Significant interval progression of a trans spatial neck mass, now measuring up to 12.3 cm, with extensive necrosis extending to the skin surface. Mass likely involves the right muscles of mastication and platysma, as they are indistinct from the mass. 2. The mass encases the right ICA and ECA and abuts the distal right CCA. No definite evidence of pseudoaneurysm. 3. Invasion of the larynx and pharynx, including the right aspect of the epiglottis, with effacement of the vallecula and right piriform recess. Possible involvement of the right vocal fold. 4. Interval increase in the size of previously noted ground-glass nodules in the right upper lobe, with additional left upper and lower lobe ground-glass nodules, which could be lymphomatous. Consider chest CT for more complete evaluation. Electronically Signed   By: Merilyn Baba M.D.   On: 01/23/2021 12:11    Microbiology: Recent Results (from the past 240 hour(s))  Resp Panel by RT-PCR (Flu A&B, Covid) Nasopharyngeal Swab     Status: None   Collection Time: 02/02/2021   5:23 PM   Specimen: Nasopharyngeal Swab; Nasopharyngeal(NP) swabs in vial transport medium  Result Value Ref Range Status   SARS Coronavirus 2 by RT PCR NEGATIVE NEGATIVE Final    Comment: (NOTE) SARS-CoV-2 target nucleic acids are NOT DETECTED.  The SARS-CoV-2 RNA is generally detectable in upper respiratory specimens during the acute phase of infection. The lowest concentration of SARS-CoV-2 viral copies this assay can detect is 138 copies/mL. A negative result does not preclude SARS-Cov-2 infection and should not be used as the sole basis for treatment or other patient management decisions. A negative result may occur with  improper specimen collection/handling, submission of specimen other than nasopharyngeal swab, presence of viral mutation(s) within the areas targeted by this assay, and inadequate number of viral copies(<138 copies/mL). A negative result must be combined with clinical observations, patient history, and epidemiological information. The expected result is Negative.  Fact Sheet for Patients:  EntrepreneurPulse.com.au  Fact Sheet for Healthcare Providers:  IncredibleEmployment.be  This test is no t yet approved or cleared by the [place] FDA and  has been authorized for detection and/or diagnosis of SARS-CoV-2 by FDA under an Emergency Use Authorization (EUA). This EUA will remain  in effect (meaning this test can be used) for the duration of the COVID-19 declaration under Section 564(b)(1) of the Act, 21 U.S.C.section 360bbb-3(b)(1), unless the authorization is terminated  or revoked sooner.  Influenza A by PCR NEGATIVE NEGATIVE Final   Influenza B by PCR NEGATIVE NEGATIVE Final    Comment: (NOTE) The Xpert Xpress SARS-CoV-2/FLU/RSV plus assay is intended as an aid in the diagnosis of influenza from Nasopharyngeal swab specimens and should not be used as a sole basis for treatment. Nasal washings and aspirates  are unacceptable for Xpert Xpress SARS-CoV-2/FLU/RSV testing.  Fact Sheet for Patients: EntrepreneurPulse.com.au  Fact Sheet for Healthcare Providers: IncredibleEmployment.be  This test is not yet approved or cleared by the [place] FDA and has been authorized for detection and/or diagnosis of SARS-CoV-2 by FDA under an Emergency Use Authorization (EUA). This EUA will remain in effect (meaning this test can be used) for the duration of the COVID-19 declaration under Section 564(b)(1) of the Act, 21 U.S.C. section 360bbb-3(b)(1), unless the authorization is terminated or revoked.  Performed at [facility], [place] [address] [place], [place] [address]      Labs: Basic Metabolic Panel: Recent Labs  Lab 02/04/2021 1644 01/23/21 0502  NA 135 135  K 3.4* 3.8  CL 102 102  CO2 26 26  GLUCOSE 105* 110*  BUN 7* 6*  CREATININE 0.57* 0.54*  CALCIUM 8.0* 8.1*  MG 1.9  --   PHOS 3.0  --    Liver Function Tests: Recent Labs  Lab 01/21/2021 1644 01/23/21 0502  AST 56* 64*  ALT 45* 54*  ALKPHOS 89 83  BILITOT 0.6 0.4  PROT 5.4* 5.6*  ALBUMIN 2.6* 2.6*   No results for input(s): LIPASE, AMYLASE in the last 168 hours. No results for input(s): AMMONIA in the last 168 hours. CBC: Recent Labs  Lab 01/25/2021 1644 01/23/21 0502  WBC 1.2* 1.1*  NEUTROABS 0.2*  --   HGB 9.1* 9.3*  HCT 27.1* 27.8*  MCV 94.1 94.9  PLT 191 181   Cardiac Enzymes: No results for input(s): CKTOTAL, CKMB, CKMBINDEX, TROPONINI in the last 168 hours. D-Dimer No results for input(s): DDIMER in the last 72 hours. BNP: Invalid input(s): POCBNP CBG: No results for input(s): GLUCAP in the last 168 hours. Anemia work up No results for input(s): VITAMINB12, FOLATE, FERRITIN, TIBC, IRON, RETICCTPCT in the last 72 hours. Urinalysis    Component Value Date/Time   COLORURINE YELLOW 06/25/2020 1950   APPEARANCEUR CLEAR 06/25/2020 1950   LABSPEC  1.018 06/25/2020 1950   PHURINE 5.5 06/25/2020 1950   GLUCOSEU NEGATIVE 06/25/2020 1950   HGBUR LARGE (A) 06/25/2020 1950   BILIRUBINUR NEGATIVE 06/25/2020 1950   KETONESUR NEGATIVE 06/25/2020 1950   PROTEINUR TRACE (A) 06/25/2020 1950   UROBILINOGEN 0.2 03/06/2016 1251   NITRITE NEGATIVE 06/25/2020 1950   LEUKOCYTESUR NEGATIVE 06/25/2020 1950   Sepsis Labs Invalid input(s): PROCALCITONIN,  WBC,  LACTICIDVEN     SIGNED:  Shelly Coss, MD  [facility] 02/19/21, 3:20 PM Pager [phone]  If 7PM-7AM, please contact night-coverage [email or web address] Password [email or web address]

## 2021-02-13 DEATH — deceased

## 2021-02-17 ENCOUNTER — Inpatient Hospital Stay: Payer: Medicare Other

## 2021-03-06 NOTE — Progress Notes (Signed)
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 ([place]) Staging form: Hodgkin and Non-Hodgkin Lymphoma, AJCC 8th Edition - Clinical: Stage III (Follicular lymphoma) - Signed by Orson Slick, MD on 05/29/2020 Stage prefix: Initial diagnosis   Intent: Curative  Radiation Treatment Dates: 10/30/2020 through 12/18/2020 Site Technique Total Dose (Gy) Dose per Fx (Gy) Completed Fx Beam Energies  Neck: HN_right 3D 20/20 2 10/10 6X, 10X  Neck: HN_Bst_bilat IMRT 30/30 2 15/15 6X  Parotid, Right: HN_Rt_neck 3D 14.8/14.8 3.7 4/4 6X   Narrative: The patient tolerated radiation therapy relatively well but developed progressive disease outside the radiation fields. After completing his initial 25 fractions he received a QUAD SHOT regimen to the progressive disease to expedite sequential systemic therapy with Dr Lorenso Courier.  Plan: The patient will follow-up with radiation oncology in one half month. -----------------------------------  Eppie Gibson, MD

## 2023-02-21 IMAGING — PT NM PET TUM IMG RESTAG (PS) SKULL BASE T - THIGH
1 of 7 series · 1 of 25 positions shown · non-contrast
Comparison: Friday February, 2020.

CLINICAL DATA: Subsequent treatment strategy for follicular
lymphoma.

EXAM:
NUCLEAR MEDICINE PET SKULL BASE TO THIGH
TECHNIQUE: 8.7 mCi F-18 FDG was injected intravenously. Full-ring PET imaging
was performed from the skull base to thigh after the radiotracer. CT
data was obtained and used for attenuation correction and anatomic
localization.
Fasting blood glucose: 115 mg/dl

[Series 4: ct [id number]_sk_[id number] 5.0 bf37 · axial · 5.0mm · 0.98mm/px · 1 of 235 slices shown]
[im 235/235  brain]
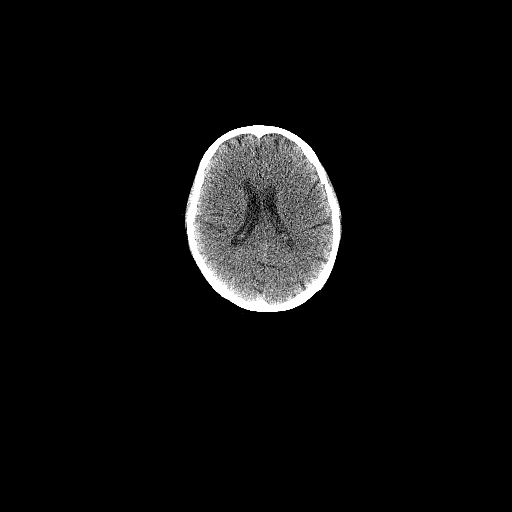

[1 of 25 positions shown; findings below may reference images not displayed]

FINDINGS: Mediastinal blood pool activity: SUV max

Liver activity: SUV max

NECK: RIGHT level IB lymph node has enlarged since the previous
study. This now measures approximately 3.8 x 5.0 cm with a maximum
SUV of 23.1 as compared to 14.2 on the prior study (image 37/4).

Lymph nodes in the LEFT neck have resolved. Previous lymph node at
the LEFT level II level that showed a maximum SUV of 10.3 posterior
to the sternocleidomastoid on the prior study is no longer visible.
No additional signs of nodal disease in the neck.

Incidental CT findings: none

CHEST: No hypermetabolic mediastinal or hilar nodes.

Scattered nodular foci show improvement since previous imaging.
There is a stable nodule in the LEFT mid chest (image 45/8) 7 mm
without significant FDG uptake maximum SUV less than 1.

Other scattered small nodules in the chest many of which have
resolved.

In an area that previously showed branching tree-in-bud changes
there is a focal nodule that measures 8 x 5 mm with a maximum SUV of
1.5. Airways are patent.

Incidental CT findings: Calcified atheromatous plaque of the
thoracic aorta. Normal heart size without pericardial effusion.
Normal caliber of the central pulmonary vessels. RIGHT-sided
Port-A-Cath terminates at the caval to atrial junction. Limited
assessment of cardiovascular structures given lack of intravenous
contrast. Esophagus mildly patulous.

ABDOMEN/PELVIS: Resolution of small lymph node in the gastrohepatic
recess otherwise negative appearance of the abdomen and pelvis, no
signs of FDG avid disease.

Incidental CT findings: Post cholecystectomy. Unremarkable
appearance of liver, spleen, pancreas, adrenal glands and kidneys.
Urinary bladder is collapsed. No hydronephrosis.

Stable haziness of the jejunal mesentery, remains nonspecific with
calcifications in the ileal mesentery. Calcified atheromatous plaque
of the abdominal aorta. No aneurysmal dilation. Prostate with
enlargement as before. Small fat containing umbilical hernia and
LEFT inguinal hernia.

SKELETON: No focal hypermetabolic activity to suggest skeletal
metastasis.

Incidental CT findings: Spinal degenerative changes.
IMPRESSION: [HOSPITAL] 5 changes with marked enlargement of submandibular lymph
node on the RIGHT with markedly increased metabolic activity over
previous imaging.

Resolution of LEFT neck lymph nodes and lymph node in the upper
abdomen. These areas are no longer visible.

No additional new or progressive findings.

Scattered pulmonary nodules favored to represent post infectious or
inflammatory changes. Attention on follow-up.

Stable haziness of jejunal mesentery. Attention on follow-up. Not
associated with adenopathy or significant hypermetabolism.
